# Patient Record
Sex: Male | Born: 1960 | Hispanic: No | Smoking: Never smoker
Health system: Southern US, Community
[De-identification: ages and names within clinical notes are randomized; demographics above are authoritative.]

## PROBLEM LIST (undated history)

## (undated) DIAGNOSIS — I2510 Atherosclerotic heart disease of native coronary artery without angina pectoris: Secondary | ICD-10-CM

## (undated) DIAGNOSIS — G459 Transient cerebral ischemic attack, unspecified: Secondary | ICD-10-CM

## (undated) DIAGNOSIS — H9192 Unspecified hearing loss, left ear: Secondary | ICD-10-CM

## (undated) DIAGNOSIS — G43109 Migraine with aura, not intractable, without status migrainosus: Secondary | ICD-10-CM

## (undated) DIAGNOSIS — R51 Headache: Secondary | ICD-10-CM

## (undated) DIAGNOSIS — K5790 Diverticulosis of intestine, part unspecified, without perforation or abscess without bleeding: Secondary | ICD-10-CM

## (undated) DIAGNOSIS — R002 Palpitations: Secondary | ICD-10-CM

## (undated) DIAGNOSIS — K644 Residual hemorrhoidal skin tags: Secondary | ICD-10-CM

## (undated) DIAGNOSIS — K648 Other hemorrhoids: Secondary | ICD-10-CM

## (undated) DIAGNOSIS — D126 Benign neoplasm of colon, unspecified: Secondary | ICD-10-CM

## (undated) DIAGNOSIS — R519 Headache, unspecified: Secondary | ICD-10-CM

## (undated) DIAGNOSIS — I639 Cerebral infarction, unspecified: Secondary | ICD-10-CM

## (undated) DIAGNOSIS — H539 Unspecified visual disturbance: Secondary | ICD-10-CM

## (undated) DIAGNOSIS — I10 Essential (primary) hypertension: Secondary | ICD-10-CM

## (undated) DIAGNOSIS — Z9289 Personal history of other medical treatment: Secondary | ICD-10-CM

## (undated) HISTORY — DX: Diverticulosis of intestine, part unspecified, without perforation or abscess without bleeding: K57.90

## (undated) HISTORY — DX: Palpitations: R00.2

## (undated) HISTORY — DX: Headache: R51

## (undated) HISTORY — DX: Headache, unspecified: R51.9

## (undated) HISTORY — DX: Residual hemorrhoidal skin tags: K64.4

## (undated) HISTORY — DX: Unspecified visual disturbance: H53.9

## (undated) HISTORY — DX: Benign neoplasm of colon, unspecified: D12.6

## (undated) HISTORY — DX: Personal history of other medical treatment: Z92.89

## (undated) HISTORY — DX: Essential (primary) hypertension: I10

## (undated) HISTORY — DX: Other hemorrhoids: K64.8

## (undated) HISTORY — DX: Cerebral infarction, unspecified: I63.9

---

## 1997-12-29 ENCOUNTER — Emergency Department (HOSPITAL_COMMUNITY): Admission: EM | Admit: 1997-12-29 | Discharge: 1997-12-29 | Payer: Self-pay | Admitting: Emergency Medicine

## 1998-07-17 ENCOUNTER — Encounter: Payer: Self-pay | Admitting: Emergency Medicine

## 1998-07-17 ENCOUNTER — Emergency Department (HOSPITAL_COMMUNITY): Admission: EM | Admit: 1998-07-17 | Discharge: 1998-07-17 | Payer: Self-pay | Admitting: Emergency Medicine

## 1998-07-18 ENCOUNTER — Ambulatory Visit (HOSPITAL_COMMUNITY): Admission: RE | Admit: 1998-07-18 | Discharge: 1998-07-18 | Payer: Self-pay | Admitting: *Deleted

## 1999-04-17 ENCOUNTER — Emergency Department (HOSPITAL_COMMUNITY): Admission: EM | Admit: 1999-04-17 | Discharge: 1999-04-17 | Payer: Self-pay | Admitting: Emergency Medicine

## 1999-04-17 ENCOUNTER — Encounter: Payer: Self-pay | Admitting: Emergency Medicine

## 1999-12-16 ENCOUNTER — Emergency Department (HOSPITAL_COMMUNITY): Admission: EM | Admit: 1999-12-16 | Discharge: 1999-12-16 | Payer: Self-pay | Admitting: Emergency Medicine

## 2001-05-28 ENCOUNTER — Emergency Department (HOSPITAL_COMMUNITY): Admission: EM | Admit: 2001-05-28 | Discharge: 2001-05-29 | Payer: Self-pay | Admitting: Emergency Medicine

## 2001-05-29 ENCOUNTER — Encounter: Payer: Self-pay | Admitting: Emergency Medicine

## 2004-02-21 ENCOUNTER — Emergency Department (HOSPITAL_COMMUNITY): Admission: EM | Admit: 2004-02-21 | Discharge: 2004-02-21 | Payer: Self-pay | Admitting: Family Medicine

## 2004-06-09 ENCOUNTER — Emergency Department (HOSPITAL_COMMUNITY): Admission: EM | Admit: 2004-06-09 | Discharge: 2004-06-09 | Payer: Self-pay | Admitting: Emergency Medicine

## 2005-01-19 ENCOUNTER — Emergency Department (HOSPITAL_COMMUNITY): Admission: EM | Admit: 2005-01-19 | Discharge: 2005-01-19 | Payer: Self-pay | Admitting: Emergency Medicine

## 2005-07-01 ENCOUNTER — Ambulatory Visit: Payer: Self-pay | Admitting: Family Medicine

## 2005-07-02 ENCOUNTER — Ambulatory Visit: Payer: Self-pay | Admitting: *Deleted

## 2005-08-19 ENCOUNTER — Ambulatory Visit (HOSPITAL_COMMUNITY): Admission: RE | Admit: 2005-08-19 | Discharge: 2005-08-19 | Payer: Self-pay | Admitting: Gastroenterology

## 2005-12-06 ENCOUNTER — Emergency Department (HOSPITAL_COMMUNITY): Admission: EM | Admit: 2005-12-06 | Discharge: 2005-12-06 | Payer: Self-pay | Admitting: Emergency Medicine

## 2005-12-21 ENCOUNTER — Emergency Department (HOSPITAL_COMMUNITY): Admission: EM | Admit: 2005-12-21 | Discharge: 2005-12-21 | Payer: Self-pay | Admitting: Emergency Medicine

## 2005-12-30 ENCOUNTER — Emergency Department (HOSPITAL_COMMUNITY): Admission: EM | Admit: 2005-12-30 | Discharge: 2005-12-30 | Payer: Self-pay | Admitting: Emergency Medicine

## 2006-01-05 ENCOUNTER — Ambulatory Visit: Payer: Self-pay | Admitting: Family Medicine

## 2006-11-10 ENCOUNTER — Emergency Department (HOSPITAL_COMMUNITY): Admission: EM | Admit: 2006-11-10 | Discharge: 2006-11-10 | Payer: Self-pay | Admitting: Emergency Medicine

## 2006-12-09 ENCOUNTER — Encounter (INDEPENDENT_AMBULATORY_CARE_PROVIDER_SITE_OTHER): Payer: Self-pay | Admitting: *Deleted

## 2007-02-08 ENCOUNTER — Emergency Department (HOSPITAL_COMMUNITY): Admission: EM | Admit: 2007-02-08 | Discharge: 2007-02-08 | Payer: Self-pay | Admitting: Emergency Medicine

## 2007-02-10 ENCOUNTER — Emergency Department (HOSPITAL_COMMUNITY): Admission: EM | Admit: 2007-02-10 | Discharge: 2007-02-10 | Payer: Self-pay | Admitting: Family Medicine

## 2007-02-14 ENCOUNTER — Emergency Department (HOSPITAL_COMMUNITY): Admission: EM | Admit: 2007-02-14 | Discharge: 2007-02-14 | Payer: Self-pay | Admitting: Emergency Medicine

## 2007-02-17 ENCOUNTER — Emergency Department (HOSPITAL_COMMUNITY): Admission: EM | Admit: 2007-02-17 | Discharge: 2007-02-17 | Payer: Self-pay | Admitting: Emergency Medicine

## 2008-02-23 ENCOUNTER — Emergency Department (HOSPITAL_COMMUNITY): Admission: EM | Admit: 2008-02-23 | Discharge: 2008-02-24 | Payer: Self-pay | Admitting: Emergency Medicine

## 2008-06-08 ENCOUNTER — Emergency Department (HOSPITAL_COMMUNITY): Admission: EM | Admit: 2008-06-08 | Discharge: 2008-06-08 | Payer: Self-pay | Admitting: Emergency Medicine

## 2009-05-08 ENCOUNTER — Emergency Department (HOSPITAL_COMMUNITY): Admission: EM | Admit: 2009-05-08 | Discharge: 2009-05-08 | Payer: Self-pay | Admitting: Emergency Medicine

## 2009-12-02 ENCOUNTER — Emergency Department (HOSPITAL_COMMUNITY): Admission: EM | Admit: 2009-12-02 | Discharge: 2009-12-02 | Payer: Self-pay | Admitting: Emergency Medicine

## 2010-06-14 LAB — URINALYSIS, ROUTINE W REFLEX MICROSCOPIC
Bilirubin Urine: NEGATIVE
Glucose, UA: NEGATIVE mg/dL
Hgb urine dipstick: NEGATIVE
Ketones, ur: NEGATIVE mg/dL
Nitrite: NEGATIVE
Protein, ur: NEGATIVE mg/dL
Specific Gravity, Urine: 1.03 (ref 1.005–1.030)
Urobilinogen, UA: 0.2 mg/dL (ref 0.0–1.0)
pH: 5.5 (ref 5.0–8.0)

## 2010-06-14 LAB — GC/CHLAMYDIA PROBE AMP, GENITAL
Chlamydia, DNA Probe: NEGATIVE
GC Probe Amp, Genital: NEGATIVE

## 2010-06-14 LAB — RPR: RPR Ser Ql: NONREACTIVE

## 2010-07-04 LAB — CBC
HCT: 44.2 % (ref 39.0–52.0)
Hemoglobin: 15.5 g/dL (ref 13.0–17.0)
MCHC: 35.1 g/dL (ref 30.0–36.0)
MCV: 88 fL (ref 78.0–100.0)
Platelets: 233 10*3/uL (ref 150–400)
RBC: 5.02 MIL/uL (ref 4.22–5.81)
RDW: 12.5 % (ref 11.5–15.5)
WBC: 9.2 10*3/uL (ref 4.0–10.5)

## 2010-07-04 LAB — POCT I-STAT, CHEM 8
BUN: 19 mg/dL (ref 6–23)
Calcium, Ion: 1.11 mmol/L — ABNORMAL LOW (ref 1.12–1.32)
Chloride: 106 mEq/L (ref 96–112)
Creatinine, Ser: 1.1 mg/dL (ref 0.4–1.5)
Glucose, Bld: 108 mg/dL — ABNORMAL HIGH (ref 70–99)
HCT: 47 % (ref 39.0–52.0)
Hemoglobin: 16 g/dL (ref 13.0–17.0)
Potassium: 3.8 mEq/L (ref 3.5–5.1)
Sodium: 141 mEq/L (ref 135–145)
TCO2: 26 mmol/L (ref 0–100)

## 2010-07-04 LAB — DIFFERENTIAL
Basophils Absolute: 0 10*3/uL (ref 0.0–0.1)
Basophils Relative: 0 % (ref 0–1)
Eosinophils Absolute: 0.1 10*3/uL (ref 0.0–0.7)
Eosinophils Relative: 2 % (ref 0–5)
Lymphocytes Relative: 27 % (ref 12–46)
Lymphs Abs: 2.4 10*3/uL (ref 0.7–4.0)
Monocytes Absolute: 0.7 10*3/uL (ref 0.1–1.0)
Monocytes Relative: 7 % (ref 3–12)
Neutro Abs: 5.9 10*3/uL (ref 1.7–7.7)
Neutrophils Relative %: 64 % (ref 43–77)

## 2010-07-04 LAB — POCT CARDIAC MARKERS
CKMB, poc: <1 ng/mL — ABNORMAL LOW (ref 1.0–8.0)
Myoglobin, poc: 76.9 ng/mL (ref 12–200)
Troponin i, poc: <0.05 ng/mL (ref 0.00–0.09)

## 2010-08-09 NOTE — Op Note (Signed)
NAME:  Todd [name], Todd [name]                 ACCOUNT NO.:  [id number]   MEDICAL RECORD NO.:  [id number]          PATIENT TYPE:  AMB   LOCATION:  ENDO                         FACILITY:  [facility]   PHYSICIAN:  Graylin Shiver, M.D.   DATE OF BIRTH:  June 05, 1960   DATE OF PROCEDURE:  08/19/2005  DATE OF DISCHARGE:                                 OPERATIVE REPORT   PROCEDURE:  Colonoscopy   INDICATIONS FOR PROCEDURE:  Rectal bleeding.   INFORMED CONSENT:  Obtained after explanation of the risks of bleeding,  infection, and perforation.   PREMEDICATION:  Fentanyl 80 mcg IV, Versed 9 mg IV.   DESCRIPTION OF PROCEDURE:  With the patient in the left lateral decubitus  position a rectal exam was performed.  No masses were felt in the rectal  vault.  There were some external hemorrhoids seen.  The Olympus colonoscope  was inserted into the rectum, and advanced around the colon to the cecum.  Cecal landmarks were identified.  The cecum and ascending colon were normal.  The transverse colon normal.  The descending colon, sigmoid, and rectum were  normal.  The scope was retroflexed in the rectum and no abnormalities were  seen.  The scope was straightened and brought out.  He tolerated the  procedure well without complications.   IMPRESSION:  Normal colonoscopy to the cecum with external hemorrhoids  present, which I believe are the source of the patient's rectal bleeding   Will recommend Metamucil and an Anusol, or some other over-the-counter cream  as needed for hemorrhoids.  Should the problem continue that the bleeding  occurs; and is to troublesome for the patient; he will most likely need to  see a Careers adviser.           ______________________________  Graylin Shiver, M.D.     SFG/MEDQ  D:  08/19/2005  T:  08/19/2005  Job:  [id number]   cc:   Maurice March, M.D.  Fax: [phone]

## 2011-01-03 LAB — POCT CARDIAC MARKERS
CKMB, poc: <1 — ABNORMAL LOW
Myoglobin, poc: 55.6
Operator id: 279831
Troponin i, poc: <0.05

## 2011-07-01 ENCOUNTER — Emergency Department (HOSPITAL_COMMUNITY)
Admission: EM | Admit: 2011-07-01 | Discharge: 2011-07-01 | Disposition: A | Discharge status: Home / Self Care | Payer: Self-pay | Attending: Emergency Medicine | Admitting: Emergency Medicine

## 2011-07-01 ENCOUNTER — Encounter (HOSPITAL_COMMUNITY): Payer: Self-pay | Admitting: Emergency Medicine

## 2011-07-01 DIAGNOSIS — J040 Acute laryngitis: Secondary | ICD-10-CM

## 2011-07-01 LAB — RAPID STREP SCREEN (MED CTR MEBANE ONLY): Streptococcus, Group A Screen (Direct): NEGATIVE

## 2011-07-01 MED ORDER — HYDROCODONE-ACETAMINOPHEN 7.5-325 MG/15ML PO SOLN: 15.0000 mL | Freq: Three times a day (TID) | ORAL | Status: AC | PRN

## 2011-07-01 MED ORDER — OXYMETAZOLINE HCL 0.05 % NA SOLN
1.0000 | Freq: Once | NASAL | Status: AC
  Administered 2011-07-01: 1 via NASAL
  Filled 2011-07-01: qty 15

## 2011-07-01 NOTE — ED Notes (Signed)
Sore throat X3 days.  Denies fever, N/V.  Pt states he had a nosebleed on rt side this morning but bleeding is now controlled.  Dried blood evident in rt nare.  Pt alert oriented X4.

## 2011-07-01 NOTE — ED Notes (Signed)
Pt presents w/ 1-2days sore throat, hoarseness and this morning began having a right nare epistaxis - bleeding controlled at present, pt denies any known fever. Pt in no acute distress, resting comfortably on bed.

## 2011-07-01 NOTE — ED Notes (Signed)
Pt resting, no needs at this time.

## 2011-07-01 NOTE — ED Provider Notes (Signed)
History     CSN: [id number]  Arrival date & time 07/01/11  0453   First MD Initiated Contact with Patient 07/01/11 [phone]      Chief Complaint  Patient presents with  . Sore Throat    (Consider location/radiation/quality/duration/timing/severity/associated sxs/prior treatment) HPI Comments: 51 year old male with no significant past medical history presents with sore throat and a hoarse voice with a runny nose and chills that started yesterday. Symptoms are persistent, moderate, gradually getting worse, not associated with fevers, cough, chest pain, abdominal pain, headache, ear pain.  Patient is a 51 y.o. male presenting with pharyngitis. The history is provided by the patient.  Sore Throat    History reviewed. No pertinent past medical history.  No past surgical history on file.  No family history on file.  History  Substance Use Topics  . Smoking status: Never Smoker   . Smokeless tobacco: Not on file  . Alcohol Use: No      Review of Systems  All other systems reviewed and are negative.    Allergies  Wellbutrin  Home Medications   Current Outpatient Rx  Name Route Sig Dispense Refill  . MENTHOL 3 MG MT LOZG Oral Take 1 lozenge by mouth every 2 (two) hours as needed. For sore throat    . HYDROCODONE-ACETAMINOPHEN 7.5-325 MG/15ML PO SOLN Oral Take 15 mLs by mouth every 8 (eight) hours as needed for pain. 120 mL 0    BP 111/62  Pulse 80  Temp(Src) 99.3 F (37.4 C) (Oral)  Resp 14  SpO2 96%  Physical Exam  Nursing note and vitals reviewed. Constitutional: He appears well-developed and well-nourished. No distress.  HENT:  Head: Normocephalic and atraumatic.  Mouth/Throat: Oropharynx is clear and moist. No oropharyngeal exudate.       Oropharynx is clear, mild erythema but no exudate hypertrophy or asymmetry. Tympanic membranes normal bilaterally, bilateral nares with no signs of bleeding  Eyes: Conjunctivae and EOM are normal. Pupils are equal, round, and  reactive to light. Right eye exhibits no discharge. Left eye exhibits no discharge. No scleral icterus.  Neck: Normal range of motion. Neck supple. No JVD present. No thyromegaly present.  Cardiovascular: Normal rate, regular rhythm, normal heart sounds and intact distal pulses.  Exam reveals no gallop and no friction rub.   No murmur heard. Pulmonary/Chest: Effort normal and breath sounds normal. No respiratory distress. He has no wheezes. He has no rales.  Abdominal: Soft. Bowel sounds are normal. He exhibits no distension and no mass. There is no tenderness.  Musculoskeletal: Normal range of motion. He exhibits no edema and no tenderness.  Lymphadenopathy:    He has no cervical adenopathy.  Neurological: He is alert. Coordination normal.  Skin: Skin is warm and dry. No rash noted. No erythema.  Psychiatric: He has a normal mood and affect. His behavior is normal.    ED Course  Procedures (including critical care time)   Labs Reviewed  RAPID STREP SCREEN   No results found.   1. Laryngitis       MDM  Patient is a hoarse voice but no other signs of respiratory distress, normal vital signs, strep test sent, anticipate this is likely uncomplicated laryngitis, patient well appearing and amenable to discharge.   Strep neg  Discharge Prescriptions include:  Hydrocodone suspension  Vida Roller, MD 07/01/11 [phone]

## 2011-07-01 NOTE — ED Notes (Signed)
PT. REPORTS SORE THROAT / HOARSE VOICE WITH RUNNY NOSE ( BLOODY MUCUS) AND CHILLS ONSET YESTERDAY.

## 2011-07-05 ENCOUNTER — Ambulatory Visit: Payer: Self-pay | Admitting: Physician Assistant

## 2011-07-05 VITALS — BP 105/72 | HR 88 | Temp 98.4°F | Resp 16 | Ht 65.0 in | Wt 157.0 lb

## 2011-07-05 DIAGNOSIS — R05 Cough: Secondary | ICD-10-CM

## 2011-07-05 DIAGNOSIS — R059 Cough, unspecified: Secondary | ICD-10-CM

## 2011-07-05 MED ORDER — AZITHROMYCIN 250 MG PO TABS: ORAL_TABLET | ORAL | Status: AC

## 2011-07-05 NOTE — Progress Notes (Signed)
  Subjective:    Patient ID: Todd [name], male    DOB: 1960-09-10, 51 y.o.   MRN: [id number]  HPI  51yo male c/o ST X1 week ago, went to ER, negative strep test.  He was given pain meds.  The throat has improved, but he has developed a cough over the last few days which seems to be worsening.  No OTCs. Coughed a;; night last night.  Phlegm is yellow.    Review of Systems  All other systems reviewed and are negative.       Objective:   Physical Exam  Nursing note and vitals reviewed. Constitutional: He is oriented to person, place, and time. He appears well-developed and well-nourished.       ER note reviewed.  HENT:  Head: Normocephalic and atraumatic.  Right Ear: External ear normal.  Left Ear: External ear normal.  Mouth/Throat: Oropharynx is clear and moist. No oropharyngeal exudate (throat w PND and minimal erythema).  Neck: Normal range of motion. Neck supple.  Cardiovascular: Normal rate, regular rhythm and normal heart sounds.  Exam reveals no gallop and no friction rub.   No murmur heard. Pulmonary/Chest: Effort normal and breath sounds normal.  Lymphadenopathy:    He has no cervical adenopathy.  Neurological: He is alert and oriented to person, place, and time. No cranial nerve deficit.  Skin: Skin is warm and dry.      Assessment & Plan:  URI-?worsening.  Advised mucinex DM. Sent Zpack, wait 48 hrs.

## 2011-07-05 NOTE — Patient Instructions (Signed)
Fluids, rest and mucinex DM.

## 2011-09-11 ENCOUNTER — Ambulatory Visit: Payer: Self-pay

## 2011-09-14 ENCOUNTER — Ambulatory Visit (INDEPENDENT_AMBULATORY_CARE_PROVIDER_SITE_OTHER): Payer: BC Managed Care – PPO | Admitting: Emergency Medicine

## 2011-09-14 VITALS — BP 114/79 | HR 79 | Temp 98.4°F | Resp 16 | Ht 65.0 in | Wt 163.0 lb

## 2011-09-14 DIAGNOSIS — R002 Palpitations: Secondary | ICD-10-CM

## 2011-09-14 NOTE — Progress Notes (Signed)
  Subjective:    Patient ID: Todd [name], male    DOB: 16-Feb-1961, 51 y.o.   MRN: [id number]  HPI patient was seen here 3 years ago at that time he was having trouble with heart palpitations. Because of this he had a cardiac evaluation. He ended up having a stress Cardiolite which was normal. Had an echocardiogram which was normal. His tests were all normal and patient was told at that time he did not have any signs of cardiac disease. Since then he has done fine. He occasionally has a skipped beat but otherwise has been doing well. He denies any chest pain or shortness of breath. He is trying to get life insurance but is unable to because of his history of cardiac problems. He does not take a stimulant meds. He does not smoke or drink and has a healthy lifestyle to    Review of Systems     Objective:   Physical Exam HEENT exam is within normal limits the his neck is supple. His chest is clear to auscultation and percussion abdomen is soft and nontender.   EKG  NSR     Assessment & Plan:  Patient seen today EKG done shows no change from previous. He was evaluated 3 years ago at that time had no signs of cardiac disease

## 2011-09-14 NOTE — Patient Instructions (Signed)
Patient advised that some time due to his age he should schedule a routine physical exam. On his cardiac evaluation 3 years ago he  showed no signs of any cardiac disease.

## 2011-09-21 ENCOUNTER — Telehealth: Payer: Self-pay

## 2011-09-21 NOTE — Telephone Encounter (Signed)
LMOM to CB. 

## 2011-09-21 NOTE — Telephone Encounter (Signed)
Spoke with patient and was notified that EKG from past was the same as recent EKG.  Patient understood.

## 2011-09-21 NOTE — Telephone Encounter (Signed)
PT WANTS A COMPARISON OF HIS RECENT TESTS PERFORMED AT THE HOSPITAL.  HE ALSO, HAD THIS DONE THREE YEARS AGO.  PLEASE CALL [phone]

## 2011-09-24 ENCOUNTER — Telehealth: Payer: Self-pay

## 2011-09-24 NOTE — Telephone Encounter (Signed)
Pt is calling and would like to speak with someone concerning some test that were done we referred him to a heart specialist [phone]

## 2011-09-24 NOTE — Telephone Encounter (Signed)
Patient called wanting a result of his tests that he had done at the heart doctor "echocardiogram" .  I do not see it in his chart.  Have you received these results yet and if so, can we send him a copy?

## 2011-09-25 NOTE — Telephone Encounter (Signed)
Spoke w/pt and advised him that the cardiologist office where the procedure was performed should be in touch w/him w/results and suggested that pt call them and ask them for the results and if any f/up is needed, and for a copy of the report. Pt agreed to talk w/them tomorrow when they are open.

## 2011-09-25 NOTE — Telephone Encounter (Signed)
He needs to call the cardiologist where he had his procedure performed. Ask him to send me a copy of the echocardiogram and also have the patient request a copy be sent to him.

## 2011-10-12 ENCOUNTER — Ambulatory Visit (INDEPENDENT_AMBULATORY_CARE_PROVIDER_SITE_OTHER): Payer: BC Managed Care – PPO | Admitting: Family Medicine

## 2011-10-12 VITALS — BP 112/70 | HR 87 | Temp 98.6°F | Resp 12 | Ht 64.0 in | Wt 162.0 lb

## 2011-10-12 DIAGNOSIS — N469 Male infertility, unspecified: Secondary | ICD-10-CM

## 2011-10-12 NOTE — Progress Notes (Signed)
  [facility]:  Office Visit  Chief Complaint:  Chief Complaint  Patient presents with  . needs referral    HPI: Todd [name] is a 51 y.o. male who complains of trying  to have children for last 2 years. He denies having had any STDs, family history of infertility ( brothers all have multiple children). He went to [facility] ? And was evaluated for this and was told his "----was 0" but patient does not know what lab was zero. His wife is currently out of the country for the next 2 months. She has never been tested or evaluated for infertility issues.   History reviewed. No pertinent past medical history. History reviewed. No pertinent past surgical history. History   Social History  . Marital Status: Legally Separated    Spouse Name: N/A    Number of Children: N/A  . Years of Education: N/A   Social History Main Topics  . Smoking status: Never Smoker   . Smokeless tobacco: None  . Alcohol Use: No  . Drug Use: No  . Sexually Active: None   Other Topics Concern  . None   Social History Narrative  . None   Family History  Problem Relation Age of Onset  . Hypertension Mother   . Hypertension Father    Allergies  Allergen Reactions  . Wellbutrin (Bupropion Hcl) Itching   Prior to Admission medications   Medication Sig Start Date End Date Taking? Authorizing Provider  menthol-cetylpyridinium (CEPACOL) 3 MG lozenge Take 1 lozenge by mouth every 2 (two) hours as needed. For sore throat    Historical Provider, MD     ROS: The patient denies fevers, chills, night sweats, unintentional weight loss, chest pain, palpitations, wheezing, dyspnea on exertion, nausea, vomiting, abdominal pain, dysuria, hematuria, melena, numbness, weakness, or tingling.  All other systems have been reviewed and were otherwise negative with the exception of those mentioned in the HPI and as above.    PHYSICAL EXAM: Filed Vitals:   10/12/11 1427  BP: 112/70  Pulse: 87    Temp: 98.6 F (37 C)  Resp: 12   Filed Vitals:   10/12/11 1427  Height: 5\' 4"  (1.626 m)  Weight: 162 lb (73.483 kg)   Body mass index is 27.81 kg/(m^2).  General: Alert, no acute distress HEENT:  Normocephalic, atraumatic, oropharynx patent.  Cardiovascular:  Regular rate and rhythm,  No pedal edema.  Respiratory: Clear to auscultation bilaterally.   No cyanosis, no use of accessory musculature GI: No organomegaly, abdomen is soft and non-tender, positive bowel sounds.  No masses. Skin: No rashes. Neurologic: Facial musculature symmetric. Psychiatric: Patient is appropriate throughout our interaction. Lymphatic: No cervical lymphadenopathy Musculoskeletal: Gait intact.   LABS: Results for orders placed during the hospital encounter of 07/01/11  RAPID STREP SCREEN      Component Value Range   Streptococcus, Group A Screen (Direct) NEGATIVE  NEGATIVE     EKG/XRAY:   Primary read interpreted by Dr. Conley Rolls at [facility].   ASSESSMENT/PLAN: Encounter Diagnosis  Name Primary?  . 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 Patient advise to bring all labs that he had gotten done at [facility] to his appt.     Jenayah Antu PHUONG, DO 10/12/2011 3:13 PM

## 2011-12-01 ENCOUNTER — Encounter (HOSPITAL_COMMUNITY): Payer: Self-pay

## 2011-12-01 ENCOUNTER — Observation Stay (HOSPITAL_COMMUNITY)
Admission: EM | Admit: 2011-12-01 | Discharge: 2011-12-03 | Disposition: A | Discharge status: Home / Self Care | Payer: BC Managed Care – PPO | Attending: General Surgery | Admitting: General Surgery

## 2011-12-01 DIAGNOSIS — K3580 Unspecified acute appendicitis: Secondary | ICD-10-CM

## 2011-12-01 DIAGNOSIS — R1031 Right lower quadrant pain: Principal | ICD-10-CM | POA: Insufficient documentation

## 2011-12-01 HISTORY — PX: APPENDECTOMY: SHX54

## 2011-12-01 LAB — URINALYSIS, ROUTINE W REFLEX MICROSCOPIC
Bilirubin Urine: NEGATIVE
Glucose, UA: NEGATIVE mg/dL
Hgb urine dipstick: NEGATIVE
Leukocytes, UA: NEGATIVE
Nitrite: NEGATIVE
Protein, ur: NEGATIVE mg/dL
Specific Gravity, Urine: 1.031 — ABNORMAL HIGH (ref 1.005–1.030)
Urobilinogen, UA: 0.2 mg/dL (ref 0.0–1.0)
pH: 5.5 (ref 5.0–8.0)

## 2011-12-01 LAB — CBC WITH DIFFERENTIAL/PLATELET
Basophils Absolute: 0 10*3/uL (ref 0.0–0.1)
Basophils Relative: 0 % (ref 0–1)
Eosinophils Absolute: 0.2 10*3/uL (ref 0.0–0.7)
Eosinophils Relative: 2 % (ref 0–5)
HCT: 42.5 % (ref 39.0–52.0)
Hemoglobin: 15.1 g/dL (ref 13.0–17.0)
Lymphocytes Relative: 20 % (ref 12–46)
Lymphs Abs: 2.3 10*3/uL (ref 0.7–4.0)
MCH: 30 pg (ref 26.0–34.0)
MCHC: 35.5 g/dL (ref 30.0–36.0)
MCV: 84.3 fL (ref 78.0–100.0)
Monocytes Absolute: 0.7 10*3/uL (ref 0.1–1.0)
Monocytes Relative: 6 % (ref 3–12)
Neutro Abs: 8.2 10*3/uL — ABNORMAL HIGH (ref 1.7–7.7)
Neutrophils Relative %: 72 % (ref 43–77)
Platelets: 212 10*3/uL (ref 150–400)
RBC: 5.04 MIL/uL (ref 4.22–5.81)
RDW: 12.7 % (ref 11.5–15.5)
WBC: 11.4 10*3/uL — ABNORMAL HIGH (ref 4.0–10.5)

## 2011-12-01 NOTE — ED Notes (Signed)
Pt states he ate a stuffed green pepper for dinner last night and states he went to bed and awoke at 0400 this AM with moderate progressive mid abdominal pain-states 3 loose stools today.  Pt is pale and states the pain is "making me weak"-pt states he ate 5 ounces of meat today and a piece of bread.  Pt states his wife gave him some oregano water for his stomach which did not help the pain.

## 2011-12-02 ENCOUNTER — Encounter (HOSPITAL_COMMUNITY): Admission: EM | Disposition: A | Discharge status: Home / Self Care | Payer: Self-pay | Source: Home / Self Care

## 2011-12-02 ENCOUNTER — Encounter (HOSPITAL_COMMUNITY): Payer: Self-pay | Admitting: Registered Nurse

## 2011-12-02 ENCOUNTER — Emergency Department (HOSPITAL_COMMUNITY): Payer: BC Managed Care – PPO

## 2011-12-02 ENCOUNTER — Emergency Department (HOSPITAL_COMMUNITY): Payer: BC Managed Care – PPO | Admitting: Registered Nurse

## 2011-12-02 ENCOUNTER — Encounter (HOSPITAL_COMMUNITY): Payer: Self-pay

## 2011-12-02 DIAGNOSIS — K3580 Unspecified acute appendicitis: Secondary | ICD-10-CM

## 2011-12-02 HISTORY — PX: LAPAROSCOPIC APPENDECTOMY: SHX408

## 2011-12-02 LAB — COMPREHENSIVE METABOLIC PANEL
ALT: 16 U/L (ref 0–53)
AST: 19 U/L (ref 0–37)
Albumin: 4.2 g/dL (ref 3.5–5.2)
Alkaline Phosphatase: 53 U/L (ref 39–117)
BUN: 12 mg/dL (ref 6–23)
CO2: 29 mEq/L (ref 19–32)
Calcium: 9.6 mg/dL (ref 8.4–10.5)
Chloride: 103 mEq/L (ref 96–112)
Creatinine, Ser: 0.98 mg/dL (ref 0.50–1.35)
GFR calc Af Amer: >90 mL/min (ref >90)
GFR calc non Af Amer: >90 mL/min (ref >90)
Glucose, Bld: 97 mg/dL (ref 70–99)
Potassium: 4.1 mEq/L (ref 3.5–5.1)
Sodium: 138 mEq/L (ref 135–145)
Total Bilirubin: 1.4 mg/dL — ABNORMAL HIGH (ref 0.3–1.2)
Total Protein: 7.2 g/dL (ref 6.0–8.3)

## 2011-12-02 LAB — LIPASE, BLOOD: Lipase: 26 U/L (ref 11–59)

## 2011-12-02 SURGERY — APPENDECTOMY, LAPAROSCOPIC
Anesthesia: General | Site: Abdomen | Wound class: Contaminated

## 2011-12-02 MED ORDER — BUPIVACAINE HCL 0.25 % IJ SOLN
INTRAMUSCULAR | Status: DC | PRN
  Administered 2011-12-02: 15 mL

## 2011-12-02 MED ORDER — LIDOCAINE HCL (CARDIAC) 20 MG/ML IV SOLN
INTRAVENOUS | Status: DC | PRN
  Administered 2011-12-02: 50 mg via INTRAVENOUS

## 2011-12-02 MED ORDER — BUPIVACAINE HCL (PF) 0.25 % IJ SOLN
INTRAMUSCULAR | Status: AC
  Filled 2011-12-02: qty 30

## 2011-12-02 MED ORDER — KETOROLAC TROMETHAMINE 30 MG/ML IJ SOLN: 15.0000 mg | Freq: Once | INTRAMUSCULAR | Status: DC | PRN

## 2011-12-02 MED ORDER — ONDANSETRON HCL 4 MG/2ML IJ SOLN
4.0000 mg | Freq: Once | INTRAMUSCULAR | Status: AC
  Administered 2011-12-02: 4 mg via INTRAVENOUS
  Filled 2011-12-02: qty 2

## 2011-12-02 MED ORDER — HYDROMORPHONE HCL PF 1 MG/ML IJ SOLN
0.2500 mg | INTRAMUSCULAR | Status: DC | PRN
  Administered 2011-12-02 (×2): 0.5 mg via INTRAVENOUS

## 2011-12-02 MED ORDER — MIDAZOLAM HCL 5 MG/5ML IJ SOLN
INTRAMUSCULAR | Status: DC | PRN
  Administered 2011-12-02: 2 mg via INTRAVENOUS

## 2011-12-02 MED ORDER — LACTATED RINGERS IR SOLN
Status: DC | PRN
  Administered 2011-12-02: 100 mL

## 2011-12-02 MED ORDER — NEOSTIGMINE METHYLSULFATE 1 MG/ML IJ SOLN
INTRAMUSCULAR | Status: DC | PRN
  Administered 2011-12-02: 3 mg via INTRAVENOUS

## 2011-12-02 MED ORDER — MORPHINE SULFATE 4 MG/ML IJ SOLN
4.0000 mg | Freq: Once | INTRAMUSCULAR | Status: DC
  Filled 2011-12-02: qty 1

## 2011-12-02 MED ORDER — SUFENTANIL CITRATE 50 MCG/ML IV SOLN
INTRAVENOUS | Status: DC | PRN
  Administered 2011-12-02: 10 ug via INTRAVENOUS

## 2011-12-02 MED ORDER — SODIUM CHLORIDE 0.9 % IV BOLUS (SEPSIS)
1000.0000 mL | Freq: Once | INTRAVENOUS | Status: AC
  Administered 2011-12-02: 1000 mL via INTRAVENOUS

## 2011-12-02 MED ORDER — MEPERIDINE HCL 25 MG/ML IJ SOLN
INTRAMUSCULAR | Status: DC | PRN
  Administered 2011-12-02: 25 mg via INTRAVENOUS

## 2011-12-02 MED ORDER — HYDROCODONE-ACETAMINOPHEN 5-325 MG PO TABS
1.0000 | ORAL_TABLET | ORAL | Status: DC | PRN
  Administered 2011-12-03: 2 via ORAL
  Filled 2011-12-02: qty 2

## 2011-12-02 MED ORDER — SUCCINYLCHOLINE CHLORIDE 20 MG/ML IJ SOLN
INTRAMUSCULAR | Status: DC | PRN
  Administered 2011-12-02: 100 mg via INTRAVENOUS

## 2011-12-02 MED ORDER — GLYCOPYRROLATE 0.2 MG/ML IJ SOLN
INTRAMUSCULAR | Status: DC | PRN
  Administered 2011-12-02: 0.4 mg via INTRAVENOUS

## 2011-12-02 MED ORDER — MORPHINE SULFATE 4 MG/ML IJ SOLN
4.0000 mg | Freq: Once | INTRAMUSCULAR | Status: AC
  Administered 2011-12-02: 4 mg via INTRAVENOUS
  Filled 2011-12-02: qty 1

## 2011-12-02 MED ORDER — LACTATED RINGERS IV SOLN
INTRAVENOUS | Status: DC | PRN
  Administered 2011-12-02 (×2): via INTRAVENOUS

## 2011-12-02 MED ORDER — HYDROMORPHONE HCL PF 1 MG/ML IJ SOLN
INTRAMUSCULAR | Status: AC
  Filled 2011-12-02: qty 1

## 2011-12-02 MED ORDER — IOHEXOL 300 MG/ML  SOLN
100.0000 mL | Freq: Once | INTRAMUSCULAR | Status: AC | PRN
  Administered 2011-12-02: 100 mL via INTRAVENOUS

## 2011-12-02 MED ORDER — ONDANSETRON HCL 4 MG/2ML IJ SOLN: 4.0000 mg | Freq: Once | INTRAMUSCULAR | Status: DC

## 2011-12-02 MED ORDER — SODIUM CHLORIDE 0.9 % IV SOLN
1.0000 g | Freq: Once | INTRAVENOUS | Status: AC
  Administered 2011-12-02: 1 g via INTRAVENOUS
  Filled 2011-12-02: qty 1

## 2011-12-02 MED ORDER — ENOXAPARIN SODIUM 40 MG/0.4ML ~~LOC~~ SOLN
40.0000 mg | SUBCUTANEOUS | Status: DC
  Administered 2011-12-02 – 2011-12-03 (×2): 40 mg via SUBCUTANEOUS
  Filled 2011-12-02 (×2): qty 0.4

## 2011-12-02 MED ORDER — ONDANSETRON HCL 4 MG PO TABS: 4.0000 mg | ORAL_TABLET | Freq: Four times a day (QID) | ORAL | Status: DC | PRN

## 2011-12-02 MED ORDER — DEXAMETHASONE SODIUM PHOSPHATE 10 MG/ML IJ SOLN
INTRAMUSCULAR | Status: DC | PRN
  Administered 2011-12-02: 10 mg via INTRAVENOUS

## 2011-12-02 MED ORDER — LIDOCAINE-EPINEPHRINE 1 %-1:100000 IJ SOLN
INTRAMUSCULAR | Status: DC | PRN
  Administered 2011-12-02: 15 mL

## 2011-12-02 MED ORDER — MEPERIDINE HCL 50 MG/ML IJ SOLN
INTRAMUSCULAR | Status: AC
  Filled 2011-12-02: qty 1

## 2011-12-02 MED ORDER — ONDANSETRON HCL 4 MG/2ML IJ SOLN: 4.0000 mg | Freq: Four times a day (QID) | INTRAMUSCULAR | Status: DC | PRN

## 2011-12-02 MED ORDER — ONDANSETRON HCL 4 MG/2ML IJ SOLN
INTRAMUSCULAR | Status: DC | PRN
  Administered 2011-12-02: 4 mg via INTRAVENOUS

## 2011-12-02 MED ORDER — LIDOCAINE-EPINEPHRINE 1 %-1:100000 IJ SOLN
INTRAMUSCULAR | Status: AC
Start: 2011-12-02 — End: 2011-12-02
  Filled 2011-12-02: qty 1

## 2011-12-02 MED ORDER — KCL IN DEXTROSE-NACL 20-5-0.45 MEQ/L-%-% IV SOLN
INTRAVENOUS | Status: DC
  Administered 2011-12-02 – 2011-12-03 (×3): via INTRAVENOUS
  Filled 2011-12-02 (×4): qty 1000

## 2011-12-02 MED ORDER — MENTHOL 3 MG MT LOZG
1.0000 | LOZENGE | OROMUCOSAL | Status: DC | PRN
  Administered 2011-12-03: 3 mg via ORAL

## 2011-12-02 MED ORDER — PROPOFOL 10 MG/ML IV BOLUS
INTRAVENOUS | Status: DC | PRN
  Administered 2011-12-02: 150 mg via INTRAVENOUS
  Administered 2011-12-02: 40 mg via INTRAVENOUS

## 2011-12-02 MED ORDER — PHENYLEPHRINE HCL 10 MG/ML IJ SOLN
INTRAMUSCULAR | Status: DC | PRN
  Administered 2011-12-02: 80 ug via INTRAVENOUS

## 2011-12-02 MED ORDER — MORPHINE SULFATE 2 MG/ML IJ SOLN
2.0000 mg | INTRAMUSCULAR | Status: DC | PRN
  Administered 2011-12-02 (×2): 2 mg via INTRAVENOUS
  Filled 2011-12-02 (×2): qty 1

## 2011-12-02 MED ORDER — PROMETHAZINE HCL 25 MG/ML IJ SOLN: 6.2500 mg | INTRAMUSCULAR | Status: DC | PRN

## 2011-12-02 SURGICAL SUPPLY — 41 items
CABLE HIGH FREQUENCY MONO STRZ (ELECTRODE) IMPLANT
CANISTER SUCTION 2500CC (MISCELLANEOUS) ×2 IMPLANT
CHLORAPREP W/TINT 26ML (MISCELLANEOUS) ×2 IMPLANT
CLOTH BEACON ORANGE TIMEOUT ST (SAFETY) ×2 IMPLANT
COVER SURGICAL LIGHT HANDLE (MISCELLANEOUS) ×2 IMPLANT
DECANTER SPIKE VIAL GLASS SM (MISCELLANEOUS) ×4 IMPLANT
DERMABOND ADVANCED (GAUZE/BANDAGES/DRESSINGS) ×1
DERMABOND ADVANCED .7 DNX12 (GAUZE/BANDAGES/DRESSINGS) ×1 IMPLANT
DRAPE LAPAROSCOPIC ABDOMINAL (DRAPES) ×2 IMPLANT
DRAPE UTILITY 15X26 (DRAPE) ×2 IMPLANT
ELECT CAUTERY BLADE 6.4 (BLADE) ×2 IMPLANT
ELECT REM PT RETURN 9FT ADLT (ELECTROSURGICAL) ×2
ELECTRODE REM PT RTRN 9FT ADLT (ELECTROSURGICAL) ×1 IMPLANT
ENDO GIA UNIVERSAL XLG (ENDOMECHANICALS) ×2 IMPLANT
FILTER SMOKE EVAC LAPAROSHD (FILTER) IMPLANT
GLOVE BIOGEL PI IND STRL 7.0 (GLOVE) ×1 IMPLANT
GLOVE BIOGEL PI INDICATOR 7.0 (GLOVE) ×1
GLOVE SURG SS PI 7.5 STRL IVOR (GLOVE) ×6 IMPLANT
GOWN PREVENTION PLUS LG XLONG (DISPOSABLE) ×2 IMPLANT
GOWN STRL NON-REIN LRG LVL3 (GOWN DISPOSABLE) ×2 IMPLANT
GOWN STRL REIN XL XLG (GOWN DISPOSABLE) ×4 IMPLANT
GRASPER LAPSCPC 5X35 EPIX (ENDOMECHANICALS) IMPLANT
KIT BASIN OR (CUSTOM PROCEDURE TRAY) ×2 IMPLANT
NS IRRIG 1000ML POUR BTL (IV SOLUTION) IMPLANT
PENCIL BUTTON HOLSTER BLD 10FT (ELECTRODE) ×2 IMPLANT
POUCH SPECIMEN RETRIEVAL 10MM (ENDOMECHANICALS) ×2 IMPLANT
RELOAD EGIA 45 MED/THCK PURPLE (STAPLE) ×2 IMPLANT
RELOAD EGIA 45 TAN VASC (STAPLE) ×2 IMPLANT
SCALPEL HARMONIC ACE (MISCELLANEOUS) ×2 IMPLANT
SCISSORS LAP 5X35 DISP (ENDOMECHANICALS) IMPLANT
SET IRRIG TUBING LAPAROSCOPIC (IRRIGATION / IRRIGATOR) IMPLANT
SLEEVE Z-THREAD 5X100MM (TROCAR) ×2 IMPLANT
SOLUTION ANTI FOG 6CC (MISCELLANEOUS) ×2 IMPLANT
SUT MNCRL AB 4-0 PS2 18 (SUTURE) ×2 IMPLANT
SUT VICRYL 0 UR6 27IN ABS (SUTURE) ×2 IMPLANT
TOWEL OR 17X26 10 PK STRL BLUE (TOWEL DISPOSABLE) ×2 IMPLANT
TRAY FOLEY CATH 14FRSI W/METER (CATHETERS) ×2 IMPLANT
TRAY LAP CHOLE (CUSTOM PROCEDURE TRAY) ×2 IMPLANT
TROCAR BALLN 12MMX100 BLUNT (TROCAR) ×2 IMPLANT
TROCAR Z-THREAD FIOS 5X100MM (TROCAR) ×2 IMPLANT
TUBING INSUFFLATION 10FT LAP (TUBING) ×2 IMPLANT

## 2011-12-02 NOTE — Transfer of Care (Signed)
Immediate Anesthesia Transfer of Care Note  Patient: Todd [name]  Procedure(s) Performed: Procedure(s) (LRB) with comments: APPENDECTOMY LAPAROSCOPIC (N/A)  Patient Location: PACU  Anesthesia Type: General  Level of Consciousness: awake, alert , oriented and patient cooperative  Airway & Oxygen Therapy: Patient Spontanous Breathing and Patient connected to face mask oxygen  Post-op Assessment: Report given to PACU RN, Post -op Vital signs reviewed and stable and Patient moving all extremities X 4  Post vital signs: stable  Complications: No apparent anesthesia complications

## 2011-12-02 NOTE — Anesthesia Procedure Notes (Addendum)
Performed by: Anastasio Champion E   Procedure Name: Intubation Date/Time: 12/02/2011 5:55 AM Performed by: Illene Silver Pre-anesthesia Checklist: Patient identified, Patient being monitored, Emergency Drugs available and Suction available Patient Re-evaluated:Patient Re-evaluated prior to inductionOxygen Delivery Method: Circle system utilized Preoxygenation: Pre-oxygenation with 100% oxygen Intubation Type: IV induction Ventilation: Mask ventilation without difficulty Grade View: Grade III Tube type: Glide rite Number of attempts: 1 Airway Equipment and Method: Stylet and Video-laryngoscopy Placement Confirmation: ETT inserted through vocal cords under direct vision,  positive ETCO2 and breath sounds checked- equal and bilateral Secured at: 21 cm Tube secured with: Tape Dental Injury: Teeth and Oropharynx as per pre-operative assessment  Difficulty Due To: Difficulty was anticipated Future Recommendations: Recommend- induction with short-acting agent, and alternative techniques readily available Comments: Elected glidescope, small mouth,anterior larynx. O2 sats 99% throughout induction and intubation

## 2011-12-02 NOTE — ED Provider Notes (Signed)
History     CSN: [id number]  Arrival date & time 12/01/11  2221   First MD Initiated Contact with Patient 12/01/11 2356      Chief Complaint  Patient presents with  . Abdominal Pain    (Consider location/radiation/quality/duration/timing/severity/associated sxs/prior treatment) HPI  Patient presents to the emergency department with complaints of abdominal pain. He states that he woke up around 4 AM on Monday morning with the pain pills. The local. The pain continued to get worse throughout the day and began to localize the right lower quadrant. He did have 3 loose stools and a couple of episodes of vomiting. He states that he feels weak and has not been able to keep down food today. Denies having any previous abdominal surgeries. Pt in pain but in NAD/VSS  History reviewed. No pertinent past medical history.  History reviewed. No pertinent past surgical history.  Family History  Problem Relation Age of Onset  . Hypertension Mother   . Hypertension Father     History  Substance Use Topics  . Smoking status: Never Smoker   . Smokeless tobacco: Not on file  . Alcohol Use: No      Review of Systems   Review of Systems  Gen: no weight loss, fevers, chills, night sweats  Eyes: no discharge or drainage, no occular pain or visual changes  Nose: no epistaxis or rhinorrhea  Mouth: no dental pain, no sore throat  Neck: no neck pain  Lungs:No wheezing, coughing or hemoptysis CV: no chest pain, palpitations, dependent edema or orthopnea  Abd: + abdominal pain, nausea, vomiting and diarrhea GU: no dysuria or gross hematuria  MSK:  No abnormalities  Neuro: no headache, no focal neurologic deficits  Skin: no abnormalities Psyche: negative.    Allergies  Wellbutrin  Home Medications  No current outpatient prescriptions on file.  BP 124/76  Pulse 75  Temp 98.4 F (36.9 C) (Oral)  Resp 20  Ht 5\' 6"  (1.676 m)  Wt 162 lb (73.483 kg)  BMI 26.15 kg/m2  SpO2  99%  Physical Exam  Nursing note and vitals reviewed. Constitutional: He appears well-developed and well-nourished. No distress.  HENT:  Head: Normocephalic and atraumatic.  Eyes: Pupils are equal, round, and reactive to light.  Neck: Normal range of motion. Neck supple.  Cardiovascular: Normal rate and regular rhythm.   Pulmonary/Chest: Effort normal.  Abdominal: Soft. He exhibits no distension and no mass. There is tenderness (periumbilical and RLQ). There is rebound (RLQ). There is no guarding.  Neurological: He is alert.  Skin: Skin is warm and dry.    ED Course  Procedures (including critical care time)  Labs Reviewed  CBC WITH DIFFERENTIAL - Abnormal; Notable for the following:    WBC 11.4 (*)     Neutro Abs 8.2 (*)     All other components within normal limits  COMPREHENSIVE METABOLIC PANEL - Abnormal; Notable for the following:    Total Bilirubin 1.4 (*)     All other components within normal limits  URINALYSIS, ROUTINE W REFLEX MICROSCOPIC - Abnormal; Notable for the following:    APPearance CLOUDY (*)     Specific Gravity, Urine 1.031 (*)     Ketones, ur TRACE (*)     All other components within normal limits  LIPASE, BLOOD   Ct Abdomen Pelvis W Contrast  12/02/2011  *RADIOLOGY REPORT*  Clinical Data: Mid abdominal pain.  Loose stools.  Weakness.  White cell count 11.4.  CT ABDOMEN AND PELVIS WITH CONTRAST  Technique:  Multidetector CT imaging of the abdomen and pelvis was performed following the standard protocol during bolus administration of intravenous contrast.  Contrast: OMNIPAQUE IOHEXOL 300 MG/ML  SOLN  Comparison: None.  Findings: Dependent atelectasis in the lung bases.  The liver, spleen, gallbladder, pancreas, adrenal glands, kidneys, abdominal aorta, and retroperitoneal lymph nodes are unremarkable. The stomach, small bowel, and colon are decompressed without obvious wall thickening.  Stool fills the colon.  No free air or free fluid in the abdomen.   Pelvis:  There is an appendicolith at the appendiceal base.  The appendix is distended, with transverse diameter of 12 mm, and fluid- filled proximally with mild periappendiceal stranding.  Changes are consistent with early appendicitis. No abscess.  Bladder wall is not thickened.  There is focal calcification in the anterior bladder wall.  No diverticulitis.  No free or loculated pelvic fluid collections.  Mildly enlarged prostate gland measuring 5 x 4.1 cm.  No significant lymphadenopathy in the pelvis.  Normal alignment of the lumbar spine with mild degenerative change.  IMPRESSION: Distended appendix with periappendiceal stranding and appendicolith consistent with early acute appendicitis.  No abscess.   Original Report Authenticated By: Marlon Pel, M.D.    Dg Abd Acute W/chest  12/02/2011  *RADIOLOGY REPORT*  Clinical Data: Abdominal pain and nausea.  ACUTE ABDOMEN SERIES (ABDOMEN 2 VIEW & CHEST 1 VIEW)  Comparison: 06/08/2008  Findings: Stable appearance of the chest since previous study. Normal heart size and pulmonary vascularity.  No focal consolidation in the lungs.  No blunting of costophrenic angles.  Scattered gas and stool in the colon.  No small or large bowel dilatation.  No free intra-abdominal air.  No abnormal air fluid levels.  No radiopaque stones.  Calcified phleboliths in the pelvis.  Visualized bones appear intact.  IMPRESSION: No evidence of active pulmonary disease.  Nonobstructive bowel gas pattern.   Original Report Authenticated By: Marlon Pel, M.D.      1. Acute appendicitis       MDM  pts pain controlled in the ED with medication. Suspect appendicitis. CT abd/pelv shows early appendicitis. Patient care handed over to dr. Radford Pax for consult to surgeon, treatment, and admission.         Dorthula Matas, PA 12/02/11 [phone]

## 2011-12-02 NOTE — H&P (Signed)
Reason for Consult:appendicitis Referring Physician: [name] is an 51 y.o. male.  HPI: this patient presents for 1-1/2 day history of increasing right lower quadrant pain. He said that his symptoms all began 9:30 at night on Sunday evening. He woke up that night with right lower quadrant pain and had a few episodes of diarrhea as well. He went to work yesterday and throughout the day had persistent right lower quadrant pain which was not improving. He has had no other associated symptoms such as fevers or chills or nausea or vomiting he has been anorexic and has had a poor appetite. Because of the continual pain, which she describes as "rocks in his abdomen", he came to the emergency room for evaluation. He had a CT scan and physical exam concerning for appendicitis  History reviewed. No pertinent past medical history.  History reviewed. No pertinent past surgical history.  Family History  Problem Relation Age of Onset  . Hypertension Mother   . Hypertension Father     Social History:  reports that he has never smoked. He does not have any smokeless tobacco history on file. He reports that he does not drink alcohol or use illicit drugs.  Allergies:  Allergies  Allergen Reactions  . Wellbutrin (Bupropion Hcl) Itching    Medications: I have reviewed the patient's current medications.  Results for orders placed during the hospital encounter of 12/01/11 (from the past 48 hour(s))  CBC WITH DIFFERENTIAL     Status: Abnormal   Collection Time   12/01/11 11:40 PM      Component Value Range Comment   WBC 11.4 (*) 4.0 - 10.5 K/uL    RBC 5.04  4.22 - 5.81 MIL/uL    Hemoglobin 15.1  13.0 - 17.0 g/dL    HCT 04.5  40.9 - 81.1 %    MCV 84.3  78.0 - 100.0 fL    MCH 30.0  26.0 - 34.0 pg    MCHC 35.5  30.0 - 36.0 g/dL    RDW 91.4  78.2 - 95.6 %    Platelets 212  150 - 400 K/uL    Neutrophils Relative 72  43 - 77 %    Neutro Abs 8.2 (*) 1.7 - 7.7 K/uL    Lymphocytes Relative 20   12 - 46 %    Lymphs Abs 2.3  0.7 - 4.0 K/uL    Monocytes Relative 6  3 - 12 %    Monocytes Absolute 0.7  0.1 - 1.0 K/uL    Eosinophils Relative 2  0 - 5 %    Eosinophils Absolute 0.2  0.0 - 0.7 K/uL    Basophils Relative 0  0 - 1 %    Basophils Absolute 0.0  0.0 - 0.1 K/uL   COMPREHENSIVE METABOLIC PANEL     Status: Abnormal   Collection Time   12/01/11 11:40 PM      Component Value Range Comment   Sodium 138  135 - 145 mEq/L    Potassium 4.1  3.5 - 5.1 mEq/L    Chloride 103  96 - 112 mEq/L    CO2 29  19 - 32 mEq/L    Glucose, Bld 97  70 - 99 mg/dL    BUN 12  6 - 23 mg/dL    Creatinine, Ser 2.13  0.50 - 1.35 mg/dL    Calcium 9.6  8.4 - 08.6 mg/dL    Total Protein 7.2  6.0 - 8.3 g/dL    Albumin  4.2  3.5 - 5.2 g/dL    AST 19  0 - 37 U/L    ALT 16  0 - 53 U/L    Alkaline Phosphatase 53  39 - 117 U/L    Total Bilirubin 1.4 (*) 0.3 - 1.2 mg/dL    GFR calc non Af Amer >90  >90 mL/min    GFR calc Af Amer >90  >90 mL/min   LIPASE, BLOOD     Status: Normal   Collection Time   12/01/11 11:40 PM      Component Value Range Comment   Lipase 26  11 - 59 U/L   URINALYSIS, ROUTINE W REFLEX MICROSCOPIC     Status: Abnormal   Collection Time   12/01/11 11:43 PM      Component Value Range Comment   Color, Urine YELLOW  YELLOW    APPearance CLOUDY (*) CLEAR    Specific Gravity, Urine 1.031 (*) 1.005 - 1.030    pH 5.5  5.0 - 8.0    Glucose, UA NEGATIVE  NEGATIVE mg/dL    Hgb urine dipstick NEGATIVE  NEGATIVE    Bilirubin Urine NEGATIVE  NEGATIVE    Ketones, ur TRACE (*) NEGATIVE mg/dL    Protein, ur NEGATIVE  NEGATIVE mg/dL    Urobilinogen, UA 0.2  0.0 - 1.0 mg/dL    Nitrite NEGATIVE  NEGATIVE    Leukocytes, UA NEGATIVE  NEGATIVE MICROSCOPIC NOT DONE ON URINES WITH NEGATIVE PROTEIN, BLOOD, LEUKOCYTES, NITRITE, OR GLUCOSE <1000 mg/dL.    Ct Abdomen Pelvis W Contrast  12/02/2011  *RADIOLOGY REPORT*  Clinical Data: Mid abdominal pain.  Loose stools.  Weakness.  White cell count 11.4.  CT  ABDOMEN AND PELVIS WITH CONTRAST  Technique:  Multidetector CT imaging of the abdomen and pelvis was performed following the standard protocol during bolus administration of intravenous contrast.  Contrast: OMNIPAQUE IOHEXOL 300 MG/ML  SOLN  Comparison: None.  Findings: Dependent atelectasis in the lung bases.  The liver, spleen, gallbladder, pancreas, adrenal glands, kidneys, abdominal aorta, and retroperitoneal lymph nodes are unremarkable. The stomach, small bowel, and colon are decompressed without obvious wall thickening.  Stool fills the colon.  No free air or free fluid in the abdomen.  Pelvis:  There is an appendicolith at the appendiceal base.  The appendix is distended, with transverse diameter of 12 mm, and fluid- filled proximally with mild periappendiceal stranding.  Changes are consistent with early appendicitis. No abscess.  Bladder wall is not thickened.  There is focal calcification in the anterior bladder wall.  No diverticulitis.  No free or loculated pelvic fluid collections.  Mildly enlarged prostate gland measuring 5 x 4.1 cm.  No significant lymphadenopathy in the pelvis.  Normal alignment of the lumbar spine with mild degenerative change.  IMPRESSION: Distended appendix with periappendiceal stranding and appendicolith consistent with early acute appendicitis.  No abscess.   Original Report Authenticated By: Marlon Pel, M.D.    Dg Abd Acute W/chest  12/02/2011  *RADIOLOGY REPORT*  Clinical Data: Abdominal pain and nausea.  ACUTE ABDOMEN SERIES (ABDOMEN 2 VIEW & CHEST 1 VIEW)  Comparison: 06/08/2008  Findings: Stable appearance of the chest since previous study. Normal heart size and pulmonary vascularity.  No focal consolidation in the lungs.  No blunting of costophrenic angles.  Scattered gas and stool in the colon.  No small or large bowel dilatation.  No free intra-abdominal air.  No abnormal air fluid levels.  No radiopaque stones.  Calcified phleboliths in the  pelvis.   Visualized bones appear intact.  IMPRESSION: No evidence of active pulmonary disease.  Nonobstructive bowel gas pattern.   Original Report Authenticated By: Marlon Pel, M.D.    All other review of systems negative or noncontributory except as stated in the HPI  Blood pressure 124/76, pulse 75, temperature 98.4 F (36.9 C), temperature source Oral, resp. rate 20, height 5\' 6"  (1.676 m), weight 162 lb (73.483 kg), SpO2 99.00%. General appearance: alert, cooperative and no distress Head: Normocephalic, without obvious abnormality, atraumatic Neck: no JVD and supple, symmetrical, trachea midline Resp: clear to auscultation bilaterally Cardio: normal rate, regular GI: soft, NT, bilat lower abdominal pain, greatest in RLQ, no peritoneal signs Extremities: extremities normal, atraumatic, no cyanosis or edema Pulses: 2+ and symmetric Neurologic: Grossly normal  Assessment/Plan: Abdominal pain most likely consistent with appendicitis. His history and physical exam and CT scan are most likely consistent with acute appendicitis. I discussed with him the options for surgery versus continued observation and have recommended surgery for treatment. We discussed the procedure including its risks and benefits. We discussed the risks of infection, bleeding, pain, scarring, negative laparoscopy, bowel injury, leaks an abscess, and persistent pain and the need for open surgery and he expressed understanding and would like to pursue a diagnostic laparoscopy and appendectomy with possible open procedure.  Lodema Pilot DAVID 12/02/2011, 4:46 AM

## 2011-12-02 NOTE — Anesthesia Preprocedure Evaluation (Addendum)
Anesthesia Evaluation  Patient identified by MRN, date of birth, ID band Patient awake    Reviewed: Allergy & Precautions, H&P , NPO status , Patient's Chart, lab work & pertinent test results  Airway Mallampati: IV TM Distance: <3 FB Neck ROM: Full    Dental No notable dental hx.    Pulmonary neg pulmonary ROS,  breath sounds clear to auscultation  Pulmonary exam normal       Cardiovascular negative cardio ROS  Rhythm:Regular Rate:Normal     Neuro/Psych negative neurological ROS  negative psych ROS   GI/Hepatic negative GI ROS, Neg liver ROS,   Endo/Other  negative endocrine ROS  Renal/GU negative Renal ROS  negative genitourinary   Musculoskeletal negative musculoskeletal ROS (+)   Abdominal   Peds negative pediatric ROS (+)  Hematology negative hematology ROS (+)   Anesthesia Other Findings   Reproductive/Obstetrics negative OB ROS                          Anesthesia Physical Anesthesia Plan  ASA: I and Emergent  Anesthesia Plan: General   Post-op Pain Management:    Induction: Intravenous and Rapid sequence  Airway Management Planned: Oral ETT and Video Laryngoscope Planned  Additional Equipment:   Intra-op Plan:   Post-operative Plan: Extubation in OR  Informed Consent: I have reviewed the patients History and Physical, chart, labs and discussed the procedure including the risks, benefits and alternatives for the proposed anesthesia with the patient or authorized representative who has indicated his/her understanding and acceptance.   Dental advisory given  Plan Discussed with: CRNA and Surgeon  Anesthesia Plan Comments:        Anesthesia Quick Evaluation

## 2011-12-02 NOTE — Anesthesia Postprocedure Evaluation (Signed)
  Anesthesia Post-op Note  Patient: Radio broadcast assistant  Procedure(s) Performed: Procedure(s) (LRB): APPENDECTOMY LAPAROSCOPIC (N/A)  Patient Location: PACU  Anesthesia Type: General  Level of Consciousness: awake and alert   Airway and Oxygen Therapy: Patient Spontanous Breathing  Post-op Pain: mild  Post-op Assessment: Post-op Vital signs reviewed, Patient's Cardiovascular Status Stable, Respiratory Function Stable, Patent Airway and No signs of Nausea or vomiting  Post-op Vital Signs: stable  Complications: No apparent anesthesia complications

## 2011-12-02 NOTE — ED Provider Notes (Signed)
Medical screening examination/treatment/procedure(s) were conducted as a shared visit with non-physician practitioner(s) and myself.  I personally evaluated the patient during the encounter    Nelia Shi, MD 12/02/11 [phone]

## 2011-12-02 NOTE — Brief Op Note (Signed)
12/01/2011 - 12/02/2011  6:50 AM  PATIENT:  Todd [name]  51 y.o. male  PRE-OPERATIVE DIAGNOSIS:  acute appendicitis  POST-OPERATIVE DIAGNOSIS:  appendicitis  PROCEDURE:  Procedure(s) (LRB) with comments: APPENDECTOMY LAPAROSCOPIC (N/A)  SURGEON:  Surgeon(s) and Role:    * Lodema Pilot, DO - Primary  PHYSICIAN ASSISTANT:   ASSISTANTS: none   ANESTHESIA:   general  EBL:  Total I/O In: 1000 [I.V.:1000] Out: 300 [Urine:250; Blood:50]  BLOOD ADMINISTERED:none  DRAINS: none   LOCAL MEDICATIONS USED:  MARCAINE    and LIDOCAINE   SPECIMEN:  Source of Specimen:  appendix  DISPOSITION OF SPECIMEN:  PATHOLOGY  COUNTS:  YES  TOURNIQUET:  * No tourniquets in log *  DICTATION: .Other Dictation: Dictation Number [id number]  PLAN OF CARE: Admit to inpatient   PATIENT DISPOSITION:  PACU - hemodynamically stable.   Delay start of Pharmacological VTE agent (>24hrs) due to surgical blood loss or risk of bleeding: no

## 2011-12-03 ENCOUNTER — Encounter (HOSPITAL_COMMUNITY): Payer: Self-pay | Admitting: General Surgery

## 2011-12-03 MED ORDER — HYDROCODONE-ACETAMINOPHEN 5-325 MG PO TABS: 1.0000 | ORAL_TABLET | ORAL | Status: AC | PRN

## 2011-12-03 NOTE — Care Management Note (Signed)
    Page 1 of 1   12/03/2011     10:59:01 AM   CARE MANAGEMENT NOTE 12/03/2011  Patient:  Todd [name], Todd [name]   Account Number:  [id number]  Date Initiated:  12/03/2011  Documentation initiated by:  Lorenda Ishihara  Subjective/Objective Assessment:   51 yo male admitted s/p lap appy. PTA lived at home with friend.     Action/Plan:   Anticipated DC Date:  12/03/2011   Anticipated DC Plan:  HOME/SELF CARE      DC Planning Services  CM consult      Choice offered to / List presented to:             Status of service:  Completed, signed off Medicare Important Message given?   (If response is "NO", the following Medicare IM given date fields will be blank) Date Medicare IM given:   Date Additional Medicare IM given:    Discharge Disposition:  HOME/SELF CARE  Per UR Regulation:  Reviewed for med. necessity/level of care/duration of stay  If discussed at Long Length of Stay Meetings, dates discussed:    Comments:

## 2011-12-03 NOTE — Discharge Summary (Signed)
Agree 

## 2011-12-03 NOTE — Discharge Summary (Signed)
  Patient ID: Ferd Horrigan MRN: [id number] DOB/AGE: Jan 23, 1961 51 y.o.  Admit date: 12/01/2011 Discharge date: 12/03/2011  Procedures: laparoscopic appendectomy  Consults: None  Reason for Admission: This is a 51 yo male who presented to [facility] with abdominal pain.  He was found to have acute appendicitis and was admitted.   Admission Diagnoses:  1. Acute appendicitis  Hospital Course: The patient was admitted and taken to the operating room where he underwent a lap appy.  He tolerated the procedure well.  On POD# 1, he was tolerating a regular diet and his pain was well controlled.  He was otherwise felt stable for discharge home.  PE: Abd: soft, appropriately tender, +BS, Nd, incisions c/d/i  Discharge Diagnoses:  1. Acute appendicitis 2. S/p lap appy  Discharge Medications:   Medication List     As of 12/03/2011  8:58 AM    TAKE these medications         HYDROcodone-acetaminophen 5-325 MG per tablet   Commonly known as: NORCO/VICODIN   Take 1-2 tablets by mouth every 4 (four) hours as needed.        Discharge Instructions:     Follow-up Information    Follow up with [facility] On 12/16/2011. (arrive at 2:00 for a 2:30pm appointment)    Contact information:   [address] [place] [address] [phone]         Signed: Letha Cape 12/03/2011, 8:58 AM

## 2011-12-03 NOTE — Op Note (Signed)
Todd [name], Todd [name]                 ACCOUNT NO.:  [id number]  MEDICAL RECORD NO.:  [id number]  LOCATION:  1535                         FACILITY:  [facility]  PHYSICIAN:  Lodema Pilot, MD       DATE OF BIRTH:  08-08-1960  DATE OF PROCEDURE:  12/02/2011 DATE OF DISCHARGE:                              OPERATIVE REPORT   PREOPERATIVE DIAGNOSIS:  Acute appendicitis.  POSTOPERATIVE DIAGNOSIS:  Acute appendicitis.  SURGEON:  Lodema Pilot, MD.  ASSISTANT:  None.  ANESTHESIA:  General endotracheal anesthesia with 30 mL of 1% lidocaine with epinephrine, 0.25% Marcaine in a 50:50 mixture.  FLUIDS:  1 L crystalloid.  ESTIMATED BLOOD LOSS:  Minimal.  DRAINS:  None.  SPECIMENS:  Appendix sent to Pathology for permanent sectioning.  COMPLICATIONS:  None apparent.  FINDINGS:  Acute appendicitis with appendicolith.  No other obvious intra-abdominal pathology identified.  INDICATION FOR PROCEDURE:  Todd [name] is a 51 year old male with 1-1/2- day history of right lower quadrant abdominal pain, anorexia, and physical exam and head CT scan concerning for acute appendicitis.  OPERATIVE DETAILS:  Todd [name] was seen and evaluated in the emergency room, risks and benefits of procedure were discussed in lay terms. Informed consent was obtained and the patient taken to the operating room, placed on table in supine position, and general endotracheal anesthesia obtained through pubic abscess antibiotics were given and. General endotracheal tube anesthesia was placed.  Foley catheter was placed.  His abdomen was prepped and draped in the standard surgical fashion and procedure time-out was performed with all operative team members to confirm proper patient, procedure, and supraumbilical midline incision was made in the skin and dissection carried down through subcutaneous tissue using blunt dissection.  The abdominal wall fascia was elevated, and sharply incised.  The peritoneum was entered  bluntly. 0 Vicryl sutures were placed through the fascia and a 12 mm balloon port was placed at the umbilicus and pneumoperitoneum was obtained. Laparoscope was introduced, and there was no evidence of bowel injury upon entry.  Then 5 mm left lower quadrant port and a 5 mm right upper quadrant port were placed under direct visualization.  The appendix was identified and was noted to be purple and thickened for the middle one- third of the appendix.  The base appeared healthy.  The appendix was elevated and a window was created through the mesoappendix, and Endo-GIA purple Tri-Staple load was used to transect the appendix at its base. Staples appeared well-formed without any evidence of bleeding and the Harmonic scalpel was used to divide the mesoappendix.  The appendix was placed in an EndoCatch bag and removed from the umbilical incision and sent to Pathology for permanent section.  He had some murky fluid in the pelvis.  This was suctioned.  The rest of the abdomen there was no other evidence of any pathology.  Gallbladder appeared normal.  He did have blood gas in his __________ otherwise, normal-appearing intestines appeared stable.  The staple line was again inspected and the staples were well-formed and hemostatic and mesoappendix was hemostatic.  The right upper quadrant trocar was removed under direct visualization, and the abdominal wall was noted  to be  hemostatic.  The abdominal wall fascia was completely closed with interrupted 0 Vicryl sutures in open fashion and sutures were secured and the abdomen was re-insufflated with carbon dioxide gas.  The laparoscope was placed through the left lower quadrant trocar site and the abdominal wall closure was noted be adequate without any evidence of bowel injury.  The gas was removed and the left lower quadrant trocar was removed.  Skin incisions were injected with 30 mL of 1% lidocaine with epinephrine and 0.25% Marcaine in a 50:50  mixture, and the skin edges were approximated with 4-0 Monocryl subcuticular suture.  Skin was washed and dried and Dermabond was applied.  All sponge, needle, and instrument counts correct at the end of the case.  The patient tolerated the procedure well without apparent complications.          ______________________________ Lodema Pilot, MD     BL/MEDQ  D:  12/02/2011  T:  12/03/2011  Job:  [id number]

## 2011-12-03 NOTE — Progress Notes (Signed)
Pt discharged to home with wife provided discharge instructions and prescriptions along with handouts. Pt verbalized understanding of discharge information. Pt stable. Pt transported by [facility] IV removed and documented. Annitta Needs, RN

## 2011-12-05 ENCOUNTER — Telehealth (INDEPENDENT_AMBULATORY_CARE_PROVIDER_SITE_OTHER): Payer: Self-pay | Admitting: General Surgery

## 2011-12-05 NOTE — Telephone Encounter (Signed)
Pt called to request refill of pain med following surgery.  Per standing orders, Hydrocodone 5/325 mg, # 30 , 1-2 po Q4-6H prn pain, no refill called to [facility]:  [phone]

## 2011-12-16 ENCOUNTER — Encounter (INDEPENDENT_AMBULATORY_CARE_PROVIDER_SITE_OTHER): Payer: Self-pay | Admitting: General Surgery

## 2011-12-16 ENCOUNTER — Ambulatory Visit (INDEPENDENT_AMBULATORY_CARE_PROVIDER_SITE_OTHER): Payer: BC Managed Care – PPO | Admitting: General Surgery

## 2011-12-16 VITALS — BP 116/76 | HR 72 | Temp 97.9°F | Ht 66.0 in | Wt 161.0 lb

## 2011-12-16 DIAGNOSIS — K3580 Unspecified acute appendicitis: Secondary | ICD-10-CM

## 2011-12-16 NOTE — Patient Instructions (Signed)
Increase po fluid intake, eat smaller meals.

## 2011-12-16 NOTE — Progress Notes (Signed)
Rayon Munns Aug 14, 1960 [id number] 12/16/2011   History of Present Illness: Cormac Wint is a  51 y.o. male who presents today status post lap appy by Dr. Biagio Quint.  Pathology reveals acute suppurative appendicitis.  The patient is tolerating a regular diet, having normal bowel movements, has good pain control.  He is still having occasional "gas" pains, and has been taking his hydrocodone for these pains, and not eating or drinking much fluids daily. States that he has had only 2 bowel movements since is surgery and feels constipated. He is back to most normal activities, but states that he still feels tired.   Physical Exam: Abd: soft, nontender, active bowel sounds, nondistended.  All incisions are well healed. There is no area of focal tenderness on exam and his BS are present but hypoactive. There is no evidence of bloating on exam.  Impression: 1.  Acute appendicitis, s/p lap appy  Plan: He is to increase his po intake of fluids, discontinue use of narcotics; as this is likely exacerbating his constipation issues, and to eat small frequent meals instead of occasional large meals as he has been dong. He can follow up on a prn basis. Patient verbalizes understanding of these instructions.

## 2012-06-06 ENCOUNTER — Ambulatory Visit (INDEPENDENT_AMBULATORY_CARE_PROVIDER_SITE_OTHER): Payer: Self-pay | Admitting: Emergency Medicine

## 2012-06-06 VITALS — BP 126/78 | HR 78 | Temp 98.2°F | Resp 17 | Ht 66.5 in | Wt 165.0 lb

## 2012-06-06 DIAGNOSIS — J209 Acute bronchitis, unspecified: Secondary | ICD-10-CM

## 2012-06-06 MED ORDER — PSEUDOEPHEDRINE-GUAIFENESIN ER 60-600 MG PO TB12: 1.0000 | ORAL_TABLET | Freq: Two times a day (BID) | ORAL | Status: DC

## 2012-06-06 MED ORDER — AMOXICILLIN 500 MG PO CAPS: 500.0000 mg | ORAL_CAPSULE | Freq: Three times a day (TID) | ORAL | Status: DC

## 2012-06-06 MED ORDER — HYDROCOD POLST-CHLORPHEN POLST 10-8 MG/5ML PO LQCR: 5.0000 mL | Freq: Two times a day (BID) | ORAL | Status: DC | PRN

## 2012-06-06 NOTE — Patient Instructions (Signed)

## 2012-06-06 NOTE — Progress Notes (Signed)
[facility] [address], [place] [address] [phone]  Date:  06/06/2012   Name:  Todd [name]   DOB:  August 30, 1960   MRN:  [id number]  PCP:  Provider Not In System    Chief Complaint: Cough and Laryngitis   History of Present Illness:  Todd [name] is a 52 y.o. very pleasant male patient who presents with the following:  Sore throat and cough for 10 days.  Started as a clear nasal drainage and congestion. Cough is non productive.  No wheezing or shortness of breath.  No fever or chills. No nausea or vomiting.  No stool change or rash.  No sick contacts. No improvement with over the counter medications or other home remedies.  Denies other complaint or health concern today.   There is no problem list on file for this patient.   History reviewed. No pertinent past medical history.  Past Surgical History  Procedure Laterality Date  . Laparoscopic appendectomy  12/02/2011    Procedure: APPENDECTOMY LAPAROSCOPIC;  Surgeon: Lodema Pilot, DO;  Location: [facility];  Service: General;  Laterality: N/A;  . Appendectomy  12/01/11    lap appy    History  Substance Use Topics  . Smoking status: Never Smoker   . Smokeless tobacco: Never Used  . Alcohol Use: No    Family History  Problem Relation Age of Onset  . Hypertension Mother   . Hypertension Father     Allergies  Allergen Reactions  . Wellbutrin (Bupropion Hcl) Itching    Medication list has been reviewed and updated.  No current outpatient prescriptions on file prior to visit.   No current facility-administered medications on file prior to visit.    Review of Systems:  As per HPI, otherwise negative.    Physical Examination: Filed Vitals:   06/06/12 1644  BP: 126/78  Pulse: 78  Temp: 98.2 F (36.8 C)  Resp: 17   Filed Vitals:   06/06/12 1644  Height: 5' 6.5" (1.689 m)  Weight: 165 lb (74.844 kg)   Body mass index is 26.24 kg/(m^2). Ideal Body Weight: Weight in (lb) to have BMI =  25: 156.9  GEN: WDWN, NAD, Non-toxic, A & O x 3 HEENT: Atraumatic, Normocephalic. Neck supple. No masses, No LAD.  Oropharynx erythematous Ears and Nose: No external deformity.  TM negative CV: RRR, No M/G/R. No JVD. No thrill. No extra heart sounds. PULM: CTA B, no wheezes, crackles, rhonchi. No retractions. No resp. distress. No accessory muscle use. ABD: S, NT, ND, +BS. No rebound. No HSM. EXTR: No c/c/e NEURO Normal gait.  PSYCH: Normally interactive. Conversant. Not depressed or anxious appearing.  Calm demeanor.    Assessment and Plan: Bronchitis Amoxicillin mucinex tussionex   Carmelina Dane, MD

## 2012-11-13 ENCOUNTER — Ambulatory Visit (INDEPENDENT_AMBULATORY_CARE_PROVIDER_SITE_OTHER): Payer: BC Managed Care – PPO | Admitting: Family Medicine

## 2012-11-13 VITALS — BP 114/72 | HR 62 | Temp 97.7°F | Resp 16 | Ht 66.5 in | Wt 164.0 lb

## 2012-11-13 DIAGNOSIS — T148 Other injury of unspecified body region: Secondary | ICD-10-CM

## 2012-11-13 DIAGNOSIS — W57XXXA Bitten or stung by nonvenomous insect and other nonvenomous arthropods, initial encounter: Secondary | ICD-10-CM

## 2012-11-13 MED ORDER — MUPIROCIN 2 % EX OINT: TOPICAL_OINTMENT | Freq: Three times a day (TID) | CUTANEOUS | Status: DC

## 2012-11-13 NOTE — Progress Notes (Signed)
  Subjective:    Patient ID: Todd [name], male    DOB: 1960-04-21, 52 y.o.   MRN: [id number] Chief Complaint  Patient presents with  . Insect Bite    abdomen- since Wednesday- itchy and painful    HPI   Wed evening felt a bite on his lower abdomen - so squeezed his shirt immed when he felt the bite and he did find that he had smashed a spider.  Lesion not changed since Wed.  No draining, but is sore and itchy.  Not putting anything on it.  History reviewed. No pertinent past medical history. No current outpatient prescriptions on file prior to visit.   No current facility-administered medications on file prior to visit.   Allergies  Allergen Reactions  . Wellbutrin [Bupropion Hcl] Itching    Review of Systems  Constitutional: Negative for fever, chills, activity change and appetite change.  Cardiovascular: Negative for leg swelling.  Gastrointestinal: Negative for nausea, vomiting, abdominal pain, diarrhea and constipation.  Musculoskeletal: Positive for myalgias. Negative for gait problem and joint swelling.  Skin: Positive for wound. Negative for color change, pallor and rash.  Neurological: Negative for weakness and numbness.  Hematological: Negative for adenopathy. Does not bruise/bleed easily.  Psychiatric/Behavioral: Negative for sleep disturbance.      BP 114/72  Pulse 62  Temp(Src) 97.7 F (36.5 C) (Oral)  Resp 16  Ht 5' 6.5" (1.689 m)  Wt 164 lb (74.39 kg)  BMI 26.08 kg/m2  SpO2 98% Objective:   Physical Exam  Constitutional: He is oriented to person, place, and time. He appears well-developed and well-nourished. No distress.  HENT:  Head: Normocephalic and atraumatic.  Eyes: No scleral icterus.  Pulmonary/Chest: Effort normal.  Neurological: He is alert and oriented to person, place, and time.  Skin: Skin is warm and dry. Rash noted. Rash is papular. He is not diaphoretic.  Small pinpoint papule w/ mild surrounding erythema - eraser head sized, no tenderness,  no induration or fluctuance, no warmth or drainage  Psychiatric: He has a normal mood and affect. His behavior is normal.      Assessment & Plan:   Insect bite Spider bite - benign local reaction. Warm compresses and top antibiotic. Prn benadryl for itching or could try topical benadryl. RTC for any signs of infection, call if itching worsens and needs med like hydroxyzine. Meds ordered this encounter  Medications  . mupirocin ointment (BACTROBAN) 2 %    Sig: Apply topically 3 (three) times daily.    Dispense:  30 g    Refill:  0    Norberto Sorenson, MD MPH

## 2012-11-13 NOTE — Patient Instructions (Signed)
RTC immed for any increased pain, redness, swelling, or purulent drainage or worsening in any way.  Call if the itching is getting to bad and you need any medicine for that.

## 2012-11-23 ENCOUNTER — Ambulatory Visit: Payer: BC Managed Care – PPO

## 2012-11-23 ENCOUNTER — Ambulatory Visit (INDEPENDENT_AMBULATORY_CARE_PROVIDER_SITE_OTHER): Payer: BC Managed Care – PPO | Admitting: Family Medicine

## 2012-11-23 VITALS — BP 108/83 | HR 77 | Temp 98.3°F | Resp 18 | Wt 164.0 lb

## 2012-11-23 DIAGNOSIS — M25569 Pain in unspecified knee: Secondary | ICD-10-CM

## 2012-11-23 DIAGNOSIS — M25561 Pain in right knee: Secondary | ICD-10-CM

## 2012-11-23 MED ORDER — NAPROXEN 500 MG PO TABS: 500.0000 mg | ORAL_TABLET | Freq: Two times a day (BID) | ORAL | Status: DC

## 2012-11-23 NOTE — Progress Notes (Signed)
  Subjective:    Patient ID: Todd [name], male    DOB: 10-17-1960, 52 y.o.   MRN: [id number]  HPI 52 yo male with complaint of right knee pain, onset 3 months ago. Pain on lateral aspect of knee.  Worse in am.  No injury that he recalls.  No radiation of pain.  Walking exacerbates it as does squatting.  No swelling.    History reviewed. No pertinent past medical history. Past Surgical History  Procedure Laterality Date  . Laparoscopic appendectomy  12/02/2011    Procedure: APPENDECTOMY LAPAROSCOPIC;  Surgeon: Lodema Pilot, DO;  Location: [facility];  Service: General;  Laterality: N/A;  . Appendectomy  12/01/11    lap appy   Allergies  Allergen Reactions  . Wellbutrin [Bupropion Hcl] Itching      Review of Systems  Constitutional: Negative for fever and chills.  Musculoskeletal: Positive for arthralgias. Negative for joint swelling and gait problem.       Objective:   Physical Exam Blood pressure 108/83, pulse 77, temperature 98.3 F (36.8 C), temperature source Oral, resp. rate 18, weight 164 lb (74.39 kg), SpO2 100.00%. Body mass index is 26.08 kg/(m^2). Well-developed, well nourished male who is awake, alert and oriented, in NAD. HEENT: [place]/AT, PERRL, EOMI.  Sclera and conjunctiva are clear.   Heart: RRR, no murmur Lungs: normal effort, CTA Abdomen: normo-active bowel sounds, supple, non-tender, no mass or organomegaly. Extremities: right knee with mild lateral joint line tenderness.  No ligamentous laxity.  Negative Lachman.  No patellar crepitus. Skin: warm and dry without rash.  Xray right knee: No effusion, no fracture, no evidence of DJD.   Assessment & Plan:  Knee pain  10 days of NSAIDs and knee exercises, follow up in 4 weeks for re-eval, if no improvement will consider furgher imaging or injection.

## 2012-11-23 NOTE — Patient Instructions (Signed)
Knee Exercises EXERCISES RANGE OF MOTION(ROM) AND STRETCHING EXERCISES These exercises may help you when beginning to rehabilitate your injury. Your symptoms may resolve with or without further involvement from your physician, physical therapist or athletic trainer. While completing these exercises, remember:   Restoring tissue flexibility helps normal motion to return to the joints. This allows healthier, less painful movement and activity.  An effective stretch should be held for at least 30 seconds.  A stretch should never be painful. You should only feel a gentle lengthening or release in the stretched tissue. STRETCH - Knee Extension, Prone  Lie on your stomach on a firm surface, such as a bed or countertop. Place your right / left knee and leg just beyond the edge of the surface. You may wish to place a towel under the far end of your right / left thigh for comfort.  Relax your leg muscles and allow gravity to straighten your knee. Your clinician may advise you to add an ankle weight if more resistance is helpful for you.  You should feel a stretch in the back of your right / left knee. Hold this position for __________ seconds. Repeat __________ times. Complete this stretch __________ times per day. * Your physician, physical therapist or athletic trainer may ask you to add ankle weight to enhance your stretch.  RANGE OF MOTION - Knee Flexion, Active  Lie on your back with both knees straight. (If this causes back discomfort, bend your opposite knee, placing your foot flat on the floor.)  Slowly slide your heel back toward your buttocks until you feel a gentle stretch in the front of your knee or thigh.  Hold for __________ seconds. Slowly slide your heel back to the starting position. Repeat __________ times. Complete this exercise __________ times per day.  STRETCH - Quadriceps, Prone   Lie on your stomach on a firm surface, such as a bed or padded floor.  Bend your right /  left knee and grasp your ankle. If you are unable to reach, your ankle or pant leg, use a belt around your foot to lengthen your reach.  Gently pull your heel toward your buttocks. Your knee should not slide out to the side. You should feel a stretch in the front of your thigh and/or knee.  Hold this position for __________ seconds. Repeat __________ times. Complete this stretch __________ times per day.  STRETCH  Hamstrings, Supine   Lie on your back. Loop a belt or towel over the ball of your right / left foot.  Straighten your right / left knee and slowly pull on the belt to raise your leg. Do not allow the right / left knee to bend. Keep your opposite leg flat on the floor.  Raise the leg until you feel a gentle stretch behind your right / left knee or thigh. Hold this position for __________ seconds. Repeat __________ times. Complete this stretch __________ times per day.  STRENGTHENING EXERCISES These exercises may help you when beginning to rehabilitate your injury. They may resolve your symptoms with or without further involvement from your physician, physical therapist or athletic trainer. While completing these exercises, remember:   Muscles can gain both the endurance and the strength needed for everyday activities through controlled exercises.  Complete these exercises as instructed by your physician, physical therapist or athletic trainer. Progress the resistance and repetitions only as guided.  You may experience muscle soreness or fatigue, but the pain or discomfort you are trying to eliminate should   never worsen during these exercises. If this pain does worsen, stop and make certain you are following the directions exactly. If the pain is still present after adjustments, discontinue the exercise until you can discuss the trouble with your clinician. STRENGTH - Quadriceps, Isometrics  Lie on your back with your right / left leg extended and your opposite knee bent.  Gradually  tense the muscles in the front of your right / left thigh. You should see either your knee cap slide up toward your hip or increased dimpling just above the knee. This motion will push the back of the knee down toward the floor/mat/bed on which you are lying.  Hold the muscle as tight as you can without increasing your pain for __________ seconds.  Relax the muscles slowly and completely in between each repetition. Repeat __________ times. Complete this exercise __________ times per day.  STRENGTH - Quadriceps, Short Arcs   Lie on your back. Place a __________ inch towel roll under your knee so that the knee slightly bends.  Raise only your lower leg by tightening the muscles in the front of your thigh. Do not allow your thigh to rise.  Hold this position for __________ seconds. Repeat __________ times. Complete this exercise __________ times per day.  OPTIONAL ANKLE WEIGHTS: Begin with ____________________, but DO NOT exceed ____________________. Increase in 1 pound/0.5 kilogram increments.  STRENGTH - Quadriceps, Straight Leg Raises  Quality counts! Watch for signs that the quadriceps muscle is working to insure you are strengthening the correct muscles and not "cheating" by substituting with healthier muscles.  Lay on your back with your right / left leg extended and your opposite knee bent.  Tense the muscles in the front of your right / left thigh. You should see either your knee cap slide up or increased dimpling just above the knee. Your thigh may even quiver.  Tighten these muscles even more and raise your leg 4 to 6 inches off the floor. Hold for __________ seconds.  Keeping these muscles tense, lower your leg.  Relax the muscles slowly and completely in between each repetition. Repeat __________ times. Complete this exercise __________ times per day.  STRENGTH - Hamstring, Curls  Lay on your stomach with your legs extended. (If you lay on a bed, your feet may hang over the  edge.)  Tighten the muscles in the back of your thigh to bend your right / left knee up to 90 degrees. Keep your hips flat on the bed/floor.  Hold this position for __________ seconds.  Slowly lower your leg back to the starting position. Repeat __________ times. Complete this exercise __________ times per day.  OPTIONAL ANKLE WEIGHTS: Begin with ____________________, but DO NOT exceed ____________________. Increase in 1 pound/0.5 kilogram increments.  STRENGTH  Quadriceps, Squats  Stand in a door frame so that your feet and knees are in line with the frame.  Use your hands for balance, not support, on the frame.  Slowly lower your weight, bending at the hips and knees. Keep your lower legs upright so that they are parallel with the door frame. Squat only within the range that does not increase your knee pain. Never let your hips drop below your knees.  Slowly return upright, pushing with your legs, not pulling with your hands. Repeat __________ times. Complete this exercise __________ times per day.  STRENGTH - Quadriceps, Wall Slides  Follow guidelines for form closely. Increased knee pain often results from poorly placed feet or knees.  Lean against   a smooth wall or door and walk your feet out 18-24 inches. Place your feet hip-width apart.  Slowly slide down the wall or door until your knees bend __________ degrees.* Keep your knees over your heels, not your toes, and in line with your hips, not falling to either side.  Hold for __________ seconds. Stand up to rest for __________ seconds in between each repetition. Repeat __________ times. Complete this exercise __________ times per day. * Your physician, physical therapist or athletic trainer will alter this angle based on your symptoms and progress. Document Released: 01/22/2005 Document Revised: 06/02/2011 Document Reviewed: 06/22/2008 [employer] Patient Information 2014 [employer].  

## 2012-11-24 NOTE — Progress Notes (Signed)
patient discussed with Dr. Lorri Frederick. Agree with assessment and plan of care per his note. XR report noted of R knee: Findings: No acute fracture or dislocation. Joint spaces  maintained. No definite suprapatellar joint effusion.  IMPRESSION:  No acute osseous abnormality.

## 2013-01-12 ENCOUNTER — Ambulatory Visit (INDEPENDENT_AMBULATORY_CARE_PROVIDER_SITE_OTHER): Payer: BC Managed Care – PPO | Admitting: Physician Assistant

## 2013-01-12 VITALS — BP 110/70 | HR 93 | Temp 98.2°F | Resp 16 | Ht 65.5 in | Wt 166.0 lb

## 2013-01-12 DIAGNOSIS — J029 Acute pharyngitis, unspecified: Secondary | ICD-10-CM

## 2013-01-12 DIAGNOSIS — J069 Acute upper respiratory infection, unspecified: Secondary | ICD-10-CM

## 2013-01-12 LAB — POCT RAPID STREP A (OFFICE): Rapid Strep A Screen: NEGATIVE

## 2013-01-12 MED ORDER — MAGIC MOUTHWASH W/LIDOCAINE: ORAL | Status: DC

## 2013-01-12 MED ORDER — IPRATROPIUM BROMIDE 0.06 % NA SOLN: 2.0000 | Freq: Three times a day (TID) | NASAL | Status: DC

## 2013-01-12 MED ORDER — GUAIFENESIN ER 1200 MG PO TB12: 1.0000 | ORAL_TABLET | Freq: Two times a day (BID) | ORAL | Status: AC

## 2013-01-12 NOTE — Progress Notes (Signed)
   [address] [place] [address]   Phone [phone]  Subjective:    Patient ID: Todd [name], male    DOB: 1960/12/14, 52 y.o.   MRN: [id number]  HPI Pt presents to clinic with 2 day h/o sore throat and congestion.  His wife has similar symptoms but she has been sick for about 4 days.  He feel fatigues and has no energy.  He has clear rhinorrhea with PND and no cough.  OTC meds - alkaseltzer with minimal relief of symptoms Wife with similar symptoms.  Review of Systems  Constitutional: Positive for fever, chills and fatigue.  HENT: Positive for congestion, postnasal drip, rhinorrhea (Clear) and sore throat.   Respiratory: Negative for cough.   Musculoskeletal: Negative for myalgias.  Neurological: Positive for headaches.       Objective:   Physical Exam  Vitals reviewed. Constitutional: He is oriented to person, place, and time. He appears well-developed and well-nourished.  HENT:  Head: Normocephalic and atraumatic.  Right Ear: Hearing, tympanic membrane, external ear and ear canal normal.  Left Ear: Hearing, tympanic membrane, external ear and ear canal normal.  Nose: Mucosal edema (red mucosa) present.  Mouth/Throat: Uvula is midline and mucous membranes are normal. Posterior oropharyngeal erythema present.  Eyes: Conjunctivae are normal.  Cardiovascular: Normal rate, regular rhythm and normal heart sounds.   No murmur heard. Pulmonary/Chest: Effort normal and breath sounds normal.  Neurological: He is alert and oriented to person, place, and time.  Skin: Skin is warm and dry.  Psychiatric: He has a normal mood and affect. His behavior is normal. Judgment and thought content normal.   Results for orders placed in visit on 01/12/13  POCT RAPID STREP A (OFFICE)      Result Value Range   Rapid Strep A Screen Negative  Negative       Assessment & Plan:  Acute pharyngitis - Plan: POCT rapid strep A, Alum & Mag Hydroxide-Simeth (MAGIC MOUTHWASH W/LIDOCAINE) SOLN  URI  (upper respiratory infection) - Plan: Guaifenesin (MUCINEX MAXIMUM STRENGTH) 1200 MG TB12, ipratropium (ATROVENT) 0.06 % nasal spray  Benny Lennert PA-C 01/12/2013 6:06 PM  Patient Instructions  Push fluids.  Tylenol and motrin for body aches and feeling poorly.  Call if you develop a cough for meds.  Expect this to last 7-10 days.

## 2013-01-12 NOTE — Patient Instructions (Signed)
Push fluids.  Tylenol and motrin for body aches and feeling poorly.  Call if you develop a cough for meds.  Expect this to last 7-10 days.

## 2013-04-28 ENCOUNTER — Encounter (HOSPITAL_COMMUNITY): Payer: Self-pay | Admitting: Emergency Medicine

## 2013-04-28 DIAGNOSIS — R52 Pain, unspecified: Secondary | ICD-10-CM | POA: Insufficient documentation

## 2013-04-28 DIAGNOSIS — IMO0001 Reserved for inherently not codable concepts without codable children: Secondary | ICD-10-CM | POA: Insufficient documentation

## 2013-04-28 DIAGNOSIS — R05 Cough: Secondary | ICD-10-CM | POA: Insufficient documentation

## 2013-04-28 DIAGNOSIS — R5383 Other fatigue: Secondary | ICD-10-CM

## 2013-04-28 DIAGNOSIS — R059 Cough, unspecified: Secondary | ICD-10-CM | POA: Insufficient documentation

## 2013-04-28 DIAGNOSIS — J111 Influenza due to unidentified influenza virus with other respiratory manifestations: Secondary | ICD-10-CM | POA: Insufficient documentation

## 2013-04-28 DIAGNOSIS — R51 Headache: Secondary | ICD-10-CM | POA: Insufficient documentation

## 2013-04-28 DIAGNOSIS — R0602 Shortness of breath: Secondary | ICD-10-CM | POA: Insufficient documentation

## 2013-04-28 DIAGNOSIS — R5381 Other malaise: Secondary | ICD-10-CM | POA: Insufficient documentation

## 2013-04-28 DIAGNOSIS — Z888 Allergy status to other drugs, medicaments and biological substances status: Secondary | ICD-10-CM | POA: Insufficient documentation

## 2013-04-28 NOTE — ED Notes (Signed)
Pt states he was seen by his PCP and started on tamaflu today.  Pt states fever of 102 at home.

## 2013-04-29 ENCOUNTER — Emergency Department (HOSPITAL_COMMUNITY): Payer: BC Managed Care – PPO

## 2013-04-29 ENCOUNTER — Emergency Department (HOSPITAL_COMMUNITY)
Admission: EM | Admit: 2013-04-29 | Discharge: 2013-04-29 | Disposition: A | Discharge status: Home / Self Care | Payer: BC Managed Care – PPO | Attending: Emergency Medicine | Admitting: Emergency Medicine

## 2013-04-29 DIAGNOSIS — J111 Influenza due to unidentified influenza virus with other respiratory manifestations: Secondary | ICD-10-CM

## 2013-04-29 LAB — URINALYSIS, ROUTINE W REFLEX MICROSCOPIC
Bilirubin Urine: NEGATIVE
Glucose, UA: NEGATIVE mg/dL
Hgb urine dipstick: NEGATIVE
Ketones, ur: NEGATIVE mg/dL
Leukocytes, UA: NEGATIVE
Nitrite: NEGATIVE
Protein, ur: NEGATIVE mg/dL
Specific Gravity, Urine: 1.013 (ref 1.005–1.030)
Urobilinogen, UA: 0.2 mg/dL (ref 0.0–1.0)
pH: 5.5 (ref 5.0–8.0)

## 2013-04-29 LAB — COMPREHENSIVE METABOLIC PANEL
ALT: 23 U/L (ref 0–53)
AST: 25 U/L (ref 0–37)
Albumin: 3.3 g/dL — ABNORMAL LOW (ref 3.5–5.2)
Alkaline Phosphatase: 58 U/L (ref 39–117)
BUN: 10 mg/dL (ref 6–23)
CO2: 21 mEq/L (ref 19–32)
Calcium: 8.4 mg/dL (ref 8.4–10.5)
Chloride: 104 mEq/L (ref 96–112)
Creatinine, Ser: 0.97 mg/dL (ref 0.50–1.35)
GFR calc Af Amer: >90 mL/min (ref >90)
GFR calc non Af Amer: >90 mL/min (ref >90)
Glucose, Bld: 114 mg/dL — ABNORMAL HIGH (ref 70–99)
Potassium: 4 mEq/L (ref 3.7–5.3)
Sodium: 139 mEq/L (ref 137–147)
Total Bilirubin: 0.6 mg/dL (ref 0.3–1.2)
Total Protein: 6.6 g/dL (ref 6.0–8.3)

## 2013-04-29 LAB — CG4 I-STAT (LACTIC ACID): Lactic Acid, Venous: 2.32 mmol/L — ABNORMAL HIGH (ref 0.5–2.2)

## 2013-04-29 LAB — CBC WITH DIFFERENTIAL/PLATELET
Basophils Absolute: 0.1 10*3/uL (ref 0.0–0.1)
Basophils Relative: 1 % (ref 0–1)
Eosinophils Absolute: 0.1 10*3/uL (ref 0.0–0.7)
Eosinophils Relative: 1 % (ref 0–5)
HCT: 39.4 % (ref 39.0–52.0)
Hemoglobin: 13.8 g/dL (ref 13.0–17.0)
Lymphocytes Relative: 13 % (ref 12–46)
Lymphs Abs: 0.8 10*3/uL (ref 0.7–4.0)
MCH: 30.1 pg (ref 26.0–34.0)
MCHC: 35 g/dL (ref 30.0–36.0)
MCV: 85.8 fL (ref 78.0–100.0)
Monocytes Absolute: 0.9 10*3/uL (ref 0.1–1.0)
Monocytes Relative: 15 % — ABNORMAL HIGH (ref 3–12)
Neutro Abs: 4.1 10*3/uL (ref 1.7–7.7)
Neutrophils Relative %: 69 % (ref 43–77)
Platelets: 164 10*3/uL (ref 150–400)
RBC: 4.59 MIL/uL (ref 4.22–5.81)
RDW: 12.7 % (ref 11.5–15.5)
WBC: 5.9 10*3/uL (ref 4.0–10.5)

## 2013-04-29 LAB — INFLUENZA PANEL BY PCR (TYPE A & B)
H1N1 flu by pcr: NOT DETECTED
Influenza A By PCR: POSITIVE — AB
Influenza B By PCR: NEGATIVE

## 2013-04-29 MED ORDER — SODIUM CHLORIDE 0.9 % IV BOLUS (SEPSIS)
1000.0000 mL | Freq: Once | INTRAVENOUS | Status: AC
  Administered 2013-04-29: 1000 mL via INTRAVENOUS

## 2013-04-29 MED ORDER — TRAMADOL HCL 50 MG PO TABS
50.0000 mg | ORAL_TABLET | Freq: Four times a day (QID) | ORAL | Status: DC | PRN
Start: 2013-04-29 — End: 2013-07-09

## 2013-04-29 MED ORDER — ACETAMINOPHEN 325 MG PO TABS
650.0000 mg | ORAL_TABLET | Freq: Once | ORAL | Status: AC
  Administered 2013-04-29: 650 mg via ORAL
  Filled 2013-04-29: qty 2

## 2013-04-29 MED ORDER — ONDANSETRON HCL 4 MG PO TABS: 4.0000 mg | ORAL_TABLET | Freq: Four times a day (QID) | ORAL | Status: DC

## 2013-04-29 NOTE — ED Notes (Signed)
Family at bedside. 

## 2013-04-29 NOTE — ED Notes (Signed)
Asked pt if they had to urinate due to need for specimen and pt stated they did not have to void at this time

## 2013-04-29 NOTE — ED Notes (Signed)
CG-4 results shown to RN-Michael Bolick.  He said he would show Dr. Cheri Guppy.

## 2013-04-29 NOTE — ED Notes (Signed)
Pt went to regular MD today ([facility]), pt was prescribed tamiflu and took one pill

## 2013-04-29 NOTE — ED Notes (Signed)
Pt c/o flu like symptoms since yesterday, headache, body aches, chills, fever, malaise and weakness

## 2013-04-29 NOTE — ED Provider Notes (Signed)
CSN: [id number]     Arrival date & time 04/28/13  2335 History   First MD Initiated Contact with Patient 04/29/13 0049     Chief Complaint  Patient presents with  . Fever   (Consider location/radiation/quality/duration/timing/severity/associated sxs/prior Treatment) HPI  Patient is a 53   Yo man with a history of HTN who presents with approx 30 hrs of fever with Tm of 102.3, myalgias, arthralgias, generalized weakness, headache. He has a mildly productive cough. He endorses "a little bit" of SOB. Patient says he has no energy. PO intake has been mildly diminished. No vomiting or diarrhea. NO chest or abdominal pain. No GU sx.   Patient says he was seen by his PCP approx 12 hrs ago, diagnosed with influenza, started on Tamiflu and has taken the first dose. But, he comes to the ED tonight because his fever has persisted throughout the afternoon and evening.   History reviewed. No pertinent past medical history. Past Surgical History  Procedure Laterality Date  . Laparoscopic appendectomy  12/02/2011    Procedure: APPENDECTOMY LAPAROSCOPIC;  Surgeon: Madilyn Hook, DO;  Location: [facility];  Service: General;  Laterality: N/A;  . Appendectomy  12/01/11    lap appy   Family History  Problem Relation Age of Onset  . Hypertension Mother   . Hypertension Father    History  Substance Use Topics  . Smoking status: Never Smoker   . Smokeless tobacco: Never Used  . Alcohol Use: No    Review of Systems Ten point review of symptoms performed and is negative with the exception of symptoms noted above.   Allergies  Wellbutrin  Home Medications   Current Outpatient Rx  Name  Route  Sig  Dispense  Refill  . acetaminophen (TYLENOL) 500 MG tablet   Oral   Take 1,000 mg by mouth every 6 (six) hours as needed for moderate pain, fever or headache.         . oseltamivir (TAMIFLU) 75 MG capsule   Oral   Take 75 mg by mouth 2 (two) times daily.           BP 116/73  Pulse 109  Temp(Src)  102.3 F (39.1 C) (Oral)  Resp 18  SpO2 99% Physical Exam Gen: well developed and well nourished appearing Head: NCAT Eyes: PERL, EOMI Nose: no epistaixis or rhinorrhea Mouth/throat: mucosa is moist and pink Neck: supple, no stridor Lungs: CTA B, no wheezing, rhonchi or rales CV: rapid and regular rate 104 bpm with normal rythm, good distal pulses.  Abd: soft, notender, nondistended Back: no ttp, no cva ttp Skin: warm and dry Ext: no edema, normal to inspection Neuro: CN ii-xii grossly intact, no focal deficits Psyche; normal affect,  calm and cooperative.  ED Course  Procedures (including critical care time) Labs Review  Results for orders placed during the hospital encounter of 04/29/13 (from the past 24 hour(s))  CBC WITH DIFFERENTIAL     Status: Abnormal   Collection Time    04/29/13  1:00 AM      Result Value Range   WBC 5.9  4.0 - 10.5 K/uL   RBC 4.59  4.22 - 5.81 MIL/uL   Hemoglobin 13.8  13.0 - 17.0 g/dL   HCT 39.4  39.0 - 52.0 %   MCV 85.8  78.0 - 100.0 fL   MCH 30.1  26.0 - 34.0 pg   MCHC 35.0  30.0 - 36.0 g/dL   RDW 12.7  11.5 - 15.5 %  Platelets 164  150 - 400 K/uL   Neutrophils Relative % 69  43 - 77 %   Neutro Abs 4.1  1.7 - 7.7 K/uL   Lymphocytes Relative 13  12 - 46 %   Lymphs Abs 0.8  0.7 - 4.0 K/uL   Monocytes Relative 15 (*) 3 - 12 %   Monocytes Absolute 0.9  0.1 - 1.0 K/uL   Eosinophils Relative 1  0 - 5 %   Eosinophils Absolute 0.1  0.0 - 0.7 K/uL   Basophils Relative 1  0 - 1 %   Basophils Absolute 0.1  0.0 - 0.1 K/uL  COMPREHENSIVE METABOLIC PANEL     Status: Abnormal   Collection Time    04/29/13  1:00 AM      Result Value Range   Sodium 139  137 - 147 mEq/L   Potassium 4.0  3.7 - 5.3 mEq/L   Chloride 104  96 - 112 mEq/L   CO2 21  19 - 32 mEq/L   Glucose, Bld 114 (*) 70 - 99 mg/dL   BUN 10  6 - 23 mg/dL   Creatinine, Ser 0.97  0.50 - 1.35 mg/dL   Calcium 8.4  8.4 - 10.5 mg/dL   Total Protein 6.6  6.0 - 8.3 g/dL   Albumin 3.3 (*)  3.5 - 5.2 g/dL   AST 25  0 - 37 U/L   ALT 23  0 - 53 U/L   Alkaline Phosphatase 58  39 - 117 U/L   Total Bilirubin 0.6  0.3 - 1.2 mg/dL   GFR calc non Af Amer >90  >90 mL/min   GFR calc Af Amer >90  >90 mL/min  CG4 I-STAT (LACTIC ACID)     Status: Abnormal   Collection Time    04/29/13  1:17 AM      Result Value Range   Lactic Acid, Venous 2.32 (*) 0.5 - 2.2 mmol/L   CXR: normal cardiac silloute, normal appearing mediastinum, no infiltrates, no acute process identified.  MDM  Patient with clinical diagnosis of influenza which is currently epidemic in this region. We are treating supportively with IVF. Patient has received first dose of Tamiflu and has script for same. CBC is wnl. CMP is wnl. POC lactate is slightly elevated.   Late entry - patient feeling better after tx with IVF. Labs are reassuring and I agree with clinical diagnosis of influenza. No signs of infiltrate or acute cardiopulmonary process on CXR. Patient stable for d/c.   Elyn Peers, MD 04/29/13 [phone]

## 2013-05-06 ENCOUNTER — Ambulatory Visit: Payer: BC Managed Care – PPO | Admitting: Internal Medicine

## 2013-07-09 ENCOUNTER — Ambulatory Visit: Payer: Self-pay

## 2013-07-09 ENCOUNTER — Ambulatory Visit (INDEPENDENT_AMBULATORY_CARE_PROVIDER_SITE_OTHER): Payer: BC Managed Care – PPO | Admitting: Emergency Medicine

## 2013-07-09 VITALS — BP 110/70 | HR 76 | Temp 98.1°F | Resp 18 | Ht 65.0 in | Wt 167.0 lb

## 2013-07-09 DIAGNOSIS — M545 Low back pain, unspecified: Secondary | ICD-10-CM

## 2013-07-09 MED ORDER — CYCLOBENZAPRINE HCL 10 MG PO TABS: ORAL_TABLET | ORAL | Status: DC

## 2013-07-09 MED ORDER — TRAMADOL HCL 50 MG PO TABS
50.0000 mg | ORAL_TABLET | Freq: Four times a day (QID) | ORAL | Status: DC | PRN
Start: 2013-07-09 — End: 2013-08-22

## 2013-07-09 MED ORDER — MELOXICAM 7.5 MG PO TABS: 7.5000 mg | ORAL_TABLET | Freq: Every day | ORAL | Status: DC

## 2013-07-09 NOTE — Patient Instructions (Signed)
Back Pain, Adult Low back pain is very common. About 1 in 5 people have back pain.The cause of low back pain is rarely dangerous. The pain often gets better over time.About half of people with a sudden onset of back pain feel better in just 2 weeks. About 8 in 10 people feel better by 6 weeks.  CAUSES Some common causes of back pain include:  Strain of the muscles or ligaments supporting the spine.  Wear and tear (degeneration) of the spinal discs.  Arthritis.  Direct injury to the back. DIAGNOSIS Most of the time, the direct cause of low back pain is not known.However, back pain can be treated effectively even when the exact cause of the pain is unknown.Answering your caregiver's questions about your overall health and symptoms is one of the most accurate ways to make sure the cause of your pain is not dangerous. If your caregiver needs more information, he or she may order lab work or imaging tests (X-rays or MRIs).However, even if imaging tests show changes in your back, this usually does not require surgery. HOME CARE INSTRUCTIONS For many people, back pain returns.Since low back pain is rarely dangerous, it is often a condition that people can learn to manageon their own.   Remain active. It is stressful on the back to sit or stand in one place. Do not sit, drive, or stand in one place for more than 30 minutes at a time. Take short walks on level surfaces as soon as pain allows.Try to increase the length of time you walk each day.  Do not stay in bed.Resting more than 1 or 2 days can delay your recovery.  Do not avoid exercise or work.Your body is made to move.It is not dangerous to be active, even though your back may hurt.Your back will likely heal faster if you return to being active before your pain is gone.  Pay attention to your body when you bend and lift. Many people have less discomfortwhen lifting if they bend their knees, keep the load close to their bodies,and  avoid twisting. Often, the most comfortable positions are those that put less stress on your recovering back.  Find a comfortable position to sleep. Use a firm mattress and lie on your side with your knees slightly bent. If you lie on your back, put a pillow under your knees.  Only take over-the-counter or prescription medicines as directed by your caregiver. Over-the-counter medicines to reduce pain and inflammation are often the most helpful.Your caregiver may prescribe muscle relaxant drugs.These medicines help dull your pain so you can more quickly return to your normal activities and healthy exercise.  Put ice on the injured area.  Put ice in a plastic bag.  Place a towel between your skin and the bag.  Leave the ice on for 15-20 minutes, 03-04 times a day for the first 2 to 3 days. After that, ice and heat may be alternated to reduce pain and spasms.  Ask your caregiver about trying back exercises and gentle massage. This may be of some benefit.  Avoid feeling anxious or stressed.Stress increases muscle tension and can worsen back pain.It is important to recognize when you are anxious or stressed and learn ways to manage it.Exercise is a great option. SEEK MEDICAL CARE IF:  You have pain that is not relieved with rest or medicine.  You have pain that does not improve in 1 week.  You have new symptoms.  You are generally not feeling well. SEEK   IMMEDIATE MEDICAL CARE IF:   You have pain that radiates from your back into your legs.  You develop new bowel or bladder control problems.  You have unusual weakness or numbness in your arms or legs.  You develop nausea or vomiting.  You develop abdominal pain.  You feel faint. Document Released: 03/10/2005 Document Revised: 09/09/2011 Document Reviewed: 07/29/2010 Exit[employer] Patient Information 2014 [employer].  

## 2013-07-09 NOTE — Progress Notes (Signed)
° °  Subjective:    Patient ID: Todd [name], male    DOB: Feb 17, 1961, 53 y.o.   MRN: [id number]  Back Pain Pertinent negatives include no dysuria.   Chief Complaint  Patient presents with   Back Pain    lower left side started today     This chart was scribed for Gershon Cull by Thea Alken, ED Scribe. This patient was seen in room 10 and the patient's care was started at 4:10 PM.  HPI Comments: Todd [name] is a 53 y.o. male who presents to the [facility] complaining of stabbing, non-radiating, left lower back pain onset this morning. Pt reports that when he woke up this morning he had back pain. Pt reports that when lifting object as heavy as 2lb, he experiences back pain. Pt denies h/o back pain. Pt works at [employer] but denies heavy lifting at work. Pt denies dysuria.   History reviewed. No pertinent past medical history. Allergies  Allergen Reactions   Wellbutrin [Bupropion Hcl] Itching   Prior to Admission medications   Not on File   Review of Systems  Genitourinary: Negative for dysuria.  Musculoskeletal: Positive for back pain.      Objective:   Physical Exam CONSTITUTIONAL: Well developed/well nourished HEAD: Normocephalic/atraumatic EYES: EOMI/PERRL ENMT: Mucous membranes moist NECK: supple no meningeal signs SPINE:entire spine nontender CV: S1/S2 noted, no murmurs/rubs/gallops noted LUNGS: Lungs are clear to auscultation bilaterally, no apparent distress ABDOMEN: soft, nontender, no rebound or guarding GU:no cva tenderness NEURO: Pt is awake/alert, moves all extremitiesx4 EXTREMITIES: pulses normal, full ROM, straight leg raises positive at 85 degree with left leg and 90 degrees with right leg. 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 I do not see any acute arthritic  changes or disc disease     Assessment & Plan:  Patient to take Mobic 7.5 mg one a day. I also placed him on Flexeril half tablet twice a day during the day and one tablet at night. He was given a few Ultram for pain.

## 2013-07-22 ENCOUNTER — Ambulatory Visit: Payer: BC Managed Care – PPO | Admitting: Internal Medicine

## 2013-08-22 ENCOUNTER — Observation Stay (HOSPITAL_COMMUNITY)
Admission: EM | Admit: 2013-08-22 | Discharge: 2013-08-24 | Disposition: A | Discharge status: Home / Self Care | Payer: BC Managed Care – PPO | Attending: Internal Medicine | Admitting: Internal Medicine

## 2013-08-22 ENCOUNTER — Emergency Department (HOSPITAL_COMMUNITY): Payer: BC Managed Care – PPO

## 2013-08-22 ENCOUNTER — Encounter (HOSPITAL_COMMUNITY): Payer: Self-pay | Admitting: Emergency Medicine

## 2013-08-22 DIAGNOSIS — H538 Other visual disturbances: Secondary | ICD-10-CM | POA: Diagnosis present

## 2013-08-22 DIAGNOSIS — G43109 Migraine with aura, not intractable, without status migrainosus: Secondary | ICD-10-CM | POA: Diagnosis present

## 2013-08-22 DIAGNOSIS — R488 Other symbolic dysfunctions: Secondary | ICD-10-CM | POA: Diagnosis present

## 2013-08-22 DIAGNOSIS — G459 Transient cerebral ischemic attack, unspecified: Secondary | ICD-10-CM

## 2013-08-22 DIAGNOSIS — G43901 Migraine, unspecified, not intractable, with status migrainosus: Principal | ICD-10-CM

## 2013-08-22 LAB — COMPREHENSIVE METABOLIC PANEL
ALT: 20 U/L (ref 0–53)
AST: 21 U/L (ref 0–37)
Albumin: 3.9 g/dL (ref 3.5–5.2)
Alkaline Phosphatase: 56 U/L (ref 39–117)
BUN: 17 mg/dL (ref 6–23)
CO2: 26 mEq/L (ref 19–32)
Calcium: 9 mg/dL (ref 8.4–10.5)
Chloride: 106 mEq/L (ref 96–112)
Creatinine, Ser: 0.98 mg/dL (ref 0.50–1.35)
GFR calc Af Amer: >90 mL/min (ref >90)
GFR calc non Af Amer: >90 mL/min (ref >90)
Glucose, Bld: 90 mg/dL (ref 70–99)
Potassium: 4.4 mEq/L (ref 3.7–5.3)
Sodium: 141 mEq/L (ref 137–147)
Total Bilirubin: 0.8 mg/dL (ref 0.3–1.2)
Total Protein: 7.2 g/dL (ref 6.0–8.3)

## 2013-08-22 LAB — DIFFERENTIAL
Basophils Absolute: 0 10*3/uL (ref 0.0–0.1)
Basophils Relative: 0 % (ref 0–1)
Eosinophils Absolute: 0.1 10*3/uL (ref 0.0–0.7)
Eosinophils Relative: 2 % (ref 0–5)
Lymphocytes Relative: 37 % (ref 12–46)
Lymphs Abs: 2.6 10*3/uL (ref 0.7–4.0)
Monocytes Absolute: 0.4 10*3/uL (ref 0.1–1.0)
Monocytes Relative: 5 % (ref 3–12)
Neutro Abs: 3.9 10*3/uL (ref 1.7–7.7)
Neutrophils Relative %: 56 % (ref 43–77)

## 2013-08-22 LAB — I-STAT CHEM 8, ED
BUN: 17 mg/dL (ref 6–23)
Calcium, Ion: 1.2 mmol/L (ref 1.12–1.23)
Chloride: 102 mEq/L (ref 96–112)
Creatinine, Ser: 1 mg/dL (ref 0.50–1.35)
Glucose, Bld: 96 mg/dL (ref 70–99)
HCT: 42 % (ref 39.0–52.0)
Hemoglobin: 14.3 g/dL (ref 13.0–17.0)
Potassium: 4.3 mEq/L (ref 3.7–5.3)
Sodium: 143 mEq/L (ref 137–147)
TCO2: 24 mmol/L (ref 0–100)

## 2013-08-22 LAB — CBC
HCT: 38.2 % — ABNORMAL LOW (ref 39.0–52.0)
Hemoglobin: 13.3 g/dL (ref 13.0–17.0)
MCH: 29.9 pg (ref 26.0–34.0)
MCHC: 34.8 g/dL (ref 30.0–36.0)
MCV: 85.8 fL (ref 78.0–100.0)
Platelets: 224 10*3/uL (ref 150–400)
RBC: 4.45 MIL/uL (ref 4.22–5.81)
RDW: 12.7 % (ref 11.5–15.5)
WBC: 7.1 10*3/uL (ref 4.0–10.5)

## 2013-08-22 LAB — APTT: aPTT: 32 seconds (ref 24–37)

## 2013-08-22 LAB — I-STAT TROPONIN, ED
Troponin i, poc: 0 ng/mL (ref 0.00–0.08)
Troponin i, poc: 0 ng/mL (ref 0.00–0.08)

## 2013-08-22 LAB — ETHANOL: Alcohol, Ethyl (B): <11 mg/dL (ref 0–11)

## 2013-08-22 LAB — PROTIME-INR
INR: 1.04 (ref 0.00–1.49)
Prothrombin Time: 13.4 seconds (ref 11.6–15.2)

## 2013-08-22 NOTE — ED Notes (Signed)
Pt reports 1 hour ago sudden loss of vision and memory loss, sts I couldn't remember names. Pt reprots his head feels numb and weakness all over. Pt reports sx started to resolve 10 mins ago. MD in to evaluate pt. Grips equal, pt currently a&ox4. Speech is clear.

## 2013-08-22 NOTE — ED Provider Notes (Signed)
CSN: [id number]     Arrival date & time 08/22/13  2210 History   First MD Initiated Contact with Patient 08/22/13 2305     Chief Complaint  Patient presents with  . Loss of Vision     (Consider location/radiation/quality/duration/timing/severity/associated sxs/prior Treatment) HPI Comments: 53 year old male no past medical history, on no medications, presents with a complaint of visual loss.  At 9:00 PM the patient was working on a computer at his work place when he felt as though his vision went white, these areas were initially small but rapidly became better to the point where he felt like he was losing his vision completely. This was bilateral, nothing seemed to make this better or worse and was associated eventually with some numbness to the top of the head as well as memory loss. He states that his family member who was driving the car from work to home was asking him questions about friends and close family members which the patient does intermittently, he was unable to recollect to these people were. The patient states that his symptoms have slowly resolved at this time he has no visual changes, no memory loss and no numbness to his head. He states that his bilateral arms felt weak he did not pay attention if one side was worse than the other. He recollects over the last several months he has had discreet episodes of visual loss or similar to this but did not last as long and resolved completely. He notes that each time this has happened he felt as though his blood pressure might be low and took some salt in the form of pickle juice, his symptoms would gradually improve over 30 or 40 minutes in the past.  The history is provided by the patient.    History reviewed. No pertinent past medical history. Past Surgical History  Procedure Laterality Date  . Laparoscopic appendectomy  12/02/2011    Procedure: APPENDECTOMY LAPAROSCOPIC;  Surgeon: Madilyn Hook, DO;  Location: [facility];  Service:  General;  Laterality: N/A;  . Appendectomy  12/01/11    lap appy   Family History  Problem Relation Age of Onset  . Hypertension Mother   . Hypertension Father    History  Substance Use Topics  . Smoking status: Never Smoker   . Smokeless tobacco: Never Used  . Alcohol Use: No    Review of Systems  All other systems reviewed and are negative.     Allergies  Wellbutrin  Home Medications   Prior to Admission medications   Not on File   BP 121/76  Pulse 65  Resp 16  Ht 5\' 6"  (1.676 m)  Wt 160 lb (72.576 kg)  BMI 25.84 kg/m2  SpO2 99% Physical Exam  Nursing note and vitals reviewed. Constitutional: He appears well-developed and well-nourished. No distress.  HENT:  Head: Normocephalic and atraumatic.  Mouth/Throat: Oropharynx is clear and moist. No oropharyngeal exudate.  Eyes: Conjunctivae and EOM are normal. Pupils are equal, round, and reactive to light. Right eye exhibits no discharge. Left eye exhibits no discharge. No scleral icterus.  Neck: Normal range of motion. Neck supple. No JVD present. No thyromegaly present.  Cardiovascular: Normal rate, regular rhythm, normal heart sounds and intact distal pulses.  Exam reveals no gallop and no friction rub.   No murmur heard. No carotid bruits  Pulmonary/Chest: Effort normal and breath sounds normal. No respiratory distress. He has no wheezes. He has no rales.  Abdominal: Soft. Bowel sounds are normal. He exhibits  no distension and no mass. There is no tenderness.  Musculoskeletal: Normal range of motion. He exhibits no edema and no tenderness.  Lymphadenopathy:    He has no cervical adenopathy.  Neurological: He is alert. Coordination normal.  Speech is clear, cranial nerves III through XII are intact, memory is intact, strength is normal in all 4 extremities including grips, sensation is intact to light touch and pinprick in all 4 extremities. Coordination as tested by finger-nose-finger is normal, no limb ataxia.  Normal gait, normal reflexes at the patellar tendons bilaterally  Skin: Skin is warm and dry. No rash noted. No erythema.  Psychiatric: He has a normal mood and affect. His behavior is normal.    ED Course  Procedures (including critical care time) Labs Review Labs Reviewed  CBC - Abnormal; Notable for the following:    HCT 38.2 (*)    All other components within normal limits  DIFFERENTIAL  COMPREHENSIVE METABOLIC PANEL  PROTIME-INR  APTT  URINALYSIS, ROUTINE W REFLEX MICROSCOPIC  ETHANOL  URINE RAPID DRUG SCREEN ([place])  I-STAT TROPOININ, ED  I-STAT CHEM 8, ED  I-STAT TROPOININ, ED  CBG MONITORING, ED    Imaging Review Ct Head (brain) Wo Contrast  08/22/2013   CLINICAL DATA:  Memory loss, vision loss.  EXAM: CT HEAD WITHOUT CONTRAST  TECHNIQUE: Contiguous axial images were obtained from the base of the skull through the vertex without intravenous contrast.  COMPARISON:  CT of the head report dated May 29, 2001 though images are not available for direct comparison.  FINDINGS: The ventricles and sulci are normal. No intraparenchymal hemorrhage, mass effect nor midline shift. No acute large vascular territory infarcts.  No abnormal extra-axial fluid collections. Basal cisterns are patent.  No skull fracture. The included ocular globes and orbital contents are non-suspicious. The mastoid aircells and included paranasal sinuses are well-aerated.  IMPRESSION: No acute intracranial process. Normal noncontrast CT of the head for age.   Electronically Signed   By: Elon Alas   On: 08/22/2013 23:13     EKG Interpretation   Date/Time:  Monday August 22 2013 22:18:21 EDT Ventricular Rate:  75 PR Interval:  148 QRS Duration: 86 QT Interval:  396 QTC Calculation: 442 R Axis:   73 Text Interpretation:  Normal sinus rhythm Normal ECG since last tracing no  significant change Confirmed by Akshay Spang  MD, [place] ([id number]) on 08/22/2013  11:19:13 PM      MDM   Final diagnoses:   TIA (transient ischemic attack)    The patient has a completely normal neurologic exam at this time, he has normal visual acuity and peripheral visual fields, strength sensation and coordination are all normal as is his memory and speech pattern. His EKG is normal, CT scan is normal, labs thus far are normal. Will discuss with neurology, patient likely is having escalation in his TIA pattern over the last several months.  The patient has had normal laboratory workup, normal CT scan of the head, discussed the patient's care with the neurologist Dr. Leonel Ramsay who agrees that the patient has likely had a transient ischemic attack and would benefit from admission to the hospital for further evaluation with ongoing testing.  Discussed the patient's care with Dr. Posey Pronto who will admit the patient to hospital and requests holding orders.  Johnna Acosta, MD 08/23/13 [phone]

## 2013-08-23 ENCOUNTER — Ambulatory Visit: Payer: BC Managed Care – PPO | Admitting: Internal Medicine

## 2013-08-23 ENCOUNTER — Observation Stay (HOSPITAL_COMMUNITY): Payer: BC Managed Care – PPO

## 2013-08-23 DIAGNOSIS — H538 Other visual disturbances: Secondary | ICD-10-CM

## 2013-08-23 DIAGNOSIS — I369 Nonrheumatic tricuspid valve disorder, unspecified: Secondary | ICD-10-CM

## 2013-08-23 DIAGNOSIS — G459 Transient cerebral ischemic attack, unspecified: Secondary | ICD-10-CM | POA: Insufficient documentation

## 2013-08-23 DIAGNOSIS — R488 Other symbolic dysfunctions: Secondary | ICD-10-CM

## 2013-08-23 LAB — LIPID PANEL
Cholesterol: 196 mg/dL (ref 0–200)
HDL: 42 mg/dL (ref >39)
LDL Cholesterol: 133 mg/dL — ABNORMAL HIGH (ref 0–99)
Total CHOL/HDL Ratio: 4.7 RATIO
Triglycerides: 105 mg/dL (ref <150)
VLDL: 21 mg/dL (ref 0–40)

## 2013-08-23 LAB — BASIC METABOLIC PANEL
BUN: 14 mg/dL (ref 6–23)
CO2: 23 mEq/L (ref 19–32)
Calcium: 8.7 mg/dL (ref 8.4–10.5)
Chloride: 105 mEq/L (ref 96–112)
Creatinine, Ser: 0.84 mg/dL (ref 0.50–1.35)
GFR calc Af Amer: >90 mL/min (ref >90)
GFR calc non Af Amer: >90 mL/min (ref >90)
Glucose, Bld: 106 mg/dL — ABNORMAL HIGH (ref 70–99)
Potassium: 3.9 mEq/L (ref 3.7–5.3)
Sodium: 139 mEq/L (ref 137–147)

## 2013-08-23 LAB — URINALYSIS, ROUTINE W REFLEX MICROSCOPIC
Bilirubin Urine: NEGATIVE
Glucose, UA: NEGATIVE mg/dL
Hgb urine dipstick: NEGATIVE
Ketones, ur: NEGATIVE mg/dL
Leukocytes, UA: NEGATIVE
Nitrite: NEGATIVE
Protein, ur: NEGATIVE mg/dL
Specific Gravity, Urine: 1.025 (ref 1.005–1.030)
Urobilinogen, UA: 1 mg/dL (ref 0.0–1.0)
pH: 6 (ref 5.0–8.0)

## 2013-08-23 LAB — RAPID URINE DRUG SCREEN, HOSP PERFORMED
Amphetamines: NOT DETECTED
Barbiturates: NOT DETECTED
Benzodiazepines: NOT DETECTED
Cocaine: NOT DETECTED
Opiates: NOT DETECTED
Tetrahydrocannabinol: NOT DETECTED

## 2013-08-23 LAB — CBC WITH DIFFERENTIAL/PLATELET
Basophils Absolute: 0 10*3/uL (ref 0.0–0.1)
Basophils Relative: 1 % (ref 0–1)
Eosinophils Absolute: 0.1 10*3/uL (ref 0.0–0.7)
Eosinophils Relative: 2 % (ref 0–5)
HCT: 36.5 % — ABNORMAL LOW (ref 39.0–52.0)
Hemoglobin: 12.5 g/dL — ABNORMAL LOW (ref 13.0–17.0)
Lymphocytes Relative: 37 % (ref 12–46)
Lymphs Abs: 2.4 10*3/uL (ref 0.7–4.0)
MCH: 29.3 pg (ref 26.0–34.0)
MCHC: 34.2 g/dL (ref 30.0–36.0)
MCV: 85.5 fL (ref 78.0–100.0)
Monocytes Absolute: 0.5 10*3/uL (ref 0.1–1.0)
Monocytes Relative: 8 % (ref 3–12)
Neutro Abs: 3.5 10*3/uL (ref 1.7–7.7)
Neutrophils Relative %: 52 % (ref 43–77)
Platelets: 209 10*3/uL (ref 150–400)
RBC: 4.27 MIL/uL (ref 4.22–5.81)
RDW: 12.7 % (ref 11.5–15.5)
WBC: 6.6 10*3/uL (ref 4.0–10.5)

## 2013-08-23 LAB — GLUCOSE, CAPILLARY
Glucose-Capillary: 99 mg/dL (ref 70–99)
Glucose-Capillary: 99 mg/dL (ref 70–99)

## 2013-08-23 LAB — HEMOGLOBIN A1C
Hgb A1c MFr Bld: 5.7 % — ABNORMAL HIGH (ref <5.7)
Mean Plasma Glucose: 117 mg/dL — ABNORMAL HIGH (ref <117)

## 2013-08-23 LAB — C-REACTIVE PROTEIN: CRP: <0.5 mg/dL — ABNORMAL LOW (ref <0.60)

## 2013-08-23 LAB — SEDIMENTATION RATE: Sed Rate: 9 mm/hr (ref 0–16)

## 2013-08-23 MED ORDER — STROKE: EARLY STAGES OF RECOVERY BOOK
Freq: Once | Status: AC
  Administered 2013-08-23: 07:00:00
  Filled 2013-08-23: qty 1

## 2013-08-23 MED ORDER — ENOXAPARIN SODIUM 40 MG/0.4ML ~~LOC~~ SOLN
40.0000 mg | SUBCUTANEOUS | Status: DC
  Administered 2013-08-23 – 2013-08-24 (×2): 40 mg via SUBCUTANEOUS
  Filled 2013-08-23 (×2): qty 0.4

## 2013-08-23 MED ORDER — ASPIRIN EC 81 MG PO TBEC
81.0000 mg | DELAYED_RELEASE_TABLET | Freq: Every day | ORAL | Status: DC
  Administered 2013-08-24: 81 mg via ORAL
  Filled 2013-08-23: qty 1

## 2013-08-23 MED ORDER — ONDANSETRON HCL 4 MG/2ML IJ SOLN: 4.0000 mg | Freq: Three times a day (TID) | INTRAMUSCULAR | Status: AC | PRN

## 2013-08-23 MED ORDER — ASPIRIN 325 MG PO TABS
325.0000 mg | ORAL_TABLET | Freq: Every day | ORAL | Status: DC
  Administered 2013-08-23: 325 mg via ORAL
  Filled 2013-08-23: qty 1

## 2013-08-23 NOTE — Progress Notes (Signed)
Stroke Team Progress Note  HISTORY Todd [name] is a 53 y.o. male with a history of palpitations in the past who underwent a cardiac workup 5 years ago with no signs of problems. He is otherwise fairly healthy. He was working on the computer earlier tonight 08/22/2013 at 9p when he had blurred vision in the form of spots in both eyes that lasted for approximately one hour. In addition to this, he noticed that he was unable to read and would have difficulty coming up with names of people including people that he knew quite well. He wondered if his blood pressure was low and therefore he ate some pickles thinking that this might help. When asked about symptoms of low blood pressure, he denies any symptoms of lightheadedness or feeling of being close to passing out.   In the ED, Symptoms have completely resolved. He denies any headache. He has had 2 episodes in the past of spots in his vision, but they were nothing like the one where he had today.   Patient was not administered TPA secondary to symptoms resolved. He was admitted for further evaluation and treatment.  SUBJECTIVE His wife Engineer, site) is at the bedside. He is a [place] arabic.  Overall he feels his condition is gradually improving.   OBJECTIVE Most recent Vital Signs: Filed Vitals:   08/23/13 0200 08/23/13 0255 08/23/13 0544 08/23/13 1105  BP: 119/75 110/75 104/67 120/76  Pulse: 62 65 73 58  Temp:  97.4 F (36.3 C) 98.5 F (36.9 C) 97.9 F (36.6 C)  TempSrc:  Oral Oral Oral  Resp: 18 18 18 18   Height:  5\' 6"  (1.676 m)    Weight:  77.202 kg (170 lb 3.2 oz)    SpO2: 100% 97% 97% 97%   CBG (last 3)   Recent Labs  08/23/13 0642  GLUCAP 99    IV Fluid Intake:     MEDICATIONS  . aspirin  325 mg Oral Daily  . enoxaparin (LOVENOX) injection  40 mg Subcutaneous Q24H   PRN:  ondansetron (ZOFRAN) IV  Diet:  Cardiac thin liquids Activity:   Bathroom privileges with assistance DVT Prophylaxis:  Lovenox 40 mg sq daily    CLINICALLY SIGNIFICANT STUDIES Basic Metabolic Panel:   Recent Labs Lab 08/22/13 2221 08/22/13 2331 08/23/13 0700  NA 141 143 139  K 4.4 4.3 3.9  CL 106 102 105  CO2 26  --  23  GLUCOSE 90 96 106*  BUN 17 17 14   CREATININE 0.98 1.00 0.84  CALCIUM 9.0  --  8.7   Liver Function Tests:   Recent Labs Lab 08/22/13 2221  AST 21  ALT 20  ALKPHOS 56  BILITOT 0.8  PROT 7.2  ALBUMIN 3.9   CBC:   Recent Labs Lab 08/22/13 2221 08/22/13 2331 08/23/13 0700  WBC 7.1  --  6.6  NEUTROABS 3.9  --  3.5  HGB 13.3 14.3 12.5*  HCT 38.2* 42.0 36.5*  MCV 85.8  --  85.5  PLT 224  --  209   Coagulation:   Recent Labs Lab 08/22/13 2326  LABPROT 13.4  INR 1.04   Cardiac Enzymes: No results found for this basename: CKTOTAL, CKMB, CKMBINDEX, TROPONINI,  in the last 168 hours Urinalysis:   Recent Labs Lab 08/23/13 0048  COLORURINE YELLOW  LABSPEC 1.025  PHURINE 6.0  GLUCOSEU NEGATIVE  HGBUR NEGATIVE  BILIRUBINUR NEGATIVE  KETONESUR NEGATIVE  PROTEINUR NEGATIVE  UROBILINOGEN 1.0  NITRITE NEGATIVE  LEUKOCYTESUR NEGATIVE  Lipid Panel    Component Value Date/Time   CHOL 196 08/23/2013 0700   TRIG 105 08/23/2013 0700   HDL 42 08/23/2013 0700   CHOLHDL 4.7 08/23/2013 0700   VLDL 21 08/23/2013 0700   LDLCALC 133* 08/23/2013 0700   HgbA1C  No results found for this basename: HGBA1C    Urine Drug Screen:     Component Value Date/Time   LABOPIA NONE DETECTED 08/23/2013 0048   COCAINSCRNUR NONE DETECTED 08/23/2013 0048   LABBENZ NONE DETECTED 08/23/2013 0048   AMPHETMU NONE DETECTED 08/23/2013 0048   THCU NONE DETECTED 08/23/2013 0048   LABBARB NONE DETECTED 08/23/2013 0048    Alcohol Level:   Recent Labs Lab 08/22/13 2326  ETH <11     CT of the brain  08/22/2013   No acute intracranial process. Normal noncontrast CT of the head for age.  MRI of the brain    MRA of the brain    2D Echocardiogram  EF 60-65% with no source of embolus.   Carotid Doppler  No evidence of  hemodynamically significant internal carotid artery stenosis. Vertebral artery flow is antegrade.   CXR    EKG  normal sinus rhythm. For complete results please see formal report.   Therapy Recommendations no therapy needs  Physical Exam   Middle aged [place] male not in distress.Awake alert. Afebrile. Head is nontraumatic. Neck is supple without bruit. Hearing is normal. Cardiac exam no murmur or gallop. Lungs are clear to auscultation. Distal pulses are well felt. Neurological Exam ;  Awake  Alert oriented x 3. Normal speech and language.eye movements full without nystagmus.fundi were not visualized. Vision acuity and fields appear normal. Hearing is normal. Palatal movements are normal. Face symmetric. Tongue midline. Normal strength, tone, reflexes and coordination. Normal sensation. Gait deferred. ASSESSMENT [name] is a 53 y.o. male presenting with transient visual loss with memory deficits. Imaging pending. Suspect:  TIA vs migraine.   On no antithrombotics prior to admission. Now on aspirin 325 mg orally every day for secondary stroke prevention. Patient with no resultant neuro deficits. Stroke work up underway.   Snores, ? Sleep apnea  LDL pending  Hospital day # 1  TREATMENT/PLAN  Decrease aspirin to aspirin 81 mg orally every day for secondary stroke prevention.  followup MRI, MRA, LDL, HgbA1c  OP sleep study to eval for obstructive sleep apnea   SIGNED Burnetta Sabin, MSN, RN, ANVP-BC, ANP-BC, GNP-BC [facility] Pager: [phone] 08/23/2013 11:45 AM   I have personally obtained a history, examined the patient, evaluated imaging results, and formulated the assessment and plan of care. I agree with the above.  Antony Contras, MD  To contact Stroke Continuity provider, please refer to [email or web address] After hours, contact General Neurology

## 2013-08-23 NOTE — Consult Note (Signed)
Neurology Consultation Reason for Consult: Transient visual changes Referring Physician: Sabra Heck, B.  CC: Transient visual changes  History is obtained from: Patient  HPI: Todd [name] is a 53 y.o. male with a history of palpitations in the past who underwent a cardiac workup 5 years ago with no signs of problems. He is otherwise fairly healthy. He was working on the computer earlier [employer] when he had blurred vision in the form of spots in both eyes that lasted for approximately one hour. In addition to this, he noticed that he was unable to read and would have difficulty coming up with names of people including people that he knew quite well. He wondered if his blood pressure was low and therefore he ate some pickles thinking that this might help. When asked about symptoms of low blood pressure, he denies any symptoms of lightheadedness or feeling of being close to passing out.  At the current time, Symptoms have completely resolved. He denies any headache. He has had 2 episodes in the past of spots in his vision, but they were nothing like the one where he had today.    LKW: 9 PM  tpa given?: no, symptoms resolved    ROS: A 14 point ROS was performed and is negative except as noted in the HPI.  Past medical history: Palpitations   Family History: No history of migraine   Social History: Tob: Denies   Exam: Current vital signs: BP 121/76  Pulse 65  Resp 16  Ht 5\' 6"  (1.676 m)  Wt 72.576 kg (160 lb)  BMI 25.84 kg/m2  SpO2 99% Vital signs in last 24 hours: Pulse Rate:  [65-70] 65 (06/01 2330) Resp:  [16-18] 16 (06/01 2330) BP: (121-125)/(76-84) 121/76 mmHg (06/01 2330) SpO2:  [99 %-100 %] 99 % (06/01 2330) Weight:  [72.576 kg (160 lb)] 72.576 kg (160 lb) (06/01 2218)  General: In bed, NAD  CV: Regular rate and rhythm Mental Status: Patient is awake, alert, oriented to person, place, month, year, and situation. Immediate and remote memory are intact. Patient is able to  give a clear and coherent history. No signs of aphasia or neglect Cranial Nerves: II: Visual Fields are full. Pupils are equal, round, and reactive to light.  Discs are difficult to visualize. III,IV, VI: EOMI without ptosis or diploplia.  V: Facial sensation is symmetric to temperature VII: Facial movement is symmetric.  VIII: hearing is intact to voice X: Uvula elevates symmetrically XI: Shoulder shrug is symmetric. XII: tongue is midline without atrophy or fasciculations.  Motor: Tone is normal. Bulk is normal. 5/5 strength was present in all four extremities.  Sensory: Sensation is symmetric to light touch and temperature in the arms and legs. Deep Tendon Reflexes: 2+ and symmetric in the biceps and patellae.  Plantars: Toes are downgoing bilaterally.  Cerebellar: FNF and HKS are intact bilaterally Gait: Not assessed due to multiple medical monitors in ED setting.    I have reviewed labs in epic and the results pertinent to this consultation are: CMP, CBC-unremarkable   I have reviewed the images obtained:CT head-unremarkable   Impression: 53 year old male presenting with transient visual obscuration, alexia, anomic aphasia. This constellation of symptoms is concerning for a TIA the dominant PCA territory. Also possible could be migraine aura without headache, but without any history of migraines, any headache associated with this event, or any family history of migraine, I favor treating this as a TIA would be the more prudent course of action.   Recommendations: 1.  HgbA1c, fasting lipid panel 2. MRI, MRA  of the brain without contrast 3. Frequent neuro checks 4. Echocardiogram 5. Carotid dopplers 6. Prophylactic therapy-Antiplatelet med: Aspirin - dose 325mg  PO or 300mg  PR 7. Risk factor modification 8. Telemetry monitoring   Roland Rack, MD [facility] [phone]  If 7pm- 7am, please page neurology on call as listed in [place].

## 2013-08-23 NOTE — Progress Notes (Signed)
*  PRELIMINARY RESULTS* Vascular Ultrasound Carotid Duplex (Doppler) has been completed.  Preliminary findings: Bilateral:  1-39% ICA stenosis.  Vertebral artery flow is antegrade.      Landry Mellow, RDMS, RVT  08/23/2013, 10:58 AM

## 2013-08-23 NOTE — Progress Notes (Signed)
UR completed 

## 2013-08-23 NOTE — Progress Notes (Signed)
  Echocardiogram 2D Echocardiogram has been performed.  Todd [name] 08/23/2013, 10:53 AM

## 2013-08-23 NOTE — H&P (Signed)
[facility] History and Physical  Patient: Todd [name]  ERX:[id number]  DOB: 1960/09/21  DOS: the patient was seen and examined on 08/23/2013 PCP: PROVIDER NOT IN SYSTEM  Chief Complaint: Blurred vision  HPI: Todd [name] is a 53 y.o. male with no significant Past medical history. The patient presented with complaints of visual changes. He mentions he was at his office and was sitting on a desk and all of a sudden started seeing white spots. The white spots were not moving and gradually getting bigger. He can found that they were present in both his eyes. Eventually the white spots improved and after a few minutes. And then he decided to drive back home. At which time been remembering names of his family members. By the time his arrival in the ED this was resolved. He did not have any difficulty with speaking no dizziness no lightheadedness no fever no trauma no injury, shortness of breath, no diarrhea no vomiting no burning urination. He does not take any medication. Denies any alcohol or smoking abuse.  The patient is coming from home. And at his baseline independent for most of his ADL.  Review of Systems: as mentioned in the history of present illness.  A Comprehensive review of the other systems is negative.  History reviewed. No pertinent past medical history. Past Surgical History  Procedure Laterality Date  . Laparoscopic appendectomy  12/02/2011    Procedure: APPENDECTOMY LAPAROSCOPIC;  Surgeon: Madilyn Hook, DO;  Location: [facility];  Service: General;  Laterality: N/A;  . Appendectomy  12/01/11    lap appy   Social History:  reports that he has never smoked. He has never used smokeless tobacco. He reports that he does not drink alcohol or use illicit drugs.  Allergies  Allergen Reactions  . Wellbutrin [Bupropion Hcl] Itching    Family History  Problem Relation Age of Onset  . Hypertension Mother   . Hypertension Father     Prior to Admission medications   Not on File     Physical Exam: Filed Vitals:   08/23/13 0100 08/23/13 0130 08/23/13 0200 08/23/13 0255  BP: 129/83 122/83 119/75 110/75  Pulse: 76 63 62 65  Temp:    97.4 F (36.3 C)  TempSrc:    Oral  Resp: _0 Height:    _1  (1.676 m)  Weight:    77.202 kg (170 lb 3.2 oz)  SpO2: 100% 99% 100% 97%    General: Alert, Awake and Oriented to Time, Place and Person. Appear in mild distress Eyes: PERRL ENT: Oral Mucosa clear moist. Neck: No JVD Cardiovascular: S1 and S2 Present, no Murmur, Peripheral Pulses Present Respiratory: Bilateral Air entry equal and Decreased, Clear to Auscultation,  No Crackles, no wheezes Abdomen: Bowel Sound Present, Soft and Non tender Skin: No Rash Extremities: No Pedal edema, no calf tenderness Neurologic: Grossly no focal neuro deficit. Mental status AAOx3, speech normal, attention normal, Cranial Nerves PERRL, EOM normal and present, facial sensation to light touch present: , Motor strength bilateral equal strength 5/5, Sensation present to light touch, reflexes present knee and biceps, babinski negative, Proprioception normal, Cerebellar test normal finger nose finger.  Labs on Admission:  CBC:  Recent Labs Lab 08/22/13 2221 08/22/13 2331  WBC 7.1  --   NEUTROABS 3.9  --   HGB 13.3 14.3  HCT 38.2* 42.0  MCV 85.8  --   PLT 224  --     CMP     Component Value Date/Time  NA 143 08/22/2013 2331   K 4.3 08/22/2013 2331   CL 102 08/22/2013 2331   CO2 26 08/22/2013 2221   GLUCOSE 96 08/22/2013 2331   BUN 17 08/22/2013 2331   CREATININE 1.00 08/22/2013 2331   CALCIUM 9.0 08/22/2013 2221   PROT 7.2 08/22/2013 2221   ALBUMIN 3.9 08/22/2013 2221   AST 21 08/22/2013 2221   ALT 20 08/22/2013 2221   ALKPHOS 56 08/22/2013 2221   BILITOT 0.8 08/22/2013 2221   GFRNONAA >90 08/22/2013 2221   GFRAA >90 08/22/2013 2221    No results found for this basename: LIPASE, AMYLASE,  in the last 168 hours No results found for this basename: AMMONIA,  in the last 168 hours  No  results found for this basename: CKTOTAL, CKMB, CKMBINDEX, TROPONINI,  in the last 168 hours BNP (last 3 results) No results found for this basename: PROBNP,  in the last 8760 hours  Radiological Exams on Admission: Ct Head (brain) Wo Contrast  08/22/2013   CLINICAL DATA:  Memory loss, vision loss.  EXAM: CT HEAD WITHOUT CONTRAST  TECHNIQUE: Contiguous axial images were obtained from the base of the skull through the vertex without intravenous contrast.  COMPARISON:  CT of the head report dated May 29, 2001 though images are not available for direct comparison.  FINDINGS: The ventricles and sulci are normal. No intraparenchymal hemorrhage, mass effect nor midline shift. No acute large vascular territory infarcts.  No abnormal extra-axial fluid collections. Basal cisterns are patent.  No skull fracture. The included ocular globes and orbital contents are non-suspicious. The mastoid aircells and included paranasal sinuses are well-aerated.  IMPRESSION: No acute intracranial process. Normal noncontrast CT of the head for age.   Electronically Signed   By: Elon Alas   On: 08/22/2013 23:13    EKG: Independently reviewed. normal EKG, normal sinus rhythm.  Assessment/Plan Principal Problem:   TIA (transient ischemic attack) Active Problems:   Blurred vision   Anomia   1. TIA (transient ischemic attack) The patient is presenting with complaints of blurred vision. He also had some heme no anemia. Neurologic has evaluated the patient and recommended further workup he developed an MRI of the brain echocardiogram and carotid Doppler. Start the patient on aspirin. I would also obtain ESR ANA and CRP for vasculitis workup. Monitor on telemetry. Serial neuro checks.  Consults: Neurology  DVT Prophylaxis: subcutaneous Heparin Nutrition: Cardiac  Code Status: Full  Family Communication: Significant other was present at bedside, opportunity was given to ask question and all questions were  answered satisfactorily at the time of interview. Disposition: Admitted to observation in telemetry unit.  Author: Berle Mull, MD [facility] Pager: [phone] 08/23/2013, 5:19 AM    If 7PM-7AM, please contact night-coverage [email or web address] Password [email or web address]

## 2013-08-23 NOTE — Progress Notes (Signed)
Nutrition Brief Note  Malnutrition Screening Tool result is inaccurate. Pt reports he has had a good appetite and been eating well. He denies weight loss stating his weight has been the same for 3 years. Please consult if nutrition needs are identified.  Pryor Ochoa RD, LDN Inpatient Clinical Dietitian Pager: [phone] After Hours Pager: [phone]

## 2013-08-23 NOTE — Progress Notes (Signed)
[facility] PROGRESS NOTE  Todd [name] GUR:[id number] DOB: April 13, 1960 DOA: 08/22/2013 PCP: PROVIDER NOT IN SYSTEM I have seen and examined pt who is a 53 year old admitted this am by Dr Posey Pronto with no significant past medical history presenting with blurry vision and admitted for CVA workup. CT of head shows no acute intracranial findings. Awaiting MRI and carotid Doppler 2-D echo with EF of 60-65% normal wall motion and no atrial septal defect or patent foramina ovale. Will continue aspirin and current management plan as per Dr. Posey Pronto and followup on above pending studies.    [facility] Pager [phone]. If 7PM-7AM, please contact night-coverage at [email or web address], password [facility] 08/23/2013, 3:55 PM  LOS: 1 day

## 2013-08-23 NOTE — ED Notes (Signed)
Report called to 4N

## 2013-08-24 ENCOUNTER — Other Ambulatory Visit: Payer: Self-pay | Admitting: Nurse Practitioner

## 2013-08-24 DIAGNOSIS — G459 Transient cerebral ischemic attack, unspecified: Secondary | ICD-10-CM

## 2013-08-24 DIAGNOSIS — I639 Cerebral infarction, unspecified: Secondary | ICD-10-CM

## 2013-08-24 DIAGNOSIS — G43901 Migraine, unspecified, not intractable, with status migrainosus: Principal | ICD-10-CM

## 2013-08-24 DIAGNOSIS — G43109 Migraine with aura, not intractable, without status migrainosus: Secondary | ICD-10-CM | POA: Diagnosis present

## 2013-08-24 LAB — ANA: Anti Nuclear Antibody(ANA): NEGATIVE

## 2013-08-24 MED ORDER — ATORVASTATIN CALCIUM 10 MG PO TABS
10.0000 mg | ORAL_TABLET | Freq: Every day | ORAL | Status: DC
  Filled 2013-08-24: qty 1

## 2013-08-24 MED ORDER — ATORVASTATIN CALCIUM 10 MG PO TABS: 10.0000 mg | ORAL_TABLET | Freq: Every day | ORAL | Status: DC

## 2013-08-24 MED ORDER — ASPIRIN 81 MG PO TBEC
81.0000 mg | DELAYED_RELEASE_TABLET | Freq: Every day | ORAL | Status: DC
Start: 2013-08-24 — End: 2013-10-20

## 2013-08-24 NOTE — Discharge Summary (Signed)
Physician Discharge Summary  Todd [name] AJO:[id number] DOB: 1960/10/11 DOA: 08/22/2013  PCP: PROVIDER NOT IN SYSTEM  Admit date: 08/22/2013 Discharge date: 08/24/2013  Time spent: 35 minutes  Recommendations for Outpatient Follow-up:  1. Symptoms likely related to Migraine Headaches. We discussed pharmacologic therapy for migraines however was not interested in taking more medications, and would discuss treatment options further with his PCP  Discharge Diagnoses:  Principal Problem:   Migraine with status migrainosus Active Problems:   Blurred vision   Anomia   Discharge Condition: Improved  Diet recommendation: Heart Healthy  [facility] Weights   08/22/13 2218 08/23/13 0255  Weight: 72.576 kg (160 lb) 77.202 kg (170 lb 3.2 oz)    History of present illness:  Todd [name] is a 53 y.o. male with no significant Past medical history.  The patient presented with complaints of visual changes. He mentions he was at his office and was sitting on a desk and all of a sudden started seeing white spots. The white spots were not moving and gradually getting bigger. He can found that they were present in both his eyes. Eventually the white spots improved and after a few minutes. And then he decided to drive back home. At which time been remembering names of his family members. By the time his arrival in the ED this was resolved. He did not have any difficulty with speaking no dizziness no lightheadedness no fever no trauma no injury, shortness of breath, no diarrhea no vomiting no burning urination.  He does not take any medication.  Denies any alcohol or smoking abuse.   Hospital Course:  Patient is a pleasant 53 year old with no significant past medical history who was admitted to the medicine service on 08/23/2013 presenting with complaints of visual changes characterized as white spots associated with blurry vision. He apparently had a transient episode of confusion where he had difficulty recalling  family members names. He grew concerned over this and presented to the emergency room where symptoms have resolved. He had a nonfocal neurologic examination on presentation. There was concern about the possibility of transient ischemic attack. CT scan of brain showed no acute intracranial process. He was further worked up with an MRI of brain which showed punctate focus of reduced diffusion within the left occipital lobe that could reflect acute ischemia. I discussed case with Dr. Leonie Man of the stroke team, who felt that symptoms likely represented complex migraine. He did recommend starting aspirin and statin therapy. Given clinical stability he was discharged in stable condition on 08/24/2013. Prior to discharge case manager was consulted to set patient up with a primary care provider for hospital followup. I discussed possible pharmacologic therapy for migraine headaches however he was interested in discussing migraine treatment further with his primary care provider in the outpatient setting.  Procedures:  Transthoracic echocardiogram performed on 08/23/2013 showing ejection fraction of 60-65% without wall motion abnormalities  Consultations:  Stroke team  Discharge Exam: Filed Vitals:   08/24/13 1022  BP: 115/61  Pulse: 63  Temp: 97.6 F (36.4 C)  Resp: 18    General: Patient appears well, in no acute distress, awake alert and oriented Cardiovascular: Regular rate and rhythm normal S1-S2 no murmurs rubs gallops Respiratory: Good auscultation bilaterally Abdomen: Soft nontender nondistended Neurological: Cranial nerves II through XII grossly intact, has 5 out of 5 muscle strength about alteration to sensation  Discharge Instructions You were cared for by a hospitalist during your hospital stay. If you have any questions about your discharge medications  or the care you received while you were in the hospital after you are discharged, you can call the unit and asked to speak with the  hospitalist on call if the hospitalist that took care of you is not available. Once you are discharged, your primary care physician will handle any further medical issues. Please note that NO REFILLS for any discharge medications will be authorized once you are discharged, as it is imperative that you return to your primary care physician (or establish a relationship with a primary care physician if you do not have one) for your aftercare needs so that they can reassess your need for medications and monitor your lab values.  Discharge Instructions   Call MD for:  difficulty breathing, headache or visual disturbances    Complete by:  As directed      Call MD for:  extreme fatigue    Complete by:  As directed      Call MD for:  persistant dizziness or light-headedness    Complete by:  As directed      Call MD for:  persistant nausea and vomiting    Complete by:  As directed      Call MD for:  severe uncontrolled pain    Complete by:  As directed      Call MD for:  temperature >100.4    Complete by:  As directed      Diet - low sodium heart healthy    Complete by:  As directed      Increase activity slowly    Complete by:  As directed             Medication List         aspirin 81 MG EC tablet  Take 1 tablet (81 mg total) by mouth daily.     atorvastatin 10 MG tablet  Commonly known as:  LIPITOR  Take 1 tablet (10 mg total) by mouth daily at 6 PM.       Allergies  Allergen Reactions  . Wellbutrin [Bupropion Hcl] Itching       Follow-up Information   Follow up with Forbes Cellar, MD. Schedule an appointment as soon as possible for a visit in 2 months. (Stroke Clinic)    Specialties:  Neurology, Radiology   Contact information:   [address] [place] [address] [phone]        The results of significant diagnostics from this hospitalization (including imaging, microbiology, ancillary and laboratory) are listed below for reference.     Significant Diagnostic Studies: Ct Head (brain) Wo Contrast  08/22/2013   CLINICAL DATA:  Memory loss, vision loss.  EXAM: CT HEAD WITHOUT CONTRAST  TECHNIQUE: Contiguous axial images were obtained from the base of the skull through the vertex without intravenous contrast.  COMPARISON:  CT of the head report dated May 29, 2001 though images are not available for direct comparison.  FINDINGS: The ventricles and sulci are normal. No intraparenchymal hemorrhage, mass effect nor midline shift. No acute large vascular territory infarcts.  No abnormal extra-axial fluid collections. Basal cisterns are patent.  No skull fracture. The included ocular globes and orbital contents are non-suspicious. The mastoid aircells and included paranasal sinuses are well-aerated.  IMPRESSION: No acute intracranial process. Normal noncontrast CT of the head for age.   Electronically Signed   By: Elon Alas   On: 08/22/2013 23:13   Mri Brain Without Contrast  08/24/2013   CLINICAL DATA:  Transient ischemic attack,  blurry vision.  EXAM: MRI HEAD WITHOUT CONTRAST  MRA HEAD WITHOUT CONTRAST  TECHNIQUE: Multiplanar, multiecho pulse sequences of the brain and surrounding structures were obtained without intravenous contrast. Angiographic images of the head were obtained using MRA technique without contrast.  COMPARISON:  CT of the head August 22, 2013  FINDINGS: MRI HEAD FINDINGS  Punctate focus of reduced diffusion in the left occipital lobe, axial 15/60 with equivocal low ADC values. . No susceptibility artifact to suggest hemorrhage.  Ventricles and sulci are normal for patient's age. A few scattered subcentimeter supratentorial white matter FLAIR T2 hyperintensities are within normal range for patient's age, 65 of which likely reflects a perivascular space. No intraparenchymal mass effect, midline shift nor mass lesions.  No abnormal extra-axial fluid collections. Prominent superior cerebellar cistern may reflect arachnoid cyst  without mass effect. Ocular globes and orbital contents are unremarkable. Trace ethmoid mucosal thickening without paranasal sinus air-fluid levels. Mastoid air cells are well aerated. All  MRA HEAD FINDINGS  Anterior circulation: Normal flow related enhancement of the included cervical, petrous, cavernous and supra clinoid internal carotid arteries. Patent anterior communicating artery. Normal flow related enhancement of the anterior and middle cerebral arteries, including more distal segments.  Posterior circulation: Left Vertebral artery is dominant. Basilar artery is patent, with normal flow related enhancement of the main branch vessels. Normal flow related enhancement of the posterior cerebral arteries, however there is at least 30-50% long segment stenosis of the left P3 branch.  No large vessel occlusion, aneurysm within the anterior nor posterior circulation.  IMPRESSION: MRI of brain: Punctate focus of reduced diffusion within left occipital lobe may reflect acute ischemia.  Minimal white matter changes are nonspecific, could reflect chronic small vessel ischemic disease, within normal range for patient's age.  MRA brain: 30 -50% long segment stenosis of left P3 branch likely reflects atherosclerosis. No large vessel occlusion.   Electronically Signed   By: Elon Alas   On: 08/24/2013 01:22   Mr Jodene Nam Head/brain Wo Cm  08/24/2013   CLINICAL DATA:  Transient ischemic attack, blurry vision.  EXAM: MRI HEAD WITHOUT CONTRAST  MRA HEAD WITHOUT CONTRAST  TECHNIQUE: Multiplanar, multiecho pulse sequences of the brain and surrounding structures were obtained without intravenous contrast. Angiographic images of the head were obtained using MRA technique without contrast.  COMPARISON:  CT of the head August 22, 2013  FINDINGS: MRI HEAD FINDINGS  Punctate focus of reduced diffusion in the left occipital lobe, axial 15/60 with equivocal low ADC values. . No susceptibility artifact to suggest hemorrhage.   Ventricles and sulci are normal for patient's age. A few scattered subcentimeter supratentorial white matter FLAIR T2 hyperintensities are within normal range for patient's age, 67 of which likely reflects a perivascular space. No intraparenchymal mass effect, midline shift nor mass lesions.  No abnormal extra-axial fluid collections. Prominent superior cerebellar cistern may reflect arachnoid cyst without mass effect. Ocular globes and orbital contents are unremarkable. Trace ethmoid mucosal thickening without paranasal sinus air-fluid levels. Mastoid air cells are well aerated. All  MRA HEAD FINDINGS  Anterior circulation: Normal flow related enhancement of the included cervical, petrous, cavernous and supra clinoid internal carotid arteries. Patent anterior communicating artery. Normal flow related enhancement of the anterior and middle cerebral arteries, including more distal segments.  Posterior circulation: Left Vertebral artery is dominant. Basilar artery is patent, with normal flow related enhancement of the main branch vessels. Normal flow related enhancement of the posterior cerebral arteries, however there is at least 30-50% long  segment stenosis of the left P3 branch.  No large vessel occlusion, aneurysm within the anterior nor posterior circulation.  IMPRESSION: MRI of brain: Punctate focus of reduced diffusion within left occipital lobe may reflect acute ischemia.  Minimal white matter changes are nonspecific, could reflect chronic small vessel ischemic disease, within normal range for patient's age.  MRA brain: 30 -50% long segment stenosis of left P3 branch likely reflects atherosclerosis. No large vessel occlusion.   Electronically Signed   By: Elon Alas   On: 08/24/2013 01:22    Microbiology: No results found for this or any previous visit (from the past 240 hour(s)).   Labs: Basic Metabolic Panel:  Recent Labs Lab 08/22/13 2221 08/22/13 2331 08/23/13 0700  NA 141 143 139  K  4.4 4.3 3.9  CL 106 102 105  CO2 26  --  23  GLUCOSE 90 96 106*  BUN 17 17 14   CREATININE 0.98 1.00 0.84  CALCIUM 9.0  --  8.7   Liver Function Tests:  Recent Labs Lab 08/22/13 2221  AST 21  ALT 20  ALKPHOS 56  BILITOT 0.8  PROT 7.2  ALBUMIN 3.9   No results found for this basename: LIPASE, AMYLASE,  in the last 168 hours No results found for this basename: AMMONIA,  in the last 168 hours CBC:  Recent Labs Lab 08/22/13 2221 08/22/13 2331 08/23/13 0700  WBC 7.1  --  6.6  NEUTROABS 3.9  --  3.5  HGB 13.3 14.3 12.5*  HCT 38.2* 42.0 36.5*  MCV 85.8  --  85.5  PLT 224  --  209   Cardiac Enzymes: No results found for this basename: CKTOTAL, CKMB, CKMBINDEX, TROPONINI,  in the last 168 hours BNP: BNP (last 3 results) No results found for this basename: PROBNP,  in the last 8760 hours CBG:  Recent Labs Lab 08/23/13 0642 08/23/13 1154  GLUCAP 99 99       Signed:  [facility] 08/24/2013, 10:57 AM

## 2013-08-24 NOTE — Care Management Note (Signed)
    Page 1 of 1   08/24/2013     2:19:20 PM CARE MANAGEMENT NOTE 08/24/2013  Patient:  SLATON, REASER   Account Number:  [id number]  Date Initiated:  08/24/2013  Documentation initiated by:  Lorne Skeens  Subjective/Objective Assessment:   Patient was admitted with vision loss, memory loss, heart fluttering     Action/Plan:   Will follow for discharge needs.   Anticipated DC Date:  08/24/2013   Anticipated DC Plan:  [place]  CM consult      Choice offered to / List presented to:             Status of service:  Completed, signed off Medicare Important Message given?   (If response is "NO", the following Medicare IM given date fields will be blank) Date Medicare IM given:   Date Additional Medicare IM given:    Discharge Disposition:  HOME/SELF CARE  Per UR Regulation:    If discussed at Long Length of Stay Meetings, dates discussed:    Comments:  08/24/13 Lorne Skeens RN, MSN, CM- CM consult recieved to offer PCP list to patient. Patient had already been discharged before consult was noted.  CM attempted to reach patient at the numbers listed, but voicemail box was full (unable to leave message).  Patient DOES have commercial insurance and is able to select a PCP of his choice within network.

## 2013-08-24 NOTE — Progress Notes (Signed)
Patient is discharged from room 4N16 at this time. Alert and in stable condition. IV site d/c'd as well as tele. Instructions read to patient and ambulate off unit with belongings.

## 2013-08-24 NOTE — Progress Notes (Signed)
Stroke Team Progress Note  HISTORY Todd [name] is a 53 y.o. male with a history of palpitations in the past who underwent a cardiac workup 5 years ago with no signs of problems. He is otherwise fairly healthy. He was working on the computer earlier tonight 08/22/2013 at 9p when he had blurred vision in the form of spots in both eyes that lasted for approximately one hour. In addition to this, he noticed that he was unable to read and would have difficulty coming up with names of people including people that he knew quite well. He wondered if his blood pressure was low and therefore he ate some pickles thinking that this might help. When asked about symptoms of low blood pressure, he denies any symptoms of lightheadedness or feeling of being close to passing out.   In the ED, Symptoms have completely resolved. He denies any headache. He has had 2 episodes in the past of spots in his vision, but they were nothing like the one where he had today.   Patient was not administered TPA secondary to symptoms resolved. He was admitted for further evaluation and treatment.  SUBJECTIVE His wife is at the bedside. He would like to go home soon as she has a MD appt across the street.   OBJECTIVE Most recent Vital Signs: Filed Vitals:   08/23/13 1404 08/23/13 1803 08/23/13 2103 08/24/13 0248  BP: 113/53 104/63 116/63 117/62  Pulse: 68 61 63 63  Temp: 97.8 F (36.6 C) 98.2 F (36.8 C) 97.9 F (36.6 C) 97.9 F (36.6 C)  TempSrc: Oral Oral Oral Oral  Resp: 18 18 18 20   Height:      Weight:      SpO2: 98% 98% 100% 97%   CBG (last 3)   Recent Labs  08/23/13 0642 08/23/13 1154  GLUCAP 99 99    IV Fluid Intake:     MEDICATIONS  . aspirin EC  81 mg Oral Daily  . enoxaparin (LOVENOX) injection  40 mg Subcutaneous Q24H   PRN:    Diet:  Cardiac thin liquids Activity:   Bathroom privileges with assistance DVT Prophylaxis:  Lovenox 40 mg sq daily   CLINICALLY SIGNIFICANT STUDIES Basic Metabolic  Panel:   Recent Labs Lab 08/22/13 2221 08/22/13 2331 08/23/13 0700  NA 141 143 139  K 4.4 4.3 3.9  CL 106 102 105  CO2 26  --  23  GLUCOSE 90 96 106*  BUN 17 17 14   CREATININE 0.98 1.00 0.84  CALCIUM 9.0  --  8.7   Liver Function Tests:   Recent Labs Lab 08/22/13 2221  AST 21  ALT 20  ALKPHOS 56  BILITOT 0.8  PROT 7.2  ALBUMIN 3.9   CBC:   Recent Labs Lab 08/22/13 2221 08/22/13 2331 08/23/13 0700  WBC 7.1  --  6.6  NEUTROABS 3.9  --  3.5  HGB 13.3 14.3 12.5*  HCT 38.2* 42.0 36.5*  MCV 85.8  --  85.5  PLT 224  --  209   Coagulation:   Recent Labs Lab 08/22/13 2326  LABPROT 13.4  INR 1.04   Cardiac Enzymes: No results found for this basename: CKTOTAL, CKMB, CKMBINDEX, TROPONINI,  in the last 168 hours Urinalysis:   Recent Labs Lab 08/23/13 0048  COLORURINE YELLOW  LABSPEC 1.025  PHURINE 6.0  GLUCOSEU NEGATIVE  HGBUR NEGATIVE  BILIRUBINUR NEGATIVE  KETONESUR NEGATIVE  PROTEINUR NEGATIVE  UROBILINOGEN 1.0  NITRITE NEGATIVE  LEUKOCYTESUR NEGATIVE   Lipid Panel  Component Value Date/Time   CHOL 196 08/23/2013 0700   TRIG 105 08/23/2013 0700   HDL 42 08/23/2013 0700   CHOLHDL 4.7 08/23/2013 0700   VLDL 21 08/23/2013 0700   LDLCALC 133* 08/23/2013 0700   HgbA1C  Lab Results  Component Value Date   HGBA1C 5.7* 08/23/2013    Urine Drug Screen:     Component Value Date/Time   LABOPIA NONE DETECTED 08/23/2013 0048   COCAINSCRNUR NONE DETECTED 08/23/2013 0048   LABBENZ NONE DETECTED 08/23/2013 0048   AMPHETMU NONE DETECTED 08/23/2013 0048   THCU NONE DETECTED 08/23/2013 0048   LABBARB NONE DETECTED 08/23/2013 0048    Alcohol Level:   Recent Labs Lab 08/22/13 2326  ETH <11     CT of the brain  08/22/2013   No acute intracranial process. Normal noncontrast CT of the head for age.  MRI of the brain  08/24/2013    Punctate focus of reduced diffusion within left occipital lobe may reflect acute ischemia.  Minimal white matter changes are nonspecific, could  reflect chronic small vessel ischemic disease, within normal range for patient's age.  MRA of the brain  08/24/2013    30 -50% long segment stenosis of left P3 branch likely reflects atherosclerosis. No large vessel occlusion.     2D Echocardiogram  EF 60-65% with no source of embolus.   Carotid Doppler  No evidence of hemodynamically significant internal carotid artery stenosis. Vertebral artery flow is antegrade.   EKG  normal sinus rhythm. For complete results please see formal report.   Therapy Recommendations no therapy needs  Physical Exam   Middle aged [place] male not in distress.Awake alert. Afebrile. Head is nontraumatic. Neck is supple without bruit. Hearing is normal. Cardiac exam no murmur or gallop. Lungs are clear to auscultation. Distal pulses are well felt. Neurological Exam ;  Awake  Alert oriented x 3. Normal speech and language.eye movements full without nystagmus.fundi were not visualized. Vision acuity and fields appear normal. Hearing is normal. Palatal movements are normal. Face symmetric. Tongue midline. Normal strength, tone, reflexes and coordination. Normal sensation. Gait deferred.  ASSESSMENT [name] is a 53 y.o. male presenting with transient visual loss with memory deficits. Imaging shows a diffusion abnormality that is not clearly a stroke - possible could be stroke vs DWI changes associated with complex migraine. On no antithrombotics prior to admission. Now on aspirin 81 mg orally every day for secondary stroke prevention. Patient with no resultant neuro deficits. Stroke work up completed.   Snores, ? Sleep apnea  Hyperlipidemia, LDL 133, on no statins PTA, now on no statin, goal LDL < 100   Hospital day # 2  TREATMENT/PLAN  Continue aspirin 81 mg orally every day for secondary stroke prevention.  Add statin (done)  OP sleep study to eval for obstructive sleep apnea ( placed an OP order)  No further stroke workup indicated. Patient has a  10-15% risk of having another stroke over the next year, the highest risk is within 2 weeks of the most recent stroke/TIA (risk of having a stroke following a stroke or TIA is the same). Ongoing risk factor control by Primary Care Physician Stroke Service will sign off. Please call should any needs arise. Follow up with Dr. Leonie Man, [facility], in 2 months.   SIGNED Burnetta Sabin, MSN, RN, ANVP-BC, ANP-BC, GNP-BC [facility] Pager: [phone] 08/24/2013 9:34 AM   I have personally obtained a history, examined the patient, evaluated imaging results, and formulated  the assessment and plan of care. I agree with the above. Antony Contras, MD  To contact Stroke Continuity provider, please refer to [email or web address] After hours, contact General Neurology

## 2013-08-30 ENCOUNTER — Encounter: Payer: Self-pay | Admitting: Family Medicine

## 2013-08-30 ENCOUNTER — Ambulatory Visit (INDEPENDENT_AMBULATORY_CARE_PROVIDER_SITE_OTHER): Payer: BC Managed Care – PPO | Admitting: Family Medicine

## 2013-08-30 VITALS — BP 118/67 | HR 77 | Temp 97.9°F | Resp 18 | Ht 66.0 in | Wt 168.0 lb

## 2013-08-30 DIAGNOSIS — F410 Panic disorder [episodic paroxysmal anxiety] without agoraphobia: Secondary | ICD-10-CM

## 2013-08-30 DIAGNOSIS — R002 Palpitations: Secondary | ICD-10-CM

## 2013-08-30 MED ORDER — ALPRAZOLAM 0.25 MG PO TABS: 0.2500 mg | ORAL_TABLET | Freq: Two times a day (BID) | ORAL | Status: DC | PRN

## 2013-08-30 NOTE — Progress Notes (Signed)
Subjective: Patient was in the hospital last week for an episode of visual blurring followed by some amnesia. They did not find a clear-cut cause of this on his stroke workup. He had a little arthrosclerosis of a branch artery. There was a small spot which apparently they felt like did not represent a stroke. It could have been a migraine. He was amnesic only to his wife's name and do a couple of other names.  He has been under some stress. Today he had 3 episodes that lasted about an hour to an hour and a half of feeling like his heart was racing and he did not feel right. He was scared he was having some kind of an event. He thinks that worrying about what took place last week has him worried about things more.  Objective: Alert and oriented. TMs normal. Eyes PERRLA. EOMs intact. Fundi benign. Throat clear. Neck supple without nodes thyromegaly. Chest clear. Heart regular without murmurs. Cranial nerves normal Finger to nose normal. Romberg negative. No palmar drift. Motor grossly normal.  Assessment: Possible panic attack Recent complex migraine versus TIA  Plan: EKG  EKG was normal  Will treat with some alprazolam and see how he does. Advise getting his cholesterol rechecked on May 12 percent late this summer.

## 2013-08-30 NOTE — Patient Instructions (Signed)
Take the alprazolam 0.25 mg only on an as-needed basis if you feel one of these episodes coming on.  If you have any episodes like he had last week colon to the emergency room directly.  If you keep having the palpitations sensations come back in here and we will get you set up to see a heart specialist  Return in about late August or early September to get her cholesterol checked. Try to come in one morning before he if had anything to eat to make for the most accurate test.

## 2013-09-01 ENCOUNTER — Telehealth: Payer: Self-pay | Admitting: Internal Medicine

## 2013-09-01 NOTE — Telephone Encounter (Signed)
Office notified via fax that this patient was a no show of appointment 05/08/13 and cancelled appointment 07/22/13

## 2013-10-20 ENCOUNTER — Encounter: Payer: Self-pay | Admitting: Internal Medicine

## 2013-10-20 ENCOUNTER — Ambulatory Visit (INDEPENDENT_AMBULATORY_CARE_PROVIDER_SITE_OTHER): Payer: BC Managed Care – PPO | Admitting: Internal Medicine

## 2013-10-20 VITALS — BP 116/74 | HR 75 | Ht 67.0 in | Wt 168.0 lb

## 2013-10-20 DIAGNOSIS — R002 Palpitations: Secondary | ICD-10-CM

## 2013-10-20 DIAGNOSIS — F411 Generalized anxiety disorder: Secondary | ICD-10-CM

## 2013-10-20 DIAGNOSIS — G459 Transient cerebral ischemic attack, unspecified: Secondary | ICD-10-CM

## 2013-10-20 DIAGNOSIS — F418 Other specified anxiety disorders: Secondary | ICD-10-CM | POA: Insufficient documentation

## 2013-10-20 NOTE — Progress Notes (Signed)
OFFICE NOTE  Chief Complaint:  Palpitations  Primary Care Physician: Leandrew Koyanagi, MD  HPI:  Todd [name] is a 53 year old male who was previously seen at [facility] by Dr. Elisabeth Cara. He underwent a stress test, echocardiogram and monitor which were unrevealing. He was told he was well and did not need to follow back up with [place]. He recently had an episode where he had some visual abnormalities including seeing white spots. He presented to the emergency room and there was concern for TIA or stroke. Workup was generally unremarkable except for some cerebral atherosclerosis. It was also considered that he might have had a migraine although he had no headache and has no history of headaches. He currently denies any chest pain although does get some mild shortness of breath with exertion. 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 He does have a family history of heart disease in his father who apparently had an enlarged heart and fluid around the heart. He also has a history of anxiety and was briefly a medication for anxiety and depression in the past. He takes Lipitor for elevated LDL cholesterol recently which was 130.  PMHx:  History reviewed. No pertinent past medical history.  Past Surgical History  Procedure Laterality Date  . Laparoscopic appendectomy  12/02/2011    Procedure: APPENDECTOMY LAPAROSCOPIC;  Surgeon: Madilyn Hook, DO;  Location: [facility];  Service: General;  Laterality: N/A;  . Appendectomy  12/01/11    lap appy    FAMHx:  Family History  Problem Relation Age of Onset  . Hypertension Mother   . Hypertension Father     SOCHx:   reports that he has never smoked. He has never used smokeless tobacco. He reports that he does not drink alcohol or use illicit drugs.  ALLERGIES:  Allergies  Allergen Reactions  . Wellbutrin [Bupropion Hcl] Itching    ROS: A comprehensive review of  systems was negative except for: Cardiovascular: positive for dyspnea and palpitations  HOME MEDS: Current Outpatient Prescriptions  Medication Sig Dispense Refill  . ALPRAZolam (XANAX) 0.25 MG tablet Take 1 tablet (0.25 mg total) by mouth 2 (two) times daily as needed for anxiety.  25 tablet  0  . aspirin 81 MG EC tablet Take 81 mg by mouth. Takes on occasion      . atorvastatin (LIPITOR) 10 MG tablet Take 10 mg by mouth. Takes on occasion       No current facility-administered medications for this visit.    LABS/IMAGING: No results found for this or any previous visit (from the past 48 hour(s)). No results found.  VITALS: BP 116/74  Pulse 75  Ht 5\' 7"  (1.702 m)  Wt 168 lb (76.204 kg)  BMI 26.31 kg/m2  EXAM: General appearance: alert and no distress Neck: no carotid bruit and no JVD Lungs: clear to auscultation bilaterally Heart: regular rate and rhythm, S1, S2 normal, no murmur, click, rub or gallop Abdomen: soft, non-tender; bowel sounds normal; no masses,  no organomegaly Extremities: extremities normal, atraumatic, no cyanosis or edema Pulses: 2+ and symmetric Skin: Skin color, texture, turgor normal. No rashes or lesions Neurologic: Grossly normal Psych: Mildly anxious  EKG: Normal sinus rhythm with sinus arrhythmia at 75  ASSESSMENT: 1. Palpitations 2. Mild dyspnea with exertion 3. Possible recent TIA 4. Atherosclerosis/dyslipidemia 5. Anxiety  PLAN: 1.   Todd [name] underwent a thorough workup of cardiovascular function with a  stress test, echocardiogram and monitor 3 years ago. All of which were negative. He recently had an episode that appears to be a TIA. He's also been describing symptoms that are concerning for palpitations. Carotid Dopplers were essentially negative with very mild bilateral atherosclerotic disease. His echocardiogram shows normal systolic function without any wall motion abnormalities. I would recommend a monitor to try to pick up on his  palpitations. I originally offered 2 days based on the frequency of his symptoms which are daily. He then suggested one week and I am agreeable to that.  Plan to see him back to discuss results of his monitor her and we may or may not consider empiric treatment with medication if he is interested in taking it.  Pixie Casino, MD, [facility] Attending Cardiologist [facility]  HILTY,Kenneth C 10/20/2013, 4:56 PM

## 2013-10-20 NOTE — Patient Instructions (Signed)
Your physician has recommended that you wear an event monitor. Event monitors are medical devices that record the heart's electrical activity. Doctors most often [place] these monitors to diagnose arrhythmias. Arrhythmias are problems with the speed or rhythm of the heartbeat. The monitor is a small, portable device. You can wear one while you do your normal daily activities. This is usually used to diagnose what is causing palpitations/syncope (passing out). ** you will wear this for 7 days.   Your physician recommends that you schedule a follow-up appointment after your monitor.     Cardiac Event Monitoring A cardiac event monitor is a small recording device used to help detect abnormal heart rhythms (arrhythmias). The monitor is used to record heart rhythm when noticeable symptoms such as the following occur:  Fast heartbeats (palpitations), such as heart racing or fluttering.  Dizziness.  Fainting or light-headedness.  Unexplained weakness. The monitor is wired to two electrodes placed on your chest. Electrodes are flat, sticky disks that attach to your skin. The monitor can be worn for up to 30 days. You will wear the monitor at all times, except when bathing.  HOW TO USE YOUR CARDIAC EVENT MONITOR A technician will prepare your chest for the electrode placement. The technician will show you how to place the electrodes, how to work the monitor, and how to replace the batteries. Take time to practice using the monitor before you leave the office. Make sure you understand how to send the information from the monitor to your health care provider. This requires a telephone with a landline, not a cell phone. You need to:  Wear your monitor at all times, except when you are in water:  Do not get the monitor wet.  Take the monitor off when bathing. Do not swim or use a hot tub with it on.  Keep your skin clean. Do not put body lotion or moisturizer on your chest.  Change the electrodes daily or  any time they stop sticking to your skin. You might need to use tape to keep them on.  It is possible that your skin under the electrodes could become irritated. To keep this from happening, try to put the electrodes in slightly different places on your chest. However, they must remain in the area under your left breast and in the upper right section of your chest.  Make sure the monitor is safely clipped to your clothing or in a location close to your body that your health care provider recommends.  Press the button to record when you feel symptoms of heart trouble, such as dizziness, weakness, light-headedness, palpitations, thumping, shortness of breath, unexplained weakness, or a fluttering or racing heart. The monitor is always on and records what happened slightly before you pressed the button, so do not worry about being too late to get good information.  Keep a diary of your activities, such as walking, doing chores, and taking medicine. It is especially important to note what you were doing when you pushed the button to record your symptoms. This will help your health care provider determine what might be contributing to your symptoms. The information stored in your monitor will be reviewed by your health care provider alongside your diary entries.  Send the recorded information as recommended by your health care provider. It is important to understand that it will take some time for your health care provider to process the results.  Change the batteries as recommended by your health care provider. [place]  CARE IF:   You have chest pain.  You have extreme difficulty breathing or shortness of breath.  You develop a very fast heartbeat that persists.  You develop dizziness that does not go away.  You faint or constantly feel you are about to faint. Document Released: 12/18/2007 Document Revised: 07/25/2013 Document Reviewed: 09/06/2012 [facility] Patient Information  2015 [place], [place]. This information is not intended to replace advice given to you by your health care provider. Make sure you discuss any questions you have with your health care provider.

## 2013-11-09 ENCOUNTER — Ambulatory Visit: Payer: BC Managed Care – PPO | Admitting: Internal Medicine

## 2013-11-17 ENCOUNTER — Encounter (HOSPITAL_COMMUNITY): Payer: Self-pay | Admitting: Emergency Medicine

## 2013-11-17 ENCOUNTER — Emergency Department (HOSPITAL_COMMUNITY)
Admission: EM | Admit: 2013-11-17 | Discharge: 2013-11-17 | Disposition: A | Discharge status: Home / Self Care | Payer: BC Managed Care – PPO | Attending: Emergency Medicine | Admitting: Emergency Medicine

## 2013-11-17 ENCOUNTER — Emergency Department (HOSPITAL_COMMUNITY): Payer: BC Managed Care – PPO

## 2013-11-17 DIAGNOSIS — Z7982 Long term (current) use of aspirin: Secondary | ICD-10-CM | POA: Diagnosis not present

## 2013-11-17 DIAGNOSIS — Z8673 Personal history of transient ischemic attack (TIA), and cerebral infarction without residual deficits: Secondary | ICD-10-CM | POA: Diagnosis not present

## 2013-11-17 DIAGNOSIS — G43109 Migraine with aura, not intractable, without status migrainosus: Secondary | ICD-10-CM | POA: Diagnosis not present

## 2013-11-17 DIAGNOSIS — Z79899 Other long term (current) drug therapy: Secondary | ICD-10-CM | POA: Diagnosis not present

## 2013-11-17 DIAGNOSIS — R209 Unspecified disturbances of skin sensation: Secondary | ICD-10-CM | POA: Diagnosis present

## 2013-11-17 HISTORY — DX: Transient cerebral ischemic attack, unspecified: G45.9

## 2013-11-17 LAB — I-STAT CHEM 8, ED
BUN: 19 mg/dL (ref 6–23)
Calcium, Ion: 1.17 mmol/L (ref 1.12–1.23)
Chloride: 107 mEq/L (ref 96–112)
Creatinine, Ser: 0.9 mg/dL (ref 0.50–1.35)
Glucose, Bld: 111 mg/dL — ABNORMAL HIGH (ref 70–99)
HCT: 41 % (ref 39.0–52.0)
Hemoglobin: 13.9 g/dL (ref 13.0–17.0)
Potassium: 3.9 mEq/L (ref 3.7–5.3)
Sodium: 138 mEq/L (ref 137–147)
TCO2: 22 mmol/L (ref 0–100)

## 2013-11-17 LAB — CBG MONITORING, ED: Glucose-Capillary: 110 mg/dL — ABNORMAL HIGH (ref 70–99)

## 2013-11-17 MED ORDER — IBUPROFEN 800 MG PO TABS
800.0000 mg | ORAL_TABLET | Freq: Once | ORAL | Status: AC
  Administered 2013-11-17: 800 mg via ORAL
  Filled 2013-11-17: qty 1

## 2013-11-17 MED ORDER — NAPROXEN 500 MG PO TABS: 500.0000 mg | ORAL_TABLET | Freq: Two times a day (BID) | ORAL | Status: DC

## 2013-11-17 NOTE — ED Notes (Signed)
Dr Miller at bedside. 

## 2013-11-17 NOTE — ED Notes (Addendum)
Pt c/o numbness in L fingertips and headache in L side of head x 4 hours ago.  Pt reports laying down to take a nap and woke up feeling numbness to L side of head. Reports sensation to L side of head feels less than right.  Hx of TIA 3 weeks ago. Reports taking aspirin prior to arrival.  At time of assessment, reports numbness in L fingertips is gone.  Denies blurred or double vision.

## 2013-11-17 NOTE — Discharge Instructions (Signed)
Please call your doctor for a followup appointment within 24-48 hours. When you talk to your doctor please let them know that you were seen in the emergency department and have them acquire all of your records so that they can discuss the findings with you and formulate a treatment plan to fully care for your new and ongoing problems. ° °

## 2013-11-17 NOTE — ED Provider Notes (Signed)
CSN: [id number]     Arrival date & time 11/17/13  0146 History   First MD Initiated Contact with Patient 11/17/13 [phone]     Chief Complaint  Patient presents with  . Numbness     (Consider location/radiation/quality/duration/timing/severity/associated sxs/prior Treatment) HPI Comments: The patient is a 53 year old male who presents with a complaint of a left-sided headache, left scalp numbness and left finger numbness. Onset of the symptoms was 4 hours ago, this was acute in onset, persistent, gradually improved in the numbness has now resolved. The patient states that he still has a very small amount of left scalp numbness and headache but his fingertips are no longer numb. He denies any difficulty speaking, no visual changes, no problems with gait or balance or coordination. He has taken no medications prior to arrival other than an aspirin which he states he does not take regularly despite a recent admission for what was likely a transient ischemic attack. He also does not take his Lipitor stating that he was not worried about his cholesterol being slightly elevated. The patient denies fevers chills nausea vomiting chest pain shortness of breath abdominal pain back pain or neck pain.  The history is provided by the patient and medical records.    Past Medical History  Diagnosis Date  . TIA (transient ischemic attack)    Past Surgical History  Procedure Laterality Date  . Laparoscopic appendectomy  12/02/2011    Procedure: APPENDECTOMY LAPAROSCOPIC;  Surgeon: Madilyn Hook, DO;  Location: [facility];  Service: General;  Laterality: N/A;  . Appendectomy  12/01/11    lap appy   Family History  Problem Relation Age of Onset  . Hypertension Mother   . Hypertension Father    History  Substance Use Topics  . Smoking status: Never Smoker   . Smokeless tobacco: Never Used  . Alcohol Use: No    Review of Systems  All other systems reviewed and are negative.     Allergies   Wellbutrin  Home Medications   Prior to Admission medications   Medication Sig Start Date End Date Taking? Authorizing Provider  aspirin 81 MG EC tablet Take 81 mg by mouth daily.  08/24/13  Yes Kelvin Cellar, MD  atorvastatin (LIPITOR) 10 MG tablet Take 10 mg by mouth daily.  08/24/13  Yes Kelvin Cellar, MD  naproxen (NAPROSYN) 500 MG tablet Take 1 tablet (500 mg total) by mouth 2 (two) times daily with a meal. 11/17/13   Johnna Acosta, MD   BP 117/80  Pulse 58  Temp(Src) 97.7 F (36.5 C) (Oral)  Resp 14  Ht 5\' 6"  (1.676 m)  Wt 165 lb (74.844 kg)  BMI 26.64 kg/m2  SpO2 98% Physical Exam  Nursing note and vitals reviewed. Constitutional: He appears well-developed and well-nourished. No distress.  HENT:  Head: Normocephalic and atraumatic.  Mouth/Throat: Oropharynx is clear and moist. No oropharyngeal exudate.  Eyes: Conjunctivae and EOM are normal. Pupils are equal, round, and reactive to light. Right eye exhibits no discharge. Left eye exhibits no discharge. No scleral icterus.  Neck: Normal range of motion. Neck supple. No JVD present. No thyromegaly present.  Cardiovascular: Normal rate, regular rhythm, normal heart sounds and intact distal pulses.  Exam reveals no gallop and no friction rub.   No murmur heard. Pulmonary/Chest: Effort normal and breath sounds normal. No respiratory distress. He has no wheezes. He has no rales.  Abdominal: Soft. Bowel sounds are normal. He exhibits no distension and no mass. There is no  tenderness.  Musculoskeletal: Normal range of motion. He exhibits no edema and no tenderness.  Lymphadenopathy:    He has no cervical adenopathy.  Neurological: He is alert. Coordination normal.  Speech is clear, cranial nerves III through XII are intact, memory is intact, strength is normal in all 4 extremities including grips, sensation is intact to light touch and pinprick in all 4 extremities. Coordination as tested by finger-nose-finger is normal, no limb  ataxia. Normal gait, normal reflexes at the patellar tendons bilaterally  The only abnormality on neurologic exam is a slight sensory deficit to the scalp at the crown of the head  Skin: Skin is warm and dry. No rash noted. No erythema.  Psychiatric: He has a normal mood and affect. His behavior is normal.    ED Course  Procedures (including critical care time) Labs Review Labs Reviewed  CBG MONITORING, ED - Abnormal; Notable for the following:    Glucose-Capillary 110 (*)    All other components within normal limits  I-STAT CHEM 8, ED - Abnormal; Notable for the following:    Glucose, Bld 111 (*)    All other components within normal limits    Imaging Review No results found.    MDM   Final diagnoses:  Complicated migraine    Discussed care with Dr. Leonel Ramsay of the neurology service who agrees that the patient is likely having a complicated migraine, no indication for further imaging given that the patient had a recent echocardiogram, carotid Doppler ultrasound and MRI which were questionable that there was any ischemic findings at all. He does recommend ongoing aspirin use.   The patient has had improved symptoms, ibuprofen given, the patient instructed on indications for return, stable for discharge   Meds given in ED:  Medications  ibuprofen (ADVIL,MOTRIN) tablet 800 mg (800 mg Oral Given 11/17/13 0255)    New Prescriptions   NAPROXEN (NAPROSYN) 500 MG TABLET    Take 1 tablet (500 mg total) by mouth 2 (two) times daily with a meal.       Johnna Acosta, MD 11/17/13 [id number]

## 2013-11-17 NOTE — ED Notes (Signed)
Pt reports numbness to L side of head and L fingers x 3-4 hours. Hx TIA 3 months ago. Denies weakness, blurred vision, problems with speech.

## 2013-12-19 ENCOUNTER — Ambulatory Visit: Payer: BC Managed Care – PPO | Admitting: Internal Medicine

## 2013-12-21 ENCOUNTER — Encounter: Payer: Self-pay | Admitting: Internal Medicine

## 2014-03-26 ENCOUNTER — Ambulatory Visit (INDEPENDENT_AMBULATORY_CARE_PROVIDER_SITE_OTHER): Payer: Self-pay | Admitting: Physician Assistant

## 2014-03-26 VITALS — BP 108/82 | HR 81 | Temp 98.4°F | Resp 16 | Ht 67.0 in | Wt 161.0 lb

## 2014-03-26 DIAGNOSIS — J029 Acute pharyngitis, unspecified: Secondary | ICD-10-CM

## 2014-03-26 DIAGNOSIS — J069 Acute upper respiratory infection, unspecified: Secondary | ICD-10-CM

## 2014-03-26 LAB — POCT RAPID STREP A (OFFICE): Rapid Strep A Screen: NEGATIVE

## 2014-03-26 MED ORDER — GUAIFENESIN ER 1200 MG PO TB12: 1.0000 | ORAL_TABLET | Freq: Two times a day (BID) | ORAL | Status: AC

## 2014-03-26 MED ORDER — FIRST-MOUTHWASH BLM MT SUSP: OROMUCOSAL | Status: DC

## 2014-03-26 NOTE — Progress Notes (Signed)
Subjective:    Patient ID: Todd [name], male    DOB: 07/11/1960, 54 y.o.   MRN: [id number]  HPI Pt presents to clinic with sore throat that started yesterday and seems to have gotten worse - he is also having some congestion and PND but no cough.  He is worried that if he does not take care of this soon he will get worse.  He has been out of work for the last week due to the holidays and has not been around other ill people.   OTC meds - none Wife sick with similar symptoms  Review of Systems  Constitutional: Negative for fever and chills.  HENT: Positive for congestion, postnasal drip, rhinorrhea (yellow) and sore throat.   Respiratory: Negative for cough.   Musculoskeletal: Negative for myalgias.  Neurological: Negative for headaches.   Patient Active Problem List   Diagnosis Date Noted  . Palpitations 10/20/2013  . Situational anxiety 10/20/2013  . Migraine with status migrainosus 08/24/2013  . TIA (transient ischemic attack) 08/23/2013  . Blurred vision 08/23/2013  . Anomia 08/23/2013   Prior to Admission medications   Not on File   Allergies  Allergen Reactions  . Wellbutrin [Bupropion Hcl] Itching    Medications, allergies, past medical history, surgical history, family history, social history and problem list reviewed and updated.      Objective:   Physical Exam  Constitutional: He is oriented to person, place, and time. He appears well-developed and well-nourished.  BP 108/82 mmHg  Pulse 81  Temp(Src) 98.4 F (36.9 C)  Resp 16  Ht 5\' 7"  (1.702 m)  Wt 161 lb (73.029 kg)  BMI 25.21 kg/m2  SpO2 97%   HENT:  Head: Normocephalic and atraumatic.  Right Ear: Hearing, tympanic membrane, external ear and ear canal normal.  Left Ear: Hearing, tympanic membrane, external ear and ear canal normal.  Nose: Mucosal edema (red and swollen) present.  Mouth/Throat: Uvula is midline and oropharynx is clear and moist.  Winces when he swallows.  Eyes: Conjunctivae are  normal.  Neck: Normal range of motion.  Cardiovascular: Normal rate, regular rhythm and normal heart sounds.   No murmur heard. Pulmonary/Chest: Effort normal and breath sounds normal. He has no wheezes.  Lymphadenopathy:       Head (right side): No tonsillar and no occipital adenopathy present.       Head (left side): No tonsillar and no occipital adenopathy present.    He has cervical adenopathy.       Right cervical: Superficial cervical adenopathy present.       Left cervical: No superficial cervical adenopathy present.       Right: No supraclavicular adenopathy present.       Left: No supraclavicular adenopathy present.  Neurological: He is alert and oriented to person, place, and time.  Skin: Skin is warm and dry.  Psychiatric: He has a normal mood and affect. His behavior is normal. Judgment and thought content normal.    Results for orders placed or performed in visit on 03/26/14  POCT rapid strep A  Result Value Ref Range   Rapid Strep A Screen Negative Negative       Assessment & Plan:  Sore throat - Plan: POCT rapid strep A, DPH-Lido-AlHydr-MgHydr-Simeth (FIRST-MOUTHWASH BLM) SUSP  URI (upper respiratory infection) - Plan: Guaifenesin (MUCINEX MAXIMUM STRENGTH) 1200 MG TB12   Symptomatic treatment discussed with patient.  Windell Hummingbird PA-C  [facility] 03/26/2014 9:30 AM

## 2014-03-26 NOTE — Patient Instructions (Signed)
Ibuprofen (motrin or advil are the same) - please take 2 pills every 4 hours as you need to feel better  Drink a lot of fluids and rest

## 2014-06-17 ENCOUNTER — Ambulatory Visit (INDEPENDENT_AMBULATORY_CARE_PROVIDER_SITE_OTHER): Payer: BLUE CROSS/BLUE SHIELD | Admitting: Internal Medicine

## 2014-06-17 VITALS — BP 120/80 | HR 72 | Temp 97.6°F | Resp 16 | Ht 66.0 in | Wt 164.0 lb

## 2014-06-17 DIAGNOSIS — Z1211 Encounter for screening for malignant neoplasm of colon: Secondary | ICD-10-CM | POA: Diagnosis not present

## 2014-06-17 DIAGNOSIS — M25561 Pain in right knee: Secondary | ICD-10-CM | POA: Diagnosis not present

## 2014-06-17 DIAGNOSIS — Z125 Encounter for screening for malignant neoplasm of prostate: Secondary | ICD-10-CM

## 2014-06-17 DIAGNOSIS — M25562 Pain in left knee: Secondary | ICD-10-CM

## 2014-06-17 DIAGNOSIS — K921 Melena: Secondary | ICD-10-CM

## 2014-06-17 DIAGNOSIS — R152 Fecal urgency: Secondary | ICD-10-CM

## 2014-06-17 DIAGNOSIS — K602 Anal fissure, unspecified: Secondary | ICD-10-CM

## 2014-06-17 LAB — POCT CBC
Granulocyte percent: 60.1 %G (ref 37–80)
HCT, POC: 41.4 % — AB (ref 43.5–53.7)
Hemoglobin: 13.7 g/dL — AB (ref 14.1–18.1)
Lymph, poc: 2.3 (ref 0.6–3.4)
MCH, POC: 28.6 pg (ref 27–31.2)
MCHC: 33.1 g/dL (ref 31.8–35.4)
MCV: 86.4 fL (ref 80–97)
MID (cbc): 0.4 (ref 0–0.9)
MPV: 7.2 fL (ref 0–99.8)
POC Granulocyte: 4 (ref 2–6.9)
POC LYMPH PERCENT: 34.3 %L (ref 10–50)
POC MID %: 5.6 %M (ref 0–12)
Platelet Count, POC: 285 10*3/uL (ref 142–424)
RBC: 4.79 M/uL (ref 4.69–6.13)
RDW, POC: 13.6 %
WBC: 6.7 10*3/uL (ref 4.6–10.2)

## 2014-06-17 LAB — POCT SEDIMENTATION RATE: POCT SED RATE: 26 mm/hr — AB (ref 0–22)

## 2014-06-17 LAB — IFOBT (OCCULT BLOOD): IFOBT: NEGATIVE

## 2014-06-17 MED ORDER — MELOXICAM 15 MG PO TABS: 15.0000 mg | ORAL_TABLET | Freq: Every day | ORAL | Status: DC

## 2014-06-17 NOTE — Progress Notes (Signed)
Subjective:   This chart was scribed for Todd Lin, MD by Erling Conte, Medical Scribe. This patient was seen in Room 11 and the patient's care was started at 9:06 AM.   Patient ID: Todd [name], male    DOB: 1960-09-30, 54 y.o.   MRN: [id number]  Chief Complaint  Patient presents with  . Rectal Bleeding    ongoing issue   . Knee Pain    bilateral x 7 months    HPI HPI Comments: Todd [name] is a 54 y.o. male who presents to the [facility] complaining of intermittent rectal bleeding for 10-15 years. Pt states it has been gradually getting worse and yesterday it was bleeding and he lost about 10-20 oz of blood. He admits that he has h/o of hemorrhoids in the past. He has a colonoscopy about 10 years ago and told everything was okay. He states he hemorrhoids are not internal. He is having associated intermittent diarrhea and urgency of stool. He notes he has occasional heart burn and indigestion. He denies any weight loss or abdominal pain.   He has a second complaint of intermittent, moderate, bilateral knee pain for 7 months. Pt notes that the pain is worse upon waking up in the morning. He reports he has to limp for about 15-20 minutes and then the pain subsides. He does not bend a lot at work but states sometimes he has to climb up ladders at work. He does not that he is always on his feet at work. He denies any swelling or injury to the area.   Patient Active Problem List   Diagnosis Date Noted  . Palpitations 10/20/2013  . Situational anxiety 10/20/2013  . Migraine with status migrainosus 08/24/2013  . TIA (transient ischemic attack) 08/23/2013  . Blurred vision 08/23/2013  . Anomia 08/23/2013   Past Medical History  Diagnosis Date  . TIA (transient ischemic attack)    Past Surgical History  Procedure Laterality Date  . Laparoscopic appendectomy  12/02/2011    Procedure: APPENDECTOMY LAPAROSCOPIC;  Surgeon: Madilyn Hook, DO;  Location: [facility];   Service: General;  Laterality: N/A;  . Appendectomy  12/01/11    lap appy   Allergies  Allergen Reactions  . Wellbutrin [Bupropion Hcl] Itching   Prior to Admission medications   Not on File   History   Social History  . Marital Status: Married    Spouse Name: N/A  . Number of Children: N/A  . Years of Education: N/A   Occupational History  . Not on file.   Social History Main Topics  . Smoking status: Never Smoker   . Smokeless tobacco: Never Used  . Alcohol Use: No  . Drug Use: No  . Sexual Activity: No   Other Topics Concern  . Not on file   Social History Narrative   Review of Systems  Constitutional: Negative for diaphoresis, activity change, appetite change, fatigue and unexpected weight change.  Eyes: Negative for visual disturbance.  Respiratory: Negative for cough and shortness of breath.   Cardiovascular: Negative for chest pain, palpitations and leg swelling.  Gastrointestinal: Negative for abdominal pain.  Genitourinary: Negative for difficulty urinating.  Neurological: Negative for headaches.  Psychiatric/Behavioral: Negative for sleep disturbance.       Objective:   Physical Exam  Constitutional: He is oriented to person, place, and time. He appears well-developed and well-nourished. No distress.  HENT:  Head: Normocephalic and atraumatic.  Eyes: Conjunctivae and EOM are normal.  Neck:  Neck supple.  Cardiovascular: Normal rate, regular rhythm and normal heart sounds.   Pulmonary/Chest: Effort normal and breath sounds normal. No respiratory distress.  Abdominal: Soft. There is no tenderness.  Genitourinary: Prostate normal.  Healed hemorrhoidal tags externally with an anal fissure at 5:00. Digital exam denotes no masses Prostate soft and symmetrical without nodules  Musculoskeletal: Normal range of motion.       Right knee: Tenderness found. Medial joint line tenderness noted.       Left knee: Tenderness found. Lateral joint line tenderness  noted.  No popliteal cyst on either knee  Neurological: He is alert and oriented to person, place, and time.  Skin: Skin is warm and dry.  Psychiatric: He has a normal mood and affect. His behavior is normal.  Nursing note and vitals reviewed.   Filed Vitals:   06/17/14 0824  BP: 120/80  Pulse: 72  Temp: 97.6 F (36.4 C)  TempSrc: Oral  Resp: 16  Height: 5\' 6"  (1.676 m)  Weight: 164 lb (74.39 kg)  SpO2: 98%   Results for orders placed or performed in visit on 06/17/14  POCT CBC  Result Value Ref Range   WBC 6.7 4.6 - 10.2 K/uL   Lymph, poc 2.3 0.6 - 3.4   POC LYMPH PERCENT 34.3 10 - 50 %L   MID (cbc) 0.4 0 - 0.9   POC MID % 5.6 0 - 12 %M   POC Granulocyte 4.0 2 - 6.9   Granulocyte percent 60.1 37 - 80 %G   RBC 4.79 4.69 - 6.13 M/uL   Hemoglobin 13.7 (A) 14.1 - 18.1 g/dL   HCT, POC 41.4 (A) 43.5 - 53.7 %   MCV 86.4 80 - 97 fL   MCH, POC 28.6 27 - 31.2 pg   MCHC 33.1 31.8 - 35.4 g/dL   RDW, POC 13.6 %   Platelet Count, POC 285 142 - 424 K/uL   MPV 7.2 0 - 99.8 fL  POCT SEDIMENTATION RATE  Result Value Ref Range   POCT SED RATE 26 (A) 0 - 22 mm/hr  IFOBT POC (occult bld, rslt in office)  Result Value Ref Range   IFOBT Negative        Assessment & Plan:  Hematochezia - Plan: SEDIMENTATION RATE, Ambulatory referral to Gastroenterology  Anal fissure - Plan: , Ambulatory referral to Gastroenterology  Defecation urgency - Plan: POCT SEDIMENTATION RATE,Ambulatory referral to Gastroenterology  Knee pain, bilateral--- tendinitis likely//meloxicam plus exercises for one month and follow-up if not well  Special screening for malignant neoplasms, colon  This would be a follow-up colonoscopy  Screening for prostate cancer - Plan: PSA #1  Meds ordered this encounter  Medications  . meloxicam (MOBIC) 15 MG tablet    Sig: Take 1 tablet (15 mg total) by mouth daily. For knee pain    Dispense:  30 tablet    Refill:  0   I have completed the patient encounter in its  entirety as documented by the scribe, with editing by me where necessary. Cameran Ahmed P. Laney Pastor, M.D.     I

## 2014-06-19 LAB — PSA: PSA: 1 ng/mL (ref <=4.00)

## 2014-06-20 ENCOUNTER — Encounter: Payer: Self-pay | Admitting: Internal Medicine

## 2014-07-12 ENCOUNTER — Telehealth: Payer: Self-pay | Admitting: Cardiology

## 2014-07-12 ENCOUNTER — Telehealth: Payer: Self-pay | Admitting: Internal Medicine

## 2014-07-12 ENCOUNTER — Ambulatory Visit (INDEPENDENT_AMBULATORY_CARE_PROVIDER_SITE_OTHER): Payer: BLUE CROSS/BLUE SHIELD | Admitting: Physician Assistant

## 2014-07-12 ENCOUNTER — Encounter: Payer: Self-pay | Admitting: Physician Assistant

## 2014-07-12 VITALS — BP 110/78 | HR 77 | Ht 66.0 in | Wt 165.0 lb

## 2014-07-12 DIAGNOSIS — R002 Palpitations: Secondary | ICD-10-CM | POA: Diagnosis not present

## 2014-07-12 DIAGNOSIS — R0683 Snoring: Secondary | ICD-10-CM | POA: Diagnosis not present

## 2014-07-12 DIAGNOSIS — R079 Chest pain, unspecified: Secondary | ICD-10-CM | POA: Diagnosis not present

## 2014-07-12 LAB — TROPONIN I: Troponin I: <0.01 ng/mL (ref <0.06)

## 2014-07-12 NOTE — Telephone Encounter (Signed)
Leann called in statin that the pt is currently in the office with "needle-like chest pain" and would like to be seen asap  Thanks

## 2014-07-12 NOTE — Telephone Encounter (Signed)
Received stat lab call regarding troponin which was normal. I called patient to let him know. He verbalized understanding and gratitude. Plan is to proceed with ETT-nuc per Richardson Dopp PA-C's note. Will forward to him for his information. Dayna Dunn PA-C

## 2014-07-12 NOTE — Telephone Encounter (Signed)
Spoke with Marylin Crosby, PA at [facility] - PCP. Patient c/o chest pain and has a longstanding h/o palpitations. And EKG was performed and read as unremarkable but Marylin Crosby, PA would prefer patient be evaluated by cardiology, preferably today. Patient will be seen today by Kathleen Argue, PA at 3pm at [facility]. Marylin Crosby, PA will inform patient and send EKG. PCP office is on [facility] and records are accessible in care everywhere

## 2014-07-12 NOTE — Progress Notes (Signed)
Cardiology Office Note   Date:  07/12/2014   ID:  Todd [name], DOB [birth year], MRN [id number]  PCP:  Leandrew Koyanagi, MD  Cardiologist:  Dr. K. Mali Hilty     Chief Complaint  Patient presents with  . Chest Pain     History of Present Illness: Todd [name] is a 54 y.o. male who was evaluated by Dr. Raliegh Ip. Mali Hilty last year after an admission to the hospital with symptoms that represented a TIA vs complex migraine.  Carotid US demonstrated no significant ICA stenosis and Echo demonstrated normal LVF.  He complained of palpitations when he saw Dr. Debara Pickett.  Event monitor was arranged.  There is one strip scanned in to his chart that demonstrates sinus tachy.    He was seen at his PCP's office today with chest pain.  He is added on for urgent evaluation.  He has had palpitations for over 6 years.  He owns a [employer].  He is quite busy and under a lot of stress.  He has had some hematochezia recently.  This was evaluated by his PCP.  He has an appointment with GI pending.  Hgb was ok.  Today he started to have sharp L lower chest pain.  This seemed to be assoc with his palpitations.  It went on for about 4 hours.  It is now resolved.  No pleuritic chest pain.  No recent trips.  No recent hospital stays.  He also notes chest pressure if leans forward.  He denies dysphagia or odynophagia.  He denies belching or dyspepsia.  He denies exertional chest pain.  He has dyspnea with more extreme activities.  He is NYHA 2.  He denies orthopnea, PND, edema.  No syncope. No near syncope.  He continues to have palpitations that are frequent.  They may occur 1 x a week or 3-4 times a day.  He denies heavy caffeine or stimulant use.  No cigs, ETOH, drugs.     Studies/Reports Reviewed Today:  Echo 08/23/13 - EF 60% to 65%. Wall motion was normal  - Atrial septum: No defect or patent foramen ovale was identified.  Carotid US 08/23/13 Bilateral ICA 1-39%  Myoview 10/2008 No ischemia, EF 62%; Low  Risk   Past Medical History  Diagnosis Date  . TIA (transient ischemic attack)   . Palpitations   . Hx of echocardiogram     echo 6/15:  EF 60-65%, no RWMA  . Hx of Doppler ultrasound     Carotid US 6/15:  bilat ICA 1-39%  . Hx of cardiovascular stress test     Myoview 8/10:  no ischemia, EF 62%; low risk    Past Surgical History  Procedure Laterality Date  . Laparoscopic appendectomy  12/02/2011    Procedure: APPENDECTOMY LAPAROSCOPIC;  Surgeon: Madilyn Hook, DO;  Location: [facility];  Service: General;  Laterality: N/A;  . Appendectomy  12/01/11    lap appy     Current Outpatient Prescriptions  Medication Sig Dispense Refill  . benzonatate (TESSALON) 100 MG capsule Take 100 mg by mouth 2 (two) times daily as needed.     . meloxicam (MOBIC) 15 MG tablet Take 1 tablet (15 mg total) by mouth daily. For knee pain 30 tablet 0   No current facility-administered medications for this visit.    Allergies:   Wellbutrin    Social History:  The patient  reports that he has never smoked. He has never used smokeless tobacco. He reports that he  does not drink alcohol or use illicit drugs.   Family History:  The patient's family history includes Hypertension in his father and mother. There is no history of Anemia, Arrhythmia, Asthma, Clotting disorder, Fainting, Heart attack, Heart disease, Heart failure, or Hyperlipidemia.    ROS:   Please see the history of present illness.   Review of Systems  HENT: Positive for headaches, hearing loss and sore throat.   Cardiovascular: Positive for chest pain, irregular heartbeat and orthopnea.  Respiratory: Positive for cough and snoring.   Musculoskeletal: Positive for back pain and joint pain.  Gastrointestinal: Positive for constipation and hematochezia.  Psychiatric/Behavioral: Positive for depression.  All other systems reviewed and are negative.    PHYSICAL EXAM: VS:  BP 110/78 mmHg  Pulse 77  Ht 5\' 6"  (1.676 m)  Wt 165 lb (74.844 kg)   BMI 26.64 kg/m2  SpO2 98%    Wt Readings from Last 3 Encounters:  07/12/14 165 lb (74.844 kg)  06/17/14 164 lb (74.39 kg)  03/26/14 161 lb (73.029 kg)     GEN: Well nourished, well developed, in no acute distress HEENT: normal Neck: no JVD, no carotid bruits, no masses Cardiac:  Normal S1/S2, RRR; no murmur ,  no rubs or gallops, no edema  Respiratory:  clear to auscultation bilaterally, no wheezing, rhonchi or rales. GI: soft, nontender, nondistended, + BS MS: no deformity or atrophy Skin: warm and dry  Neuro:  CNs II-XII intact, Strength and sensation are intact Psych: Normal affect   EKG:  EKG is ordered today.  It demonstrates:   NSR, HR 77, normal axis, no acute changes   Recent Labs: 08/22/2013: ALT 20 08/23/2013: Platelets 209 11/17/2013: BUN 19; Creatinine 0.90; Potassium 3.9; Sodium 138 06/17/2014: Hemoglobin 13.7*    Lipid Panel    Component Value Date/Time   CHOL 196 08/23/2013 0700   TRIG 105 08/23/2013 0700   HDL 42 08/23/2013 0700   CHOLHDL 4.7 08/23/2013 0700   VLDL 21 08/23/2013 0700   LDLCALC 133* 08/23/2013 0700      ASSESSMENT AND PLAN:  Chest pain, unspecified chest pain type Symptoms are somewhat atypical.  Wells Criteria are neg.  O2 is normal.  ECG is normal.  He is under a lot of stress.  Question if this is contributing to his symptoms. He denies symptoms that sound c/w GERD.  He is seeing GI soon.  He had prolonged symptoms today that were concerning for him.  -  Labs today:  Stat Troponin, BMET, TSH, Mg2+  -  Refer to the hospital for admission if Troponin abnormal.  -  If Troponin normal, proceed with ETT-Myoview.  -  Obtain CXR.   Palpitations  I suspect PVC/PACs.  He just wore a monitor 09/2013.  Will proceed with testing as noted above. Consider repeat Event Monitor in FU.  Consider evaluation with PCP for treatment of anxiety to see if this helps symptoms.  Snoring He has a hx of snoring. He does not have the typical body habitus of  OSA.  STOP-BANG=3 (male, age > 60, + snoring) >> intermediate risk.  I have asked him to have his wife note if his snoring is loud and if there is witnessed apnea.  We can consider sleep testing at FU.     Current medicines are reviewed at length with the patient today.  Concerns regarding medicines are as outlined above.  The following changes have been made:    None    Labs/ tests ordered today  include:  Orders Placed This Encounter  Procedures  . DG Chest 2 View  . Troponin I  . Basic Metabolic Panel (BMET)  . Magnesium  . TSH  . Myocardial Perfusion Imaging  . EKG 12-Lead    Disposition:   FU with Dr. Raliegh Ip. Mali Hilty 2-3 weeks.    Signed, Versie Starks, MHS 07/12/2014 4:14 PM    [facility] [address], [place], [place]  [address] Phone: [phone]; Fax: [phone]

## 2014-07-12 NOTE — Patient Instructions (Signed)
Medication Instructions:  Your physician recommends that you continue on your current medications as directed. Please refer to the Current Medication list given to you today.  Labwork: TODAY STAT TROPONIN, BMET, MAG LEVEL, TSH  Testing/Procedures: 1. Your physician has requested that you have en exercise stress myoview. For further information please visit [email or web address]. Please follow instruction sheet, as given.  2. A chest x-ray @ [facility] ([facility] )takes a picture of the organs and structures inside the chest, including the heart, lungs, and blood vessels. This test can show several things, including, whether the heart is enlarges; whether fluid is building up in the lungs; and whether pacemaker / defibrillator leads are still in place.  Follow-Up: FOLLOW UP WITH DR. HILTY IN 2-3 WEEKS AT THE [facility]  Any Other Special Instructions Will Be Listed Below (If Applicable).

## 2014-07-13 LAB — TSH: TSH: 1.58 u[IU]/mL (ref 0.35–4.50)

## 2014-07-13 LAB — BASIC METABOLIC PANEL
BUN: 15 mg/dL (ref 6–23)
CO2: 26 mEq/L (ref 19–32)
Calcium: 9 mg/dL (ref 8.4–10.5)
Chloride: 106 mEq/L (ref 96–112)
Creatinine, Ser: 1.01 mg/dL (ref 0.40–1.50)
GFR: 82.38 mL/min (ref >60.00)
Glucose, Bld: 114 mg/dL — ABNORMAL HIGH (ref 70–99)
Potassium: 4 mEq/L (ref 3.5–5.1)
Sodium: 139 mEq/L (ref 135–145)

## 2014-07-13 LAB — MAGNESIUM: Magnesium: 2.1 mg/dL (ref 1.5–2.5)

## 2014-07-13 NOTE — Telephone Encounter (Signed)
Thank you! Richardson Dopp, PA-C   07/13/2014 7:59 AM

## 2014-07-19 ENCOUNTER — Telehealth: Payer: Self-pay | Admitting: *Deleted

## 2014-07-19 NOTE — Telephone Encounter (Signed)
pt notified about lab results. Pt was advised per Brynda Rim. PA to f/u PCP in regards to glucose slightly elevated . Pt agreeable to plan of care and recommendations by phone.

## 2014-07-24 ENCOUNTER — Telehealth (HOSPITAL_COMMUNITY): Payer: Self-pay | Admitting: *Deleted

## 2014-07-24 NOTE — Telephone Encounter (Signed)
Patient given detailed instructions per Myocardial Perfusion Study Information Sheet for test on 07/24/14 at 1510. Patient verbalized understanding. Todd [name], Ranae Palms

## 2014-07-25 ENCOUNTER — Ambulatory Visit (HOSPITAL_COMMUNITY): Payer: BLUE CROSS/BLUE SHIELD | Attending: Physician Assistant

## 2014-07-25 ENCOUNTER — Ambulatory Visit (AMBULATORY_SURGERY_CENTER): Payer: Self-pay | Admitting: *Deleted

## 2014-07-25 VITALS — Ht 66.0 in | Wt 167.0 lb

## 2014-07-25 DIAGNOSIS — R002 Palpitations: Secondary | ICD-10-CM | POA: Insufficient documentation

## 2014-07-25 DIAGNOSIS — R079 Chest pain, unspecified: Secondary | ICD-10-CM | POA: Diagnosis not present

## 2014-07-25 DIAGNOSIS — Z1211 Encounter for screening for malignant neoplasm of colon: Secondary | ICD-10-CM

## 2014-07-25 MED ORDER — TECHNETIUM TC 99M SESTAMIBI GENERIC - CARDIOLITE
33.0000 | Freq: Once | INTRAVENOUS | Status: AC | PRN
  Administered 2014-07-25: 33 via INTRAVENOUS

## 2014-07-25 MED ORDER — TECHNETIUM TC 99M SESTAMIBI GENERIC - CARDIOLITE
11.0000 | Freq: Once | INTRAVENOUS | Status: AC | PRN
  Administered 2014-07-25: 11 via INTRAVENOUS

## 2014-07-25 NOTE — Progress Notes (Signed)
Denies allergies to eggs or soy products. Denies complications with sedation or anesthesia. Denies O2 use. Denies use of diet or weight loss medications.  Emmi instructions given for colonoscopy.  

## 2014-07-26 ENCOUNTER — Other Ambulatory Visit: Payer: Self-pay | Admitting: Nurse Practitioner

## 2014-07-26 ENCOUNTER — Telehealth: Payer: Self-pay

## 2014-07-26 NOTE — Telephone Encounter (Signed)
LM PTCB ask for Arbie Cookey to review results. [place] number given

## 2014-07-27 ENCOUNTER — Encounter: Payer: Self-pay | Admitting: Internal Medicine

## 2014-07-27 LAB — MYOCARDIAL PERFUSION IMAGING
Estimated workload: 12.5 METS
Exercise duration (min): 10 min
Exercise duration (sec): 30 s
LV dias vol: 73 mL
LV sys vol: 25 mL
MPHR: 168 {beats}/min
Nuc Stress EF: 66 %
Peak HR: 162 {beats}/min
Percent HR: 97 %
Percent of predicted max HR: 96 %
RATE: 0.35
Rest HR: 59 {beats}/min
SDS: 2
SRS: 0
SSS: 2
Stage 1 DBP: 73 mmHg
Stage 1 Grade: 0 %
Stage 1 HR: 67 {beats}/min
Stage 1 SBP: 111 mmHg
Stage 1 Speed: 0 mph
Stage 2 DBP: 75 mmHg
Stage 2 Grade: 0 %
Stage 2 HR: 60 {beats}/min
Stage 2 SBP: 120 mmHg
Stage 2 Speed: 0 mph
Stage 3 Grade: 0 %
Stage 3 HR: 60 {beats}/min
Stage 3 Speed: 0 mph
Stage 4 DBP: 73 mmHg
Stage 4 Grade: 10 %
Stage 4 HR: 97 {beats}/min
Stage 4 SBP: 140 mmHg
Stage 4 Speed: 1.7 mph
Stage 5 DBP: 81 mmHg
Stage 5 Grade: 12 %
Stage 5 HR: 111 {beats}/min
Stage 5 SBP: 136 mmHg
Stage 5 Speed: 2.5 mph
Stage 6 DBP: 78 mmHg
Stage 6 Grade: 14 %
Stage 6 HR: 133 {beats}/min
Stage 6 SBP: 155 mmHg
Stage 6 Speed: 3.4 mph
Stage 7 Grade: 16 %
Stage 7 HR: 162 {beats}/min
Stage 7 Speed: 4.2 mph
Stage 8 DBP: 101 mmHg
Stage 8 Grade: 0 %
Stage 8 HR: 134 {beats}/min
Stage 8 SBP: 162 mmHg
Stage 8 Speed: 0 mph
Stage 9 DBP: 80 mmHg
Stage 9 Grade: 0 %
Stage 9 HR: 83 {beats}/min
Stage 9 SBP: 115 mmHg
Stage 9 Speed: 0 mph
TID: 0.87

## 2014-07-28 NOTE — Telephone Encounter (Signed)
Pt notified of normal myoview with verbal understanding to results given today by phone. Will fax results to PCP as well. Pt said thank you.

## 2014-08-02 ENCOUNTER — Encounter: Payer: Self-pay | Admitting: Internal Medicine

## 2014-08-02 ENCOUNTER — Ambulatory Visit (INDEPENDENT_AMBULATORY_CARE_PROVIDER_SITE_OTHER): Payer: BLUE CROSS/BLUE SHIELD | Admitting: Internal Medicine

## 2014-08-02 ENCOUNTER — Encounter (HOSPITAL_COMMUNITY): Payer: Self-pay | Admitting: Emergency Medicine

## 2014-08-02 ENCOUNTER — Emergency Department (HOSPITAL_COMMUNITY)
Admission: EM | Admit: 2014-08-02 | Discharge: 2014-08-02 | Discharge status: Against Medical Advice | Payer: BLUE CROSS/BLUE SHIELD | Attending: Emergency Medicine | Admitting: Emergency Medicine

## 2014-08-02 VITALS — BP 110/72 | HR 76 | Ht 67.0 in | Wt 166.7 lb

## 2014-08-02 DIAGNOSIS — H539 Unspecified visual disturbance: Secondary | ICD-10-CM

## 2014-08-02 DIAGNOSIS — F418 Other specified anxiety disorders: Secondary | ICD-10-CM

## 2014-08-02 DIAGNOSIS — R208 Other disturbances of skin sensation: Secondary | ICD-10-CM

## 2014-08-02 DIAGNOSIS — R002 Palpitations: Secondary | ICD-10-CM

## 2014-08-02 DIAGNOSIS — R51 Headache: Secondary | ICD-10-CM | POA: Diagnosis present

## 2014-08-02 DIAGNOSIS — R200 Anesthesia of skin: Secondary | ICD-10-CM

## 2014-08-02 DIAGNOSIS — H538 Other visual disturbances: Secondary | ICD-10-CM

## 2014-08-02 DIAGNOSIS — G458 Other transient cerebral ischemic attacks and related syndromes: Secondary | ICD-10-CM

## 2014-08-02 LAB — COMPREHENSIVE METABOLIC PANEL
ALT: 25 U/L (ref 17–63)
AST: 27 U/L (ref 15–41)
Albumin: 3.8 g/dL (ref 3.5–5.0)
Alkaline Phosphatase: 56 U/L (ref 38–126)
Anion gap: 10 (ref 5–15)
BUN: 17 mg/dL (ref 6–20)
CO2: 25 mmol/L (ref 22–32)
Calcium: 9.2 mg/dL (ref 8.9–10.3)
Chloride: 106 mmol/L (ref 101–111)
Creatinine, Ser: 1.31 mg/dL — ABNORMAL HIGH (ref 0.61–1.24)
GFR calc Af Amer: >60 mL/min (ref >60)
GFR calc non Af Amer: >60 mL/min (ref >60)
Glucose, Bld: 118 mg/dL — ABNORMAL HIGH (ref 70–99)
Potassium: 4.1 mmol/L (ref 3.5–5.1)
Sodium: 141 mmol/L (ref 135–145)
Total Bilirubin: 0.9 mg/dL (ref 0.3–1.2)
Total Protein: 6.8 g/dL (ref 6.5–8.1)

## 2014-08-02 LAB — CBC WITH DIFFERENTIAL/PLATELET
Basophils Absolute: 0 10*3/uL (ref 0.0–0.1)
Basophils Relative: 0 % (ref 0–1)
Eosinophils Absolute: 0.2 10*3/uL (ref 0.0–0.7)
Eosinophils Relative: 2 % (ref 0–5)
HCT: 39.5 % (ref 39.0–52.0)
Hemoglobin: 13 g/dL (ref 13.0–17.0)
Lymphocytes Relative: 27 % (ref 12–46)
Lymphs Abs: 2.4 10*3/uL (ref 0.7–4.0)
MCH: 28 pg (ref 26.0–34.0)
MCHC: 32.9 g/dL (ref 30.0–36.0)
MCV: 85.1 fL (ref 78.0–100.0)
Monocytes Absolute: 0.7 10*3/uL (ref 0.1–1.0)
Monocytes Relative: 8 % (ref 3–12)
Neutro Abs: 5.7 10*3/uL (ref 1.7–7.7)
Neutrophils Relative %: 63 % (ref 43–77)
Platelets: 225 10*3/uL (ref 150–400)
RBC: 4.64 MIL/uL (ref 4.22–5.81)
RDW: 12.4 % (ref 11.5–15.5)
WBC: 9 10*3/uL (ref 4.0–10.5)

## 2014-08-02 NOTE — Patient Instructions (Signed)
Dr. Debara Pickett has referred you to Dr. Carles Collet with [facility]   Your physician recommends that you schedule a follow-up appointment as needed.

## 2014-08-02 NOTE — ED Notes (Addendum)
Pt. presents with multiple complaints : headache , scalp numbness , left arm numbness and seeing spots this evening  , smptoms resolved at arrival . Alert and oriented , speech clear/ equal grips with no arm drift ,respirations unlabored.

## 2014-08-02 NOTE — ED Notes (Signed)
Patient stated that he was leaving. That he felt better.

## 2014-08-03 NOTE — Progress Notes (Signed)
OFFICE NOTE  Chief Complaint:  Visual changes  Primary Care Physician: Todd Koyanagi, MD  HPI:  Todd [name] is a 54 year old male who was previously seen at [facility] heart and vascular by Dr. Elisabeth Cara. He underwent a stress test, echocardiogram and monitor which were unrevealing. He was told he was well and did not need to follow back up with Todd [name]. He recently had an episode where he had some visual abnormalities including seeing white spots. He presented to the emergency room and there was concern for TIA or stroke. Workup was generally unremarkable except for some cerebral atherosclerosis. It was also considered that he might have had a migraine although he had no headache and has no history of headaches. He currently denies any chest pain although does get some mild shortness of breath with exertion. 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 He does have a family history of heart disease in his father who apparently had an enlarged heart and fluid around the heart. He also has a history of anxiety and was briefly a medication for anxiety and depression in the past. He takes Lipitor for elevated LDL cholesterol recently which was 130.  I saw Todd [name] back in the office today. He denies any worsening palpitations. He was in the emergency room apparently this morning with blurred vision and seeing spots. He also had a headache on the right side of his head. He left apparently before being fully evaluated. He's had episodes like this before. There is question is whether this could be complicated migraine or TIA. He denies any chest pain or worsening shortness of breath.  PMHx:  Past Medical History  Diagnosis Date  . TIA (transient ischemic attack)   . Palpitations   . Hx of echocardiogram     echo 6/15:  EF 60-65%, no RWMA  . Hx of Doppler ultrasound     Carotid Todd [name] 6/15:  bilat ICA 1-39%  . Hx of cardiovascular stress  test     Myoview 8/10:  no ischemia, EF 62%; low risk    Past Surgical History  Procedure Laterality Date  . Laparoscopic appendectomy  12/02/2011    Procedure: APPENDECTOMY LAPAROSCOPIC;  Surgeon: Madilyn Hook, DO;  Location: [facility];  Service: General;  Laterality: N/A;  . Appendectomy  12/01/11    lap appy    FAMHx:  Family History  Problem Relation Age of Onset  . Hypertension Mother   . Hypertension Father   . Anemia Neg Hx   . Arrhythmia Neg Hx   . Asthma Neg Hx   . Clotting disorder Neg Hx   . Fainting Neg Hx   . Heart attack Neg Hx   . Heart disease Neg Hx   . Heart failure Neg Hx   . Hyperlipidemia Neg Hx   . Colon cancer Neg Hx     SOCHx:   reports that he has never smoked. He has never used smokeless tobacco. He reports that he does not drink alcohol or use illicit drugs.  ALLERGIES:  Allergies  Allergen Reactions  . Wellbutrin [Bupropion Hcl] Itching    ROS: A comprehensive review of systems was negative except for: Neurological: positive for Seeing spots, blurry vision, headache  HOME MEDS: Current Outpatient Prescriptions  Medication Sig Dispense Refill  . benzonatate (TESSALON) 100 MG capsule Take 100 mg by mouth 2 (two) times daily as needed.     . bisacodyl (DULCOLAX)  5 MG EC tablet Take 5 mg by mouth once. One time use for colonoscopy    . meloxicam (MOBIC) 15 MG tablet Take 1 tablet (15 mg total) by mouth daily. For knee pain 30 tablet 0  . polyethylene glycol powder (GLYCOLAX/MIRALAX) powder Take 1 Container by mouth once. One time use for colonscopy     No current facility-administered medications for this visit.    LABS/IMAGING: Results for orders placed or performed during the hospital encounter of 08/02/14 (from the past 48 hour(s))  CBC with Differential     Status: None   Collection Time: 08/02/14  1:31 AM  Result Value Ref Range   WBC 9.0 4.0 - 10.5 K/uL   RBC 4.64 4.22 - 5.81 MIL/uL   Hemoglobin 13.0 13.0 - 17.0 g/dL   HCT 39.5 39.0  - 52.0 %   MCV 85.1 78.0 - 100.0 fL   MCH 28.0 26.0 - 34.0 pg   MCHC 32.9 30.0 - 36.0 g/dL   RDW 12.4 11.5 - 15.5 %   Platelets 225 150 - 400 K/uL   Neutrophils Relative % 63 43 - 77 %   Neutro Abs 5.7 1.7 - 7.7 K/uL   Lymphocytes Relative 27 12 - 46 %   Lymphs Abs 2.4 0.7 - 4.0 K/uL   Monocytes Relative 8 3 - 12 %   Monocytes Absolute 0.7 0.1 - 1.0 K/uL   Eosinophils Relative 2 0 - 5 %   Eosinophils Absolute 0.2 0.0 - 0.7 K/uL   Basophils Relative 0 0 - 1 %   Basophils Absolute 0.0 0.0 - 0.1 K/uL  Comprehensive metabolic panel     Status: Abnormal   Collection Time: 08/02/14  1:31 AM  Result Value Ref Range   Sodium 141 135 - 145 mmol/L   Potassium 4.1 3.5 - 5.1 mmol/L   Chloride 106 101 - 111 mmol/L   CO2 25 22 - 32 mmol/L   Glucose, Bld 118 (H) 70 - 99 mg/dL   BUN 17 6 - 20 mg/dL   Creatinine, Ser 1.31 (H) 0.61 - 1.24 mg/dL   Calcium 9.2 8.9 - 10.3 mg/dL   Total Protein 6.8 6.5 - 8.1 g/dL   Albumin 3.8 3.5 - 5.0 g/dL   AST 27 15 - 41 U/L   ALT 25 17 - 63 U/L   Alkaline Phosphatase 56 38 - 126 U/L   Total Bilirubin 0.9 0.3 - 1.2 mg/dL   GFR calc non Af Amer >60 >60 mL/min   GFR calc Af Amer >60 >60 mL/min    Comment: (NOTE) The eGFR has been calculated using the CKD EPI equation. This calculation has not been validated in all clinical situations. eGFR's persistently <60 mL/min signify possible Chronic Kidney Disease.    Anion gap 10 5 - 15   No results found.  VITALS: BP 110/72 mmHg  Pulse 76  Ht '5\' 7"'  (1.702 m)  Wt 166 lb 11.2 oz (75.615 kg)  BMI 26.10 kg/m2  EXAM: General appearance: alert and no distress Neck: no carotid bruit and no JVD Lungs: clear to auscultation bilaterally Heart: regular rate and rhythm, S1, S2 normal, no murmur, click, rub or gallop Abdomen: soft, non-tender; bowel sounds normal; no masses,  no organomegaly Extremities: extremities normal, atraumatic, no cyanosis or edema Pulses: 2+ and symmetric Skin: Skin color, texture, turgor  normal. No rashes or lesions Neurologic: Grossly normal Psych: Mildly anxious  EKG: Deferred  ASSESSMENT: 1. Visual changes with headache 2. Mild dyspnea with exertion  3. Possible recent TIA 4. Atherosclerosis/dyslipidemia 5. Anxiety  PLAN: 1.   Mr. Jump is describing vision changes with headache and neurologic symptoms that need further evaluation. He was seen in the emergency department today however apparently left before being fully treated. Apparently has had an MRI before which is unremarkable. I like to refer her back to neurology for full evaluation. I don't feel that any of his symptoms are cardiac in nature.  Pixie Casino, MD, [facility] Attending Cardiologist [place] 08/03/2014, 4:52 PM

## 2014-08-07 ENCOUNTER — Telehealth: Payer: Self-pay | Admitting: Internal Medicine

## 2014-08-07 NOTE — Telephone Encounter (Signed)
If symptoms completely resolved, then okay with proceeding with procedure Will cc: anesthesia

## 2014-08-07 NOTE — Telephone Encounter (Signed)
Returned call to patient and he was concerned about him having to go to the emergency room for trouble with his vision on May 11th.  He states that he waited for 2 hours at the E.D. And left because he was feeling better.  I asked him how he was feeling now and he states that he feels fine.  I advised patient that we would proceed with the procedure as scheduled unless otherwise directed by Dr. Hilarie Fredrickson.  I would send this note to Dr. Hilarie Fredrickson so he can review E.D. Notes.  If Dr. Hilarie Fredrickson want to do anything different, we would contact patient before time to drink his first prep.  He agreed and states he will continue with prep as instructed if he doesn't hear anything from [place].  All questions were answered and note routed to Dr. Hilarie Fredrickson.

## 2014-08-08 ENCOUNTER — Encounter: Payer: Self-pay | Admitting: Internal Medicine

## 2014-08-08 ENCOUNTER — Ambulatory Visit (AMBULATORY_SURGERY_CENTER): Payer: BLUE CROSS/BLUE SHIELD | Admitting: Internal Medicine

## 2014-08-08 ENCOUNTER — Other Ambulatory Visit (INDEPENDENT_AMBULATORY_CARE_PROVIDER_SITE_OTHER): Payer: BLUE CROSS/BLUE SHIELD

## 2014-08-08 ENCOUNTER — Other Ambulatory Visit: Payer: Self-pay | Admitting: Internal Medicine

## 2014-08-08 ENCOUNTER — Other Ambulatory Visit: Payer: Self-pay

## 2014-08-08 VITALS — BP 94/66 | HR 63 | Temp 97.4°F | Resp 18 | Ht 66.0 in | Wt 167.0 lb

## 2014-08-08 DIAGNOSIS — Z1211 Encounter for screening for malignant neoplasm of colon: Secondary | ICD-10-CM | POA: Diagnosis present

## 2014-08-08 DIAGNOSIS — K625 Hemorrhage of anus and rectum: Secondary | ICD-10-CM | POA: Diagnosis not present

## 2014-08-08 DIAGNOSIS — D123 Benign neoplasm of transverse colon: Secondary | ICD-10-CM

## 2014-08-08 LAB — APTT: aPTT: 33.6 s — ABNORMAL HIGH (ref 23.4–32.7)

## 2014-08-08 LAB — PROTIME-INR
INR: 1.1 ratio — ABNORMAL HIGH (ref 0.8–1.0)
Prothrombin Time: 12.2 s (ref 9.6–13.1)

## 2014-08-08 MED ORDER — AMBULATORY NON FORMULARY MEDICATION: Status: DC

## 2014-08-08 MED ORDER — SODIUM CHLORIDE 0.9 % IV SOLN: 500.0000 mL | INTRAVENOUS | Status: DC

## 2014-08-08 NOTE — Op Note (Signed)
[place]  [address] [place], [id number]   COLONOSCOPY PROCEDURE REPORT  PATIENT: Todd [name], Todd [name]  MR#: [id number] BIRTHDATE: September 28, 1960 , 54  yrs. old GENDER: male ENDOSCOPIST: Jerene Bears, MD REFERRED KQ:ASUORV Laney Pastor, M.D. PROCEDURE DATE:  08/08/2014 PROCEDURE:   Colonoscopy, screening First Screening Colonoscopy - Avg.  risk and is 50 yrs.  old or older Yes.  Prior Negative Screening - Now for repeat screening. N/A  History of Adenoma - Now for follow-up colonoscopy & has been > or = to 3 yrs.  N/A  Polyps removed today? Yes ASA CLASS:   Class II INDICATIONS:Screening for colonic neoplasia and Colorectal Neoplasm Risk Assessment for this procedure is average risk. MEDICATIONS: Monitored anesthesia care and Propofol 230 mg IV  DESCRIPTION OF PROCEDURE:   After the risks benefits and alternatives of the procedure were thoroughly explained, informed consent was obtained.  The digital rectal exam revealed an anal fissure and revealed external hemorrhoids.   The LB [id number]  endoscope was introduced through the anus and advanced to the cecum, which was identified by both the appendix and ileocecal valve. No adverse events experienced.   The quality of the prep was (Suprep was used) good.  The instrument was then slowly withdrawn as the colon was fully examined. Estimated blood loss is zero unless otherwise noted in this procedure report.  COLON FINDINGS: A sessile polyp measuring 2 mm in size was found in the transverse colon.  A polypectomy was performed with cold forceps.  The resection was complete, the polyp tissue was completely retrieved and sent to histology. There was an unusual amount of bleeding from a single cold forcep polypectomy which did not see spontaneously with observation. Bleeding at the site was controlled using 1 hemoclip with success.  There was mild diverticulosis noted in the ascending colon and at the hepatic flexure.    The examination was otherwise normal.  Retroflexed views revealed internal and external hemorrhoids.  Anal fissure was seen with scope withdrawal. The time to cecum = 3.9 Withdrawal time = 11.9   The scope was withdrawn and the procedure completed. COMPLICATIONS: There were no immediate complications.  ENDOSCOPIC IMPRESSION: 1.   Sessile polyp was found in the transverse colon; polypectomy was performed with cold forceps; bleeding at the site was controlled using hemoclips 2.   Mild diverticulosis was noted in the ascending colon and at the hepatic flexure 3.   The examination was otherwise normal 4.   Mixed internal and external hemorrhoids and anal fissure (posterior)  RECOMMENDATIONS: 1.  Await pathology results 2.  Nitroglycerin ointment 0.125% applied 3 times daily to anal can now for anal fissure - 4-6 weeks 3.  Schedule hemorrhoidal banding for internal hemorrhoidal treatment 4.  Check PT and PTT  eSigned:  Jerene Bears, MD 08/08/2014 2:09 PM     cc: Tami Lin, MD and The Patient

## 2014-08-08 NOTE — Progress Notes (Signed)
Patient stating his wife will drive him home.

## 2014-08-08 NOTE — Progress Notes (Signed)
Called to room to assist during endoscopic procedure.  Patient ID and intended procedure confirmed with present staff. Received instructions for my participation in the procedure from the performing physician.  

## 2014-08-08 NOTE — Progress Notes (Signed)
To recovery, report to McCoy, RN, VSS 

## 2014-08-08 NOTE — Patient Instructions (Signed)
Discharge instructions given. Handouts on polyps,diverticulosis and hemorrhoids. Prescription sent in to pharmacy. Resume previous medications. YOU HAD AN ENDOSCOPIC PROCEDURE TODAY AT [facility]:   Refer to the procedure report that was given to you for any specific questions about what was found during the examination.  If the procedure report does not answer your questions, please call your gastroenterologist to clarify.  If you requested that your care partner not be given the details of your procedure findings, then the procedure report has been included in a sealed envelope for you to review at your convenience later.  YOU SHOULD EXPECT: Some feelings of bloating in the abdomen. Passage of more gas than usual.  Walking can help get rid of the air that was put into your GI tract during the procedure and reduce the bloating. If you had a lower endoscopy (such as a colonoscopy or flexible sigmoidoscopy) you may notice spotting of blood in your stool or on the toilet paper. If you underwent a bowel prep for your procedure, you may not have a normal bowel movement for a few days.  Please Note:  You might notice some irritation and congestion in your nose or some drainage.  This is from the oxygen used during your procedure.  There is no need for concern and it should clear up in a day or so.  SYMPTOMS TO REPORT IMMEDIATELY:   Following lower endoscopy (colonoscopy or flexible sigmoidoscopy):  Excessive amounts of blood in the stool  Significant tenderness or worsening of abdominal pains  Swelling of the abdomen that is new, acute  Fever of 100F or higher   For urgent or emergent issues, a gastroenterologist can be reached at any hour by calling [phone].   DIET: Your first meal following the procedure should be a small meal and then it is ok to progress to your normal diet. Heavy or fried foods are harder to digest and may make you feel nauseous or bloated.  Likewise,  meals heavy in dairy and vegetables can increase bloating.  Drink plenty of fluids but you should avoid alcoholic beverages for 24 hours.  ACTIVITY:  You should plan to take it easy for the rest of today and you should NOT DRIVE or use heavy machinery until tomorrow (because of the sedation medicines used during the test).    FOLLOW UP: Our staff will call the number listed on your records the next business day following your procedure to check on you and address any questions or concerns that you may have regarding the information given to you following your procedure. If we do not reach you, we will leave a message.  However, if you are feeling well and you are not experiencing any problems, there is no need to return our call.  We will assume that you have returned to your regular daily activities without incident.  If any biopsies were taken you will be contacted by phone or by letter within the next 1-3 weeks.  Please call us at [phone] if you have not heard about the biopsies in 3 weeks.    SIGNATURES/CONFIDENTIALITY: You and/or your care partner have signed paperwork which will be entered into your electronic medical record.  These signatures attest to the fact that that the information above on your After Visit Summary has been reviewed and is understood.  Full responsibility of the confidentiality of this discharge information lies with you and/or your care-partner.

## 2014-08-09 ENCOUNTER — Telehealth: Payer: Self-pay | Admitting: *Deleted

## 2014-08-09 ENCOUNTER — Telehealth: Payer: Self-pay | Admitting: Internal Medicine

## 2014-08-09 NOTE — Telephone Encounter (Signed)
Pt states he has been having rectal bleeding. Pt states it is not any worse  since the colon yesterday. Discussed with pt that he needs to pick up the medication that was sent to the pharmacy yesterday to help the fissure heal. Pt verbalized understanding and will call back if no improvement.

## 2014-08-09 NOTE — Telephone Encounter (Signed)
  Follow up Call-  Unable to reach pt. At numbers provided

## 2014-08-17 ENCOUNTER — Encounter: Payer: Self-pay | Admitting: *Deleted

## 2014-08-20 ENCOUNTER — Encounter: Payer: Self-pay | Admitting: Internal Medicine

## 2014-09-11 ENCOUNTER — Ambulatory Visit: Payer: BLUE CROSS/BLUE SHIELD | Admitting: Neurology

## 2014-09-19 ENCOUNTER — Ambulatory Visit: Payer: BLUE CROSS/BLUE SHIELD | Admitting: Neurology

## 2014-09-20 ENCOUNTER — Encounter: Payer: BLUE CROSS/BLUE SHIELD | Admitting: Internal Medicine

## 2014-10-20 ENCOUNTER — Ambulatory Visit: Payer: BLUE CROSS/BLUE SHIELD | Admitting: Neurology

## 2015-01-02 ENCOUNTER — Emergency Department (HOSPITAL_COMMUNITY)
Admission: EM | Admit: 2015-01-02 | Discharge: 2015-01-02 | Disposition: A | Discharge status: Home / Self Care | Payer: BLUE CROSS/BLUE SHIELD | Attending: Emergency Medicine | Admitting: Emergency Medicine

## 2015-01-02 DIAGNOSIS — Z8719 Personal history of other diseases of the digestive system: Secondary | ICD-10-CM | POA: Insufficient documentation

## 2015-01-02 DIAGNOSIS — Z8673 Personal history of transient ischemic attack (TIA), and cerebral infarction without residual deficits: Secondary | ICD-10-CM | POA: Insufficient documentation

## 2015-01-02 DIAGNOSIS — M79602 Pain in left arm: Secondary | ICD-10-CM | POA: Insufficient documentation

## 2015-01-02 DIAGNOSIS — Z86018 Personal history of other benign neoplasm: Secondary | ICD-10-CM | POA: Insufficient documentation

## 2015-01-02 DIAGNOSIS — G43109 Migraine with aura, not intractable, without status migrainosus: Secondary | ICD-10-CM | POA: Insufficient documentation

## 2015-01-02 MED ORDER — PROCHLORPERAZINE MALEATE 10 MG PO TABS: 10.0000 mg | ORAL_TABLET | Freq: Three times a day (TID) | ORAL | Status: DC | PRN

## 2015-01-02 MED ORDER — NAPROXEN 500 MG PO TABS: 500.0000 mg | ORAL_TABLET | Freq: Two times a day (BID) | ORAL | Status: DC | PRN

## 2015-01-02 NOTE — ED Notes (Signed)
MD at bedside. 

## 2015-01-02 NOTE — ED Notes (Addendum)
Pt reports he was driving to work at 0923 this am when be began to see white spots. Once he got to work he began to have L arm pain and numbness. Pt reports spots have decreased prior to arrival. Also has a HA. Hx of TIA in 2015. Pt alert and oriented. No extremity drift. Full ROM in L arm. No facial droop.

## 2015-01-02 NOTE — Discharge Instructions (Signed)

## 2015-01-02 NOTE — ED Provider Notes (Signed)
CSN: [id number]     Arrival date & time 01/02/15  1027 History   First MD Initiated Contact with Patient 01/02/15 1052     Chief Complaint  Patient presents with  . Arm Pain  . Spots and/or Floaters     (Consider location/radiation/quality/duration/timing/severity/associated sxs/prior Treatment) Patient is a 54 y.o. male presenting with arm pain. The history is provided by the patient.  Arm Pain Pertinent negatives include no chest pain, no abdominal pain and no shortness of breath.   patient presents with white spots in his vision and left arm pain. States it began while he is driving to work. He states it resolved when he came back. No headache. States he has had episodes like this before. States he was admitted to the hospital and told it was a TIA versus complicated migraine. States the symptoms were similar this time except last time he had difficulty recognizing his wife. No difficulty with that this time. States he now is back to normal. He has seen cardiology and been told he does not have heart problem.  Past Medical History  Diagnosis Date  . TIA (transient ischemic attack)   . Palpitations   . Hx of echocardiogram     echo 6/15:  EF 60-65%, no RWMA  . Hx of Doppler ultrasound     Carotid US 6/15:  bilat ICA 1-39%  . Hx of cardiovascular stress test     Myoview 8/10:  no ischemia, EF 62%; low risk  . Diverticulosis   . Internal hemorrhoids   . External hemorrhoids   . Tubular adenoma of colon    Past Surgical History  Procedure Laterality Date  . Laparoscopic appendectomy  12/02/2011    Procedure: APPENDECTOMY LAPAROSCOPIC;  Surgeon: Madilyn Hook, DO;  Location: [facility];  Service: General;  Laterality: N/A;  . Appendectomy  12/01/11    lap appy   Family History  Problem Relation Age of Onset  . Hypertension Mother   . Hypertension Father   . Anemia Neg Hx   . Arrhythmia Neg Hx   . Asthma Neg Hx   . Clotting disorder Neg Hx   . Fainting Neg Hx   . Heart attack Neg  Hx   . Heart disease Neg Hx   . Heart failure Neg Hx   . Hyperlipidemia Neg Hx   . Colon cancer Neg Hx    Social History  Substance Use Topics  . Smoking status: Never Smoker   . Smokeless tobacco: Never Used  . Alcohol Use: No    Review of Systems  Constitutional: Negative for activity change.  Eyes: Positive for visual disturbance.  Respiratory: Negative for shortness of breath.   Cardiovascular: Negative for chest pain.  Gastrointestinal: Negative for abdominal pain.  Genitourinary: Negative for flank pain.  Musculoskeletal: Negative for back pain.  Skin: Negative for wound.  Neurological: Negative for speech difficulty, weakness and numbness.  Hematological: Negative for adenopathy.  Psychiatric/Behavioral: Negative for confusion.      Allergies  Wellbutrin  Home Medications   Prior to Admission medications   Medication Sig Start Date End Date Taking? Authorizing Provider  ibuprofen (ADVIL,MOTRIN) 200 MG tablet Take 200-400 mg by mouth every 6 (six) hours as needed for headache, mild pain or moderate pain.   Yes Historical Provider, MD  AMBULATORY NON FORMULARY MEDICATION Medication Name: Nitroglycerin 0.125% ointment Apply to rectal area three times daily for anal fissure Patient not taking: Reported on 01/02/2015 08/08/14   Jerene Bears, MD  meloxicam (MOBIC) 15 MG tablet Take 1 tablet (15 mg total) by mouth daily. For knee pain Patient not taking: Reported on 08/08/2014 06/17/14   Leandrew Koyanagi, MD  naproxen (NAPROSYN) 500 MG tablet Take 1 tablet (500 mg total) by mouth 2 (two) times daily as needed for headache. 01/02/15   Davonna Belling, MD  prochlorperazine (COMPAZINE) 10 MG tablet Take 1 tablet (10 mg total) by mouth every 8 (eight) hours as needed (headache). 01/02/15   Davonna Belling, MD   BP 100/67 mmHg  Pulse 56  Temp(Src) 97.4 F (36.3 C) (Oral)  Resp 16  SpO2 98% Physical Exam  Constitutional: He appears well-developed.  Cardiovascular:  Normal rate.   Pulmonary/Chest: Effort normal.    ED Course  Procedures (including critical care time) Labs Review Labs Reviewed - No data to display  Imaging Review No results found. I have personally reviewed and evaluated these images and lab results as part of my medical decision-making.   EKG Interpretation   Date/Time:  Tuesday January 02 2015 10:50:48 EDT Ventricular Rate:  61 PR Interval:  149 QRS Duration: 95 QT Interval:  432 QTC Calculation: 435 R Axis:   70 Text Interpretation:  Sinus rhythm Low voltage, precordial leads No  significant change since last tracing Confirmed by Neko Mcgeehan  MD, Makana Rostad  [phone]) on 01/02/2015 10:59:12 AM      MDM   Final diagnoses:  Complicated migraine    Patient with visual changes and questionable neurologic deficits. Resolved upon my examination. Has had similar episodes in the past. Thought to be TIA versus discuss with neurology, Dr. Leonel Ramsay, at this point likely K migraine. Does not appear to need further evaluation discharge home.    Davonna Belling, MD 01/02/15 [phone]

## 2015-02-19 ENCOUNTER — Encounter: Payer: Self-pay | Admitting: Internal Medicine

## 2015-04-22 ENCOUNTER — Encounter (HOSPITAL_COMMUNITY): Payer: Self-pay | Admitting: Emergency Medicine

## 2015-04-22 ENCOUNTER — Emergency Department (HOSPITAL_COMMUNITY)
Admission: EM | Admit: 2015-04-22 | Discharge: 2015-04-22 | Disposition: A | Discharge status: Home / Self Care | Payer: BLUE CROSS/BLUE SHIELD | Attending: Emergency Medicine | Admitting: Emergency Medicine

## 2015-04-22 ENCOUNTER — Emergency Department (HOSPITAL_COMMUNITY): Payer: BLUE CROSS/BLUE SHIELD

## 2015-04-22 DIAGNOSIS — Z8673 Personal history of transient ischemic attack (TIA), and cerebral infarction without residual deficits: Secondary | ICD-10-CM | POA: Insufficient documentation

## 2015-04-22 DIAGNOSIS — Z8719 Personal history of other diseases of the digestive system: Secondary | ICD-10-CM | POA: Insufficient documentation

## 2015-04-22 DIAGNOSIS — R112 Nausea with vomiting, unspecified: Secondary | ICD-10-CM | POA: Diagnosis not present

## 2015-04-22 DIAGNOSIS — R6883 Chills (without fever): Secondary | ICD-10-CM | POA: Diagnosis not present

## 2015-04-22 DIAGNOSIS — Z7982 Long term (current) use of aspirin: Secondary | ICD-10-CM | POA: Diagnosis not present

## 2015-04-22 DIAGNOSIS — R0602 Shortness of breath: Secondary | ICD-10-CM | POA: Diagnosis not present

## 2015-04-22 DIAGNOSIS — R002 Palpitations: Secondary | ICD-10-CM | POA: Diagnosis not present

## 2015-04-22 DIAGNOSIS — Z86018 Personal history of other benign neoplasm: Secondary | ICD-10-CM | POA: Diagnosis not present

## 2015-04-22 DIAGNOSIS — R531 Weakness: Secondary | ICD-10-CM | POA: Diagnosis present

## 2015-04-22 LAB — CBC
HCT: 40.7 % (ref 39.0–52.0)
Hemoglobin: 13.6 g/dL (ref 13.0–17.0)
MCH: 29.4 pg (ref 26.0–34.0)
MCHC: 33.4 g/dL (ref 30.0–36.0)
MCV: 87.9 fL (ref 78.0–100.0)
Platelets: 207 10*3/uL (ref 150–400)
RBC: 4.63 MIL/uL (ref 4.22–5.81)
RDW: 12.5 % (ref 11.5–15.5)
WBC: 7.3 10*3/uL (ref 4.0–10.5)

## 2015-04-22 LAB — COMPREHENSIVE METABOLIC PANEL
ALT: 23 U/L (ref 17–63)
AST: 31 U/L (ref 15–41)
Albumin: 4.3 g/dL (ref 3.5–5.0)
Alkaline Phosphatase: 49 U/L (ref 38–126)
Anion gap: 8 (ref 5–15)
BUN: 20 mg/dL (ref 6–20)
CO2: 25 mmol/L (ref 22–32)
Calcium: 9 mg/dL (ref 8.9–10.3)
Chloride: 108 mmol/L (ref 101–111)
Creatinine, Ser: 0.9 mg/dL (ref 0.61–1.24)
GFR calc Af Amer: >60 mL/min (ref >60)
GFR calc non Af Amer: >60 mL/min (ref >60)
Glucose, Bld: 112 mg/dL — ABNORMAL HIGH (ref 65–99)
Potassium: 4.2 mmol/L (ref 3.5–5.1)
Sodium: 141 mmol/L (ref 135–145)
Total Bilirubin: 1.5 mg/dL — ABNORMAL HIGH (ref 0.3–1.2)
Total Protein: 7.3 g/dL (ref 6.5–8.1)

## 2015-04-22 LAB — I-STAT TROPONIN, ED: Troponin i, poc: 0.01 ng/mL (ref 0.00–0.08)

## 2015-04-22 NOTE — ED Provider Notes (Signed)
CSN: [id number]     Arrival date & time 04/22/15  [id number] History  By signing my name below, I, Todd [name], attest that this documentation has been prepared under the direction and in the presence of Dorie Rank, MD . Electronically Signed: Dora [name], Scribe. 04/22/2015. 3:39 AM.    Chief Complaint  Patient presents with  . Shortness of Breath  . Weakness  . Chills      Patient is a 55 y.o. male presenting with shortness of breath and weakness. The history is provided by the patient. No language interpreter was used.  Shortness of Breath Severity:  Moderate Onset quality:  Sudden Duration:  3 hours Timing:  Intermittent Progression:  Improving Chronicity:  Recurrent Relieved by:  None tried Worsened by:  Nothing tried Associated symptoms: vomiting (dry)   Associated symptoms: no fever   Weakness Associated symptoms include shortness of breath.     HPI Comments: Todd [name] is a 55 y.o. male with h/o palpitations and TIA who presents to the Emergency Department complaining of sudden onset, worsening SOB beginning earlier tonight. Pt reports that he was watching TV and began having difficulty taking a deep breath. He smoked hookah tonight with his friend and states that he typically does not smoke hookah regularly. He reports associated nausea w/ dry heaving, chills, and shaking. Pt states that he feels better currently. He states that he experienced increased heart palpitations on the way to the hospital tonight. He reports that over the past two months he has had difficulty breathing occuring every 2-3 hours. He denies chest pain, leg swelling, fever, chills, or any other associated symptoms at this time. Pt's prior cardiac history includes an echocardiogram, carotid doppler ultrasound, and a cardiovascular stress test.    Past Medical History  Diagnosis Date  . TIA (transient ischemic attack)   . Palpitations   . Hx of echocardiogram     echo 6/15:  EF 60-65%, no RWMA  . Hx  of Doppler ultrasound     Carotid US 6/15:  bilat ICA 1-39%  . Hx of cardiovascular stress test     Myoview 8/10:  no ischemia, EF 62%; low risk  . Diverticulosis   . Internal hemorrhoids   . External hemorrhoids   . Tubular adenoma of colon    Past Surgical History  Procedure Laterality Date  . Laparoscopic appendectomy  12/02/2011    Procedure: APPENDECTOMY LAPAROSCOPIC;  Surgeon: Madilyn Hook, DO;  Location: [facility];  Service: General;  Laterality: N/A;  . Appendectomy  12/01/11    lap appy   Family History  Problem Relation Age of Onset  . Hypertension Mother   . Hypertension Father   . Anemia Neg Hx   . Arrhythmia Neg Hx   . Asthma Neg Hx   . Clotting disorder Neg Hx   . Fainting Neg Hx   . Heart attack Neg Hx   . Heart disease Neg Hx   . Heart failure Neg Hx   . Hyperlipidemia Neg Hx   . Colon cancer Neg Hx    Social History  Substance Use Topics  . Smoking status: Never Smoker   . Smokeless tobacco: Never Used  . Alcohol Use: No    Review of Systems  Constitutional: Negative for fever and chills.  Respiratory: Positive for shortness of breath.   Cardiovascular: Negative for leg swelling.  Gastrointestinal: Positive for nausea and vomiting (dry).  Neurological: Positive for weakness.  All other systems reviewed and are negative.  Allergies  Wellbutrin  Home Medications   Prior to Admission medications   Medication Sig Start Date End Date Taking? Authorizing Provider  aspirin EC 81 MG tablet Take 81 mg by mouth daily.   Yes Historical Provider, MD  AMBULATORY NON FORMULARY MEDICATION Medication Name: Nitroglycerin 0.125% ointment Apply to rectal area three times daily for anal fissure Patient not taking: Reported on 01/02/2015 08/08/14   Jerene Bears, MD  meloxicam (MOBIC) 15 MG tablet Take 1 tablet (15 mg total) by mouth daily. For knee pain Patient not taking: Reported on 08/08/2014 06/17/14   Leandrew Koyanagi, MD  naproxen (NAPROSYN) 500 MG tablet  Take 1 tablet (500 mg total) by mouth 2 (two) times daily as needed for headache. Patient not taking: Reported on 04/22/2015 01/02/15   Davonna Belling, MD  prochlorperazine (COMPAZINE) 10 MG tablet Take 1 tablet (10 mg total) by mouth every 8 (eight) hours as needed (headache). Patient not taking: Reported on 04/22/2015 01/02/15   Davonna Belling, MD   BP 118/73 mmHg  Pulse 77  Temp(Src) 98.3 F (36.8 C) (Oral)  Resp 19  SpO2 99% Physical Exam  Constitutional: He appears well-developed and well-nourished. No distress.  HENT:  Head: Normocephalic and atraumatic.  Right Ear: External ear normal.  Left Ear: External ear normal.  Eyes: Conjunctivae are normal. Right eye exhibits no discharge. Left eye exhibits no discharge. No scleral icterus.  Neck: Neck supple. No tracheal deviation present.  Cardiovascular: Normal rate, regular rhythm and intact distal pulses.   Pulmonary/Chest: Effort normal and breath sounds normal. No stridor. No respiratory distress. He has no wheezes. He has no rales.  Abdominal: Soft. Bowel sounds are normal. He exhibits no distension. There is no tenderness. There is no rebound and no guarding.  Musculoskeletal: He exhibits no edema or tenderness.  Neurological: He is alert. He has normal strength. No cranial nerve deficit (no facial droop, extraocular movements intact, no slurred speech) or sensory deficit. He exhibits normal muscle tone. He displays no seizure activity. Coordination normal.  Skin: Skin is warm and dry. No rash noted.  Psychiatric: He has a normal mood and affect.  Nursing note and vitals reviewed.   ED Course  Procedures (including critical care time)  DIAGNOSTIC STUDIES: Oxygen Saturation is 100% on RA, normal by my interpretation.    COORDINATION OF CARE:  3:39 AM Discussed treatment plan with pt at bedside and pt agreed to plan.   Labs Review Labs Reviewed  COMPREHENSIVE METABOLIC PANEL - Abnormal; Notable for the following:     Glucose, Bld 112 (*)    Total Bilirubin 1.5 (*)    All other components within normal limits  CBC  I-STAT TROPOININ, ED    Imaging Review Dg Chest 2 View  04/22/2015  CLINICAL DATA:  Initial evaluation for acute shortness of breath, chills. EXAM: CHEST  2 VIEW COMPARISON:  The prior study from 04/29/2013. FINDINGS: The cardiac and mediastinal silhouettes are stable in size and contour, and remain within normal limits. The lungs are normally inflated. No airspace consolidation, pleural effusion, or pulmonary edema is identified. There is no pneumothorax. No acute osseous abnormality identified. IMPRESSION: No active cardiopulmonary disease. Electronically Signed   By: Jeannine Boga M.D.   On: 04/22/2015 04:50   I have personally reviewed and evaluated these images and lab results as part of my medical decision-making.   EKG Interpretation   Date/Time:  Sunday April 22 2015 04:30:51 EST Ventricular Rate:  69 PR Interval:  151  QRS Duration: 112 QT Interval:  419 QTC Calculation: 449 R Axis:   77 Text Interpretation:  Sinus rhythm Borderline intraventricular conduction  delay Abnormal R-wave progression, early transition Baseline wander in  lead(s) V1 No significant change since last tracing Confirmed by Rhythm Wigfall   MD-J, Gaven Eugene [id number]) on 04/22/2015 4:35:06 AM      MDM   Final diagnoses:  Palpitations   ED evaluation is reassuring. Normal Cardiac enzymes, CBC, BMET and CXR.  Doubt ACS, PE, PNA, PTX.  Sx may be related to anxiety.  Pt also requested a referral to neurology  At this time there does not appear to be any evidence of an acute emergency medical condition and the patient appears stable for discharge with appropriate outpatient follow up.   I personally performed the services described in this documentation, which was scribed in my presence.  The recorded information has been reviewed and is accurate.      Dorie Rank, MD 04/22/15 [id number]

## 2015-04-22 NOTE — Discharge Instructions (Signed)

## 2015-04-22 NOTE — ED Notes (Signed)
Per EMS, pt. drove to [facility]. With wife and complaint of SOB, weakness ,nausea and vomiting. Pt. Stated that he was doing well until after taking a shower at 1am this morning, had SOB , N/V. Pt. Stated that he smoked "hooka" for the first  time last night at 10pm with friends. Pt. Stated that having SOB with N/V is not new to him if he get sick. Claimed feeling better upon arrival to ED, 02 sat of 100% on room air , denies chest pain. No fever.

## 2015-04-22 NOTE — ED Notes (Signed)
Bed: WA03 Expected date:  Expected time:  Means of arrival:  Comments: 55 yo M abd pain,N/V/D

## 2015-05-13 ENCOUNTER — Emergency Department (HOSPITAL_COMMUNITY)
Admission: EM | Admit: 2015-05-13 | Discharge: 2015-05-13 | Disposition: A | Discharge status: Nursing Home (Includes ICF) | Payer: BLUE CROSS/BLUE SHIELD | Source: Home / Self Care | Attending: Family Medicine | Admitting: Family Medicine

## 2015-05-13 ENCOUNTER — Encounter (HOSPITAL_COMMUNITY): Payer: Self-pay | Admitting: *Deleted

## 2015-05-13 ENCOUNTER — Encounter (HOSPITAL_COMMUNITY): Payer: Self-pay | Admitting: Emergency Medicine

## 2015-05-13 ENCOUNTER — Emergency Department (HOSPITAL_COMMUNITY)
Admission: EM | Admit: 2015-05-13 | Discharge: 2015-05-14 | Disposition: A | Discharge status: Home / Self Care | Payer: BLUE CROSS/BLUE SHIELD | Attending: Emergency Medicine | Admitting: Emergency Medicine

## 2015-05-13 DIAGNOSIS — K649 Unspecified hemorrhoids: Secondary | ICD-10-CM | POA: Insufficient documentation

## 2015-05-13 DIAGNOSIS — K648 Other hemorrhoids: Secondary | ICD-10-CM | POA: Diagnosis not present

## 2015-05-13 MED ORDER — OXYCODONE-ACETAMINOPHEN 5-325 MG PO TABS
ORAL_TABLET | ORAL | Status: AC
  Filled 2015-05-13: qty 1

## 2015-05-13 MED ORDER — OXYCODONE-ACETAMINOPHEN 5-325 MG PO TABS
1.0000 | ORAL_TABLET | Freq: Once | ORAL | Status: AC
  Administered 2015-05-13: 1 via ORAL

## 2015-05-13 NOTE — ED Notes (Addendum)
C/O external hemorrhoid flare-up since last night with slight bleeding (bleeding has resolved).  Had hemorrhoid surgery 10 yrs ago "but it didn't work".  Has been using OTC hemorrhoid products.  Per record: pt has hx anal fissure

## 2015-05-13 NOTE — ED Notes (Signed)
Pt. reports hemorrhoid pain with slight bleeding onset yesterday unrelieved by OTC rectal cream , no bleeding at arrival .

## 2015-05-13 NOTE — ED Provider Notes (Signed)
CSN: [id number]     Arrival date & time 05/13/15  1747 History   First MD Initiated Contact with Patient 05/13/15 1845     Chief Complaint  Patient presents with  . Hemorrhoids   (Consider location/radiation/quality/duration/timing/severity/associated sxs/prior Treatment) Patient is a 55 y.o. male presenting with hematochezia. The history is provided by the patient.  Rectal Bleeding Quality:  Bright red Timing:  Constant Progression:  Worsening Chronicity:  Recurrent Context: anal fissures and hemorrhoids   Context: not constipation   Context comment:  H/o hemorrhoid surg, colonoscopy, worsening pain and swelling last eve, can't stand  pain anymore. Similar prior episodes: yes   Ineffective treatments:  Hemorrhoid cream Associated symptoms: no abdominal pain     Past Medical History  Diagnosis Date  . TIA (transient ischemic attack)   . Palpitations   . Hx of echocardiogram     echo 6/15:  EF 60-65%, no RWMA  . Hx of Doppler ultrasound     Carotid US 6/15:  bilat ICA 1-39%  . Hx of cardiovascular stress test     Myoview 8/10:  no ischemia, EF 62%; low risk  . Diverticulosis   . Internal hemorrhoids   . External hemorrhoids   . Tubular adenoma of colon    Past Surgical History  Procedure Laterality Date  . Laparoscopic appendectomy  12/02/2011    Procedure: APPENDECTOMY LAPAROSCOPIC;  Surgeon: Madilyn Hook, DO;  Location: [facility];  Service: General;  Laterality: N/A;  . Appendectomy  12/01/11    lap appy   Family History  Problem Relation Age of Onset  . Hypertension Mother   . Hypertension Father   . Anemia Neg Hx   . Arrhythmia Neg Hx   . Asthma Neg Hx   . Clotting disorder Neg Hx   . Fainting Neg Hx   . Heart attack Neg Hx   . Heart disease Neg Hx   . Heart failure Neg Hx   . Hyperlipidemia Neg Hx   . Colon cancer Neg Hx    Social History  Substance Use Topics  . Smoking status: Never Smoker   . Smokeless tobacco: Never Used  . Alcohol Use: No    Review  of Systems  Constitutional: Negative.   Gastrointestinal: Positive for blood in stool, hematochezia and rectal pain. Negative for abdominal pain.    Allergies  Wellbutrin  Home Medications   Prior to Admission medications   Medication Sig Start Date End Date Taking? Authorizing Provider  AMBULATORY NON FORMULARY MEDICATION Medication Name: Nitroglycerin 0.125% ointment Apply to rectal area three times daily for anal fissure Patient not taking: Reported on 01/02/2015 08/08/14   Jerene Bears, MD  aspirin EC 81 MG tablet Take 81 mg by mouth daily.    Historical Provider, MD  meloxicam (MOBIC) 15 MG tablet Take 1 tablet (15 mg total) by mouth daily. For knee pain Patient not taking: Reported on 08/08/2014 06/17/14   Leandrew Koyanagi, MD  naproxen (NAPROSYN) 500 MG tablet Take 1 tablet (500 mg total) by mouth 2 (two) times daily as needed for headache. Patient not taking: Reported on 04/22/2015 01/02/15   Davonna Belling, MD  prochlorperazine (COMPAZINE) 10 MG tablet Take 1 tablet (10 mg total) by mouth every 8 (eight) hours as needed (headache). Patient not taking: Reported on 04/22/2015 01/02/15   Davonna Belling, MD   Meds Ordered and Administered this Visit  Medications - No data to display  BP 117/80 mmHg  Pulse 70  Temp(Src) 97.4 F (  36.3 C) (Oral)  Resp 17  SpO2 100% No data found.   Physical Exam  Constitutional: He is oriented to person, place, and time. He appears well-developed and well-nourished. He appears distressed.  Abdominal: Soft. Bowel sounds are normal. He exhibits no mass. There is no tenderness. There is no rebound and no guarding.  Genitourinary: Rectal exam shows external hemorrhoid, internal hemorrhoid and tenderness.     Neurological: He is alert and oriented to person, place, and time.  Skin: Skin is warm and dry.  Nursing note and vitals reviewed.   ED Course  Procedures (including critical care time)  Labs Review Labs Reviewed - No data to  display  Imaging Review No results found.   Visual Acuity Review  Right Eye Distance:   Left Eye Distance:   Bilateral Distance:    Right Eye Near:   Left Eye Near:    Bilateral Near:         MDM   1. Internal and external prolapsed hemorrhoids   sent for surgical eval.     Billy Fischer, MD 05/13/15 [phone]

## 2015-05-13 NOTE — ED Notes (Signed)
Left post triage

## 2015-05-13 NOTE — ED Notes (Signed)
Report called to Doneen Poisson, ED RN.

## 2015-05-16 ENCOUNTER — Emergency Department (HOSPITAL_COMMUNITY)
Admission: EM | Admit: 2015-05-16 | Discharge: 2015-05-16 | Disposition: A | Discharge status: Home / Self Care | Payer: BLUE CROSS/BLUE SHIELD | Attending: Emergency Medicine | Admitting: Emergency Medicine

## 2015-05-16 ENCOUNTER — Encounter (HOSPITAL_COMMUNITY): Payer: Self-pay | Admitting: Emergency Medicine

## 2015-05-16 DIAGNOSIS — K644 Residual hemorrhoidal skin tags: Secondary | ICD-10-CM | POA: Diagnosis not present

## 2015-05-16 DIAGNOSIS — K649 Unspecified hemorrhoids: Secondary | ICD-10-CM | POA: Diagnosis present

## 2015-05-16 DIAGNOSIS — Z86018 Personal history of other benign neoplasm: Secondary | ICD-10-CM | POA: Insufficient documentation

## 2015-05-16 DIAGNOSIS — Z7982 Long term (current) use of aspirin: Secondary | ICD-10-CM | POA: Diagnosis not present

## 2015-05-16 DIAGNOSIS — Z8673 Personal history of transient ischemic attack (TIA), and cerebral infarction without residual deficits: Secondary | ICD-10-CM | POA: Insufficient documentation

## 2015-05-16 MED ORDER — HYDROCODONE-ACETAMINOPHEN 5-325 MG PO TABS: 2.0000 | ORAL_TABLET | ORAL | Status: DC | PRN

## 2015-05-16 MED ORDER — DOCUSATE SODIUM 100 MG PO CAPS: 100.0000 mg | ORAL_CAPSULE | Freq: Two times a day (BID) | ORAL | Status: DC

## 2015-05-16 MED ORDER — HYDROCODONE-ACETAMINOPHEN 5-325 MG PO TABS: 1.0000 | ORAL_TABLET | ORAL | Status: DC | PRN

## 2015-05-16 MED ORDER — HYDROCORTISONE 2.5 % RE CREA: TOPICAL_CREAM | RECTAL | Status: DC

## 2015-05-16 NOTE — ED Notes (Signed)
Pt from [facility] for eval of recurrent hemorrhoids, states had surgery years ago but now having pain with bowel movements and slight bleeding with increase in constipation and having to strain. Pt denies any abd pain reports came to ED 2 nights ago but wait was too long.

## 2015-05-16 NOTE — Discharge Instructions (Signed)
Hemorrhoids Hemorrhoids are puffy (swollen) veins around the rectum or anus. Hemorrhoids can cause pain, itching, bleeding, or irritation. HOME CARE  Eat foods with fiber, such as whole grains, beans, nuts, fruits, and vegetables. Ask your doctor about taking products with added fiber in them (fibersupplements).  Drink enough fluid to keep your pee (urine) clear or pale yellow.  Exercise often.  Go to the bathroom when you have the urge to poop. Do not wait.  Avoid straining to poop (bowel movement).  Keep the butt area dry and clean. Use wet toilet paper or moist paper towels.  Medicated creams and medicine inserted into the anus (anal suppository) may be used or applied as told.  Only take medicine as told by your doctor.  Take a warm water bath (sitz bath) for 15-20 minutes to ease pain. Do this 3-4 times a day.  Place ice packs on the area if it is tender or puffy. Use the ice packs between the warm water baths.  Put ice in a plastic bag.  Place a towel between your skin and the bag.  Leave the ice on for 15-20 minutes, 03-04 times a day.  Do not use a donut-shaped pillow or sit on the toilet for a long time. GET HELP RIGHT AWAY IF:   You have more pain that is not controlled by treatment or medicine.  You have bleeding that will not stop.  You have trouble or are unable to poop (bowel movement).  You have pain or puffiness outside the area of the hemorrhoids. MAKE SURE YOU:   Understand these instructions.  Will watch your condition.  Will get help right away if you are not doing well or get worse.   This information is not intended to replace advice given to you by your health care provider. Make sure you discuss any questions you have with your health care provider.   Document Released: 12/18/2007 Document Revised: 02/25/2012 Document Reviewed: 01/20/2012 Elsevier Interactive Patient Education 2016 [employer].  Hemorrhoids Hemorrhoids are swollen  veins around the rectum or anus. There are two types of hemorrhoids:   Internal hemorrhoids. These occur in the veins just inside the rectum. They may poke through to the outside and become irritated and painful.  External hemorrhoids. These occur in the veins outside the anus and can be felt as a painful swelling or hard lump near the anus. CAUSES  Pregnancy.   Obesity.   Constipation or diarrhea.   Straining to have a bowel movement.   Sitting for long periods on the toilet.  Heavy lifting or other activity that caused you to strain.  Anal intercourse. SYMPTOMS   Pain.   Anal itching or irritation.   Rectal bleeding.   Fecal leakage.   Anal swelling.   One or more lumps around the anus.  DIAGNOSIS  Your caregiver may be able to diagnose hemorrhoids by visual examination. Other examinations or tests that may be performed include:   Examination of the rectal area with a gloved hand (digital rectal exam).   Examination of anal canal using a small tube (scope).   A blood test if you have lost a significant amount of blood.  A test to look inside the colon (sigmoidoscopy or colonoscopy). TREATMENT Most hemorrhoids can be treated at home. However, if symptoms do not seem to be getting better or if you have a lot of rectal bleeding, your caregiver may perform a procedure to help make the hemorrhoids get smaller or remove them completely.  Possible treatments include:   Placing a rubber band at the base of the hemorrhoid to cut off the circulation (rubber band ligation).   Injecting a chemical to shrink the hemorrhoid (sclerotherapy).   Using a tool to burn the hemorrhoid (infrared light therapy).   Surgically removing the hemorrhoid (hemorrhoidectomy).   Stapling the hemorrhoid to block blood flow to the tissue (hemorrhoid stapling).  HOME CARE INSTRUCTIONS   Eat foods with fiber, such as whole grains, beans, nuts, fruits, and vegetables. Ask your  doctor about taking products with added fiber in them (fibersupplements).  Increase fluid intake. Drink enough water and fluids to keep your urine clear or pale yellow.   Exercise regularly.   Go to the bathroom when you have the urge to have a bowel movement. Do not wait.   Avoid straining to have bowel movements.   Keep the anal area dry and clean. Use wet toilet paper or moist towelettes after a bowel movement.   Medicated creams and suppositories may be used or applied as directed.   Only take over-the-counter or prescription medicines as directed by your caregiver.   Take warm sitz baths for 15-20 minutes, 3-4 times a day to ease pain and discomfort.   Place ice packs on the hemorrhoids if they are tender and swollen. Using ice packs between sitz baths may be helpful.   Put ice in a plastic bag.   Place a towel between your skin and the bag.   Leave the ice on for 15-20 minutes, 3-4 times a day.   Do not use a donut-shaped pillow or sit on the toilet for long periods. This increases blood pooling and pain.  SEEK MEDICAL CARE IF:  You have increasing pain and swelling that is not controlled by treatment or medicine.  You have uncontrolled bleeding.  You have difficulty or you are unable to have a bowel movement.  You have pain or inflammation outside the area of the hemorrhoids. MAKE SURE YOU:  Understand these instructions.  Will watch your condition.  Will get help right away if you are not doing well or get worse.   This information is not intended to replace advice given to you by your health care provider. Make sure you discuss any questions you have with your health care provider.   Document Released: 03/07/2000 Document Revised: 02/25/2012 Document Reviewed: 01/13/2012 Elsevier Interactive Patient Education 2016 [employer]  Nonsurgical Procedures for Hemorrhoids Nonsurgical procedures can be used to treat hemorrhoids. Hemorrhoids are  swollen veins that are inside the rectum (internal hemorrhoids) or around the anus (external hemorrhoids). They are caused by increased pressure in the anal area. This pressure may result from straining to have a bowel movement (constipation), diarrhea, pregnancy, obesity, anal sex, or sitting for long periods of time. Hemorrhoids can cause symptoms such as pain and bleeding. Various procedures may be performed if diet changes, lifestyle changes, and other treatments do not help your symptoms. Some of these procedures do not involve surgery. Three common nonsurgical procedures are:  Rubber band ligation. Rubber bands are used to cut off the blood supply to the hemorrhoids.  Sclerotherapy. Medicine is injected into the hemorrhoids to shrink them.  Infrared coagulation. A type of light energy is used to get rid of the hemorrhoids. LET [facility] CARE PROVIDER KNOW ABOUT:  Any allergies you have.  All medicines you are taking, including vitamins, herbs, eye drops, creams, and over-the-counter medicines.  Previous problems you or members of your family have had  with the use of anesthetics.  Any blood disorders you have.  Previous surgeries you have had.  Any medical conditions you have.  Whether you are pregnant or may be pregnant. RISKS AND COMPLICATIONS Generally, this is a safe procedure. However, problems may occur, including:  Infection.  Bleeding.  Pain. BEFORE THE PROCEDURE  Ask your health care provider about:  Changing or stopping your regular medicines. This is especially important if you are taking diabetes medicines or blood thinners.  Taking medicines such as aspirin and ibuprofen. These medicines can thin your blood. Do not take these medicines before your procedure if your health care provider instructs you not to.  You may need to have a procedure to examine the inside of your colon with a scope (colonoscopy). Your health care provider may do this to make sure that  there are no other causes for your bleeding or pain. PROCEDURE  Your health care provider will clean your rectal area with a rinsing solution.  A lubricating jelly may be placed into your rectum. The jelly may contain a medicine to numb the area (local anesthetic).  Your health care provider will insert a short scope (anoscope) into your rectum to examine the hemorrhoids.  One of the following techniques will be used. Rubber Band Ligation Your health care provider will place medical instruments through the scope to put rubber bands around the base of your hemorrhoids. The bands will cut off the blood supply to the hemorrhoids. The hemorrhoids will fall off after several days. Sclerotherapy Your health care provider will inject medicine through the scope into your hemorrhoids. This will cause them to shrink and dry up. Infrared Coagulation Your health care provider will shine a type of light through the scope onto your hemorrhoids. This light will generate energy (infrared radiation). It will cause the hemorrhoids to scar and then fall off. Each of these procedures may vary among health care providers and hospitals. AFTER THE PROCEDURE  You will be monitored to make sure that you have no bleeding.  Return to your normal activities as told by your health care provider.   This information is not intended to replace advice given to you by your health care provider. Make sure you discuss any questions you have with your health care provider.   Document Released: 01/05/2009 Document Revised: 11/29/2014 Document Reviewed: 06/05/2014 Elsevier Interactive Patient Education 2016 [employer].  Surgical Procedures for Hemorrhoids Surgical procedures can be used to treat hemorrhoids. Hemorrhoids are swollen veins that are inside the rectum (internal hemorrhoids) or around the anus (external hemorrhoids). They are caused by increased pressure in the anal area. This pressure may result from straining  to have a bowel movement (constipation), diarrhea, pregnancy, obesity, anal sex, or sitting for long periods of time. Hemorrhoids can cause symptoms such as pain and bleeding. Surgery may be needed if diet changes, lifestyle changes, and other treatments do not help your symptoms. Various surgical methods may be used. Three common methods are:  Closed hemorrhoidectomy. The hemorrhoids are surgically removed, and the surgical cuts (incisions) are closed with stitches (sutures).  Open hemorrhoidectomy. The hemorrhoids are surgically removed, but the incisions are allowed to heal without sutures.  Stapled hemorrhoidopexy. The hemorrhoids are removed using a device that takes out a ring of excess tissue. LET [facility] CARE PROVIDER KNOW ABOUT:  Any allergies you have.  All medicines you are taking, including vitamins, herbs, eye drops, creams, and over-the-counter medicines.  Previous problems you or members of your family  have had with the use of anesthetics.  Any blood disorders you have.  Previous surgeries you have had.  Any medical conditions you have.  Whether you are pregnant or may be pregnant. RISKS AND COMPLICATIONS Generally, this is a safe procedure. However, problems may occur, including:  Infection.  Bleeding.  Allergic reactions to medicines.  Damage to other structures or organs.  Pain.  Constipation.  Difficulty passing urine.  Narrowing of the anal canal (stenosis).  Difficulty controlling bowel movements (incontinence). BEFORE THE PROCEDURE  Ask your health care provider about:  Changing or stopping your regular medicines. This is especially important if you are taking diabetes medicines or blood thinners.  Taking medicines such as aspirin and ibuprofen. These medicines can thin your blood. Do not take these medicines before your procedure if your health care provider instructs you not to.  You may need to have a procedure to examine the inside of  your colon with a scope (colonoscopy). Your health care provider may do this to make sure that there are no other causes for your bleeding or pain.  Follow instructions from your health care provider about eating or drinking restrictions.  You may be instructed to take a laxative and an enema to clean out your colon before surgery (bowel prep). Carefully follow instructions from your health care provider about bowel prep.  Ask your health care provider how your surgical site will be marked or identified.  You may be given antibiotic medicine to help prevent infection.  Plan to have someone take you home after the procedure. PROCEDURE  To reduce your risk of infection:  Your health care team will wash or sanitize their hands.  Your skin will be washed with soap.  An IV tube will be inserted into one of your veins.  You will be given one or more of the following:  A medicine to help you relax (sedative).  A medicine to numb the area (local anesthetic).  A medicine to make you fall asleep (general anesthetic).  A medicine that is injected into an area of your body to numb everything below the injection site (regional anesthetic).  A lubricating jelly may be placed into your rectum.  Your surgeon will insert a short scope (anoscope) into your rectum to examine the hemorrhoids.  One of the following hemorrhoid procedures will be performed. Closed Hemorrhoidectomy  Your surgeon will use surgical instruments to open the tissue around the hemorrhoids.  The veins that supply the hemorrhoids will be tied off with a suture.  The hemorrhoids will be removed.  The tissue that surrounds the hemorrhoids will be closed with sutures that your body can absorb (absorbable sutures). Open Hemorrhoidectomy  The hemorrhoids will be removed with surgical instruments.  The incisions will be left open to heal without sutures. Stapled Hemorrhoidopexy  Your surgeon will use a circular  stapling device to remove the hemorrhoids.  The device will be inserted into your anus. It will remove a circular ring of tissue that includes hemorrhoid tissue and some tissue above the hemorrhoids.  The staples in the device will close the edges of removed tissue. This will cut off the blood supply to the hemorrhoids and will pull any remaining hemorrhoids back into place. Each of these procedures may vary among health care providers and hospitals. AFTER THE PROCEDURE  Your blood pressure, heart rate, breathing rate, and blood oxygen level will be monitored often until the medicines you were given have worn off.  You will be given  pain medicine as needed.   This information is not intended to replace advice given to you by your health care provider. Make sure you discuss any questions you have with your health care provider.   Document Released: 01/05/2009 Document Revised: 11/29/2014 Document Reviewed: 06/05/2014 Elsevier Interactive Patient Education [employer].

## 2015-05-16 NOTE — ED Provider Notes (Signed)
CSN: [id number]     Arrival date & time 05/16/15  1220 History   First MD Initiated Contact with Patient 05/16/15 1658     Chief Complaint  Patient presents with  . Hemorrhoids      HPI  Patient presents evaluation of hemorrhoid. History of hemorrhoidal banding several years ago. Recurrent symptoms for the last several weeks. Hasn't met now with worsening pain over the last 1 week. Seen in urgent care a few days ago. Referred to the ER, due to long waits he did not stay. Symptoms have gotten better but he has continued pain. He is requesting symptom relief, and referral for "a surgeon".  Past Medical History  Diagnosis Date  . TIA (transient ischemic attack)   . Palpitations   . Hx of echocardiogram     echo 6/15:  EF 60-65%, no RWMA  . Hx of Doppler ultrasound     Carotid US 6/15:  bilat ICA 1-39%  . Hx of cardiovascular stress test     Myoview 8/10:  no ischemia, EF 62%; low risk  . Diverticulosis   . Internal hemorrhoids   . External hemorrhoids   . Tubular adenoma of colon    Past Surgical History  Procedure Laterality Date  . Laparoscopic appendectomy  12/02/2011    Procedure: APPENDECTOMY LAPAROSCOPIC;  Surgeon: Madilyn Hook, DO;  Location: [facility];  Service: General;  Laterality: N/A;  . Appendectomy  12/01/11    lap appy   Family History  Problem Relation Age of Onset  . Hypertension Mother   . Hypertension Father   . Anemia Neg Hx   . Arrhythmia Neg Hx   . Asthma Neg Hx   . Clotting disorder Neg Hx   . Fainting Neg Hx   . Heart attack Neg Hx   . Heart disease Neg Hx   . Heart failure Neg Hx   . Hyperlipidemia Neg Hx   . Colon cancer Neg Hx    Social History  Substance Use Topics  . Smoking status: Never Smoker   . Smokeless tobacco: Never Used  . Alcohol Use: No    Review of Systems  Constitutional: Negative for fever, chills, diaphoresis, appetite change and fatigue.  HENT: Negative for mouth sores, sore throat and trouble swallowing.   Eyes: Negative  for visual disturbance.  Respiratory: Negative for cough, chest tightness, shortness of breath and wheezing.   Cardiovascular: Negative for chest pain.  Gastrointestinal: Negative for nausea, vomiting, abdominal pain, diarrhea and abdominal distention.       Rectal pain. Intermittent bleeding.  Endocrine: Negative for polydipsia, polyphagia and polyuria.  Genitourinary: Negative for dysuria, frequency and hematuria.  Musculoskeletal: Negative for gait problem.  Skin: Negative for color change, pallor and rash.  Neurological: Negative for dizziness, syncope, light-headedness and headaches.  Hematological: Does not bruise/bleed easily.  Psychiatric/Behavioral: Negative for behavioral problems and confusion.      Allergies  Wellbutrin  Home Medications   Prior to Admission medications   Medication Sig Start Date End Date Taking? Authorizing Provider  AMBULATORY NON FORMULARY MEDICATION Medication Name: Nitroglycerin 0.125% ointment Apply to rectal area three times daily for anal fissure Patient not taking: Reported on 01/02/2015 08/08/14   Jerene Bears, MD  aspirin EC 81 MG tablet Take 81 mg by mouth daily.    Historical Provider, MD  docusate sodium (COLACE) 100 MG capsule Take 1 capsule (100 mg total) by mouth every 12 (twelve) hours. 05/16/15   Tanna Furry, MD  HYDROcodone-acetaminophen (NORCO/VICODIN)  5-325 MG tablet Take 2 tablets by mouth every 4 (four) hours as needed. 05/16/15   Tanna Furry, MD  HYDROcodone-acetaminophen (NORCO/VICODIN) 5-325 MG tablet Take 2 tablets by mouth every 4 (four) hours as needed. 05/16/15   Tanna Furry, MD  hydrocortisone (ANUSOL-HC) 2.5 % rectal cream Apply rectally 2 times daily 05/16/15   Tanna Furry, MD  meloxicam (MOBIC) 15 MG tablet Take 1 tablet (15 mg total) by mouth daily. For knee pain Patient not taking: Reported on 08/08/2014 06/17/14   Leandrew Koyanagi, MD  naproxen (NAPROSYN) 500 MG tablet Take 1 tablet (500 mg total) by mouth 2 (two) times daily  as needed for headache. Patient not taking: Reported on 04/22/2015 01/02/15   Davonna Belling, MD  prochlorperazine (COMPAZINE) 10 MG tablet Take 1 tablet (10 mg total) by mouth every 8 (eight) hours as needed (headache). Patient not taking: Reported on 04/22/2015 01/02/15   Davonna Belling, MD   BP 132/71 mmHg  Pulse 68  Temp(Src) 98.2 F (36.8 C) (Oral)  Resp 20  SpO2 100% Physical Exam  Constitutional: He is oriented to person, place, and time. He appears well-developed and well-nourished. No distress.  HENT:  Head: Normocephalic.  Eyes: Conjunctivae are normal. Pupils are equal, round, and reactive to light. No scleral icterus.  Neck: Normal range of motion. Neck supple. No thyromegaly present.  Cardiovascular: Normal rate and regular rhythm.  Exam reveals no gallop and no friction rub.   No murmur heard. Pulmonary/Chest: Effort normal and breath sounds normal. No respiratory distress. He has no wheezes. He has no rales.  Abdominal: Soft. Bowel sounds are normal. He exhibits no distension. There is no tenderness. There is no rebound.  Genitourinary:     Musculoskeletal: Normal range of motion.  Neurological: He is alert and oriented to person, place, and time.  Skin: Skin is warm and dry. No rash noted.  Psychiatric: He has a normal mood and affect. His behavior is normal.    ED Course  Procedures (including critical care time) Labs Review Labs Reviewed - No data to display  Imaging Review No results found. I have personally reviewed and evaluated these images and lab results as part of my medical decision-making.   EKG Interpretation None      MDM   Final diagnoses:  Hemorrhoids, unspecified hemorrhoid type    Vicodin, Colace, Anusol cream, surgical referral.    Tanna Furry, MD 05/16/15 1744

## 2015-05-16 NOTE — ED Notes (Signed)
Pt was discharge by another RN. Pt is gone at this time. Pt was alert and speaking to wife when this RN set eyes on patient. See MD assessment.

## 2015-06-15 ENCOUNTER — Encounter (HOSPITAL_COMMUNITY): Payer: Self-pay | Admitting: Emergency Medicine

## 2015-06-15 ENCOUNTER — Emergency Department (HOSPITAL_COMMUNITY)
Admission: EM | Admit: 2015-06-15 | Discharge: 2015-06-16 | Disposition: A | Discharge status: Home / Self Care | Payer: BLUE CROSS/BLUE SHIELD | Attending: Emergency Medicine | Admitting: Emergency Medicine

## 2015-06-15 DIAGNOSIS — Z86018 Personal history of other benign neoplasm: Secondary | ICD-10-CM | POA: Insufficient documentation

## 2015-06-15 DIAGNOSIS — H5340 Unspecified visual field defects: Secondary | ICD-10-CM | POA: Insufficient documentation

## 2015-06-15 DIAGNOSIS — Z8719 Personal history of other diseases of the digestive system: Secondary | ICD-10-CM | POA: Diagnosis not present

## 2015-06-15 DIAGNOSIS — Z8673 Personal history of transient ischemic attack (TIA), and cerebral infarction without residual deficits: Secondary | ICD-10-CM | POA: Insufficient documentation

## 2015-06-15 DIAGNOSIS — H539 Unspecified visual disturbance: Secondary | ICD-10-CM | POA: Diagnosis present

## 2015-06-15 LAB — CBC WITH DIFFERENTIAL/PLATELET
Basophils Absolute: 0 10*3/uL (ref 0.0–0.1)
Basophils Relative: 0 %
Eosinophils Absolute: 0.1 10*3/uL (ref 0.0–0.7)
Eosinophils Relative: 2 %
HCT: 39.9 % (ref 39.0–52.0)
Hemoglobin: 13.5 g/dL (ref 13.0–17.0)
Lymphocytes Relative: 33 %
Lymphs Abs: 1.9 10*3/uL (ref 0.7–4.0)
MCH: 29.2 pg (ref 26.0–34.0)
MCHC: 33.8 g/dL (ref 30.0–36.0)
MCV: 86.2 fL (ref 78.0–100.0)
Monocytes Absolute: 0.4 10*3/uL (ref 0.1–1.0)
Monocytes Relative: 6 %
Neutro Abs: 3.4 10*3/uL (ref 1.7–7.7)
Neutrophils Relative %: 59 %
Platelets: 203 10*3/uL (ref 150–400)
RBC: 4.63 MIL/uL (ref 4.22–5.81)
RDW: 12.3 % (ref 11.5–15.5)
WBC: 5.9 10*3/uL (ref 4.0–10.5)

## 2015-06-15 NOTE — ED Notes (Signed)
Patient presents for "seeing white spots", states episodes lasted approximately 40 minutes, vision currently has resolved. Denies all c/c currently. Denies weakness, decreased sensation, abnormal gait, no drift, no double/blurred vision, no lightheadedness or dizziness.

## 2015-06-16 ENCOUNTER — Emergency Department (HOSPITAL_COMMUNITY): Payer: BLUE CROSS/BLUE SHIELD

## 2015-06-16 LAB — COMPREHENSIVE METABOLIC PANEL
ALT: 22 U/L (ref 17–63)
AST: 24 U/L (ref 15–41)
Albumin: 4.4 g/dL (ref 3.5–5.0)
Alkaline Phosphatase: 50 U/L (ref 38–126)
Anion gap: 7 (ref 5–15)
BUN: 20 mg/dL (ref 6–20)
CO2: 27 mmol/L (ref 22–32)
Calcium: 9.1 mg/dL (ref 8.9–10.3)
Chloride: 109 mmol/L (ref 101–111)
Creatinine, Ser: 0.87 mg/dL (ref 0.61–1.24)
GFR calc Af Amer: >60 mL/min (ref >60)
GFR calc non Af Amer: >60 mL/min (ref >60)
Glucose, Bld: 100 mg/dL — ABNORMAL HIGH (ref 65–99)
Potassium: 4.5 mmol/L (ref 3.5–5.1)
Sodium: 143 mmol/L (ref 135–145)
Total Bilirubin: 0.7 mg/dL (ref 0.3–1.2)
Total Protein: 7.4 g/dL (ref 6.5–8.1)

## 2015-06-16 LAB — I-STAT TROPONIN, ED: Troponin i, poc: 0 ng/mL (ref 0.00–0.08)

## 2015-06-16 LAB — I-STAT CHEM 8, ED
BUN: 22 mg/dL — ABNORMAL HIGH (ref 6–20)
Calcium, Ion: 1.14 mmol/L (ref 1.12–1.23)
Chloride: 105 mmol/L (ref 101–111)
Creatinine, Ser: 0.9 mg/dL (ref 0.61–1.24)
Glucose, Bld: 96 mg/dL (ref 65–99)
HCT: 44 % (ref 39.0–52.0)
Hemoglobin: 15 g/dL (ref 13.0–17.0)
Potassium: 4.5 mmol/L (ref 3.5–5.1)
Sodium: 141 mmol/L (ref 135–145)
TCO2: 26 mmol/L (ref 0–100)

## 2015-06-16 LAB — URINALYSIS, ROUTINE W REFLEX MICROSCOPIC
Bilirubin Urine: NEGATIVE
Glucose, UA: NEGATIVE mg/dL
Hgb urine dipstick: NEGATIVE
Ketones, ur: NEGATIVE mg/dL
Leukocytes, UA: NEGATIVE
Nitrite: NEGATIVE
Protein, ur: NEGATIVE mg/dL
Specific Gravity, Urine: 1.027 (ref 1.005–1.030)
pH: 6 (ref 5.0–8.0)

## 2015-06-16 LAB — RAPID URINE DRUG SCREEN, HOSP PERFORMED
Amphetamines: NOT DETECTED
Barbiturates: NOT DETECTED
Benzodiazepines: NOT DETECTED
Cocaine: NOT DETECTED
Opiates: NOT DETECTED
Tetrahydrocannabinol: NOT DETECTED

## 2015-06-16 LAB — ETHANOL: Alcohol, Ethyl (B): <5 mg/dL (ref <5)

## 2015-06-16 LAB — PROTIME-INR
INR: 1.09 (ref 0.00–1.49)
Prothrombin Time: 13.9 seconds (ref 11.6–15.2)

## 2015-06-16 LAB — APTT: aPTT: 32 seconds (ref 24–37)

## 2015-06-16 MED ORDER — ASPIRIN 81 MG PO CHEW
324.0000 mg | CHEWABLE_TABLET | Freq: Once | ORAL | Status: AC
  Administered 2015-06-16: 324 mg via ORAL
  Filled 2015-06-16: qty 4

## 2015-06-16 NOTE — ED Provider Notes (Signed)
CSN: [id number]     Arrival date & time 06/15/15  2214 History   First MD Initiated Contact with Patient 06/16/15 0202     Chief Complaint  Patient presents with  . Visual Field Change     (Consider location/radiation/quality/duration/timing/severity/associated sxs/prior Treatment) HPI Comments: Patient states that while he was preparing dinner.  He had peripheral visual field loss and he was seen white spots.  This lasted approximately 45 minutes, he also had photophobia at the time not associated with dizziness, weakness of extremities, difficulty swallowing or speaking.  He's had multiple episodes of this in the past.  Initially he was told he had TIAs was hospitalized for 3 days.  Was told to take an aspirin never followed up with outpatient neurology.  Patient is a 55 y.o. male presenting with eye problem. The history is provided by the patient.  Eye Problem Location:  Both Quality: Seeing white spots, peripheral visual field loss. Severity:  Moderate Onset quality:  Sudden Duration:  45 minutes Timing:  Intermittent Chronicity:  Recurrent Relieved by:  Nothing Worsened by:  Nothing tried Ineffective treatments:  None tried Associated symptoms: photophobia   Associated symptoms: no headaches and no nausea     Past Medical History  Diagnosis Date  . TIA (transient ischemic attack)   . Palpitations   . Hx of echocardiogram     echo 6/15:  EF 60-65%, no RWMA  . Hx of Doppler ultrasound     Carotid US 6/15:  bilat ICA 1-39%  . Hx of cardiovascular stress test     Myoview 8/10:  no ischemia, EF 62%; low risk  . Diverticulosis   . Internal hemorrhoids   . External hemorrhoids   . Tubular adenoma of colon    Past Surgical History  Procedure Laterality Date  . Laparoscopic appendectomy  12/02/2011    Procedure: APPENDECTOMY LAPAROSCOPIC;  Surgeon: Madilyn Hook, DO;  Location: [facility];  Service: General;  Laterality: N/A;  . Appendectomy  12/01/11    lap appy   Family  History  Problem Relation Age of Onset  . Hypertension Mother   . Hypertension Father   . Anemia Neg Hx   . Arrhythmia Neg Hx   . Asthma Neg Hx   . Clotting disorder Neg Hx   . Fainting Neg Hx   . Heart attack Neg Hx   . Heart disease Neg Hx   . Heart failure Neg Hx   . Hyperlipidemia Neg Hx   . Colon cancer Neg Hx    Social History  Substance Use Topics  . Smoking status: Never Smoker   . Smokeless tobacco: Never Used  . Alcohol Use: No    Review of Systems  Constitutional: Negative for activity change and appetite change.  Eyes: Positive for photophobia and visual disturbance. Negative for pain.  Gastrointestinal: Negative for nausea.  Neurological: Negative for dizziness and headaches.  All other systems reviewed and are negative.     Allergies  Wellbutrin  Home Medications   Prior to Admission medications   Medication Sig Start Date End Date Taking? Authorizing Provider  AMBULATORY NON FORMULARY MEDICATION Medication Name: Nitroglycerin 0.125% ointment Apply to rectal area three times daily for anal fissure Patient not taking: Reported on 01/02/2015 08/08/14   Jerene Bears, MD  docusate sodium (COLACE) 100 MG capsule Take 1 capsule (100 mg total) by mouth every 12 (twelve) hours. Patient not taking: Reported on 06/15/2015 05/16/15   Tanna Furry, MD  HYDROcodone-acetaminophen (NORCO/VICODIN) 5-325 MG  tablet Take 2 tablets by mouth every 4 (four) hours as needed. Patient not taking: Reported on 06/15/2015 05/16/15   Tanna Furry, MD  hydrocortisone (ANUSOL-HC) 2.5 % rectal cream Apply rectally 2 times daily Patient not taking: Reported on 06/15/2015 05/16/15   Tanna Furry, MD  naproxen (NAPROSYN) 500 MG tablet Take 1 tablet (500 mg total) by mouth 2 (two) times daily as needed for headache. Patient not taking: Reported on 04/22/2015 01/02/15   Davonna Belling, MD  prochlorperazine (COMPAZINE) 10 MG tablet Take 1 tablet (10 mg total) by mouth every 8 (eight) hours as needed  (headache). Patient not taking: Reported on 04/22/2015 01/02/15   Davonna Belling, MD   BP 116/66 mmHg  Pulse 63  Temp(Src) 97.7 F (36.5 C) (Oral)  Resp 20  SpO2 99% Physical Exam  Constitutional: He is oriented to person, place, and time. He appears well-developed and well-nourished.  HENT:  Head: Normocephalic.  Right Ear: External ear normal.  Left Ear: External ear normal.  Mouth/Throat: Oropharynx is clear and moist.  Eyes: Pupils are equal, round, and reactive to light.  Neck: Normal range of motion.  Cardiovascular: Normal rate and regular rhythm.   Pulmonary/Chest: Effort normal.  Abdominal: Soft.  Musculoskeletal: Normal range of motion.  Neurological: He is alert and oriented to person, place, and time.  Skin: Skin is warm and dry.  Nursing note and vitals reviewed.   ED Course  Procedures (including critical care time) Labs Review Labs Reviewed  COMPREHENSIVE METABOLIC PANEL - Abnormal; Notable for the following:    Glucose, Bld 100 (*)    All other components within normal limits  I-STAT CHEM 8, ED - Abnormal; Notable for the following:    BUN 22 (*)    All other components within normal limits  CBC WITH DIFFERENTIAL/PLATELET  ETHANOL  PROTIME-INR  APTT  URINE RAPID DRUG SCREEN, HOSP PERFORMED  URINALYSIS, ROUTINE W REFLEX MICROSCOPIC (NOT AT [facility])  I-STAT TROPOININ, ED    Imaging Review No results found. I have personally reviewed and evaluated these images and lab results as part of my medical decision-making.   EKG Interpretation None     We'll repeat CT scan.  This could be TIA. I spoke with Dr. Forde Radon, neurology recommends MRI.  If there is new small lesion he can be discharged home, told to take aspirin and follow-up outpatient with neurology if there is no lesion.  Patient can be discharged MDM   Final diagnoses:  None         Junius Creamer, NP 06/16/15 [id number]  Virgel Manifold, MD 06/19/15 1050

## 2015-10-19 ENCOUNTER — Emergency Department (HOSPITAL_COMMUNITY)
Admission: EM | Admit: 2015-10-19 | Discharge: 2015-10-19 | Disposition: A | Discharge status: Home / Self Care | Payer: BLUE CROSS/BLUE SHIELD | Attending: Emergency Medicine | Admitting: Emergency Medicine

## 2015-10-19 ENCOUNTER — Encounter (HOSPITAL_COMMUNITY): Payer: Self-pay | Admitting: Emergency Medicine

## 2015-10-19 DIAGNOSIS — H9201 Otalgia, right ear: Secondary | ICD-10-CM | POA: Diagnosis present

## 2015-10-19 DIAGNOSIS — Z8673 Personal history of transient ischemic attack (TIA), and cerebral infarction without residual deficits: Secondary | ICD-10-CM | POA: Insufficient documentation

## 2015-10-19 DIAGNOSIS — H8101 Meniere's disease, right ear: Secondary | ICD-10-CM | POA: Diagnosis not present

## 2015-10-19 MED ORDER — AMOXICILLIN 500 MG PO CAPS: 500.0000 mg | ORAL_CAPSULE | Freq: Three times a day (TID) | ORAL | 0 refills | Status: DC

## 2015-10-19 MED ORDER — PREDNISONE 20 MG PO TABS: 40.0000 mg | ORAL_TABLET | Freq: Every day | ORAL | 0 refills | Status: DC

## 2015-10-19 NOTE — ED Notes (Signed)
Pt ambulatory to room from waiting room; steady gait noted

## 2015-10-19 NOTE — ED Provider Notes (Signed)
[facility]-EMERGENCY DEPT Provider Note   CSN: [id number] Arrival date & time: 10/19/15  0511  First Provider Contact:  None       History   Chief Complaint Chief Complaint  Patient presents with  . Otalgia    HPI Todd [name] is a 55 y.o. male.  Patient presents to the emergency department for evaluation of bilateral ear pain. Patient reports that several hours ago he had sudden onset of pain in both of those years with a buzzing sound. He feels off balance when he walks. He is hearing an echo when he talks and feels like his hearing is slightly diminished. He reports that he normally cannot hear out of his left ear, but the right ear is now decreased as well. He has not had any sore throat, nasal congestion or drainage. There is no drainage from the ears. He denies trauma to the ears.    Otalgia  Associated symptoms include hearing loss.    Past Medical History:  Diagnosis Date  . Diverticulosis   . External hemorrhoids   . Hx of cardiovascular stress test    Myoview 8/10:  no ischemia, EF 62%; low risk  . Hx of Doppler ultrasound    Carotid US 6/15:  bilat ICA 1-39%  . Hx of echocardiogram    echo 6/15:  EF 60-65%, no RWMA  . Internal hemorrhoids   . Palpitations   . TIA (transient ischemic attack)   . Tubular adenoma of colon     Patient Active Problem List   Diagnosis Date Noted  . Palpitations 10/20/2013  . Situational anxiety 10/20/2013  . Migraine with status migrainosus 08/24/2013  . TIA (transient ischemic attack) 08/23/2013  . Blurred vision 08/23/2013  . Anomia 08/23/2013    Past Surgical History:  Procedure Laterality Date  . APPENDECTOMY  12/01/11   lap appy  . LAPAROSCOPIC APPENDECTOMY  12/02/2011   Procedure: APPENDECTOMY LAPAROSCOPIC;  Surgeon: Lodema Pilot, DO;  Location: [facility];  Service: General;  Laterality: N/A;       Home Medications    Prior to Admission medications   Medication Sig Start Date End Date Taking? Authorizing Provider    AMBULATORY NON FORMULARY MEDICATION Medication Name: Nitroglycerin 0.125% ointment Apply to rectal area three times daily for anal fissure Patient not taking: Reported on 01/02/2015 08/08/14   Beverley Fiedler, MD  docusate sodium (COLACE) 100 MG capsule Take 1 capsule (100 mg total) by mouth every 12 (twelve) hours. Patient not taking: Reported on 06/15/2015 05/16/15   Rolland Porter, MD  HYDROcodone-acetaminophen (NORCO/VICODIN) 5-325 MG tablet Take 2 tablets by mouth every 4 (four) hours as needed. Patient not taking: Reported on 06/15/2015 05/16/15   Rolland Porter, MD  hydrocortisone (ANUSOL-HC) 2.5 % rectal cream Apply rectally 2 times daily Patient not taking: Reported on 06/15/2015 05/16/15   Rolland Porter, MD  naproxen (NAPROSYN) 500 MG tablet Take 1 tablet (500 mg total) by mouth 2 (two) times daily as needed for headache. Patient not taking: Reported on 04/22/2015 01/02/15   Benjiman Core, MD  prochlorperazine (COMPAZINE) 10 MG tablet Take 1 tablet (10 mg total) by mouth every 8 (eight) hours as needed (headache). Patient not taking: Reported on 04/22/2015 01/02/15   Benjiman Core, MD    Family History Family History  Problem Relation Age of Onset  . Hypertension Mother   . Hypertension Father   . Anemia Neg Hx   . Arrhythmia Neg Hx   . Asthma Neg Hx   . Clotting  disorder Neg Hx   . Fainting Neg Hx   . Heart attack Neg Hx   . Heart disease Neg Hx   . Heart failure Neg Hx   . Hyperlipidemia Neg Hx   . Colon cancer Neg Hx     Social History Social History  Substance Use Topics  . Smoking status: Never Smoker  . Smokeless tobacco: Never Used  . Alcohol use No     Allergies   Wellbutrin [bupropion hcl]   Review of Systems Review of Systems  HENT: Positive for ear pain, hearing loss and tinnitus.   All other systems reviewed and are negative.    Physical Exam Updated Vital Signs BP 142/90   Pulse 89   Temp 97.8 F (36.6 C) (Oral)   SpO2 100%   Physical Exam   Constitutional: He is oriented to person, place, and time. He appears well-developed and well-nourished. No distress.  HENT:  Head: Normocephalic and atraumatic.  Right Ear: Hearing and ear canal normal. A middle ear effusion is present.  Left Ear: Hearing, tympanic membrane and ear canal normal.  Nose: Nose normal.  Mouth/Throat: Oropharynx is clear and moist and mucous membranes are normal.  Eyes: Conjunctivae and EOM are normal. Pupils are equal, round, and reactive to light.  Neck: Normal range of motion. Neck supple.  Cardiovascular: Regular rhythm, S1 normal and S2 normal.  Exam reveals no gallop and no friction rub.   No murmur heard. Pulmonary/Chest: Effort normal and breath sounds normal. No respiratory distress. He exhibits no tenderness.  Abdominal: Soft. Normal appearance and bowel sounds are normal. There is no hepatosplenomegaly. There is no tenderness. There is no rebound, no guarding, no tenderness at McBurney's point and negative Murphy's sign. No hernia.  Musculoskeletal: Normal range of motion.  Neurological: He is alert and oriented to person, place, and time. He has normal strength. No cranial nerve deficit or sensory deficit. Coordination normal. GCS eye subscore is 4. GCS verbal subscore is 5. GCS motor subscore is 6.  Skin: Skin is warm, dry and intact. No rash noted. No cyanosis.  Psychiatric: He has a normal mood and affect. His speech is normal and behavior is normal. Thought content normal.  Nursing note and vitals reviewed.    ED Treatments / Results  Labs (all labs ordered are listed, but only abnormal results are displayed) Labs Reviewed - No data to display  EKG  EKG Interpretation None       Radiology No results found.  Procedures Procedures (including critical care time)  Medications Ordered in ED Medications - No data to display   Initial Impression / Assessment and Plan / ED Course  I have reviewed the triage vital signs and the  nursing notes.  Pertinent labs & imaging results that were available during my care of the patient were reviewed by me and considered in my medical decision making (see chart for details).  Clinical Course    Patient presents to the emergency department for evaluation of bilateral ear pain, decreased hearing in the right ear with tinnitus. This is concerning for possible Mnire's disease. Patient will be referred to ENT. Additionally, however, he does have a small effusion behind the right tympanic membrane. Will treat for infection and short course of prednisone.  Final Clinical Impressions(s) / ED Diagnoses   Final diagnoses:  None    New Prescriptions New Prescriptions   No medications on file     Orpah Greek, MD 10/19/15 [phone]

## 2015-10-19 NOTE — ED Triage Notes (Addendum)
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

## 2015-10-27 ENCOUNTER — Emergency Department (HOSPITAL_COMMUNITY)
Admission: EM | Admit: 2015-10-27 | Discharge: 2015-10-28 | Disposition: A | Discharge status: Home / Self Care | Payer: BLUE CROSS/BLUE SHIELD | Attending: Emergency Medicine | Admitting: Emergency Medicine

## 2015-10-27 ENCOUNTER — Emergency Department (HOSPITAL_COMMUNITY): Payer: BLUE CROSS/BLUE SHIELD

## 2015-10-27 DIAGNOSIS — G43809 Other migraine, not intractable, without status migrainosus: Secondary | ICD-10-CM | POA: Diagnosis not present

## 2015-10-27 DIAGNOSIS — G43909 Migraine, unspecified, not intractable, without status migrainosus: Secondary | ICD-10-CM | POA: Diagnosis present

## 2015-10-27 DIAGNOSIS — Z79899 Other long term (current) drug therapy: Secondary | ICD-10-CM | POA: Diagnosis not present

## 2015-10-27 DIAGNOSIS — Z8673 Personal history of transient ischemic attack (TIA), and cerebral infarction without residual deficits: Secondary | ICD-10-CM | POA: Diagnosis not present

## 2015-10-27 DIAGNOSIS — H539 Unspecified visual disturbance: Secondary | ICD-10-CM

## 2015-10-27 DIAGNOSIS — G43109 Migraine with aura, not intractable, without status migrainosus: Secondary | ICD-10-CM | POA: Diagnosis not present

## 2015-10-27 DIAGNOSIS — I639 Cerebral infarction, unspecified: Secondary | ICD-10-CM | POA: Diagnosis not present

## 2015-10-27 LAB — COMPREHENSIVE METABOLIC PANEL
ALT: 26 U/L (ref 17–63)
AST: 23 U/L (ref 15–41)
Albumin: 3.9 g/dL (ref 3.5–5.0)
Alkaline Phosphatase: 44 U/L (ref 38–126)
Anion gap: 6 (ref 5–15)
BUN: 15 mg/dL (ref 6–20)
CO2: 25 mmol/L (ref 22–32)
Calcium: 8.9 mg/dL (ref 8.9–10.3)
Chloride: 107 mmol/L (ref 101–111)
Creatinine, Ser: 1 mg/dL (ref 0.61–1.24)
GFR calc Af Amer: >60 mL/min (ref >60)
GFR calc non Af Amer: >60 mL/min (ref >60)
Glucose, Bld: 94 mg/dL (ref 65–99)
Potassium: 4.1 mmol/L (ref 3.5–5.1)
Sodium: 138 mmol/L (ref 135–145)
Total Bilirubin: 1.2 mg/dL (ref 0.3–1.2)
Total Protein: 6.5 g/dL (ref 6.5–8.1)

## 2015-10-27 LAB — CBC
HCT: 42.4 % (ref 39.0–52.0)
Hemoglobin: 14.2 g/dL (ref 13.0–17.0)
MCH: 29.8 pg (ref 26.0–34.0)
MCHC: 33.5 g/dL (ref 30.0–36.0)
MCV: 89.1 fL (ref 78.0–100.0)
Platelets: 204 10*3/uL (ref 150–400)
RBC: 4.76 MIL/uL (ref 4.22–5.81)
RDW: 12.6 % (ref 11.5–15.5)
WBC: 8.2 10*3/uL (ref 4.0–10.5)

## 2015-10-27 LAB — RAPID URINE DRUG SCREEN, HOSP PERFORMED
Amphetamines: NOT DETECTED
Barbiturates: NOT DETECTED
Benzodiazepines: NOT DETECTED
Cocaine: NOT DETECTED
Opiates: NOT DETECTED
Tetrahydrocannabinol: NOT DETECTED

## 2015-10-27 LAB — URINALYSIS, ROUTINE W REFLEX MICROSCOPIC
Bilirubin Urine: NEGATIVE
Glucose, UA: NEGATIVE mg/dL
Hgb urine dipstick: NEGATIVE
Ketones, ur: NEGATIVE mg/dL
Leukocytes, UA: NEGATIVE
Nitrite: NEGATIVE
Protein, ur: NEGATIVE mg/dL
Specific Gravity, Urine: 1.034 — ABNORMAL HIGH (ref 1.005–1.030)
pH: 5.5 (ref 5.0–8.0)

## 2015-10-27 LAB — I-STAT CHEM 8, ED
BUN: 18 mg/dL (ref 6–20)
Calcium, Ion: 1.07 mmol/L — ABNORMAL LOW (ref 1.13–1.30)
Chloride: 104 mmol/L (ref 101–111)
Creatinine, Ser: 1 mg/dL (ref 0.61–1.24)
Glucose, Bld: 90 mg/dL (ref 65–99)
HCT: 41 % (ref 39.0–52.0)
Hemoglobin: 13.9 g/dL (ref 13.0–17.0)
Potassium: 4.1 mmol/L (ref 3.5–5.1)
Sodium: 140 mmol/L (ref 135–145)
TCO2: 25 mmol/L (ref 0–100)

## 2015-10-27 LAB — DIFFERENTIAL
Basophils Absolute: 0 10*3/uL (ref 0.0–0.1)
Basophils Relative: 1 %
Eosinophils Absolute: 0.2 10*3/uL (ref 0.0–0.7)
Eosinophils Relative: 2 %
Lymphocytes Relative: 34 %
Lymphs Abs: 2.7 10*3/uL (ref 0.7–4.0)
Monocytes Absolute: 0.5 10*3/uL (ref 0.1–1.0)
Monocytes Relative: 6 %
Neutro Abs: 4.7 10*3/uL (ref 1.7–7.7)
Neutrophils Relative %: 57 %

## 2015-10-27 LAB — CBG MONITORING, ED: Glucose-Capillary: 96 mg/dL (ref 65–99)

## 2015-10-27 LAB — PROTIME-INR
INR: 1.06
Prothrombin Time: 13.8 seconds (ref 11.4–15.2)

## 2015-10-27 LAB — ETHANOL: Alcohol, Ethyl (B): <5 mg/dL (ref <5)

## 2015-10-27 LAB — APTT: aPTT: 29 seconds (ref 24–36)

## 2015-10-27 LAB — I-STAT TROPONIN, ED: Troponin i, poc: 0 ng/mL (ref 0.00–0.08)

## 2015-10-27 MED ORDER — PROCHLORPERAZINE EDISYLATE 5 MG/ML IJ SOLN
10.0000 mg | Freq: Once | INTRAMUSCULAR | Status: AC
Start: 2015-10-27 — End: 2015-10-27
  Administered 2015-10-27: 10 mg via INTRAVENOUS
  Filled 2015-10-27: qty 2

## 2015-10-27 MED ORDER — KETOROLAC TROMETHAMINE 30 MG/ML IJ SOLN
30.0000 mg | Freq: Once | INTRAMUSCULAR | Status: AC
  Administered 2015-10-27: 30 mg via INTRAVENOUS
  Filled 2015-10-27: qty 1

## 2015-10-27 MED ORDER — SODIUM CHLORIDE 0.9 % IV BOLUS (SEPSIS)
1000.0000 mL | Freq: Once | INTRAVENOUS | Status: AC
  Administered 2015-10-27: 1000 mL via INTRAVENOUS

## 2015-10-27 MED ORDER — DIPHENHYDRAMINE HCL 50 MG/ML IJ SOLN
25.0000 mg | Freq: Once | INTRAMUSCULAR | Status: AC
  Administered 2015-10-27: 25 mg via INTRAVENOUS
  Filled 2015-10-27: qty 1

## 2015-10-27 NOTE — Progress Notes (Signed)
Patient is a 55 year old male, come to ED with seeing white spots and left side blurred vision, left side headache and left side numbness. Pt states his symptoms subside as quickly as they come on other than headache which is constant. Pt verbalizes a hx of migraines and TIA 2 years ago. Pt to CT at 2050. NIHSS 0. CBG 94. Kirkpatrick at bedside. Code cancelled at 2107. Pt to be worked up for migraines and discharged home.

## 2015-10-27 NOTE — Consult Note (Signed)
Neurology Consultation Reason for Consult: Vision changes Referring Physician: Stark Jock, D  CC: Vision changes  History is obtained from: Patient  HPI: Todd [name] is a 55 y.o. male who presents with vision changes and started 8:20 p.m. He states that he has had this multiple times before always associated with a photophobic headache that follows the initial symptom. He did not use to get migraines when he was younger. He had a workup with the suspicion that it was a TIA several years ago, but that since then he states that every few months it occurs, the most recent being in March of this year. He also states that it was associated with numbness, but not tingling, and he has noticed this with all of his previous episodes as well. He sometimes has confusion with these events also.  There was a question of occipital infarct one of his previous MRIs associated with one of these events, and he also had an event of right ear decreased hearing and unsteadiness which, though most likely unrelated to anything intracerebral, could be an AICA distribution problem.  LKW: 20:20 tpa given?: no, mild symptoms    ROS: A 14 point ROS was performed and is negative except as noted in the HPI.   Past Medical History:  Diagnosis Date  . Diverticulosis   . External hemorrhoids   . Hx of cardiovascular stress test    Myoview 8/10:  no ischemia, EF 62%; low risk  . Hx of Doppler ultrasound    Carotid US 6/15:  bilat ICA 1-39%  . Hx of echocardiogram    echo 6/15:  EF 60-65%, no RWMA  . Internal hemorrhoids   . Palpitations   . TIA (transient ischemic attack)   . Tubular adenoma of colon      Family History  Problem Relation Age of Onset  . Hypertension Mother   . Hypertension Father   . Anemia Neg Hx   . Arrhythmia Neg Hx   . Asthma Neg Hx   . Clotting disorder Neg Hx   . Fainting Neg Hx   . Heart attack Neg Hx   . Heart disease Neg Hx   . Heart failure Neg Hx   . Hyperlipidemia Neg Hx   .  Colon cancer Neg Hx      Social History:  reports that he has never smoked. He has never used smokeless tobacco. He reports that he does not drink alcohol or use drugs.   Exam: Current vital signs: BP 138/86 (BP Location: Left Arm)   Pulse 95   Temp 99.2 F (37.3 C)   Resp 16   Ht 5\' 7"  (1.702 m)   Wt 77.7 kg (171 lb 6 oz)   SpO2 100%   BMI 26.84 kg/m  Vital signs in last 24 hours: Temp:  [99.2 F (37.3 C)] 99.2 F (37.3 C) (08/05 2124) Pulse Rate:  [92-95] 95 (08/05 2034) Resp:  [16] 16 (08/05 2034) BP: (134-138)/(86-89) 138/86 (08/05 2034) SpO2:  [100 %] 100 % (08/05 2034) Weight:  [77.7 kg (171 lb 6 oz)] 77.7 kg (171 lb 6 oz) (08/05 2032)   Physical Exam  Constitutional: Appears well-developed and well-nourished.  Psych: Affect appropriate to situation Eyes: No scleral injection HENT: No OP obstrucion Head: Normocephalic.  Cardiovascular: Normal rate and regular rhythm.  Respiratory: Effort normal and breath sounds normal to anterior ascultation GI: Soft.  No distension. There is no tenderness.  Skin: WDI  Neuro: Mental Status: Patient is awake, alert, oriented to  person, place, month, year, and situation. Patient is able to give a clear and coherent history. No signs of aphasia or neglect Cranial Nerves: II: Visual Fields are full. Pupils are equal, round, and reactive to light.   III,IV, VI: EOMI without ptosis or diploplia.  V: Facial sensation is symmetric to temperature VII: Facial movement is symmetric.  VIII: hearing is intact to voice X: Uvula elevates symmetrically XI: Shoulder shrug is symmetric. XII: tongue is midline without atrophy or fasciculations.  Motor: Tone is normal. Bulk is normal. 5/5 strength was present in all four extremities.  Sensory: Sensation is symmetric to light touch and temperature in the arms and legs. Cerebellar: FNF and HKS are intact bilaterally      I have reviewed labs in epic and the results pertinent to this  consultation are: Chem 8-unremarkable  I have reviewed the images obtained: CT head- no acute findings  Impression: 55 year old male with what is very much most likely to be a complicated migraine. He does have enough and his history, however, I think to warrant further investigation with an MRI. If this is negative, then I would not pursue any further workup but would treat as migraine.  Recommendations: 1) MRI brain 2) Compazine, Benadryl, Toradol for migraine 3) further workup only if MRI is positive.   Roland Rack, MD [facility] [phone]  If 7pm- 7am, please page neurology on call as listed in [place].

## 2015-10-27 NOTE — ED Triage Notes (Signed)
Pt st's approx 20 prior to arrival to ED he started having white spots in his vision and also blurry vision in left eye.  Pt st's he was dx with a TIA approx 1 1/2 yrs ago.  Pt alert and oriented x's 3.  No weakness, numbness or tingling.  Dr. Stark Jock made aware and in triage to assess pt.  Pt told Dr. Stark Jock that symptoms have resolved at this time.  However pt does c/o slight pain in back of head.   Code Stroke called per Dr. Stark Jock.  Enroute to CT pt started to c/o numbness in left side of body.  Once in CT pt st's symptoms have resolved.

## 2015-10-27 NOTE — ED Provider Notes (Signed)
[facility] DEPT Provider Note   CSN: [id number] Arrival date & time: 10/27/15  2027  First Provider Contact:  None       History   Chief Complaint Chief Complaint  Patient presents with  . Code Stroke    HPI Todd [name] is a 55 y.o. male.  Patient is a 55 year old male with past medical history of migraine headaches. He presents today with complaints of headache and blurry vision that started 20 minutes prior to arrival. He reports seeing white spots in the visual fields of both eyes. This was followed by nausea and headache. This is happened in the past, and felt to be related to migraines. He denies any injury from a. He denies any fevers or chills.   The history is provided by the patient.    Past Medical History:  Diagnosis Date  . Diverticulosis   . External hemorrhoids   . Hx of cardiovascular stress test    Myoview 8/10:  no ischemia, EF 62%; low risk  . Hx of Doppler ultrasound    Carotid US 6/15:  bilat ICA 1-39%  . Hx of echocardiogram    echo 6/15:  EF 60-65%, no RWMA  . Internal hemorrhoids   . Palpitations   . TIA (transient ischemic attack)   . Tubular adenoma of colon     Patient Active Problem List   Diagnosis Date Noted  . Palpitations 10/20/2013  . Situational anxiety 10/20/2013  . Migraine with status migrainosus 08/24/2013  . TIA (transient ischemic attack) 08/23/2013  . Blurred vision 08/23/2013  . Anomia 08/23/2013    Past Surgical History:  Procedure Laterality Date  . APPENDECTOMY  12/01/11   lap appy  . LAPAROSCOPIC APPENDECTOMY  12/02/2011   Procedure: APPENDECTOMY LAPAROSCOPIC;  Surgeon: Madilyn Hook, DO;  Location: [facility];  Service: General;  Laterality: N/A;       Home Medications    Prior to Admission medications   Medication Sig Start Date End Date Taking? Authorizing Provider  AMBULATORY NON FORMULARY MEDICATION Medication Name: Nitroglycerin 0.125% ointment Apply to rectal area three times daily for anal  fissure Patient not taking: Reported on 01/02/2015 08/08/14   Jerene Bears, MD  amoxicillin (AMOXIL) 500 MG capsule Take 1 capsule (500 mg total) by mouth 3 (three) times daily. 10/19/15   Orpah Greek, MD  docusate sodium (COLACE) 100 MG capsule Take 1 capsule (100 mg total) by mouth every 12 (twelve) hours. Patient not taking: Reported on 06/15/2015 05/16/15   Tanna Furry, MD  HYDROcodone-acetaminophen (NORCO/VICODIN) 5-325 MG tablet Take 2 tablets by mouth every 4 (four) hours as needed. Patient not taking: Reported on 06/15/2015 05/16/15   Tanna Furry, MD  hydrocortisone (ANUSOL-HC) 2.5 % rectal cream Apply rectally 2 times daily Patient not taking: Reported on 06/15/2015 05/16/15   Tanna Furry, MD  naproxen (NAPROSYN) 500 MG tablet Take 1 tablet (500 mg total) by mouth 2 (two) times daily as needed for headache. Patient not taking: Reported on 04/22/2015 01/02/15   Davonna Belling, MD  predniSONE (DELTASONE) 20 MG tablet Take 2 tablets (40 mg total) by mouth daily with breakfast. 10/19/15   Orpah Greek, MD  prochlorperazine (COMPAZINE) 10 MG tablet Take 1 tablet (10 mg total) by mouth every 8 (eight) hours as needed (headache). Patient not taking: Reported on 04/22/2015 01/02/15   Davonna Belling, MD    Family History Family History  Problem Relation Age of Onset  . Hypertension Mother   . Hypertension Father   .  Anemia Neg Hx   . Arrhythmia Neg Hx   . Asthma Neg Hx   . Clotting disorder Neg Hx   . Fainting Neg Hx   . Heart attack Neg Hx   . Heart disease Neg Hx   . Heart failure Neg Hx   . Hyperlipidemia Neg Hx   . Colon cancer Neg Hx     Social History Social History  Substance Use Topics  . Smoking status: Never Smoker  . Smokeless tobacco: Never Used  . Alcohol use No     Allergies   Wellbutrin [bupropion hcl]   Review of Systems Review of Systems  All other systems reviewed and are negative.    Physical Exam Updated Vital Signs BP 138/86 (BP  Location: Left Arm)   Pulse 95   Temp 99.2 F (37.3 C) (Oral)   Resp 16   Ht 5\' 7"  (1.702 m)   Wt 171 lb 6 oz (77.7 kg)   SpO2 100%   BMI 26.84 kg/m   Physical Exam  Constitutional: He is oriented to person, place, and time. He appears well-developed and well-nourished. No distress.  HENT:  Head: Normocephalic and atraumatic.  Mouth/Throat: Oropharynx is clear and moist.  Eyes: EOM are normal. Pupils are equal, round, and reactive to light.  Neck: Normal range of motion. Neck supple.  Cardiovascular: Normal rate and regular rhythm.  Exam reveals no friction rub.   No murmur heard. Pulmonary/Chest: Effort normal and breath sounds normal. No respiratory distress. He has no wheezes. He has no rales.  Abdominal: Soft. Bowel sounds are normal. He exhibits no distension. There is no tenderness.  Musculoskeletal: Normal range of motion. He exhibits no edema.  Neurological: He is alert and oriented to person, place, and time. No cranial nerve deficit. He exhibits normal muscle tone. Coordination normal.  Skin: Skin is warm and dry. He is not diaphoretic.  Nursing note and vitals reviewed.    ED Treatments / Results  Labs (all labs ordered are listed, but only abnormal results are displayed) Labs Reviewed  I-STAT CHEM 8, ED - Abnormal; Notable for the following:       Result Value   Calcium, Ion 1.07 (*)    All other components within normal limits  CBC  DIFFERENTIAL  ETHANOL  PROTIME-INR  APTT  COMPREHENSIVE METABOLIC PANEL  URINE RAPID DRUG SCREEN, HOSP PERFORMED  URINALYSIS, ROUTINE W REFLEX MICROSCOPIC (NOT AT [facility])  I-STAT TROPOININ, ED  CBG MONITORING, ED    EKG  EKG Interpretation None       Radiology Ct Head Code Stroke Wo Contrast  Result Date: 10/27/2015 CLINICAL DATA:  Code stroke. Blurred vision. Seen spots. Code stroke. EXAM: CT HEAD WITHOUT CONTRAST TECHNIQUE: Contiguous axial images were obtained from the base of the skull through the vertex without  intravenous contrast. COMPARISON:  CT head without contrast 06/16/2015. MRI brain 08/23/2013. FINDINGS: Slight asymmetric density is present in the right middle cerebral artery. Acute cortical infarct, hemorrhage, or mass lesion is present. The basal ganglia are intact. The insular ribbon is normal bilaterally. The brainstem and cerebellum are normal. The ventricles are of normal size. No significant extra-axial fluid collection is present. The paranasal sinuses and mastoid air cells are clear. ASPECTS [facility] Stroke Program Early CT Score, http://www.aspectsinstroke.com) - Ganglionic level infarction (caudate, lentiform nuclei, internal capsule, insula, M1-M3 cortex): Intact - Supraganglionic infarction (M4-M6 cortex): Intact Total score (0-10 with 10 being normal): 10/10 IMPRESSION: 1. Slight hyperdensity within the right middle cerebral artery. If  the patient has significant right cerebral hemispheric symptoms, this could represent thrombus. This is not the history provided. 2. Otherwise no acute intracranial abnormality. 3. Minimal white matter changes are somewhat advanced for age. 4. ASPECTS score 10/10 These results were called by telephone at the time of interpretation on 10/27/2015 at 9:05 pm to Dr. Leonel Ramsay, who verbally acknowledged these results. Electronically Signed   By: San Morelle M.D.   On: 10/27/2015 21:06    Procedures Procedures (including critical care time)  Medications Ordered in ED Medications  sodium chloride 0.9 % bolus 1,000 mL (not administered)  prochlorperazine (COMPAZINE) injection 10 mg (not administered)  ketorolac (TORADOL) 30 MG/ML injection 30 mg (not administered)  diphenhydrAMINE (BENADRYL) injection 25 mg (not administered)     Initial Impression / Assessment and Plan / ED Course  I have reviewed the triage vital signs and the nursing notes.  Pertinent labs & imaging results that were available during my care of the patient were reviewed by me and  considered in my medical decision making (see chart for details).  Clinical Course      Final Clinical Impressions(s) / ED Diagnoses   Final diagnoses:  Stroke (cerebrum) [facility])   Patient arrives with visual disturbances that are most likely related to aura preceding a migraine headache. His CT scan and MRI are unremarkable. He was evaluated by Dr. Leonel Ramsay from neurology who concurs with my impression. He will be discharged with instructions to follow-up with his neurologist and return if these symptoms worse.  New Prescriptions New Prescriptions   No medications on file     Veryl Speak, MD 10/27/15 2358

## 2015-10-28 NOTE — ED Notes (Signed)
Pt transported to MRI 

## 2015-10-28 NOTE — ED Notes (Signed)
Pt departed in NAD, refused use of wheelchair.  

## 2015-11-05 ENCOUNTER — Ambulatory Visit (INDEPENDENT_AMBULATORY_CARE_PROVIDER_SITE_OTHER): Payer: BLUE CROSS/BLUE SHIELD | Admitting: Neurology

## 2015-11-05 ENCOUNTER — Encounter: Payer: Self-pay | Admitting: Neurology

## 2015-11-05 VITALS — BP 113/77 | HR 65 | Ht 67.0 in | Wt 170.0 lb

## 2015-11-05 DIAGNOSIS — G43809 Other migraine, not intractable, without status migrainosus: Secondary | ICD-10-CM

## 2015-11-05 MED ORDER — DIVALPROEX SODIUM ER 500 MG PO TB24: 500.0000 mg | ORAL_TABLET | Freq: Every day | ORAL | 3 refills | Status: DC

## 2015-11-05 NOTE — Progress Notes (Signed)
[facility] [address] [place]. [place] [address] [phone]       OFFICE CONSULT NOTE  [name] Date of Birth:  [birth year] Medical Record Number:  [id number]   Referring MD:  self Reason for Referral:  Vision disturbances HPI: 50 year [place] male whose had 5 or 6 episodes of recurrent stereotypical visual dysfunction. These are transient and lasts from 45 minutes to an hour and a half maximum. The first occurred in June 2015 when he was working on a computer and noticed that he was seeing spots in front of both his eyes. He had some trouble speaking and finding words and that time. He was seen actually by me and [facility] where he was admitted. MRI scan of the brain that time had shown a very tiny weak diffusion positive lesion in the left parieto-occipital white matter.: It was uncertain whether that represented a infarct or migraine effect. Patient was advised to follow-up with me but was lost to follow-up. He states he has had several such episodes for the last 2 years occurring and a variable frequency but roughly once every 4-5 months. He sees white spots in front of his eyes and at times she has headache which he describes as in the left occipital region that is not severe and he rarely takes medications for it. He does not describe any nausea, vomiting, vertigo, focal extremity weakness numbness gait or balance problems. At times when he coughs he has a stabbing like headache in the back of his head. He has no prior history of migraine headaches but states she gets occasional headaches which are not bothersome. 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 There is no family history of migraines. 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 No other past medical problems. He states he does not take any regular medications. He was recently seen in the emergency room at [facility] with his last episode  on 10/27/15 with transient vision dysfunction for an hour and a half followed by mild headache. MRI scan of the brain was obtained which I which have personally reviewed was normal.  ROS:   14 system review of systems is positive for  headache, blurred vision, loss of vision, visual disturbance, not enough sleep and all systems negative PMH:  Past Medical History:  Diagnosis Date  . Diverticulosis   . External hemorrhoids   . Headache   . Hx of cardiovascular stress test    Myoview 8/10:  no ischemia, EF 62%; low risk  . Hx of Doppler ultrasound    Carotid US 6/15:  bilat ICA 1-39%  . Hx of echocardiogram    echo 6/15:  EF 60-65%, no RWMA  . Internal hemorrhoids   . Palpitations   . Stroke ([place])   . TIA (transient ischemic attack)   . Tubular adenoma of colon   . Vision abnormalities     Social History:  Social History   Social History  . Marital status: Married    Spouse name: N/A  . Number of children: N/A  . Years of education: N/A   Occupational History  . Not on file.   Social History Main Topics  . Smoking status: Never Smoker  . Smokeless tobacco: Never Used  . Alcohol use No  . Drug use: No  . Sexual activity: No   Other Topics Concern  . Not on file   Social History Narrative  .  No narrative on file    Medications:   No current outpatient prescriptions on file prior to visit.   No current facility-administered medications on file prior to visit.     Allergies:   Allergies  Allergen Reactions  . Wellbutrin [Bupropion Hcl] Itching    Physical Exam General: well developed, well nourished Arabic male, seated, in no evident distress Head: head normocephalic and atraumatic.   Neck: supple with no carotid or supraclavicular bruits Cardiovascular: regular rate and rhythm, no murmurs Musculoskeletal: no deformity Skin:  no rash/petichiae Vascular:  Normal pulses all extremities  Neurologic Exam Mental Status: Awake and fully alert. Oriented to  place and time. Recent and remote memory intact. Attention span, concentration and fund of knowledge appropriate. Mood and affect appropriate.  Cranial Nerves: Fundoscopic exam reveals sharp disc margins. Pupils equal, briskly reactive to light. Extraocular movements full without nystagmus. Visual fields full to confrontation. Hearing intact. Facial sensation intact. Face, tongue, palate moves normally and symmetrically.  Motor: Normal bulk and tone. Normal strength in all tested extremity muscles. Sensory.: intact to touch , pinprick , position and vibratory sensation.  Coordination: Rapid alternating movements normal in all extremities. Finger-to-nose and heel-to-shin performed accurately bilaterally. Gait and Station: Arises from chair without difficulty. Stance is normal. Gait demonstrates normal stride length and balance . Able to heel, toe and tandem walk without difficulty.  Reflexes: 1+ and symmetric. Toes downgoing.       ASSESSMENT:  64 year [place] male with recurrent stereotypical episodes of visual disturbance followed by headache likely computed migraine episodes. MRI scan of the brain in June 2015 had shown a small weak diffusion-positive left parieto-occipital lesion which was probably migraine related and not in acute infarct. Repeat MRI scan during the latest episode was normal.   PLAN: I had a long discussion with the patient and reviewed MRI films and results with him. I think his recurrent stereotypical episodes of visual dysfunction followed by headache are likely complicated. migraine episodes. I recommend he try Depakote ER 500 mg daily for migraine prophylaxis. Check EEG for possible seizure activity. I have discussed possible side effects with them and answered questions. Greater than 50% time during this 45 minute visit was spent on counseling and coordination of care about complicated migraine, prevention and treatment He will return for follow-up in 6 months or call  earlier if necessary. Antony Contras, MD  [facility] [address] [place],  [address]  Phone [phone] Fax [phone] Note: This document was prepared with digital dictation and possible smart phrase technology. Any transcriptional errors that result from this process are unintentional.

## 2015-11-05 NOTE — Patient Instructions (Addendum)
I had a long discussion with the patient and reviewed MRI films and results with him. I think his recurrent stereotypical episodes of visual dysfunction followed by headache are likely complicated. migraine episodes. I recommend he try Depakote ER 500 mg daily for migraine prophylaxis. Check EEG for possible seizure activity. I have discussed possible side effects with them and answered questions. He will return for follow-up in 6 months or call earlier if necessary.

## 2015-12-11 ENCOUNTER — Other Ambulatory Visit: Payer: BLUE CROSS/BLUE SHIELD

## 2015-12-12 ENCOUNTER — Encounter: Payer: Self-pay | Admitting: Neurology

## 2016-04-30 ENCOUNTER — Ambulatory Visit: Payer: BLUE CROSS/BLUE SHIELD

## 2016-05-07 ENCOUNTER — Telehealth: Payer: Self-pay

## 2016-05-07 ENCOUNTER — Ambulatory Visit: Payer: BLUE CROSS/BLUE SHIELD | Admitting: Neurology

## 2016-05-07 NOTE — Telephone Encounter (Signed)
PT WAS A NO SHOW FOR APPOINTMENT TODAY.

## 2016-05-09 ENCOUNTER — Encounter: Payer: Self-pay | Admitting: Neurology

## 2016-05-09 ENCOUNTER — Emergency Department (HOSPITAL_COMMUNITY): Payer: BLUE CROSS/BLUE SHIELD

## 2016-05-09 ENCOUNTER — Encounter (HOSPITAL_COMMUNITY): Payer: Self-pay | Admitting: Emergency Medicine

## 2016-05-09 DIAGNOSIS — Z8673 Personal history of transient ischemic attack (TIA), and cerebral infarction without residual deficits: Secondary | ICD-10-CM | POA: Diagnosis not present

## 2016-05-09 DIAGNOSIS — Z79899 Other long term (current) drug therapy: Secondary | ICD-10-CM | POA: Insufficient documentation

## 2016-05-09 DIAGNOSIS — R0789 Other chest pain: Secondary | ICD-10-CM | POA: Diagnosis present

## 2016-05-09 LAB — CBC
HCT: 40.4 % (ref 39.0–52.0)
Hemoglobin: 14.1 g/dL (ref 13.0–17.0)
MCH: 30 pg (ref 26.0–34.0)
MCHC: 34.9 g/dL (ref 30.0–36.0)
MCV: 86 fL (ref 78.0–100.0)
Platelets: 239 10*3/uL (ref 150–400)
RBC: 4.7 MIL/uL (ref 4.22–5.81)
RDW: 12.4 % (ref 11.5–15.5)
WBC: 8.7 10*3/uL (ref 4.0–10.5)

## 2016-05-09 LAB — BASIC METABOLIC PANEL
Anion gap: 8 (ref 5–15)
BUN: 17 mg/dL (ref 6–20)
CO2: 23 mmol/L (ref 22–32)
Calcium: 9.1 mg/dL (ref 8.9–10.3)
Chloride: 108 mmol/L (ref 101–111)
Creatinine, Ser: 1.08 mg/dL (ref 0.61–1.24)
GFR calc Af Amer: >60 mL/min (ref >60)
GFR calc non Af Amer: >60 mL/min (ref >60)
Glucose, Bld: 100 mg/dL — ABNORMAL HIGH (ref 65–99)
Potassium: 4.3 mmol/L (ref 3.5–5.1)
Sodium: 139 mmol/L (ref 135–145)

## 2016-05-09 LAB — I-STAT TROPONIN, ED: Troponin i, poc: 0 ng/mL (ref 0.00–0.08)

## 2016-05-09 NOTE — ED Triage Notes (Signed)
Pt reports having left sided chest pain that started at 1200 today that feels of pressure. Pt denies any radiation at this time.

## 2016-05-10 ENCOUNTER — Emergency Department (HOSPITAL_COMMUNITY)
Admission: EM | Admit: 2016-05-10 | Discharge: 2016-05-10 | Disposition: A | Discharge status: Home / Self Care | Payer: BLUE CROSS/BLUE SHIELD | Attending: Emergency Medicine | Admitting: Emergency Medicine

## 2016-05-10 DIAGNOSIS — R079 Chest pain, unspecified: Secondary | ICD-10-CM

## 2016-05-10 LAB — I-STAT TROPONIN, ED: Troponin i, poc: 0 ng/mL (ref 0.00–0.08)

## 2016-05-10 MED ORDER — ASPIRIN 81 MG PO CHEW
324.0000 mg | CHEWABLE_TABLET | Freq: Once | ORAL | Status: AC
  Administered 2016-05-10: 324 mg via ORAL
  Filled 2016-05-10: qty 4

## 2016-05-10 MED ORDER — NITROGLYCERIN 0.4 MG SL SUBL: 0.4000 mg | SUBLINGUAL_TABLET | SUBLINGUAL | 0 refills | Status: DC | PRN

## 2016-05-10 MED ORDER — NITROGLYCERIN 0.4 MG SL SUBL
0.4000 mg | SUBLINGUAL_TABLET | SUBLINGUAL | Status: DC | PRN
  Administered 2016-05-10 (×2): 0.4 mg via SUBLINGUAL
  Filled 2016-05-10: qty 1

## 2016-05-10 NOTE — ED Provider Notes (Signed)
[facility] DEPT Provider Note   CSN: [id number] Arrival date & time: 05/09/16  2101  By signing my name below, I, [name], attest that this documentation has been prepared under the direction and in the presence of physician practitioner, Delora Fuel, MD. Electronically Signed: [name], Scribe. 05/10/2016. 12:16 AM.  History   Chief Complaint Chief Complaint  Patient presents with  . Chest Pain    The history is provided by the patient. No language interpreter was used.     HPI Comments: Todd [name] is a 56 y.o. male who presents to the Emergency Department complaining of sudden onset, constant, gradually improving, 4/10, left-sided chest pain beginning yesterday afternoon at work. He describes the pain as a pressure-like sensation. Pt reports the pain dissipated briefly while he was sitting in the waiting room but notes it has been constant otherwise. He notes his chest pain is worse with deep inhalation but reports no other worsening factors. No alleviating factors noted. No medications or treatments tried PTA. No h/o the same. No h/o DM, HTN, or HLD. Pt had a stress test ordered by his PCP ~ 3 years ago and the results were normal. He has a FMHx of HTN on both sides of his family. He is a non-smoker. Pt denies nausea, vomiting, diaphoresis, SOB, or any other associated symptoms.  Past Medical History:  Diagnosis Date  . Diverticulosis   . External hemorrhoids   . Headache   . Hx of cardiovascular stress test    Myoview 8/10:  no ischemia, EF 62%; low risk  . Hx of Doppler ultrasound    Carotid US 6/15:  bilat ICA 1-39%  . Hx of echocardiogram    echo 6/15:  EF 60-65%, no RWMA  . Internal hemorrhoids   . Palpitations   . Stroke ([place])   . TIA (transient ischemic attack)   . Tubular adenoma of colon   . Vision abnormalities     Patient Active Problem List   Diagnosis Date Noted  . Palpitations 10/20/2013  . Situational anxiety 10/20/2013  . Migraine with  status migrainosus 08/24/2013  . TIA (transient ischemic attack) 08/23/2013  . Blurred vision 08/23/2013  . Anomia 08/23/2013    Past Surgical History:  Procedure Laterality Date  . APPENDECTOMY  12/01/11   lap appy  . LAPAROSCOPIC APPENDECTOMY  12/02/2011   Procedure: APPENDECTOMY LAPAROSCOPIC;  Surgeon: Madilyn Hook, DO;  Location: [facility];  Service: General;  Laterality: N/A;       Home Medications    Prior to Admission medications   Medication Sig Start Date End Date Taking? Authorizing Provider  divalproex (DEPAKOTE ER) 500 MG 24 hr tablet Take 1 tablet (500 mg total) by mouth daily. Patient not taking: Reported on 05/10/2016 11/05/15   Garvin Fila, MD    Family History Family History  Problem Relation Age of Onset  . Hypertension Mother   . Hypertension Father   . Anemia Neg Hx   . Arrhythmia Neg Hx   . Asthma Neg Hx   . Clotting disorder Neg Hx   . Fainting Neg Hx   . Heart attack Neg Hx   . Heart disease Neg Hx   . Heart failure Neg Hx   . Hyperlipidemia Neg Hx   . Colon cancer Neg Hx     Social History Social History  Substance Use Topics  . Smoking status: Never Smoker  . Smokeless tobacco: Never Used  . Alcohol use No     Allergies  Wellbutrin [bupropion hcl]   Review of Systems Review of Systems  Constitutional: Negative for diaphoresis.  Respiratory: Negative for shortness of breath.   Cardiovascular: Positive for chest pain.  Gastrointestinal: Negative for nausea and vomiting.  All other systems reviewed and are negative.    Physical Exam Updated Vital Signs BP 124/89 (BP Location: Left Arm)   Pulse 78   Temp 98.4 F (36.9 C) (Oral)   Resp 19   Ht 5\' 7"  (1.702 m)   Wt 165 lb (74.8 kg)   SpO2 100%   BMI 25.84 kg/m   Physical Exam  Constitutional: He is oriented to person, place, and time. He appears well-developed and well-nourished.  HENT:  Head: Normocephalic and atraumatic.  Eyes: EOM are normal. Pupils are equal, round,  and reactive to light.  Neck: Normal range of motion. Neck supple. No JVD present.  Cardiovascular: Normal rate, regular rhythm and normal heart sounds.   No murmur heard. Pulmonary/Chest: Effort normal and breath sounds normal. He has no wheezes. He has no rales. He exhibits no tenderness.  Abdominal: Soft. Bowel sounds are normal. He exhibits no distension and no mass. There is no tenderness.  Musculoskeletal: Normal range of motion. He exhibits no edema.  Lymphadenopathy:    He has no cervical adenopathy.  Neurological: He is alert and oriented to person, place, and time. No cranial nerve deficit. He exhibits normal muscle tone. Coordination normal.  Skin: Skin is warm and dry. No rash noted.  Psychiatric: He has a normal mood and affect. His behavior is normal. Judgment and thought content normal.  Nursing note and vitals reviewed.    ED Treatments / Results  Labs (all labs ordered are listed, but only abnormal results are displayed) Labs Reviewed  BASIC METABOLIC PANEL - Abnormal; Notable for the following:       Result Value   Glucose, Bld 100 (*)    All other components within normal limits  CBC  I-STAT TROPOININ, ED  I-STAT TROPOININ, ED    EKG  EKG Interpretation  Date/Time:  Friday May 09 2016 21:07:38 EST Ventricular Rate:  76 PR Interval:    QRS Duration: 92 QT Interval:  378 QTC Calculation: 425 R Axis:   82 Text Interpretation:  Sinus rhythm Baseline wander in lead(s) V6 Otherwise within normal limits When compared with ECG of 10/27/2015, No significant change was found Confirmed by [facility]  MD, Araina Butrick ([id number]) on 05/10/2016 12:12:40 AM       Radiology Dg Chest 2 View  Result Date: 05/09/2016 CLINICAL DATA:  Left-sided chest pain. EXAM: CHEST  2 VIEW COMPARISON:  04/22/2015 FINDINGS: The lungs are clear wiithout focal pneumonia, edema, pneumothorax or pleural effusion. The cardiopericardial silhouette is within normal limits for size. The visualized bony  structures of the thorax are intact. Telemetry leads overlie the chest. IMPRESSION: No active cardiopulmonary disease. Electronically Signed   By: Misty Stanley M.D.   On: 05/09/2016 21:25    Procedures Procedures (including critical care time)  DIAGNOSTIC STUDIES: Oxygen Saturation is 100% on RA, normal by my interpretation.    COORDINATION OF CARE: 12:22 AM Discussed treatment plan with pt at bedside and pt agreed to plan.  Medications Ordered in ED Medications  aspirin chewable tablet 324 mg (not administered)  nitroGLYCERIN (NITROSTAT) SL tablet 0.4 mg (not administered)     Initial Impression / Assessment and Plan / ED Course  I have reviewed the triage vital signs and the nursing notes.  Pertinent labs & imaging results  that were available during my care of the patient were reviewed by me and considered in my medical decision making (see chart for details).  Chest pain in patient with no cardiac risk factors. ECG is normal and troponin is normal 2. Pain did get better with nitroglycerin, but this is a nonspecific response. Heart score equals 1. I feel that he is safe for discharge at this point. He is referred to cardiology for consideration for outpatient stress testing. Old records were reviewed, and he has no relevant past visits.  Final Clinical Impressions(s) / ED Diagnoses   Final diagnoses:  Chest pain, unspecified type    New Prescriptions New Prescriptions   NITROGLYCERIN (NITROSTAT) 0.4 MG SL TABLET    Place 1 tablet (0.4 mg total) under the tongue every 5 (five) minutes as needed for chest pain.   I personally performed the services described in this documentation, which was scribed in my presence. The recorded information has been reviewed and is accurate.     Delora Fuel, MD [id number]

## 2016-09-29 ENCOUNTER — Telehealth: Payer: Self-pay

## 2016-09-29 NOTE — Telephone Encounter (Signed)
SENT NOTES TO SCHEDULING 

## 2016-10-01 ENCOUNTER — Telehealth: Payer: Self-pay | Admitting: Internal Medicine

## 2016-10-01 NOTE — Telephone Encounter (Signed)
Received records from [facility] [place] for appointment on 11/12/16 with Dr Debara Pickett.  Records put with Dr Lysbeth Penner schedule for 11/12/16. lp

## 2016-11-12 ENCOUNTER — Encounter: Payer: Self-pay | Admitting: Internal Medicine

## 2016-11-12 ENCOUNTER — Ambulatory Visit (INDEPENDENT_AMBULATORY_CARE_PROVIDER_SITE_OTHER): Payer: BLUE CROSS/BLUE SHIELD | Admitting: Internal Medicine

## 2016-11-12 VITALS — BP 110/80 | HR 64 | Ht 67.0 in | Wt 164.0 lb

## 2016-11-12 DIAGNOSIS — R0602 Shortness of breath: Secondary | ICD-10-CM

## 2016-11-12 DIAGNOSIS — R002 Palpitations: Secondary | ICD-10-CM

## 2016-11-12 DIAGNOSIS — G43109 Migraine with aura, not intractable, without status migrainosus: Secondary | ICD-10-CM

## 2016-11-12 DIAGNOSIS — R079 Chest pain, unspecified: Secondary | ICD-10-CM | POA: Diagnosis not present

## 2016-11-12 NOTE — Progress Notes (Signed)
OFFICE NOTE  Chief Complaint:  Visual changes, headache, palpitations, chest pain  Primary Care Physician: Wardell Honour, MD  HPI:  Todd [name] is a 56 year old male who was previously seen at [facility] heart and vascular by Dr. Elisabeth Cara. He underwent a stress test, echocardiogram and monitor which were unrevealing. He was told he was well and did not need to follow back up with [place]. He recently had an episode where he had some visual abnormalities including seeing white spots. He presented to the emergency room and there was concern for TIA or stroke. Workup was generally unremarkable except for some cerebral atherosclerosis. It was also considered that he might have had a migraine although he had no headache and has no history of headaches. He currently denies any chest pain although does get some mild shortness of breath with exertion. 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 He does have a family history of heart disease in his father who apparently had an enlarged heart and fluid around the heart. He also has a history of anxiety and was briefly a medication for anxiety and depression in the past. He takes Lipitor for elevated LDL cholesterol recently which was 130.  I saw Todd [name] back in the office today. He denies any worsening palpitations. He was in the emergency room apparently this morning with blurred vision and seeing spots. He also had a headache on the right side of his head. He left apparently before being fully evaluated. He's had episodes like this before. There is question is whether this could be complicated migraine or TIA. He denies any chest pain or worsening shortness of breath.  11/12/2016  Todd [name] was seen today in follow-up. He was recently seen in the ER for chest discomfort. He says he occasionally gets some palpitations which are short-lived and chest discomfort. He's also been having visual  changes. That is typically followed by headache. He underwent neurology evaluation in the past for this as previously described in my notes. MRI showed some cerebral atherosclerosis but was felt to have no evidence of stroke. It was thought that his symptoms are related to migraine. He reports chronic stress and running his own business. She's not currently on medications. He was prescribed some medications for migraines ever since his symptoms improved he stopped taking them and did not follow-up with the neurologist. With regard to the chest pain it is mostly sharp, not necessarily associated with exertion or relieved by rest. Sometimes associated with palpitations.   PMHx:  Past Medical History:  Diagnosis Date  . Diverticulosis   . External hemorrhoids   . Headache   . Hx of cardiovascular stress test    Myoview 8/10:  no ischemia, EF 62%; low risk  . Hx of Doppler ultrasound    Carotid US 6/15:  bilat ICA 1-39%  . Hx of echocardiogram    echo 6/15:  EF 60-65%, no RWMA  . Internal hemorrhoids   . Palpitations   . Stroke ([place])   . TIA (transient ischemic attack)   . Tubular adenoma of colon   . Vision abnormalities     Past Surgical History:  Procedure Laterality Date  . APPENDECTOMY  12/01/11   lap appy  . LAPAROSCOPIC APPENDECTOMY  12/02/2011   Procedure: APPENDECTOMY LAPAROSCOPIC;  Surgeon: Madilyn Hook, DO;  Location: [facility];  Service: General;  Laterality: N/A;    FAMHx:  Family History  Problem  Relation Age of Onset  . Hypertension Mother   . Hypertension Father   . Anemia Neg Hx   . Arrhythmia Neg Hx   . Asthma Neg Hx   . Clotting disorder Neg Hx   . Fainting Neg Hx   . Heart attack Neg Hx   . Heart disease Neg Hx   . Heart failure Neg Hx   . Hyperlipidemia Neg Hx   . Colon cancer Neg Hx     SOCHx:   reports that he has never smoked. He has never used smokeless tobacco. He reports that he does not drink alcohol or use drugs.  ALLERGIES:  Allergies  Allergen  Reactions  . Wellbutrin [Bupropion Hcl] Itching    ROS: Pertinent items noted in HPI and remainder of comprehensive ROS otherwise negative.  HOME MEDS: No current outpatient prescriptions on file.   No current facility-administered medications for this visit.     LABS/IMAGING: No results found for this or any previous visit (from the past 48 hour(s)). No results found.  VITALS: BP 110/80   Pulse 64   Ht 5\' 7"  (1.702 m)   Wt 164 lb (74.4 kg)   BMI 25.69 kg/m   EXAM: General appearance: alert and no distress Neck: no carotid bruit and no JVD Lungs: clear to auscultation bilaterally Heart: regular rate and rhythm, S1, S2 normal, no murmur, click, rub or gallop Abdomen: soft, non-tender; bowel sounds normal; no masses,  no organomegaly Extremities: extremities normal, atraumatic, no cyanosis or edema Pulses: 2+ and symmetric Skin: Skin color, texture, turgor normal. No rashes or lesions Neurologic: Grossly normal Psych: Mildly anxious  EKG: Normal sinus rhythm at 64   ASSESSMENT: 1. Atypical chest pain 2. Palpitations 3. Visual changes with headache - suspect migraine 4. Mild dyspnea with exertion 5. Atherosclerosis/dyslipidemia 6. Anxiety  PLAN: 1.   Todd [name] is describing chest pain which is sharp, short-lived than intermittent and unlikely to be cardiac, but we'll check an exercise treadmill stress test to rule out any ischemia. He had a normal nuclear stress test 2 years ago. I'll also place a two-week monitor to see if we can determine the etiology of his palpitations. Specifically I like to rule out intermittent atrial fibrillation. Overall though I think his symptoms are related to migraine. He might benefit from a low-dose nonspecific beta blocker to treat both palpitations and migraine.  Follow-up with me afterwards.  Pixie Casino, MD, [facility] Attending Cardiologist [place] 11/12/2016, 3:31 PM

## 2016-11-12 NOTE — Patient Instructions (Signed)
Your physician has recommended that you wear an event monitor @ [address]. Event monitors are medical devices that record the heart's electrical activity. Doctors most often [place] these monitors to diagnose arrhythmias. Arrhythmias are problems with the speed or rhythm of the heartbeat. The monitor is a small, portable device. You can wear one while you do your normal daily activities. This is usually used to diagnose what is causing palpitations/syncope (passing out).  Your physician has requested that you have an exercise tolerance test. For further information please visit [email or web address]. Please also follow instruction sheet, as given.  Your physician recommends that you schedule a follow-up appointment with Dr. Debara Pickett after your testing

## 2016-11-18 ENCOUNTER — Telehealth (HOSPITAL_COMMUNITY): Payer: Self-pay

## 2016-11-18 NOTE — Telephone Encounter (Signed)
Encounter complete. 

## 2016-11-20 ENCOUNTER — Ambulatory Visit (HOSPITAL_COMMUNITY)
Admission: RE | Admit: 2016-11-20 | Payer: BLUE CROSS/BLUE SHIELD | Source: Ambulatory Visit | Attending: Internal Medicine | Admitting: Internal Medicine

## 2016-11-21 ENCOUNTER — Telehealth (HOSPITAL_COMMUNITY): Payer: Self-pay

## 2016-11-21 NOTE — Telephone Encounter (Signed)
Encounter complete. 

## 2016-11-25 ENCOUNTER — Ambulatory Visit (INDEPENDENT_AMBULATORY_CARE_PROVIDER_SITE_OTHER): Payer: BLUE CROSS/BLUE SHIELD

## 2016-11-25 DIAGNOSIS — R002 Palpitations: Secondary | ICD-10-CM | POA: Diagnosis not present

## 2016-11-26 ENCOUNTER — Ambulatory Visit (HOSPITAL_COMMUNITY)
Admission: RE | Admit: 2016-11-26 | Discharge: 2016-11-26 | Disposition: A | Discharge status: Home / Self Care | Payer: BLUE CROSS/BLUE SHIELD | Source: Ambulatory Visit | Attending: Cardiology | Admitting: Cardiology

## 2016-11-26 DIAGNOSIS — R079 Chest pain, unspecified: Secondary | ICD-10-CM | POA: Insufficient documentation

## 2016-11-26 DIAGNOSIS — R0602 Shortness of breath: Secondary | ICD-10-CM

## 2016-11-26 DIAGNOSIS — R002 Palpitations: Secondary | ICD-10-CM | POA: Insufficient documentation

## 2016-11-26 LAB — EXERCISE TOLERANCE TEST
Estimated workload: 12.8 METS
Exercise duration (min): 10 min
Exercise duration (sec): 31 s
MPHR: 166 {beats}/min
Peak HR: 162 {beats}/min
Percent HR: 97 %
RPE: 17
Rest HR: 68 {beats}/min

## 2016-11-28 ENCOUNTER — Telehealth: Payer: Self-pay | Admitting: *Deleted

## 2016-11-28 NOTE — Telephone Encounter (Signed)
Left a message for the patient to call back, per dpr. He will also need a follow up appointment.  Notes recorded by Pixie Casino, MD on 11/27/2016 at 8:19 AM EDT Low risk, adequate exercise stress test. No ischemia.  DR. Lemmie Evens

## 2017-04-04 ENCOUNTER — Encounter: Payer: Self-pay | Admitting: Physician Assistant

## 2017-04-04 ENCOUNTER — Ambulatory Visit: Payer: BLUE CROSS/BLUE SHIELD | Admitting: Physician Assistant

## 2017-04-04 VITALS — BP 120/75 | HR 62 | Temp 98.0°F | Resp 16 | Ht 67.0 in | Wt 170.0 lb

## 2017-04-04 DIAGNOSIS — R220 Localized swelling, mass and lump, head: Secondary | ICD-10-CM | POA: Diagnosis not present

## 2017-04-04 DIAGNOSIS — R234 Changes in skin texture: Secondary | ICD-10-CM

## 2017-04-04 MED ORDER — DOXYCYCLINE HYCLATE 100 MG PO CAPS: 100.0000 mg | ORAL_CAPSULE | Freq: Two times a day (BID) | ORAL | 0 refills | Status: AC

## 2017-04-04 NOTE — Patient Instructions (Addendum)
  Come back on Wednesday if you are not better.  Come back on Monday if you are worse.  If you develop fever, chills, nausea, or dizziness with worsening facial pain this go to the ED.   Use a warm compress often on the area of concern.     IF you received an x-ray today, you will receive an invoice from [facility]. Please contact [facility] at [phone] with questions or concerns regarding your invoice.   IF you received labwork today, you will receive an invoice from [place]. Please contact [facility] at [phone] with questions or concerns regarding your invoice.   Our billing staff will not be able to assist you with questions regarding bills from these companies.  You will be contacted with the lab results as soon as they are available. The fastest way to get your results is to activate your My Chart account. Instructions are located on the last page of this paperwork. If you have not heard from [place] regarding the results in 2 weeks, please contact this office.

## 2017-04-04 NOTE — Progress Notes (Signed)
04/04/2017 3:01 PM   DOB: 1960-06-28 / MRN: [id number]  SUBJECTIVE:  Todd [name] is a 57 y.o. male presenting for upper lip swelling that started after eating a new type of tuna.  He denies itching.  He associates pain.   He is allergic to wellbutrin [bupropion hcl].   He  has a past medical history of Diverticulosis, External hemorrhoids, Headache, cardiovascular stress test, Doppler ultrasound, echocardiogram, Internal hemorrhoids, Palpitations, Stroke ([place]), TIA (transient ischemic attack), Tubular adenoma of colon, and Vision abnormalities.    He  reports that  has never smoked. he has never used smokeless tobacco. He reports that he does not drink alcohol or use drugs. He  reports that he does not engage in sexual activity. The patient  has a past surgical history that includes laparoscopic appendectomy (12/02/2011) and Appendectomy (12/01/11).  His family history includes Hypertension in his father and mother.  Review of Systems  Constitutional: Negative for chills, diaphoresis and fever.  Eyes: Negative.   Respiratory: Negative for cough, hemoptysis, sputum production, shortness of breath and wheezing.   Cardiovascular: Negative for chest pain, orthopnea and leg swelling.  Gastrointestinal: Negative for abdominal pain, blood in stool, constipation, diarrhea, heartburn, melena, nausea and vomiting.  Genitourinary: Negative for flank pain.  Skin: Negative for rash.  Neurological: Negative for dizziness, sensory change, speech change, focal weakness and headaches.    The problem list and medications were reviewed and updated by myself where necessary and exist elsewhere in the encounter.   OBJECTIVE:  BP 120/75   Pulse 62   Temp 98 F (36.7 C) (Oral)   Resp 16   Ht 5\' 7"  (1.702 m)   Wt 170 lb (77.1 kg)   SpO2 100%   BMI 26.63 kg/m   Wt Readings from Last 3 Encounters:  04/04/17 170 lb (77.1 kg)  11/12/16 164 lb (74.4 kg)  05/09/16 165 lb (74.8 kg)   Temp Readings from  Last 3 Encounters:  04/04/17 98 F (36.7 C) (Oral)  05/09/16 98.4 F (36.9 C) (Oral)  10/27/15 99.2 F (37.3 C)   BP Readings from Last 3 Encounters:  04/04/17 120/75  11/12/16 110/80  05/10/16 105/72   Pulse Readings from Last 3 Encounters:  04/04/17 62  11/12/16 64  05/10/16 (!) 55     Physical Exam  Constitutional: He appears well-developed. He is active and cooperative.  Non-toxic appearance.  HENT:  Mouth/Throat:    Cardiovascular: Normal rate, regular rhythm, S1 normal, S2 normal, normal heart sounds, intact distal pulses and normal pulses. Exam reveals no gallop and no friction rub.  No murmur heard. Pulmonary/Chest: Effort normal. No stridor. No tachypnea. No respiratory distress. He has no wheezes. He has no rales.  Abdominal: He exhibits no distension.  Musculoskeletal: He exhibits no edema.  Neurological: He is alert.  Skin: Skin is warm and dry. He is not diaphoretic. No pallor.  Vitals reviewed.   No results found for this or any previous visit (from the past 72 hour(s)).  No results found.  ASSESSMENT AND PLAN:  Todd [name] was seen today for oral swelling.  Diagnoses and all orders for this visit:  Skin induration -     doxycycline (VIBRAMYCIN) 100 MG capsule; Take 1 capsule (100 mg total) by mouth 2 (two) times daily for 10 days.  Facial swelling -     doxycycline (VIBRAMYCIN) 100 MG capsule; Take 1 capsule (100 mg total) by mouth 2 (two) times daily for 10 days.    The patient  is advised to call or return to clinic if he does not see an improvement in symptoms, or to seek the care of the closest emergency department if he worsens with the above plan.   Philis Fendt, MHS, PA-C [facility] 04/04/2017 3:01 PM

## 2017-04-08 ENCOUNTER — Ambulatory Visit: Payer: BLUE CROSS/BLUE SHIELD | Admitting: Physician Assistant

## 2017-04-16 ENCOUNTER — Ambulatory Visit: Payer: BLUE CROSS/BLUE SHIELD | Admitting: Family Medicine

## 2017-04-16 ENCOUNTER — Encounter: Payer: Self-pay | Admitting: Family Medicine

## 2017-04-16 VITALS — BP 123/78 | HR 84 | Temp 99.3°F | Resp 16 | Ht 66.0 in | Wt 171.0 lb

## 2017-04-16 DIAGNOSIS — J029 Acute pharyngitis, unspecified: Secondary | ICD-10-CM | POA: Diagnosis not present

## 2017-04-16 LAB — POCT RAPID STREP A (OFFICE): Rapid Strep A Screen: NEGATIVE

## 2017-04-16 MED ORDER — MAGIC MOUTHWASH W/LIDOCAINE: 5.0000 mL | Freq: Three times a day (TID) | ORAL | 0 refills | Status: DC | PRN

## 2017-04-16 NOTE — Patient Instructions (Addendum)
Use magic mouthwash     IF you received an x-ray today, you will receive an invoice from [facility]. Please contact [facility] at [phone] with questions or concerns regarding your invoice.   IF you received labwork today, you will receive an invoice from [place]. Please contact [facility] at [phone] with questions or concerns regarding your invoice.   Our billing staff will not be able to assist you with questions regarding bills from these companies.  You will be contacted with the lab results as soon as they are available. The fastest way to get your results is to activate your My Chart account. Instructions are located on the last page of this paperwork. If you have not heard from [place] regarding the results in 2 weeks, please contact this office.      Sore Throat A sore throat is pain, burning, irritation, or scratchiness in the throat. When you have a sore throat, you may feel pain or tenderness in your throat when you swallow or talk. Many things can cause a sore throat, including:  An infection.  Seasonal allergies.  Dryness in the air.  Irritants, such as smoke or pollution.  Gastroesophageal reflux disease (GERD).  A tumor.  A sore throat is often the first sign of another sickness. It may happen with other symptoms, such as coughing, sneezing, fever, and swollen neck glands. Most sore throats go away without medical treatment. Follow these instructions at home:  Take over-the-counter medicines only as told by your health care provider.  Drink enough fluids to keep your urine clear or pale yellow.  Rest as needed.  To help with pain, try: ? Sipping warm liquids, such as broth, herbal tea, or warm water. ? Eating or drinking cold or frozen liquids, such as frozen ice pops. ? Gargling with a salt-water mixture 3-4 times a day or as needed. To make a salt-water mixture, completely dissolve -1 tsp of salt in 1 cup of warm water. ? Sucking on  hard candy or throat lozenges. ? Putting a cool-mist humidifier in your bedroom at night to moisten the air. ? Sitting in the bathroom with the door closed for 5-10 minutes while you run hot water in the shower.  Do not use any tobacco products, such as cigarettes, chewing tobacco, and e-cigarettes. If you need help quitting, ask your health care provider. Contact a health care provider if:  You have a fever for more than 2-3 days.  You have symptoms that last (are persistent) for more than 2-3 days.  Your throat does not get better within 7 days.  You have a fever and your symptoms suddenly get worse. Get help right away if:  You have difficulty breathing.  You cannot swallow fluids, soft foods, or your saliva.  You have increased swelling in your throat or neck.  You have persistent nausea and vomiting. This information is not intended to replace advice given to you by your health care provider. Make sure you discuss any questions you have with your health care provider. Document Released: 04/17/2004 Document Revised: 11/04/2015 Document Reviewed: 12/29/2014 Elsevier Interactive Patient Education  [employer].

## 2017-04-16 NOTE — Progress Notes (Signed)
Chief Complaint  Patient presents with  . Sore Throat    right sided neck pain worse with swallowing x1 day - denies cough, fevers, chills    HPI  Patient woke up this morning with right side neck pain that radiates into his ears No fevers, chills or cough He is concerned about the pain that is radiating to his right ear and neck He has pain with swallowing  Past Medical History:  Diagnosis Date  . Diverticulosis   . External hemorrhoids   . Headache   . Hx of cardiovascular stress test    Myoview 8/10:  no ischemia, EF 62%; low risk  . Hx of Doppler ultrasound    Carotid US 6/15:  bilat ICA 1-39%  . Hx of echocardiogram    echo 6/15:  EF 60-65%, no RWMA  . Internal hemorrhoids   . Palpitations   . Stroke ([place])   . TIA (transient ischemic attack)   . Tubular adenoma of colon   . Vision abnormalities     Current Outpatient Medications  Medication Sig Dispense Refill  . magic mouthwash w/lidocaine SOLN Take 5 mLs by mouth 3 (three) times daily as needed for mouth pain. 150 mL 0   No current facility-administered medications for this visit.     Allergies:  Allergies  Allergen Reactions  . Wellbutrin [Bupropion Hcl] Itching    Past Surgical History:  Procedure Laterality Date  . APPENDECTOMY  12/01/11   lap appy  . LAPAROSCOPIC APPENDECTOMY  12/02/2011   Procedure: APPENDECTOMY LAPAROSCOPIC;  Surgeon: Madilyn Hook, DO;  Location: [facility];  Service: General;  Laterality: N/A;    Social History   Socioeconomic History  . Marital status: Married    Spouse name: None  . Number of children: None  . Years of education: None  . Highest education level: None  Social Needs  . Financial resource strain: None  . Food insecurity - worry: None  . Food insecurity - inability: None  . Transportation needs - medical: None  . Transportation needs - non-medical: None  Occupational History  . None  Tobacco Use  . Smoking status: Never Smoker  . Smokeless tobacco: Never  Used  Substance and Sexual Activity  . Alcohol use: No  . Drug use: No  . Sexual activity: No  Other Topics Concern  . None  Social History Narrative  . None    Family History  Problem Relation Age of Onset  . Hypertension Mother   . Hypertension Father   . Anemia Neg Hx   . Arrhythmia Neg Hx   . Asthma Neg Hx   . Clotting disorder Neg Hx   . Fainting Neg Hx   . Heart attack Neg Hx   . Heart disease Neg Hx   . Heart failure Neg Hx   . Hyperlipidemia Neg Hx   . Colon cancer Neg Hx      ROS Review of Systems See HPI Constitution: No fevers or chills No malaise No diaphoresis Skin: No rash or itching Eyes: no blurry vision, no double vision GU: no dysuria or hematuria Neuro: no dizziness or headaches all others reviewed and negative   Objective: Vitals:   04/16/17 1222  BP: 123/78  Pulse: 84  Resp: 16  Temp: 99.3 F (37.4 C)  TempSrc: Oral  SpO2: 98%  Weight: 171 lb (77.6 kg)  Height: 5\' 6"  (1.676 m)    Physical Exam General: alert, oriented, in NAD Head: normocephalic, atraumatic, no sinus tenderness Eyes:  EOM intact, no scleral icterus or conjunctival injection Ears: TM clear bilaterally Nose: mucosa nonerythematous, nonedematous Throat: right side edema, +pharyngeal exudate and erythema Lymph: no posterior auricular, submental or cervical lymph adenopathy Heart: normal rate, normal sinus rhythm, no murmurs Lungs: clear to auscultation bilaterally, no wheezing   Assessment and Plan Todd [name] was seen today for sore throat.  Diagnoses and all orders for this visit:  Sore throat- negative for strep Gave magic mouthwash to gargle -     POCT rapid strep A -     magic mouthwash w/lidocaine SOLN; Take 5 mLs by mouth 3 (three) times daily as needed for mouth pain.  Recipe: equal parts diphenhydramine 12.5mg /36ml, prednisone 5mg /ml, nystatin suspension, viscous lidocaine 2%   Melody Savidge A Malakye Nolden

## 2017-08-18 ENCOUNTER — Emergency Department (HOSPITAL_BASED_OUTPATIENT_CLINIC_OR_DEPARTMENT_OTHER)
Admission: EM | Admit: 2017-08-18 | Discharge: 2017-08-18 | Discharge status: Against Medical Advice | Payer: BLUE CROSS/BLUE SHIELD

## 2018-02-16 ENCOUNTER — Other Ambulatory Visit: Payer: Self-pay

## 2018-02-16 ENCOUNTER — Emergency Department (HOSPITAL_BASED_OUTPATIENT_CLINIC_OR_DEPARTMENT_OTHER)
Admission: EM | Admit: 2018-02-16 | Discharge: 2018-02-16 | Disposition: A | Discharge status: Home / Self Care | Payer: BLUE CROSS/BLUE SHIELD | Attending: Emergency Medicine | Admitting: Emergency Medicine

## 2018-02-16 ENCOUNTER — Emergency Department (HOSPITAL_BASED_OUTPATIENT_CLINIC_OR_DEPARTMENT_OTHER): Payer: BLUE CROSS/BLUE SHIELD

## 2018-02-16 ENCOUNTER — Encounter (HOSPITAL_BASED_OUTPATIENT_CLINIC_OR_DEPARTMENT_OTHER): Payer: Self-pay

## 2018-02-16 DIAGNOSIS — Z8673 Personal history of transient ischemic attack (TIA), and cerebral infarction without residual deficits: Secondary | ICD-10-CM | POA: Insufficient documentation

## 2018-02-16 DIAGNOSIS — R002 Palpitations: Secondary | ICD-10-CM | POA: Insufficient documentation

## 2018-02-16 HISTORY — DX: Migraine with aura, not intractable, without status migrainosus: G43.109

## 2018-02-16 LAB — BASIC METABOLIC PANEL
Anion gap: 7 (ref 5–15)
BUN: 18 mg/dL (ref 6–20)
CO2: 25 mmol/L (ref 22–32)
Calcium: 8.8 mg/dL — ABNORMAL LOW (ref 8.9–10.3)
Chloride: 107 mmol/L (ref 98–111)
Creatinine, Ser: 1.05 mg/dL (ref 0.61–1.24)
GFR calc Af Amer: >60 mL/min (ref >60)
GFR calc non Af Amer: >60 mL/min (ref >60)
Glucose, Bld: 119 mg/dL — ABNORMAL HIGH (ref 70–99)
Potassium: 3.6 mmol/L (ref 3.5–5.1)
Sodium: 139 mmol/L (ref 135–145)

## 2018-02-16 LAB — CBC
HCT: 41 % (ref 39.0–52.0)
Hemoglobin: 13.4 g/dL (ref 13.0–17.0)
MCH: 29.1 pg (ref 26.0–34.0)
MCHC: 32.7 g/dL (ref 30.0–36.0)
MCV: 89.1 fL (ref 80.0–100.0)
Platelets: 240 10*3/uL (ref 150–400)
RBC: 4.6 MIL/uL (ref 4.22–5.81)
RDW: 12 % (ref 11.5–15.5)
WBC: 7.6 10*3/uL (ref 4.0–10.5)
nRBC: 0 % (ref 0.0–0.2)

## 2018-02-16 LAB — MAGNESIUM: Magnesium: 2.1 mg/dL (ref 1.7–2.4)

## 2018-02-16 LAB — TROPONIN I: Troponin I: <0.03 ng/mL (ref <0.03)

## 2018-02-16 NOTE — ED Provider Notes (Signed)
Emergency Department Provider Note   I have reviewed the triage vital signs and the nursing notes.   HISTORY  Chief Complaint Palpitations   HPI Braddock Servellon is a 57 y.o. male with PMH of palpitations presents to the emergency department for evaluation of acute onset heart palpitations which lasted longer than normal.  She states that this has been an ongoing problem for him but today the symptoms lasted longer than normal so he called EMS.  He states once the heart palpitations did not go away he "freaked out" and starting having some associated SOB. No CP.  No active symptoms on arrival.  He states that his palpitations lasted for approximately 15 min. No fever or chills.  Does not drink alcohol or caffeine.  No drug use.  He has been to the cardiologist multiple times and has had outpatient monitoring, stressed, and echo without acute findings.  Last work-up was 2 years ago.   Past Medical History:  Diagnosis Date  . Complicated migraine   . Diverticulosis   . External hemorrhoids   . Headache   . Hx of cardiovascular stress test    Myoview 8/10:  no ischemia, EF 62%; low risk  . Hx of Doppler ultrasound    Carotid US 6/15:  bilat ICA 1-39%  . Hx of echocardiogram    echo 6/15:  EF 60-65%, no RWMA  . Internal hemorrhoids   . Palpitations   . Stroke ([place])   . TIA (transient ischemic attack)   . Tubular adenoma of colon   . Vision abnormalities     Patient Active Problem List   Diagnosis Date Noted  . Shortness of breath 11/12/2016  . Chest pain 11/12/2016  . Heart palpitations 10/20/2013  . Situational anxiety 10/20/2013  . Migraine with visual aura 08/24/2013  . TIA (transient ischemic attack) 08/23/2013  . Blurred vision 08/23/2013  . Anomia 08/23/2013    Past Surgical History:  Procedure Laterality Date  . APPENDECTOMY  12/01/11   lap appy  . LAPAROSCOPIC APPENDECTOMY  12/02/2011   Procedure: APPENDECTOMY LAPAROSCOPIC;  Surgeon: Madilyn Hook, DO;  Location: [facility];  Service: General;  Laterality: N/A;    Current Outpatient Rx  . Order #: [id number] Class: Print    Allergies Wellbutrin [bupropion hcl]  Family History  Problem Relation Age of Onset  . Hypertension Mother   . Hypertension Father   . Anemia Neg Hx   . Arrhythmia Neg Hx   . Asthma Neg Hx   . Clotting disorder Neg Hx   . Fainting Neg Hx   . Heart attack Neg Hx   . Heart disease Neg Hx   . Heart failure Neg Hx   . Hyperlipidemia Neg Hx   . Colon cancer Neg Hx     Social History Social History   Tobacco Use  . Smoking status: Never Smoker  . Smokeless tobacco: Never Used  Substance Use Topics  . Alcohol use: No  . Drug use: No    Review of Systems  Constitutional: No fever/chills Eyes: No visual changes. ENT: No sore throat. Cardiovascular: Denies chest pain. Positive heart palpitations.  Respiratory: Positive shortness of breath. Gastrointestinal: No abdominal pain.  No nausea, no vomiting.  No diarrhea.  No constipation. Genitourinary: Negative for dysuria. Musculoskeletal: Negative for back pain. Skin: Negative for rash. Neurological: Negative for headaches, focal weakness or numbness.  10-point ROS otherwise negative.  ____________________________________________   PHYSICAL EXAM:  VITAL SIGNS: ED Triage Vitals [02/16/18 1835]  Enc  Vitals Group     BP (!) 144/91     Pulse Rate 80     Resp 17     Temp 98.2 F (36.8 C)     Temp Source Oral     SpO2 99 %     Weight 170 lb (77.1 kg)     Height 5\' 7"  (1.702 m)     Pain Score 0   Constitutional: Alert and oriented. Well appearing and in no acute distress. Eyes: Conjunctivae are normal.  Head: Atraumatic. Nose: No congestion/rhinnorhea. Mouth/Throat: Mucous membranes are moist.  Neck: No stridor. No thyromegaly.  Cardiovascular: Normal rate, regular rhythm. Good peripheral circulation. Grossly normal heart sounds.   Respiratory: Normal respiratory effort.  No retractions. Lungs  CTAB. Gastrointestinal: Soft and nontender. No distention.  Musculoskeletal: No lower extremity tenderness nor edema. No gross deformities of extremities. Neurologic:  Normal speech and language. No gross focal neurologic deficits are appreciated.  Skin:  Skin is warm, dry and intact. No rash noted. ____________________________________________   LABS (all labs ordered are listed, but only abnormal results are displayed)  Labs Reviewed  BASIC METABOLIC PANEL - Abnormal; Notable for the following components:      Result Value   Glucose, Bld 119 (*)    Calcium 8.8 (*)    All other components within normal limits  CBC  TROPONIN I  MAGNESIUM   ____________________________________________  EKG  HR: 83 PR: 152 QTc: 447.   Sinus rhythm. Narrow QRS. No ST elevation or depression. No STEMI. Normal T waves.  ____________________________________________  RADIOLOGY  Dg Chest 2 View  Result Date: 02/16/2018 CLINICAL DATA:  57 year old male with history of chest palpitations and dizziness. EXAM: CHEST - 2 VIEW COMPARISON:  Chest x-ray 05/09/2016. FINDINGS: Lung volumes are normal. No consolidative airspace disease. No pleural effusions. No pneumothorax. No pulmonary nodule or mass noted. Pulmonary vasculature and the cardiomediastinal silhouette are within normal limits. IMPRESSION: No radiographic evidence of acute cardiopulmonary disease. Electronically Signed   By: Vinnie Langton M.D.   On: 02/16/2018 19:38    ____________________________________________   PROCEDURES  Procedure(s) performed:   Procedures  None  ____________________________________________   INITIAL IMPRESSION / ASSESSMENT AND PLAN / ED COURSE  Pertinent labs & imaging results that were available during my care of the patient were reviewed by me and considered in my medical decision making (see chart for details).  Patient presents to the emergency department for evaluation of heart palpitations.  This  is been an ongoing issue for the patient and has had multiple outpatient work-ups for palpitations.  No symptoms on arrival and he is in sinus rhythm with no acute changes on EKG.  No thyromegaly.  No clear cause of symptoms.  Given his shortness of breath I do plan on chest x-ray along with baseline labs including magnesium.  Do not feel he would benefit from thyroid studies in the emergency department but have advised he follow-up with his primary care physician regarding this possibility.   7:53 PM Lab work and chest x-ray reviewed with no acute findings.  EKG reviewed as above.  I discussed the patient's symptoms with him in detail.  Advise close PCP and cardiology follow-up. Discussed ED return precautions in detail.  ____________________________________________  FINAL CLINICAL IMPRESSION(S) / ED DIAGNOSES  Final diagnoses:  Palpitations    Note:  This document was prepared using Dragon voice recognition software and may include unintentional dictation errors.  Nanda Quinton, MD Emergency Medicine    Hayven Fatima, [facility],  MD 02/16/18 1954

## 2018-02-16 NOTE — ED Triage Notes (Signed)
Pt presents via [facility] for palpitations and dizziness. Hx of same. VSS. EKG normal sinus. Onset approx 1hr ago- episode lasted about 36min. Pt has seen cardiologist for same in past.

## 2018-02-16 NOTE — ED Notes (Signed)
ED Provider at bedside. 

## 2018-02-16 NOTE — Discharge Instructions (Signed)
You were seen in the ED today with heart palpitations. Drink plenty of fluids and call your PCP for a follow up appointment. Call the Cardiologist listed, or your personal Cardiologist, to discuss your continued symptoms and make a plan for following up. Return to the ED with any chest pain, trouble breathing, lightheadedness, or palpitations that do not resolve.

## 2018-07-16 ENCOUNTER — Other Ambulatory Visit: Payer: Self-pay

## 2018-07-16 ENCOUNTER — Emergency Department (HOSPITAL_COMMUNITY): Payer: BLUE CROSS/BLUE SHIELD

## 2018-07-16 ENCOUNTER — Emergency Department (HOSPITAL_COMMUNITY)
Admission: EM | Admit: 2018-07-16 | Discharge: 2018-07-16 | Disposition: A | Discharge status: Home / Self Care | Payer: BLUE CROSS/BLUE SHIELD | Attending: Emergency Medicine | Admitting: Emergency Medicine

## 2018-07-16 ENCOUNTER — Encounter (HOSPITAL_COMMUNITY): Payer: Self-pay | Admitting: Emergency Medicine

## 2018-07-16 DIAGNOSIS — R0602 Shortness of breath: Secondary | ICD-10-CM | POA: Diagnosis present

## 2018-07-16 DIAGNOSIS — Z8673 Personal history of transient ischemic attack (TIA), and cerebral infarction without residual deficits: Secondary | ICD-10-CM | POA: Insufficient documentation

## 2018-07-16 DIAGNOSIS — R002 Palpitations: Secondary | ICD-10-CM | POA: Diagnosis not present

## 2018-07-16 LAB — BASIC METABOLIC PANEL
Anion gap: 8 (ref 5–15)
BUN: 13 mg/dL (ref 6–20)
CO2: 26 mmol/L (ref 22–32)
Calcium: 9.3 mg/dL (ref 8.9–10.3)
Chloride: 110 mmol/L (ref 98–111)
Creatinine, Ser: 0.91 mg/dL (ref 0.61–1.24)
GFR calc Af Amer: >60 mL/min (ref >60)
GFR calc non Af Amer: >60 mL/min (ref >60)
Glucose, Bld: 90 mg/dL (ref 70–99)
Potassium: 4.2 mmol/L (ref 3.5–5.1)
Sodium: 144 mmol/L (ref 135–145)

## 2018-07-16 LAB — CBC
HCT: 45.9 % (ref 39.0–52.0)
Hemoglobin: 15 g/dL (ref 13.0–17.0)
MCH: 29.4 pg (ref 26.0–34.0)
MCHC: 32.7 g/dL (ref 30.0–36.0)
MCV: 89.8 fL (ref 80.0–100.0)
Platelets: 263 10*3/uL (ref 150–400)
RBC: 5.11 MIL/uL (ref 4.22–5.81)
RDW: 12.2 % (ref 11.5–15.5)
WBC: 9.5 10*3/uL (ref 4.0–10.5)
nRBC: 0 % (ref 0.0–0.2)

## 2018-07-16 LAB — TROPONIN I: Troponin I: <0.03 ng/mL (ref <0.03)

## 2018-07-16 NOTE — ED Triage Notes (Signed)
Pt c/o SHOB within the past hour and feeling like his heart is beating differently. Pt also reports abdominal pain earlier today, but not now. Pt reports headache and buzzing in his ear. Pt denies CP, LOC, N/V/D

## 2018-07-16 NOTE — ED Provider Notes (Signed)
[facility] EMERGENCY DEPARTMENT Provider Note   CSN: [id number] Arrival date & time: 07/16/18  0004    History   Chief Complaint Chief Complaint  Patient presents with  . Shortness of Breath    HPI Todd [name] is a 58 y.o. male.     Patient presents to the emergency department with a chief complaint of shortness of breath and palpitations.  He states that he has had these symptoms intermittently for the past several years.  He states that he saw cardiologist and wore a heart monitor, but nothing abnormal was found.  He does not take any medications for the symptoms.  He states that he is under a lot of stress, but does not have a diagnosis of anxiety.  He states that his symptoms have improved, and he now feels normal, but states that it is concerning how frequent these episodes palpitations occur.  He also reports having had headache and a "buzzing sound in ear."  The history is provided by the patient.    Past Medical History:  Diagnosis Date  . Complicated migraine   . Diverticulosis   . External hemorrhoids   . Headache   . Hx of cardiovascular stress test    Myoview 8/10:  no ischemia, EF 62%; low risk  . Hx of Doppler ultrasound    Carotid US 6/15:  bilat ICA 1-39%  . Hx of echocardiogram    echo 6/15:  EF 60-65%, no RWMA  . Internal hemorrhoids   . Palpitations   . Stroke ([place])   . TIA (transient ischemic attack)   . Tubular adenoma of colon   . Vision abnormalities     Patient Active Problem List   Diagnosis Date Noted  . Shortness of breath 11/12/2016  . Chest pain 11/12/2016  . Heart palpitations 10/20/2013  . Situational anxiety 10/20/2013  . Migraine with visual aura 08/24/2013  . TIA (transient ischemic attack) 08/23/2013  . Blurred vision 08/23/2013  . Anomia 08/23/2013    Past Surgical History:  Procedure Laterality Date  . APPENDECTOMY  12/01/11   lap appy  . LAPAROSCOPIC APPENDECTOMY  12/02/2011   Procedure: APPENDECTOMY  LAPAROSCOPIC;  Surgeon: Madilyn Hook, DO;  Location: [facility];  Service: General;  Laterality: N/A;        Home Medications    Prior to Admission medications   Medication Sig Start Date End Date Taking? Authorizing Provider  magic mouthwash w/lidocaine SOLN Take 5 mLs by mouth 3 (three) times daily as needed for mouth pain. 04/16/17   Forrest Moron, MD    Family History Family History  Problem Relation Age of Onset  . Hypertension Mother   . Hypertension Father   . Anemia Neg Hx   . Arrhythmia Neg Hx   . Asthma Neg Hx   . Clotting disorder Neg Hx   . Fainting Neg Hx   . Heart attack Neg Hx   . Heart disease Neg Hx   . Heart failure Neg Hx   . Hyperlipidemia Neg Hx   . Colon cancer Neg Hx     Social History Social History   Tobacco Use  . Smoking status: Never Smoker  . Smokeless tobacco: Never Used  Substance Use Topics  . Alcohol use: No  . Drug use: No     Allergies   Wellbutrin [bupropion hcl]   Review of Systems Review of Systems  All other systems reviewed and are negative.    Physical Exam Updated Vital Signs  BP 129/83 (BP Location: Left Arm) Comment: Simultaneous filing. User may not have seen previous data.  Pulse 64 Comment: Simultaneous filing. User may not have seen previous data.  Temp 97.9 F (36.6 C) (Oral)   Resp 12   Ht 5\' 7"  (1.702 m)   Wt 69.9 kg   SpO2 100% Comment: Simultaneous filing. User may not have seen previous data.  BMI 24.12 kg/m   Physical Exam Vitals signs and nursing note reviewed.  Constitutional:      Appearance: He is well-developed.  HENT:     Head: Normocephalic and atraumatic.  Eyes:     Conjunctiva/sclera: Conjunctivae normal.  Neck:     Musculoskeletal: Neck supple.  Cardiovascular:     Rate and Rhythm: Normal rate and regular rhythm.     Pulses: Normal pulses.     Heart sounds: No murmur.     Comments: Intact distal pulses Pulmonary:     Effort: Pulmonary effort is normal. No respiratory  distress.     Breath sounds: Normal breath sounds.  Abdominal:     Palpations: Abdomen is soft.     Tenderness: There is no abdominal tenderness.  Skin:    General: Skin is warm and dry.  Neurological:     Mental Status: He is alert.      ED Treatments / Results  Labs (all labs ordered are listed, but only abnormal results are displayed) Labs Reviewed - No data to display  EKG None  Radiology No results found.  Procedures Procedures (including critical care time)  Medications Ordered in ED Medications - No data to display   Initial Impression / Assessment and Plan / ED Course  I have reviewed the triage vital signs and the nursing notes.  Pertinent labs & imaging results that were available during my care of the patient were reviewed by me and considered in my medical decision making (see chart for details).        Patient complaining of palpitations and intermittent shortness of breath times several years.  States that his symptoms worsened today.  Patient is well-appearing.  Physical exam is unremarkable.  He states that he is under a lot of stress, this could be anxiety.  He has seen a cardiologist in the past and has worn a heart monitor, but no abnormality was recorded at that time.  I discussed that if our work-up tonight is reassuring, he may want to follow back up with a cardiologist.  consider also PCP follow-up for evaluation of anxiety.  Labs and EKG and imaging are reassuring.  DC to home with return precautions and close outpatient follow-up.  Final Clinical Impressions(s) / ED Diagnoses   Final diagnoses:  Palpitations    ED Discharge Orders    None       Montine Circle, PA-C 07/16/18 9166    Orpah Greek, MD 07/16/18 [phone]

## 2018-07-16 NOTE — ED Notes (Signed)
All appropriate discharge materials reviewed with patient at length. Time for questions provided. Pt denies any further questions at this time. Verbalizes understanding of all provided materials.  

## 2018-10-14 DIAGNOSIS — H6501 Acute serous otitis media, right ear: Secondary | ICD-10-CM | POA: Insufficient documentation

## 2019-02-26 ENCOUNTER — Other Ambulatory Visit: Payer: Self-pay

## 2019-02-26 ENCOUNTER — Emergency Department (HOSPITAL_BASED_OUTPATIENT_CLINIC_OR_DEPARTMENT_OTHER)
Admission: EM | Admit: 2019-02-26 | Discharge: 2019-02-26 | Disposition: A | Discharge status: Home / Self Care | Payer: BLUE CROSS/BLUE SHIELD | Attending: Emergency Medicine | Admitting: Emergency Medicine

## 2019-02-26 ENCOUNTER — Encounter (HOSPITAL_BASED_OUTPATIENT_CLINIC_OR_DEPARTMENT_OTHER): Payer: Self-pay | Admitting: Emergency Medicine

## 2019-02-26 ENCOUNTER — Emergency Department (HOSPITAL_BASED_OUTPATIENT_CLINIC_OR_DEPARTMENT_OTHER): Payer: BLUE CROSS/BLUE SHIELD

## 2019-02-26 DIAGNOSIS — R0789 Other chest pain: Secondary | ICD-10-CM | POA: Diagnosis present

## 2019-02-26 DIAGNOSIS — Z8673 Personal history of transient ischemic attack (TIA), and cerebral infarction without residual deficits: Secondary | ICD-10-CM | POA: Diagnosis not present

## 2019-02-26 LAB — TROPONIN I (HIGH SENSITIVITY): Troponin I (High Sensitivity): <2 ng/L (ref <18)

## 2019-02-26 LAB — BASIC METABOLIC PANEL
Anion gap: 7 (ref 5–15)
BUN: 17 mg/dL (ref 6–20)
CO2: 23 mmol/L (ref 22–32)
Calcium: 8.8 mg/dL — ABNORMAL LOW (ref 8.9–10.3)
Chloride: 108 mmol/L (ref 98–111)
Creatinine, Ser: 0.88 mg/dL (ref 0.61–1.24)
GFR calc Af Amer: >60 mL/min (ref >60)
GFR calc non Af Amer: >60 mL/min (ref >60)
Glucose, Bld: 144 mg/dL — ABNORMAL HIGH (ref 70–99)
Potassium: 3.7 mmol/L (ref 3.5–5.1)
Sodium: 138 mmol/L (ref 135–145)

## 2019-02-26 LAB — CBC
HCT: 43.6 % (ref 39.0–52.0)
Hemoglobin: 14.6 g/dL (ref 13.0–17.0)
MCH: 29.3 pg (ref 26.0–34.0)
MCHC: 33.5 g/dL (ref 30.0–36.0)
MCV: 87.4 fL (ref 80.0–100.0)
Platelets: 223 10*3/uL (ref 150–400)
RBC: 4.99 MIL/uL (ref 4.22–5.81)
RDW: 12 % (ref 11.5–15.5)
WBC: 7.9 10*3/uL (ref 4.0–10.5)
nRBC: 0 % (ref 0.0–0.2)

## 2019-02-26 MED ORDER — SODIUM CHLORIDE 0.9% FLUSH
3.0000 mL | Freq: Once | INTRAVENOUS | Status: DC
  Filled 2019-02-26: qty 3

## 2019-02-26 NOTE — ED Provider Notes (Signed)
[facility] EMERGENCY DEPARTMENT Provider Note   CSN: [id number] Arrival date & time: 02/26/19  1919     History   Chief Complaint Chief Complaint  Patient presents with  . Chest Pain    HPI Todd [name] is a 58 y.o. male.     The history is provided by the patient and medical records. No language interpreter was used.  Chest Pain  Todd [name] is a 58 y.o. male who presents to the Emergency Department complaining of chest pain. He presents the emergency department complaining of left sided chest pain that began about four days ago. Symptoms are waxing and waning with no clear alleviating or worsening factors. He does have worsening symptoms on palpation. Pain is described as if sensation you feel after your hit with a stick. He denies any fevers, cough, shortness of breath, nausea, vomiting, diarrhea, leg swelling or pain. He has known medical problems and takes no prescription medications. No history of blood clots. He does not smoke, drink alcohol, or use illicit drugs. Symptoms are moderate in nature. Past Medical History:  Diagnosis Date  . Complicated migraine   . Diverticulosis   . External hemorrhoids   . Headache   . Hx of cardiovascular stress test    Myoview 8/10:  no ischemia, EF 62%; low risk  . Hx of Doppler ultrasound    Carotid US 6/15:  bilat ICA 1-39%  . Hx of echocardiogram    echo 6/15:  EF 60-65%, no RWMA  . Internal hemorrhoids   . Palpitations   . Stroke ([place])   . TIA (transient ischemic attack)   . Tubular adenoma of colon   . Vision abnormalities     Patient Active Problem List   Diagnosis Date Noted  . Shortness of breath 11/12/2016  . Chest pain 11/12/2016  . Heart palpitations 10/20/2013  . Situational anxiety 10/20/2013  . Migraine with visual aura 08/24/2013  . TIA (transient ischemic attack) 08/23/2013  . Blurred vision 08/23/2013  . Anomia 08/23/2013    Past Surgical History:  Procedure Laterality Date  . APPENDECTOMY   12/01/11   lap appy  . LAPAROSCOPIC APPENDECTOMY  12/02/2011   Procedure: APPENDECTOMY LAPAROSCOPIC;  Surgeon: Madilyn Hook, DO;  Location: [facility];  Service: General;  Laterality: N/A;        Home Medications    Prior to Admission medications   Medication Sig Start Date End Date Taking? Authorizing Provider  magic mouthwash w/lidocaine SOLN Take 5 mLs by mouth 3 (three) times daily as needed for mouth pain. 04/16/17   Forrest Moron, MD    Family History Family History  Problem Relation Age of Onset  . Hypertension Mother   . Hypertension Father   . Anemia Neg Hx   . Arrhythmia Neg Hx   . Asthma Neg Hx   . Clotting disorder Neg Hx   . Fainting Neg Hx   . Heart attack Neg Hx   . Heart disease Neg Hx   . Heart failure Neg Hx   . Hyperlipidemia Neg Hx   . Colon cancer Neg Hx     Social History Social History   Tobacco Use  . Smoking status: Never Smoker  . Smokeless tobacco: Never Used  Substance Use Topics  . Alcohol use: No  . Drug use: No     Allergies   Wellbutrin [bupropion hcl]   Review of Systems Review of Systems  Cardiovascular: Positive for chest pain.  All other systems reviewed and  are negative.    Physical Exam Updated Vital Signs BP 114/74   Pulse 64   Temp 98.6 F (37 C) (Oral)   Resp 17   Ht 5\' 7"  (1.702 m)   Wt 72.6 kg   SpO2 98%   BMI 25.06 kg/m   Physical Exam Vitals signs and nursing note reviewed.  Constitutional:      Appearance: He is well-developed.  HENT:     Head: Normocephalic and atraumatic.  Cardiovascular:     Rate and Rhythm: Normal rate and regular rhythm.     Heart sounds: No murmur.  Pulmonary:     Effort: Pulmonary effort is normal. No respiratory distress.     Breath sounds: Normal breath sounds.  Chest:     Chest wall: Tenderness present.  Abdominal:     Palpations: Abdomen is soft.     Tenderness: There is no abdominal tenderness. There is no guarding or rebound.  Musculoskeletal:        General:  No swelling or tenderness.  Skin:    General: Skin is warm and dry.  Neurological:     Mental Status: He is alert and oriented to person, place, and time.  Psychiatric:        Behavior: Behavior normal.      ED Treatments / Results  Labs (all labs ordered are listed, but only abnormal results are displayed) Labs Reviewed  BASIC METABOLIC PANEL - Abnormal; Notable for the following components:      Result Value   Glucose, Bld 144 (*)    Calcium 8.8 (*)    All other components within normal limits  CBC  TROPONIN I (HIGH SENSITIVITY)  TROPONIN I (HIGH SENSITIVITY)    EKG EKG Interpretation  Date/Time:  Saturday February 26 2019 19:23:01 EST Ventricular Rate:  84 PR Interval:  146 QRS Duration: 82 QT Interval:  386 QTC Calculation: 456 R Axis:   56 Text Interpretation: Normal sinus rhythm Low voltage QRS Borderline ECG Confirmed by Quintella Reichert [phone]) on 02/26/2019 7:26:54 PM   Radiology Dg Chest 2 View  Result Date: 02/26/2019 CLINICAL DATA:  Chest pain EXAM: CHEST - 2 VIEW COMPARISON:  Chest radiograph dated 07/16/2018 FINDINGS: The heart size and mediastinal contours are within normal limits. Both lungs are clear. The visualized skeletal structures are unremarkable. IMPRESSION: No active cardiopulmonary disease. Electronically Signed   By: Zerita Boers M.D.   On: 02/26/2019 19:58    Procedures Procedures (including critical care time)  Medications Ordered in ED Medications  sodium chloride flush (NS) 0.9 % injection 3 mL (3 mLs Intravenous Not Given 02/26/19 1958)     Initial Impression / Assessment and Plan / ED Course  I have reviewed the triage vital signs and the nursing notes.  Pertinent labs & imaging results that were available during my care of the patient were reviewed by me and considered in my medical decision making (see chart for details).        Patient here for evaluation of four days of intermittent chest pain, reproducible on  examination. EKG without acute ischemic changes. Troponin is negative. Presentation is not consistent with PE, ACS, dissection, pneumonia. Discussed with patient home care for chest wall pain. Discussed PCP follow-up and return precautions.  Final Clinical Impressions(s) / ED Diagnoses   Final diagnoses:  Chest wall pain    ED Discharge Orders    None       Quintella Reichert, MD 02/26/19 2119

## 2019-02-26 NOTE — ED Triage Notes (Signed)
Patient states that he has had pain to his left chest x 4 days - he reports denies any SOb or cough

## 2019-04-12 ENCOUNTER — Ambulatory Visit (HOSPITAL_COMMUNITY)
Admission: EM | Admit: 2019-04-12 | Discharge: 2019-04-12 | Discharge status: Against Medical Advice | Payer: BLUE CROSS/BLUE SHIELD

## 2019-04-12 NOTE — ED Notes (Signed)
Patient called for registration several times without response.

## 2019-04-13 ENCOUNTER — Ambulatory Visit (INDEPENDENT_AMBULATORY_CARE_PROVIDER_SITE_OTHER): Payer: 59

## 2019-04-13 ENCOUNTER — Encounter (HOSPITAL_COMMUNITY): Payer: Self-pay

## 2019-04-13 ENCOUNTER — Other Ambulatory Visit: Payer: Self-pay

## 2019-04-13 ENCOUNTER — Ambulatory Visit (HOSPITAL_COMMUNITY)
Admission: EM | Admit: 2019-04-13 | Discharge: 2019-04-13 | Disposition: A | Discharge status: Home / Self Care | Payer: 59 | Attending: Urgent Care | Admitting: Urgent Care

## 2019-04-13 DIAGNOSIS — M62830 Muscle spasm of back: Secondary | ICD-10-CM

## 2019-04-13 DIAGNOSIS — M546 Pain in thoracic spine: Secondary | ICD-10-CM | POA: Diagnosis not present

## 2019-04-13 DIAGNOSIS — M5489 Other dorsalgia: Secondary | ICD-10-CM | POA: Diagnosis not present

## 2019-04-13 MED ORDER — PREDNISONE 10 MG PO TABS: 30.0000 mg | ORAL_TABLET | Freq: Every day | ORAL | 0 refills | Status: DC

## 2019-04-13 MED ORDER — CYCLOBENZAPRINE HCL 5 MG PO TABS: 5.0000 mg | ORAL_TABLET | Freq: Every evening | ORAL | 0 refills | Status: DC | PRN

## 2019-04-13 NOTE — ED Provider Notes (Signed)
[place]   MRN: [id number] DOB: 1960-04-19  Subjective:   Todd [name] is a 59 y.o. male presenting for several day history of right sided upper back pain with associated pleuritic pain.  States that anytime he takes a deep breath his back starts to hurt again and is worse with exertion.  He does not feel short of breath per se but doesn't want to take a full deep breath due to the pain that he feels in his back.  Denies fever, falls, trauma, chest pain, cough, wheezing, headache, confusion, nausea, vomiting, belly pain.  He does have a history of stroke, TIA and shortness of breath.  He tried ibuprofen with minimal relief.  Patient admits that he does not hydrate well.  States that he has been taking in more salt to drink himself and drink more water but does not like it at all.  No current facility-administered medications for this encounter.  Current Outpatient Medications:  .  magic mouthwash w/lidocaine SOLN, Take 5 mLs by mouth 3 (three) times daily as needed for mouth pain., Disp: 150 mL, Rfl: 0   Allergies  Allergen Reactions  . Wellbutrin [Bupropion Hcl] Itching    Past Medical History:  Diagnosis Date  . Complicated migraine   . Diverticulosis   . External hemorrhoids   . Headache   . Hx of cardiovascular stress test    Myoview 8/10:  no ischemia, EF 62%; low risk  . Hx of Doppler ultrasound    Carotid US 6/15:  bilat ICA 1-39%  . Hx of echocardiogram    echo 6/15:  EF 60-65%, no RWMA  . Internal hemorrhoids   . Palpitations   . Stroke ([place])   . TIA (transient ischemic attack)   . Tubular adenoma of colon   . Vision abnormalities      Past Surgical History:  Procedure Laterality Date  . APPENDECTOMY  12/01/11   lap appy  . LAPAROSCOPIC APPENDECTOMY  12/02/2011   Procedure: APPENDECTOMY LAPAROSCOPIC;  Surgeon: Madilyn Hook, DO;  Location: [facility];  Service: General;  Laterality: N/A;    Family History  Problem Relation Age of Onset  . Hypertension  Mother   . Hypertension Father   . Anemia Neg Hx   . Arrhythmia Neg Hx   . Asthma Neg Hx   . Clotting disorder Neg Hx   . Fainting Neg Hx   . Heart attack Neg Hx   . Heart disease Neg Hx   . Heart failure Neg Hx   . Hyperlipidemia Neg Hx   . Colon cancer Neg Hx     Social History   Tobacco Use  . Smoking status: Never Smoker  . Smokeless tobacco: Never Used  Substance Use Topics  . Alcohol use: No  . Drug use: No    ROS   Objective:   Vitals: BP 136/85 (BP Location: Right Arm)   Pulse 67   Temp 97.8 F (36.6 C) (Oral)   Resp 16   Wt 169 lb (76.7 kg)   SpO2 100%   BMI 26.47 kg/m   Physical Exam Constitutional:      General: He is not in acute distress.    Appearance: Normal appearance. He is well-developed. He is not ill-appearing, toxic-appearing or diaphoretic.  HENT:     Head: Normocephalic and atraumatic.     Right Ear: External ear normal.     Left Ear: External ear normal.     Nose: Nose normal.  Mouth/Throat:     Mouth: Mucous membranes are moist.     Pharynx: Oropharynx is clear.  Eyes:     General: No scleral icterus.    Extraocular Movements: Extraocular movements intact.     Pupils: Pupils are equal, round, and reactive to light.  Cardiovascular:     Rate and Rhythm: Normal rate and regular rhythm.     Heart sounds: Normal heart sounds. No murmur. No friction rub. No gallop.   Pulmonary:     Effort: Pulmonary effort is normal. No respiratory distress.     Breath sounds: Normal breath sounds. No stridor. No wheezing, rhonchi or rales.  Musculoskeletal:     Thoracic back: Spasms and tenderness (over area outlined with associated spasm of paraspinal muscle) present. No swelling, edema, deformity, signs of trauma, lacerations or bony tenderness. Normal range of motion. No scoliosis.       Back:  Skin:    General: Skin is warm and dry.  Neurological:     Mental Status: He is alert and oriented to person, place, and time.  Psychiatric:          Mood and Affect: Mood normal.        Behavior: Behavior normal.        Thought Content: Thought content normal.        Judgment: Judgment normal.     DG Chest 2 View  Result Date: 04/13/2019 CLINICAL DATA:  Back pain for 3 days EXAM: CHEST - 2 VIEW COMPARISON:  02/26/2019 FINDINGS: The heart size and mediastinal contours are within normal limits. Both lungs are clear. The visualized skeletal structures are unremarkable. IMPRESSION: No active cardiopulmonary disease. Electronically Signed   By: Kathreen Devoid   On: 04/13/2019 14:18     Assessment and Plan :   1. Acute right-sided thoracic back pain   2. Spasm of thoracic back muscle     Will manage for severe back pain related to spasm, dehydration with prednisone course at 30 mg x 7 days and muscle relaxant, rest and modification of physical activity.  Anticipatory guidance provided.  Counseled patient on potential for adverse effects with medications prescribed/recommended today, ER and return-to-clinic precautions discussed, patient verbalized understanding.    Jaynee Eagles, [place] 04/13/19 1439

## 2019-04-13 NOTE — ED Triage Notes (Signed)
Pt states she has upper back pain. Pt states when he takes a deep breathe the pain starts.

## 2019-04-18 ENCOUNTER — Ambulatory Visit (INDEPENDENT_AMBULATORY_CARE_PROVIDER_SITE_OTHER): Payer: 59

## 2019-04-18 ENCOUNTER — Encounter (HOSPITAL_COMMUNITY): Payer: Self-pay

## 2019-04-18 ENCOUNTER — Ambulatory Visit (HOSPITAL_COMMUNITY)
Admission: EM | Admit: 2019-04-18 | Discharge: 2019-04-18 | Disposition: A | Discharge status: Home / Self Care | Payer: 59 | Attending: Internal Medicine | Admitting: Internal Medicine

## 2019-04-18 ENCOUNTER — Other Ambulatory Visit: Payer: Self-pay

## 2019-04-18 DIAGNOSIS — R140 Abdominal distension (gaseous): Secondary | ICD-10-CM

## 2019-04-18 DIAGNOSIS — R030 Elevated blood-pressure reading, without diagnosis of hypertension: Secondary | ICD-10-CM

## 2019-04-18 MED ORDER — SENNOSIDES-DOCUSATE SODIUM 8.6-50 MG PO TABS: 1.0000 | ORAL_TABLET | Freq: Two times a day (BID) | ORAL | 0 refills | Status: DC

## 2019-04-18 NOTE — ED Triage Notes (Signed)
Patient presents to Urgent Care with complaints of hypertension since the past 3-4 days. Patient reports he has also felt bloated, has a BM every 3 days.

## 2019-04-18 NOTE — ED Provider Notes (Signed)
[place]    CSN: [id number] Arrival date & time: 04/18/19  1703      History   Chief Complaint Chief Complaint  Patient presents with  . Hypertension    HPI Todd [name] is a 59 y.o. male with no past medical history comes to urgent care on account of elevated blood pressure, abdominal bloating and increased weight gain over the past week. Patient was recently started on oral steroids for back pain. Patient is on day 4 of steroid use. No nausea or vomiting. No abdominal pain. Patient complains of a feeling of incomplete bowel emptying although he does not feel constipated. He denies any fluctuance. Last colonoscopy was 5 yrs ago. No black or dark stools. No blood in stools.  HPI  Past Medical History:  Diagnosis Date  . Complicated migraine   . Diverticulosis   . External hemorrhoids   . Headache   . Hx of cardiovascular stress test    Myoview 8/10:  no ischemia, EF 62%; low risk  . Hx of Doppler ultrasound    Carotid US 6/15:  bilat ICA 1-39%  . Hx of echocardiogram    echo 6/15:  EF 60-65%, no RWMA  . Internal hemorrhoids   . Palpitations   . Stroke ([place])   . TIA (transient ischemic attack)   . Tubular adenoma of colon   . Vision abnormalities     Patient Active Problem List   Diagnosis Date Noted  . Shortness of breath 11/12/2016  . Chest pain 11/12/2016  . Heart palpitations 10/20/2013  . Situational anxiety 10/20/2013  . Migraine with visual aura 08/24/2013  . TIA (transient ischemic attack) 08/23/2013  . Blurred vision 08/23/2013  . Anomia 08/23/2013    Past Surgical History:  Procedure Laterality Date  . APPENDECTOMY  12/01/11   lap appy  . LAPAROSCOPIC APPENDECTOMY  12/02/2011   Procedure: APPENDECTOMY LAPAROSCOPIC;  Surgeon: Madilyn Hook, DO;  Location: [facility];  Service: General;  Laterality: N/A;       Home Medications    Prior to Admission medications   Medication Sig Start Date End Date Taking? Authorizing Provider    cyclobenzaprine (FLEXERIL) 5 MG tablet Take 1-2 tablets (5-10 mg total) by mouth at bedtime as needed for muscle spasms. 04/13/19  Yes Jaynee Eagles, PA-C  cloNIDine (CATAPRES) 0.1 MG tablet Take 1 tablet (0.1 mg total) by mouth 2 (two) times daily. Take 1 tablet for blood pressure greater than [id number] systolic and 95 diastolic [id number]   Wardell Honour, MD  magic mouthwash w/lidocaine SOLN Take 5 mLs by mouth 3 (three) times daily as needed for mouth pain. 04/16/17   Forrest Moron, MD  senna-docusate (SENOKOT-S) 8.6-50 MG tablet Take 1 tablet by mouth 2 (two) times daily. Hold for diarrhea 04/18/19 05/18/19  Marice Angelino, Myrene Galas, MD    Family History Family History  Problem Relation Age of Onset  . Hypertension Mother   . Hypertension Father   . Anemia Neg Hx   . Arrhythmia Neg Hx   . Asthma Neg Hx   . Clotting disorder Neg Hx   . Fainting Neg Hx   . Heart attack Neg Hx   . Heart disease Neg Hx   . Heart failure Neg Hx   . Hyperlipidemia Neg Hx   . Colon cancer Neg Hx     Social History Social History   Tobacco Use  . Smoking status: Never Smoker  . Smokeless tobacco: Never Used  Substance Use Topics  .  Alcohol use: No  . Drug use: No     Allergies   Wellbutrin [bupropion hcl]   Review of Systems Review of Systems  Constitutional: Negative for activity change, chills, fatigue and fever.  HENT: Negative for congestion, postnasal drip and rhinorrhea.   Respiratory: Negative for cough, chest tightness and shortness of breath.   Gastrointestinal: Negative for blood in stool, diarrhea, nausea and vomiting.  Genitourinary: Negative for dysuria, frequency and urgency.  Musculoskeletal: Negative for arthralgias and back pain.  Skin: Negative.   Neurological: Negative for dizziness, weakness, light-headedness and headaches.  Psychiatric/Behavioral: Negative for confusion and decreased concentration.     Physical Exam Triage Vital Signs ED Triage Vitals  Enc Vitals Group      BP 04/18/19 1801 (!) 142/85     Pulse Rate 04/18/19 1801 79     Resp 04/18/19 1801 16     Temp 04/18/19 1801 98 F (36.7 C)     Temp Source 04/18/19 1801 Oral     SpO2 04/18/19 1801 100 %     Weight --      Height --      Head Circumference --      Peak Flow --      Pain Score 04/18/19 1800 0     Pain Loc --      Pain Edu? --      Excl. in [place]? --    No data found.  Updated Vital Signs BP (!) 142/85 (BP Location: Right Arm)   Pulse 79   Temp 98 F (36.7 C) (Oral)   Resp 16   SpO2 100%   Visual Acuity Right Eye Distance:   Left Eye Distance:   Bilateral Distance:    Right Eye Near:   Left Eye Near:    Bilateral Near:     Physical Exam Vitals and nursing note reviewed.  Constitutional:      General: He is not in acute distress.    Appearance: Normal appearance. He is not ill-appearing.  Cardiovascular:     Rate and Rhythm: Normal rate and regular rhythm.     Pulses: Normal pulses.     Heart sounds: Normal heart sounds.  Pulmonary:     Effort: Pulmonary effort is normal. No respiratory distress.     Breath sounds: Normal breath sounds. No wheezing or rhonchi.  Abdominal:     General: Bowel sounds are normal. There is no distension.     Palpations: Abdomen is soft.     Tenderness: There is no abdominal tenderness. There is no guarding or rebound.     Hernia: No hernia is present.  Musculoskeletal:        General: No swelling, tenderness, deformity or signs of injury. Normal range of motion.  Skin:    General: Skin is warm.     Capillary Refill: Capillary refill takes less than 2 seconds.     Coloration: Skin is not jaundiced.     Findings: No bruising, erythema or lesion.  Neurological:     General: No focal deficit present.     Mental Status: He is alert.      UC Treatments / Results  Labs (all labs ordered are listed, but only abnormal results are displayed) Labs Reviewed - No data to display  EKG   Radiology DG Abd 1 View  Result Date:  04/18/2019 CLINICAL DATA:  Acute abdominal distension and pain. EXAM: ABDOMEN - 1 VIEW COMPARISON:  None. FINDINGS: Nondistended gas-filled loops of small bowel colon  noted. No distended bowel loops are present. No suspicious calcifications are noted. Bony structures are unremarkable. IMPRESSION: Nonspecific nonobstructive bowel gas pattern. Electronically Signed   By: Margarette Canada M.D.   On: 04/18/2019 18:48    Procedures Procedures (including critical care time)  Medications Ordered in UC Medications - No data to display  Initial Impression / Assessment and Plan / UC Course  I have reviewed the triage vital signs and the nursing notes.  Pertinent labs & imaging results that were available during my care of the patient were reviewed by me and considered in my medical decision making (see chart for details).     1.Abdominal bloating: Discontinue Prednisone Senna BID Patient is encouraged to schedule screening colonoscopy If patients symptoms are persistent or worse ,he is welcome to return to urgent care for re-evaluation.  Final Clinical Impressions(s) / UC Diagnoses   Final diagnoses:  Abdominal bloating  Elevated blood pressure, situational   Discharge Instructions   None    ED Prescriptions    Medication Sig Dispense Auth. Provider   senna-docusate (SENOKOT-S) 8.6-50 MG tablet Take 1 tablet by mouth 2 (two) times daily. Hold for diarrhea 60 tablet Maleko Greulich, Myrene Galas, MD     PDMP not reviewed this encounter.   Chase Picket, MD 04/19/19 [phone]

## 2019-04-18 NOTE — ED Notes (Signed)
Instructed to put on gown for xray

## 2019-04-19 ENCOUNTER — Ambulatory Visit (HOSPITAL_COMMUNITY)
Admission: EM | Admit: 2019-04-19 | Discharge: 2019-04-19 | Disposition: A | Discharge status: Home / Self Care | Payer: 59 | Attending: Family Medicine | Admitting: Family Medicine

## 2019-04-19 ENCOUNTER — Encounter (HOSPITAL_COMMUNITY): Payer: Self-pay

## 2019-04-19 ENCOUNTER — Other Ambulatory Visit: Payer: Self-pay

## 2019-04-19 DIAGNOSIS — I10 Essential (primary) hypertension: Secondary | ICD-10-CM

## 2019-04-19 MED ORDER — CLONIDINE HCL 0.1 MG PO TABS: 0.1000 mg | ORAL_TABLET | Freq: Two times a day (BID) | ORAL | 11 refills | Status: DC

## 2019-04-19 NOTE — Discharge Instructions (Addendum)
Monitor blood pressure on regular basis Follow-up with Dr. Billie Lade Re: Abdominal bloating and irregular bowel habits

## 2019-04-19 NOTE — ED Triage Notes (Signed)
Pt states he has a B/P issue and he says his stomach is bloated. X 2 days or more. Pt states he feels full. But he has not ate anything today.

## 2019-04-19 NOTE — ED Provider Notes (Signed)
[place]    CSN: [id number] Arrival date & time: 04/19/19  1808      History   Chief Complaint Chief Complaint  Patient presents with  . Hypertension    HPI Todd [name] is a 59 y.o. male.   Patient is here tonight with complaints of elevated blood pressure and bloating in his abdomen.  He was seen 1 week ago for upper back pain and was seen yesterday for abdominal pain and blood pressure.  X-ray from yesterday shows some increased gas but no dilated loops of bowel and no signs of obstruction.  Bowel habits have changed about 6 months ago from moving daily to about every third day.  He did have colonoscopy 5 years ago which showed mild diverticulosis and at least one polyp  He follows his blood pressures closely and pressures have been borderline elevated from time to time.  HPI  Past Medical History:  Diagnosis Date  . Complicated migraine   . Diverticulosis   . External hemorrhoids   . Headache   . Hx of cardiovascular stress test    Myoview 8/10:  no ischemia, EF 62%; low risk  . Hx of Doppler ultrasound    Carotid US 6/15:  bilat ICA 1-39%  . Hx of echocardiogram    echo 6/15:  EF 60-65%, no RWMA  . Internal hemorrhoids   . Palpitations   . Stroke ([place])   . TIA (transient ischemic attack)   . Tubular adenoma of colon   . Vision abnormalities     Patient Active Problem List   Diagnosis Date Noted  . Shortness of breath 11/12/2016  . Chest pain 11/12/2016  . Heart palpitations 10/20/2013  . Situational anxiety 10/20/2013  . Migraine with visual aura 08/24/2013  . TIA (transient ischemic attack) 08/23/2013  . Blurred vision 08/23/2013  . Anomia 08/23/2013    Past Surgical History:  Procedure Laterality Date  . APPENDECTOMY  12/01/11   lap appy  . LAPAROSCOPIC APPENDECTOMY  12/02/2011   Procedure: APPENDECTOMY LAPAROSCOPIC;  Surgeon: Madilyn Hook, DO;  Location: [facility];  Service: General;  Laterality: N/A;       Home Medications    Prior  to Admission medications   Medication Sig Start Date End Date Taking? Authorizing Provider  cyclobenzaprine (FLEXERIL) 5 MG tablet Take 1-2 tablets (5-10 mg total) by mouth at bedtime as needed for muscle spasms. 04/13/19   Jaynee Eagles, PA-C  magic mouthwash w/lidocaine SOLN Take 5 mLs by mouth 3 (three) times daily as needed for mouth pain. 04/16/17   Forrest Moron, MD  senna-docusate (SENOKOT-S) 8.6-50 MG tablet Take 1 tablet by mouth 2 (two) times daily. Hold for diarrhea 04/18/19 05/18/19  Lam[place]ey, Myrene Galas, MD    Family History Family History  Problem Relation Age of Onset  . Hypertension Mother   . Hypertension Father   . Anemia Neg Hx   . Arrhythmia Neg Hx   . Asthma Neg Hx   . Clotting disorder Neg Hx   . Fainting Neg Hx   . Heart attack Neg Hx   . Heart disease Neg Hx   . Heart failure Neg Hx   . Hyperlipidemia Neg Hx   . Colon cancer Neg Hx     Social History Social History   Tobacco Use  . Smoking status: Never Smoker  . Smokeless tobacco: Never Used  Substance Use Topics  . Alcohol use: No  . Drug use: No     Allergies  Wellbutrin [bupropion hcl]   Review of Systems Review of Systems  Gastrointestinal: Positive for abdominal pain.  Psychiatric/Behavioral: The patient is nervous/anxious.   All other systems reviewed and are negative.    Physical Exam Triage Vital Signs ED Triage Vitals  Enc Vitals Group     BP 04/19/19 1826 (!) 144/89     Pulse Rate 04/19/19 1826 74     Resp 04/19/19 1826 18     Temp 04/19/19 1826 98.1 F (36.7 C)     Temp src --      SpO2 --      Weight 04/19/19 1828 168 lb (76.2 kg)     Height --      Head Circumference --      Peak Flow --      Pain Score --      Pain Loc --      Pain Edu? --      Excl. in [place]? --    No data found.  Updated Vital Signs BP (!) 144/89 (BP Location: Right Arm)   Pulse 74   Temp 98.1 F (36.7 C)   Resp 18   Wt 76.2 kg   BMI 26.31 kg/m   Visual Acuity Right Eye Distance:   Left  Eye Distance:   Bilateral Distance:    Right Eye Near:   Left Eye Near:    Bilateral Near:     Physical Exam Vitals and nursing note reviewed.  Constitutional:      Appearance: Normal appearance. He is normal weight.  HENT:     Mouth/Throat:     Mouth: Mucous membranes are moist.  Cardiovascular:     Rate and Rhythm: Regular rhythm.  Pulmonary:     Effort: Pulmonary effort is normal.     Breath sounds: Normal breath sounds.  Abdominal:     General: Abdomen is flat. Bowel sounds are normal. There is no distension.     Palpations: There is no mass.     Tenderness: There is no abdominal tenderness. There is no guarding or rebound.     Hernia: No hernia is present.  Neurological:     Mental Status: He is alert.  Psychiatric:        Mood and Affect: Mood normal.        Behavior: Behavior normal.      UC Treatments / Results  Labs (all labs ordered are listed, but only abnormal results are displayed) Labs Reviewed - No data to display  EKG   Radiology DG Abd 1 View  Result Date: 04/18/2019 CLINICAL DATA:  Acute abdominal distension and pain. EXAM: ABDOMEN - 1 VIEW COMPARISON:  None. FINDINGS: Nondistended gas-filled loops of small bowel colon noted. No distended bowel loops are present. No suspicious calcifications are noted. Bony structures are unremarkable. IMPRESSION: Nonspecific nonobstructive bowel gas pattern. Electronically Signed   By: Margarette Canada M.D.   On: 04/18/2019 18:48    Procedures Procedures (including critical care time)  Medications Ordered in UC Medications - No data to display  Initial Impression / Assessment and Plan / UC Course  I have reviewed the triage vital signs and the nursing notes.  Pertinent labs & imaging results that were available during my care of the patient were reviewed by me and considered in my medical decision making (see chart for details).     Abdominal bloating with irregular bowel habits.  Borderline blood pressure.   Now with his third visit in less than a week he  seems worried about self.  He is starting a new job tomorrow and that may be part of the problem.  I have suggested he follow-up with gastroenterologist who did study 5 years ago  Final Clinical Impressions(s) / UC Diagnoses   Final diagnoses:  None   Discharge Instructions   None    ED Prescriptions    None     PDMP not reviewed this encounter.   Wardell Honour, MD 04/19/19 [phone]

## 2019-05-01 ENCOUNTER — Emergency Department (HOSPITAL_BASED_OUTPATIENT_CLINIC_OR_DEPARTMENT_OTHER): Payer: 59

## 2019-05-01 ENCOUNTER — Encounter (HOSPITAL_BASED_OUTPATIENT_CLINIC_OR_DEPARTMENT_OTHER): Payer: Self-pay

## 2019-05-01 ENCOUNTER — Other Ambulatory Visit: Payer: Self-pay

## 2019-05-01 ENCOUNTER — Emergency Department (HOSPITAL_BASED_OUTPATIENT_CLINIC_OR_DEPARTMENT_OTHER)
Admission: EM | Admit: 2019-05-01 | Discharge: 2019-05-01 | Disposition: A | Discharge status: Home / Self Care | Payer: 59 | Attending: Emergency Medicine | Admitting: Emergency Medicine

## 2019-05-01 DIAGNOSIS — Z8673 Personal history of transient ischemic attack (TIA), and cerebral infarction without residual deficits: Secondary | ICD-10-CM | POA: Diagnosis not present

## 2019-05-01 DIAGNOSIS — K649 Unspecified hemorrhoids: Secondary | ICD-10-CM | POA: Diagnosis not present

## 2019-05-01 DIAGNOSIS — R1084 Generalized abdominal pain: Secondary | ICD-10-CM | POA: Diagnosis not present

## 2019-05-01 DIAGNOSIS — R140 Abdominal distension (gaseous): Secondary | ICD-10-CM | POA: Diagnosis not present

## 2019-05-01 DIAGNOSIS — Z888 Allergy status to other drugs, medicaments and biological substances status: Secondary | ICD-10-CM | POA: Diagnosis not present

## 2019-05-01 DIAGNOSIS — K625 Hemorrhage of anus and rectum: Secondary | ICD-10-CM

## 2019-05-01 DIAGNOSIS — Z79899 Other long term (current) drug therapy: Secondary | ICD-10-CM | POA: Diagnosis not present

## 2019-05-01 LAB — URINALYSIS, ROUTINE W REFLEX MICROSCOPIC
Bilirubin Urine: NEGATIVE
Glucose, UA: NEGATIVE mg/dL
Hgb urine dipstick: NEGATIVE
Ketones, ur: NEGATIVE mg/dL
Leukocytes,Ua: NEGATIVE
Nitrite: NEGATIVE
Protein, ur: NEGATIVE mg/dL
Specific Gravity, Urine: 1.01 (ref 1.005–1.030)
pH: 7 (ref 5.0–8.0)

## 2019-05-01 LAB — CBC WITH DIFFERENTIAL/PLATELET
Abs Immature Granulocytes: 0.04 10*3/uL (ref 0.00–0.07)
Basophils Absolute: 0.1 10*3/uL (ref 0.0–0.1)
Basophils Relative: 1 %
Eosinophils Absolute: 0.1 10*3/uL (ref 0.0–0.5)
Eosinophils Relative: 2 %
HCT: 44.1 % (ref 39.0–52.0)
Hemoglobin: 14.9 g/dL (ref 13.0–17.0)
Immature Granulocytes: 1 %
Lymphocytes Relative: 30 %
Lymphs Abs: 2.3 10*3/uL (ref 0.7–4.0)
MCH: 30 pg (ref 26.0–34.0)
MCHC: 33.8 g/dL (ref 30.0–36.0)
MCV: 88.7 fL (ref 80.0–100.0)
Monocytes Absolute: 0.6 10*3/uL (ref 0.1–1.0)
Monocytes Relative: 7 %
Neutro Abs: 4.7 10*3/uL (ref 1.7–7.7)
Neutrophils Relative %: 59 %
Platelets: 248 10*3/uL (ref 150–400)
RBC: 4.97 MIL/uL (ref 4.22–5.81)
RDW: 12.1 % (ref 11.5–15.5)
WBC: 7.9 10*3/uL (ref 4.0–10.5)
nRBC: 0 % (ref 0.0–0.2)

## 2019-05-01 LAB — COMPREHENSIVE METABOLIC PANEL
ALT: 19 U/L (ref 0–44)
AST: 22 U/L (ref 15–41)
Albumin: 3.8 g/dL (ref 3.5–5.0)
Alkaline Phosphatase: 52 U/L (ref 38–126)
Anion gap: 6 (ref 5–15)
BUN: 15 mg/dL (ref 6–20)
CO2: 25 mmol/L (ref 22–32)
Calcium: 8.8 mg/dL — ABNORMAL LOW (ref 8.9–10.3)
Chloride: 107 mmol/L (ref 98–111)
Creatinine, Ser: 1.02 mg/dL (ref 0.61–1.24)
GFR calc Af Amer: >60 mL/min (ref >60)
GFR calc non Af Amer: >60 mL/min (ref >60)
Glucose, Bld: 105 mg/dL — ABNORMAL HIGH (ref 70–99)
Potassium: 4.1 mmol/L (ref 3.5–5.1)
Sodium: 138 mmol/L (ref 135–145)
Total Bilirubin: 1.2 mg/dL (ref 0.3–1.2)
Total Protein: 6.9 g/dL (ref 6.5–8.1)

## 2019-05-01 LAB — LIPASE, BLOOD: Lipase: 31 U/L (ref 11–51)

## 2019-05-01 LAB — OCCULT BLOOD X 1 CARD TO LAB, STOOL: Fecal Occult Bld: NEGATIVE

## 2019-05-01 MED ORDER — SODIUM CHLORIDE 0.9 % IV BOLUS
1000.0000 mL | Freq: Once | INTRAVENOUS | Status: AC
  Administered 2019-05-01: 1000 mL via INTRAVENOUS

## 2019-05-01 MED ORDER — PSYLLIUM 58.6 % PO PACK: 1.0000 | PACK | Freq: Every day | ORAL | 0 refills | Status: DC

## 2019-05-01 MED ORDER — IOHEXOL 300 MG/ML  SOLN
100.0000 mL | Freq: Once | INTRAMUSCULAR | Status: AC | PRN
  Administered 2019-05-01: 100 mL via INTRAVENOUS

## 2019-05-01 MED ORDER — HYDROCORTISONE ACETATE 25 MG RE SUPP: 25.0000 mg | Freq: Two times a day (BID) | RECTAL | 0 refills | Status: DC

## 2019-05-01 NOTE — ED Triage Notes (Signed)
Pt arrives ambulatory to ED with c/o abdominal pain X3 weeks, pt also reports his BP has been elevated (BP 139/88 in triage), and has reports of some blood in his stool.

## 2019-05-01 NOTE — ED Provider Notes (Signed)
[facility] EMERGENCY DEPARTMENT Provider Note   CSN: [id number] Arrival date & time: 05/01/19  [id number]     History Chief Complaint  Patient presents with  . Abdominal Pain    Todd [name] is a 59 y.o. male.  Pt presents to the ED today with abdominal bloating and rectal bleeding.  Pt said this has been going on for a few weeks.  He has been to urgent care about it.  He has not followed up with GI.  Pt said he feels like his abdomen is getting bigger.  He has episodes on constipation and diarrhea, but mainly constipation.  He said he sometimes takes multiple trips to the restroom to relieve himself of all his stool.  He does not have a bm daily.  He has blood on his toilet paper every time he wipes.  He has seen Dr. Hilarie Fredrickson ([facility]) in the past, but not since May of 2016.  No f/c.  No n/v.  Pt never followed up for internal hemorrhoid banding.  Report from colonoscopy:  ENDOSCOPIC IMPRESSION: 1.   Sessile polyp was found in the transverse colon; polypectomy was performed with cold forceps; bleeding at the site was controlled using hemoclips 2.   Mild diverticulosis was noted in the ascending colon and at the hepatic flexure 3.   The examination was otherwise normal 4.   Mixed internal and external hemorrhoids and anal fissure (posterior)  RECOMMENDATIONS: 1.  Await pathology results 2.  Nitroglycerin ointment 0.125% applied 3 times daily to anal can now for anal fissure - 4-6 weeks 3.  Schedule hemorrhoidal banding for internal hemorrhoidal treatment 4.  Check PT and PTT  eSigned:  Jerene Bears, MD 08/08/2014 2:09 PM     cc: Tami Lin, MD and The Patient        Past Medical History:  Diagnosis Date  . Complicated migraine   . Diverticulosis   . External hemorrhoids   . Headache   . Hx of cardiovascular stress test    Myoview 8/10:  no ischemia, EF 62%; low risk  . Hx of Doppler ultrasound    Carotid US 6/15:  bilat ICA 1-39%  . Hx of  echocardiogram    echo 6/15:  EF 60-65%, no RWMA  . Internal hemorrhoids   . Palpitations   . Stroke ([place])   . TIA (transient ischemic attack)   . Tubular adenoma of colon   . Vision abnormalities     Patient Active Problem List   Diagnosis Date Noted  . Shortness of breath 11/12/2016  . Chest pain 11/12/2016  . Heart palpitations 10/20/2013  . Situational anxiety 10/20/2013  . Migraine with visual aura 08/24/2013  . TIA (transient ischemic attack) 08/23/2013  . Blurred vision 08/23/2013  . Anomia 08/23/2013    Past Surgical History:  Procedure Laterality Date  . APPENDECTOMY  12/01/11   lap appy  . LAPAROSCOPIC APPENDECTOMY  12/02/2011   Procedure: APPENDECTOMY LAPAROSCOPIC;  Surgeon: Madilyn Hook, DO;  Location: [facility];  Service: General;  Laterality: N/A;       Family History  Problem Relation Age of Onset  . Hypertension Mother   . Hypertension Father   . Anemia Neg Hx   . Arrhythmia Neg Hx   . Asthma Neg Hx   . Clotting disorder Neg Hx   . Fainting Neg Hx   . Heart attack Neg Hx   . Heart disease Neg Hx   . Heart failure Neg Hx   .  Hyperlipidemia Neg Hx   . Colon cancer Neg Hx     Social History   Tobacco Use  . Smoking status: Never Smoker  . Smokeless tobacco: Never Used  Substance Use Topics  . Alcohol use: No  . Drug use: No    Home Medications Prior to Admission medications   Medication Sig Start Date End Date Taking? Authorizing Provider  cloNIDine (CATAPRES) 0.1 MG tablet Take 1 tablet (0.1 mg total) by mouth 2 (two) times daily. Take 1 tablet for blood pressure greater than [id number] systolic and 95 diastolic [id number]   Wardell Honour, MD  cyclobenzaprine (FLEXERIL) 5 MG tablet Take 1-2 tablets (5-10 mg total) by mouth at bedtime as needed for muscle spasms. 04/13/19   Jaynee Eagles, PA-C  hydrocortisone (ANUSOL-HC) 25 MG suppository Place 1 suppository (25 mg total) rectally 2 (two) times daily. 05/01/19   Isla Pence, MD  magic mouthwash  w/lidocaine SOLN Take 5 mLs by mouth 3 (three) times daily as needed for mouth pain. 04/16/17   Forrest Moron, MD  psyllium (METAMUCIL) 58.6 % packet Take 1 packet by mouth daily. 05/01/19   Isla Pence, MD  senna-docusate (SENOKOT-S) 8.6-50 MG tablet Take 1 tablet by mouth 2 (two) times daily. Hold for diarrhea 04/18/19 05/18/19  Chase Picket, MD    Allergies    Wellbutrin [bupropion hcl]  Review of Systems   Review of Systems  Gastrointestinal: Positive for abdominal distention, blood in stool and constipation.  All other systems reviewed and are negative.   Physical Exam Updated Vital Signs BP 138/88 (BP Location: Right Arm)   Pulse 82   Temp 98.5 F (36.9 C) (Oral)   Resp 18   Ht 5\' 7"  (1.702 m)   Wt 76.2 kg   SpO2 98%   BMI 26.31 kg/m   Physical Exam Vitals and nursing note reviewed. Exam conducted with a chaperone present.  Constitutional:      Appearance: He is well-developed.  HENT:     Head: Normocephalic and atraumatic.     Mouth/Throat:     Mouth: Mucous membranes are moist.     Pharynx: Oropharynx is clear.  Eyes:     Extraocular Movements: Extraocular movements intact.     Pupils: Pupils are equal, round, and reactive to light.  Cardiovascular:     Rate and Rhythm: Normal rate and regular rhythm.  Pulmonary:     Effort: Pulmonary effort is normal.     Breath sounds: Normal breath sounds.  Abdominal:     General: Abdomen is flat. Bowel sounds are normal.     Palpations: Abdomen is soft.     Tenderness: There is abdominal tenderness.  Genitourinary:    Rectum: External hemorrhoid present.  Skin:    General: Skin is warm.     Capillary Refill: Capillary refill takes less than 2 seconds.  Neurological:     General: No focal deficit present.     Mental Status: He is alert and oriented to person, place, and time.  Psychiatric:        Mood and Affect: Mood normal.        Behavior: Behavior normal.     ED Results / Procedures / Treatments     Labs (all labs ordered are listed, but only abnormal results are displayed) Labs Reviewed  COMPREHENSIVE METABOLIC PANEL - Abnormal; Notable for the following components:      Result Value   Glucose, Bld 105 (*)    Calcium 8.8 (*)  All other components within normal limits  CBC WITH DIFFERENTIAL/PLATELET  LIPASE, BLOOD  URINALYSIS, ROUTINE W REFLEX MICROSCOPIC  OCCULT BLOOD X 1 CARD TO LAB, STOOL  POC OCCULT BLOOD, ED    EKG None  Radiology CT ABDOMEN PELVIS W CONTRAST  Result Date: 05/01/2019 CLINICAL DATA:  Acute generalized abdominal pain, bloating. EXAM: CT ABDOMEN AND PELVIS WITH CONTRAST TECHNIQUE: Multidetector CT imaging of the abdomen and pelvis was performed using the standard protocol following bolus administration of intravenous contrast. CONTRAST:  126mL OMNIPAQUE IOHEXOL 300 MG/ML  SOLN COMPARISON:  December 02, 2011. FINDINGS: Lower chest: No acute abnormality. Hepatobiliary: No focal liver abnormality is seen. No gallstones, gallbladder wall thickening, or biliary dilatation. Pancreas: Unremarkable. No pancreatic ductal dilatation or surrounding inflammatory changes. Spleen: Normal in size without focal abnormality. Adrenals/Urinary Tract: Adrenal glands are unremarkable. Kidneys are normal, without renal calculi, focal lesion, or hydronephrosis. Bladder is unremarkable. Stomach/Bowel: The stomach appears normal. There is no evidence of bowel obstruction or inflammation. Status post appendectomy. Vascular/Lymphatic: No significant vascular findings are present. No enlarged abdominal or pelvic lymph nodes. Reproductive: Prostate is unremarkable. Other: No abdominal wall hernia or abnormality. No abdominopelvic ascites. Musculoskeletal: No acute or significant osseous findings. IMPRESSION: No acute abnormality seen in the abdomen or pelvis. Electronically Signed   By: Marijo Conception M.D.   On: 05/01/2019 11:27    Procedures Procedures (including critical care  time)  Medications Ordered in ED Medications  sodium chloride 0.9 % bolus 1,000 mL (1,000 mLs Intravenous New Bag/Given 05/01/19 1011)  iohexol (OMNIPAQUE) 300 MG/ML solution 100 mL (100 mLs Intravenous Contrast Given 05/01/19 1053)    ED Course  I have reviewed the triage vital signs and the nursing notes.  Pertinent labs & imaging results that were available during my care of the patient were reviewed by me and considered in my medical decision making (see chart for details).    MDM Rules/Calculators/A&P                      Rectal bleeding is likely due to hemorrhoids.  The pt will be put on anusol.  Pt encouraged to eat increased fiber.  He is given a rx for metamucil as well.  He is told to f/u with GI.   Final Clinical Impression(s) / ED Diagnoses Final diagnoses:  Hemorrhoids, unspecified hemorrhoid type  Rectal bleeding    Rx / DC Orders ED Discharge Orders         Ordered    psyllium (METAMUCIL) 58.6 % packet  Daily     05/01/19 1138    hydrocortisone (ANUSOL-HC) 25 MG suppository  2 times daily     05/01/19 [place], Anedra Penafiel, MD 05/01/19 1157

## 2019-05-01 NOTE — ED Notes (Signed)
Patient attempted urine sample, unable to obtain.

## 2019-05-01 NOTE — ED Notes (Signed)
ED Provider at bedside. 

## 2019-05-02 ENCOUNTER — Encounter: Payer: Self-pay | Admitting: Nurse Practitioner

## 2019-05-16 ENCOUNTER — Other Ambulatory Visit: Payer: Self-pay

## 2019-05-16 ENCOUNTER — Ambulatory Visit (INDEPENDENT_AMBULATORY_CARE_PROVIDER_SITE_OTHER): Payer: 59 | Admitting: Nurse Practitioner

## 2019-05-16 ENCOUNTER — Encounter: Payer: Self-pay | Admitting: Nurse Practitioner

## 2019-05-16 VITALS — BP 110/70 | HR 63 | Temp 97.0°F | Ht 67.0 in | Wt 165.0 lb

## 2019-05-16 DIAGNOSIS — K5909 Other constipation: Secondary | ICD-10-CM

## 2019-05-16 DIAGNOSIS — K625 Hemorrhage of anus and rectum: Secondary | ICD-10-CM | POA: Diagnosis not present

## 2019-05-16 DIAGNOSIS — K649 Unspecified hemorrhoids: Secondary | ICD-10-CM

## 2019-05-16 NOTE — Patient Instructions (Addendum)
If you are age 57 or older, your body mass index should be between 23-30. Your Body mass index is 25.84 kg/m. If this is out of the aforementioned range listed, please consider follow up with your Primary Care Provider.  If you are age 10 or younger, your body mass index should be between 19-25. Your Body mass index is 25.84 kg/m. If this is out of the aformentioned range listed, please consider follow up with your Primary Care Provider.   Increase water to 64 ounces daily.  Start Citrucel daily.  Follow up with Dr. Hilarie Fredrickson for hemorrhoid banding on 05/24/19 at 4 pm.  Thank you for choosing me and [facility].   Tye Savoy, NP

## 2019-05-16 NOTE — Progress Notes (Addendum)
ASSESSMENT / PLAN:   #Chronic intermittent rectal bleeding (at least 15 years).   -ED visit early this month for bloating / abdominal pain. Labs and CT scan unremarkable.  -Variable but short-lived response to topical steroids over the years. -Patient interested in surgical intervention -He does have known internal and external hemorrhoids demonstrated on last colonoscopy.  I explained that we can band internal hemorrhoids which should help, probably alleviate the intermittent bleeding but are unable to band external hemorrhoids.  He would like to proceed. Brochure on internal hemorrhoid banding given.  Will schedule him for banding with Dr. Hilarie Fredrickson -It is imperative that constipation is addressed  # Chronic intermittent constipation.  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  -Increase water intake to 64 ounces daily. -Start daily Citrucel.  He has taken it sporadically in the past.  I asked him to take this on a daily basis going forward   # History of colon polyps.   Small adenoma removed at time of last screening colonoscopy May 2021.  Based on new guidelines patient will be placed on 7-year recall making next colonoscopy due May 2028.'s was discussed with the patient  Addendum: Correction - 7 year recall should be 2023, not 2028.   HPI:     Chief Complaint:   hemorrhoids  Todd [name] is a 59 y.o. male here following ED visit for hemorrhoidal bleeding / bloating.  Patient gives a 15 or so year history of intermittent hemorrhoidal bleeding.  After bowel movements hemorrhoids protrude out making it difficult for him to walk.  The bleeding is sometimes so heavy it pours down his legs. He has used topical creams in the past with variable but short-lived results.  He has chronic intermittent constipation.  In the ED on 05/01/2019 his labs including a CMET and CBC were  unremarkable.  He was given a prescription for Anusol cream twice daily.  Patient has not used the cream.  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  He asked about surgery for correction of the hemorrhoids. No abdominal pain, nausea, or other GI complaints.    Data Reviewed:  ED visit 05/01/19 CBC, CMET - ok  CT scan w/ contrast - no acute findings  May 2016 screening colonoscopy  Diverticulosis, 2 mm tubular adenoma Internal / external hemorrhoids  Posterior midline fissure   Past Medical History:  Diagnosis Date  . Complicated migraine   . Diverticulosis   . External hemorrhoids   . Headache   . Hx of cardiovascular stress test    Myoview 8/10:  no ischemia, EF 62%; low risk  . Hx of Doppler ultrasound    Carotid US 6/15:  bilat ICA 1-39%  . Hx of echocardiogram    echo 6/15:  EF 60-65%, no RWMA  . Internal hemorrhoids   . Palpitations   . Stroke ([place])   . TIA (transient ischemic attack)   . Tubular adenoma of colon   . Vision abnormalities      Past Surgical History:  Procedure Laterality Date  . APPENDECTOMY  12/01/11   lap appy  . LAPAROSCOPIC APPENDECTOMY  12/02/2011   Procedure: APPENDECTOMY LAPAROSCOPIC;  Surgeon: Madilyn Hook, DO;  Location: [facility];  Service: General;  Laterality: N/A;   Family History  Problem Relation Age of Onset  . Hypertension Mother   .  Hypertension Father   . Anemia Neg Hx   . Arrhythmia Neg Hx   . Asthma Neg Hx   . Clotting disorder Neg Hx   . Fainting Neg Hx   . Heart attack Neg Hx   . Heart disease Neg Hx   . Heart failure Neg Hx   . Hyperlipidemia Neg Hx   . Colon cancer Neg Hx    Social History   Tobacco Use  . Smoking status: Never Smoker  . Smokeless tobacco: Never Used  Substance Use Topics  . Alcohol use: No  . Drug use: No   Current Outpatient Medications  Medication Sig Dispense Refill  . hydrocortisone (ANUSOL-HC) 25 MG suppository Place 1 suppository (25 mg  total) rectally 2 (two) times daily. 12 suppository 0   No current facility-administered medications for this visit.   Allergies  Allergen Reactions  . Wellbutrin [Bupropion Hcl] Itching     Review of Systems: All systems reviewed and negative except where noted in HPI.   Serum creatinine: 1.02 mg/dL 05/01/19 1003 Estimated creatinine clearance: 74.7 mL/min   Physical Exam:    Wt Readings from Last 3 Encounters:  05/16/19 165 lb (74.8 kg)  05/01/19 168 lb (76.2 kg)  04/19/19 168 lb (76.2 kg)    BP 110/70   Pulse 63   Temp (!) 97 F (36.1 C)   Ht 5\' 7"  (1.702 m)   Wt 165 lb (74.8 kg)   BMI 25.84 kg/m  Constitutional:  Pleasant male in no acute distress. Psychiatric: Normal mood and affect. Behavior is normal. EENT: Pupils normal.  Conjunctivae are normal. No scleral icterus. Neck supple.  Cardiovascular: Normal rate, regular rhythm. No edema Pulmonary/chest: Effort normal and breath sounds normal. No wheezing, rales or rhonchi. Abdominal: Soft, nondistended, nontender. Bowel sounds active throughout. There are no masses palpable. No hepatomegaly. Neurological: Alert and oriented to person place and time. Skin: Skin is warm and dry. No rashes noted.  Tye Savoy, NP  05/16/2019, 9:11 AM

## 2019-05-16 NOTE — Progress Notes (Signed)
Addendum: Reviewed and agree with assessment and management plan. Of note, the recall should be 2023 (not 2028); 7 years from last examination Thanks Earla Charlie, Lajuan Lines, MD

## 2019-05-20 ENCOUNTER — Encounter: Payer: Self-pay | Admitting: *Deleted

## 2019-05-24 ENCOUNTER — Other Ambulatory Visit (INDEPENDENT_AMBULATORY_CARE_PROVIDER_SITE_OTHER): Payer: 59

## 2019-05-24 ENCOUNTER — Encounter: Payer: Self-pay | Admitting: Internal Medicine

## 2019-05-24 ENCOUNTER — Ambulatory Visit (INDEPENDENT_AMBULATORY_CARE_PROVIDER_SITE_OTHER): Payer: 59 | Admitting: Internal Medicine

## 2019-05-24 VITALS — BP 100/70 | HR 72 | Temp 97.3°F | Ht 67.0 in | Wt 167.0 lb

## 2019-05-24 DIAGNOSIS — R194 Change in bowel habit: Secondary | ICD-10-CM

## 2019-05-24 DIAGNOSIS — Z01818 Encounter for other preprocedural examination: Secondary | ICD-10-CM

## 2019-05-24 DIAGNOSIS — K625 Hemorrhage of anus and rectum: Secondary | ICD-10-CM | POA: Diagnosis not present

## 2019-05-24 DIAGNOSIS — K5900 Constipation, unspecified: Secondary | ICD-10-CM

## 2019-05-24 DIAGNOSIS — K649 Unspecified hemorrhoids: Secondary | ICD-10-CM

## 2019-05-24 LAB — CBC WITH DIFFERENTIAL/PLATELET
Basophils Absolute: 0.1 10*3/uL (ref 0.0–0.1)
Basophils Relative: 0.7 % (ref 0.0–3.0)
Eosinophils Absolute: 0.2 10*3/uL (ref 0.0–0.7)
Eosinophils Relative: 1.6 % (ref 0.0–5.0)
HCT: 46.5 % (ref 39.0–52.0)
Hemoglobin: 15.7 g/dL (ref 13.0–17.0)
Lymphocytes Relative: 25.8 % (ref 12.0–46.0)
Lymphs Abs: 2.6 10*3/uL (ref 0.7–4.0)
MCHC: 33.9 g/dL (ref 30.0–36.0)
MCV: 88.2 fl (ref 78.0–100.0)
Monocytes Absolute: 0.6 10*3/uL (ref 0.1–1.0)
Monocytes Relative: 6.2 % (ref 3.0–12.0)
Neutro Abs: 6.6 10*3/uL (ref 1.4–7.7)
Neutrophils Relative %: 65.7 % (ref 43.0–77.0)
Platelets: 270 10*3/uL (ref 150.0–400.0)
RBC: 5.27 Mil/uL (ref 4.22–5.81)
RDW: 12.9 % (ref 11.5–15.5)
WBC: 10 10*3/uL (ref 4.0–10.5)

## 2019-05-24 MED ORDER — SUPREP BOWEL PREP KIT 17.5-3.13-1.6 GM/177ML PO SOLN: 1.0000 | ORAL | 0 refills | Status: DC

## 2019-05-24 NOTE — Progress Notes (Signed)
Subjective:    Patient ID: Todd [name], male    DOB: Mar 02, 1961, 59 y.o.   MRN: [id number]  HPI Todd [name] is a 59 year old male with a history of small tubular adenoma in May 2016, history of internal and external hemorrhoids with bleeding who is seen for follow-up.  He is here alone today and was recently seen by Tye Savoy, NP on 05/16/2019.  He has had ongoing problems with intermittent bleeding on and off which can be quite voluminous.  He attributes this to hemorrhoids.  He has to put pressure to the perianal skin at times and lie on his stomach to get the bleeding to stop.  He is also noticed a change in his bowel habits with incomplete bowel movements 4-6 times per day whereas previously he would go 1 or 2 times per day with more complete bowel movement.  This is despite increasing water and vegetables.  He did not start supplemental fiber.  This is been going on for about a year.  His hemorrhoid symptoms date back more than 20 years.  He does feel it hard to produce a stool and need to strain.  He spends more than 2 or 3 minutes on the toilet.  He does have rectal bleeding, anal pain, anal burning and at times leakage from the rectum.  There is no anal itching.  He has never had prior hemorrhoid treatment other than topical therapy   Review of Systems As per HPI, otherwise negative  Current Medications, Allergies, Past Medical History, Past Surgical History, Family History and Social History were reviewed in [employer] record.     Objective:   Physical Exam BP 100/70   Pulse 72   Temp (!) 97.3 F (36.3 C)   Ht 5\' 7"  (1.702 m)   Wt 167 lb (75.8 kg)   BMI 26.16 kg/m  Gen: awake, alert, NAD Neuro: nonfocal       Assessment & Plan:  59 year old male with a history of small tubular adenoma in May 2016, history of internal and external hemorrhoids with bleeding who is seen for follow-up.   1.  Hemorrhoids/rectal bleeding/change in bowel habit --we  had scheduled this visit to consider hemorrhoidal banding however given his change in bowel habits in addition to the rectal bleeding symptoms I recommended we repeat colonoscopy to ensure no other pathology.  This will also allow for further evaluation of the hemorrhoids at the time of colonoscopy and that we can best decide whether he would be a good candidate for hemorrhoidal band therapy versus traditional surgical hemorrhoidectomy.  We discussed this at length today.  He is agreeable to proceeding with colonoscopy first after discussing the risk, benefits and alternatives.  Of note he did have more than expected oozing from a very small polypectomy site requiring hemostatic clip at the time of colonoscopy.  He also notes significant bleeding at home after bowel movement and just to ensure normal coagulation I am going to check PT and PTT today.  I am also checking CBC to ensure no anemia --CBC, PT and PTT --Colonoscopy  2.  History of adenomatous polyp --small adenoma which would warrant a 7-year surveillance which would be 2023 however given change in bowel habits we are proceeding at this time, see #1  3.  Constipation --after colonoscopy I do think he would benefit from Benefiber or Metamucil on a regular basis.  We discussed this today  30 minutes total spent today including patient facing time, coordination of  care, reviewing medical history/procedures/pertinent radiology studies, and documentation of the encounter.

## 2019-05-24 NOTE — Patient Instructions (Signed)
Your provider has requested that you go to the basement level for lab work before leaving today. Press "B" on the elevator. The lab is located at the first door on the left as you exit the elevator. __________________________________________________ Todd [name] have been scheduled for a colonoscopy. Please follow written instructions given to you at your visit today.  Please pick up your prep supplies at the pharmacy within the next 1-3 days. If you use inhalers (even only as needed), please bring them with you on the day of your procedure. Your physician has requested that you go to www.startemmi.com and enter the access code given to you at your visit today. This web site gives a general overview about your procedure. However, you should still follow specific instructions given to you by our office regarding your preparation for the procedure. _____________________________________________________ If you are age 58 or older, your body mass index should be between 23-30. Your Body mass index is 26.16 kg/m. If this is out of the aforementioned range listed, please consider follow up with your Primary Care Provider.  If you are age 18 or younger, your body mass index should be between 19-25. Your Body mass index is 26.16 kg/m. If this is out of the aformentioned range listed, please consider follow up with your Primary Care Provider.  _____________________________________________________ Due to recent changes in healthcare laws, you may see the results of your imaging and laboratory studies on MyChart before your provider has had a chance to review them.  We understand that in some cases there may be results that are confusing or concerning to you. Not all laboratory results come back in the same time frame and the provider may be waiting for multiple results in order to interpret others.  Please give [place] 48 hours in order for your provider to thoroughly review all the results before contacting the office for  clarification of your results.

## 2019-05-25 LAB — PROTIME-INR
INR: 0.9 ratio (ref 0.8–1.0)
Prothrombin Time: 10.5 s (ref 9.6–13.1)

## 2019-05-25 LAB — APTT: aPTT: 34 s — ABNORMAL HIGH (ref 23.4–32.7)

## 2019-05-26 ENCOUNTER — Other Ambulatory Visit: Payer: Self-pay

## 2019-05-26 DIAGNOSIS — R791 Abnormal coagulation profile: Secondary | ICD-10-CM

## 2019-06-02 ENCOUNTER — Telehealth: Payer: Self-pay | Admitting: Hematology

## 2019-06-02 NOTE — Telephone Encounter (Signed)
Pt returned call to get appt scheduled, confirmed 3/31 appt. Pt aware to arrive 15-82mins early and to bring insurance card with photo ID. Mailing printout

## 2019-06-18 ENCOUNTER — Other Ambulatory Visit: Payer: Self-pay

## 2019-06-18 ENCOUNTER — Emergency Department (HOSPITAL_BASED_OUTPATIENT_CLINIC_OR_DEPARTMENT_OTHER)
Admission: EM | Admit: 2019-06-18 | Discharge: 2019-06-19 | Disposition: A | Discharge status: Home / Self Care | Payer: 59 | Attending: Emergency Medicine | Admitting: Emergency Medicine

## 2019-06-18 ENCOUNTER — Encounter (HOSPITAL_BASED_OUTPATIENT_CLINIC_OR_DEPARTMENT_OTHER): Payer: Self-pay | Admitting: *Deleted

## 2019-06-18 DIAGNOSIS — R410 Disorientation, unspecified: Secondary | ICD-10-CM | POA: Diagnosis not present

## 2019-06-18 DIAGNOSIS — T407X5A Adverse effect of cannabis (derivatives), initial encounter: Secondary | ICD-10-CM | POA: Diagnosis not present

## 2019-06-18 DIAGNOSIS — F419 Anxiety disorder, unspecified: Secondary | ICD-10-CM | POA: Diagnosis not present

## 2019-06-18 DIAGNOSIS — T407X4A Poisoning by cannabis (derivatives), undetermined, initial encounter: Secondary | ICD-10-CM | POA: Diagnosis present

## 2019-06-18 DIAGNOSIS — T40715A Adverse effect of cannabis, initial encounter: Secondary | ICD-10-CM

## 2019-06-18 NOTE — ED Triage Notes (Signed)
EMS transport from home. States a friend gave him a CBD edible approx 1 hour pta. Pt states he feels anxious and disoriented

## 2019-06-19 LAB — RAPID URINE DRUG SCREEN, HOSP PERFORMED
Amphetamines: NOT DETECTED
Barbiturates: NOT DETECTED
Benzodiazepines: NOT DETECTED
Cocaine: NOT DETECTED
Opiates: NOT DETECTED
Tetrahydrocannabinol: POSITIVE — AB

## 2019-06-19 MED ORDER — DIAZEPAM 5 MG PO TABS: 10.0000 mg | ORAL_TABLET | Freq: Once | ORAL | Status: DC

## 2019-06-19 NOTE — ED Provider Notes (Signed)
[facility] Provider Note: Georgena Spurling, MD, FACEP  CSN: [id number] MRN: [id number] ARRIVAL: 06/18/19 at 2207 ROOM: MH07/MH07   CHIEF COMPLAINT  Ingestion (THC edible)   HISTORY OF PRESENT ILLNESS  06/19/19 1:50 AM Todd [name] is a 59 y.o. male who ate a piece of candy like food given to him by a friend about an hour prior to arrival. He subsequently became anxious and disoriented with waves of strange thoughts.  He sometimes feels like he is dreaming.  He sometimes feels like he does not know if he is between life and death.  He has had episodes of dizziness by which he means the room spinning.  He was told the fluid contained CBD but he is not sure of this.  Symptoms were moderate to severe but have improved since arrival without treatment.  The sensations have been very dysphoric to him.  Past Medical History:  Diagnosis Date  . Complicated migraine   . Diverticulosis   . External hemorrhoids   . External hemorrhoids   . Headache   . Hx of cardiovascular stress test    Myoview 8/10:  no ischemia, EF 62%; low risk  . Hx of Doppler ultrasound    Carotid US 6/15:  bilat ICA 1-39%  . Hx of echocardiogram    echo 6/15:  EF 60-65%, no RWMA  . Internal hemorrhoids   . Palpitations   . Stroke ([place])   . TIA (transient ischemic attack)   . Tubular adenoma of colon   . Vision abnormalities     Past Surgical History:  Procedure Laterality Date  . APPENDECTOMY  12/01/11   lap appy  . LAPAROSCOPIC APPENDECTOMY  12/02/2011   Procedure: APPENDECTOMY LAPAROSCOPIC;  Surgeon: Madilyn Hook, DO;  Location: [facility];  Service: General;  Laterality: N/A;    Family History  Problem Relation Age of Onset  . Hypertension Mother   . Hypertension Father   . Anemia Neg Hx   . Arrhythmia Neg Hx   . Asthma Neg Hx   . Clotting disorder Neg Hx   . Fainting Neg Hx   . Heart attack Neg Hx   . Heart disease Neg Hx   . Heart failure Neg Hx   . Hyperlipidemia Neg Hx   . Colon cancer  Neg Hx     Social History   Tobacco Use  . Smoking status: Never Smoker  . Smokeless tobacco: Never Used  Substance Use Topics  . Alcohol use: No  . Drug use: No    Prior to Admission medications   Not on File    Allergies Wellbutrin [bupropion hcl]   REVIEW OF SYSTEMS  Negative except as noted here or in the History of Present Illness.   PHYSICAL EXAMINATION  Initial Vital Signs Blood pressure 117/64, pulse 81, temperature 98 F (36.7 C), temperature source Oral, resp. rate 16, height 5\' 7"  (1.702 m), weight 77.1 kg, SpO2 97 %.  Examination General: Well-developed, well-nourished male in no acute distress; appearance consistent with age of record HENT: normocephalic; atraumatic Eyes: pupils equal, round and reactive to light; extraocular muscles intact Neck: supple Heart: regular rate and rhythm Lungs: clear to auscultation bilaterally Abdomen: soft; nondistended; nontender; bowel sounds present Extremities: No deformity; full range of motion; pulses normal Neurologic: Awake but drowsy appearing, oriented to person, place, year, month but not day of week; motor function intact in all extremities and symmetric; no facial droop Skin: Warm and dry Psychiatric: Flat affect; anxious  RESULTS  Summary of this visit's results, reviewed and interpreted by myself:   EKG Interpretation  Date/Time:    Ventricular Rate:    PR Interval:    QRS Duration:   QT Interval:    QTC Calculation:   R Axis:     Text Interpretation:        Laboratory Studies: Results for orders placed or performed during the hospital encounter of 06/18/19 (from the past 24 hour(s))  Rapid urine drug screen (hospital performed)     Status: Abnormal   Collection Time: 06/19/19  3:15 AM  Result Value Ref Range   Opiates NONE DETECTED NONE DETECTED   Cocaine NONE DETECTED NONE DETECTED   Benzodiazepines NONE DETECTED NONE DETECTED   Amphetamines NONE DETECTED NONE DETECTED    Tetrahydrocannabinol POSITIVE (A) NONE DETECTED   Barbiturates NONE DETECTED NONE DETECTED   Imaging Studies: No results found.  ED COURSE and MDM  Nursing notes, initial and subsequent vitals signs, including pulse oximetry, reviewed and interpreted by myself.  Vitals:   06/18/19 2231 06/18/19 2250 06/19/19 0027 06/19/19 0316  BP:   117/64 (!) 100/55  Pulse: 92 (!) 104 81 85  Resp:   16 20  Temp:      TempSrc:      SpO2: 96% 100% 97% 100%  Weight:      Height:       Medications  diazepam (VALIUM) tablet 10 mg (has no administration in time range)    Patient's drug screen is positive for marijuana.  He states he had never in the past used marijuana or other drugs of abuse.  He clearly had an adverse reaction.  We will treat him with Valium.  PROCEDURES  Procedures   ED DIAGNOSES     ICD-10-CM   1. Adverse effect of cannabis, initial encounter  T40.7X5A        Zanaria Morell, Jenny Reichmann, MD 06/19/19 [phone]

## 2019-06-21 NOTE — Progress Notes (Signed)
HEMATOLOGY/ONCOLOGY CONSULTATION NOTE  Date of Service: 06/22/2019  Patient Care Team: Patient, No Pcp Per as PCP - General (General Practice)  REFERRING PHYSICIAN: Pyrtle, Lajuan Lines, MD  CHIEF COMPLAINTS/PURPOSE OF CONSULTATION:  Abnormal Bleeding/Elevated PTT   HISTORY OF PRESENTING ILLNESS:   Todd [name] is a wonderful 59 y.o. male who has been referred to [place] by Pyrtle, Lajuan Lines, MD for evaluation and management of Abnormal Bleeding. The pt reports that he is doing well overall.   The pt reports he is good. He has not had excessive bleeding with any surgery's. Pt has had 6-8 teeth removed, and an appendectomy. He has had a colonoscopy about 5 years ago. Pt is going to get another colonoscopy for his hemorrhoids on April 12th. He does drink tea and takes a multi-vitamin. Pt has had stroke like symptoms where he see's white spots, and memory problems. The white spots he has been seeing for about 3 years.   Of note prior to the patient's visit today, pt has had CT A/P w Contrast ([id number]) completed on 05/01/19 with results revealing No acute abnormality seen in the abdomen or pelvis.   Most recent lab results (05/24/19) of CBC is as follows: all values are WNL 05/24/19 of PTT: aPTT at 34 05/24/19 of INR/PT: all values are WNL  On review of systems, pt reports blood in urine and stool and denies fever, chills, night sweats, unexpected weight loss, abnormal bleeding, sudden nose bleeds, skin bleeds, abdominal pain, skin rashes, joint pain, swelling and any other symptoms.   On PMHx the pt reports appendectomy, hemorrhoids  On Social Hx pt reports no smoking or alcohol use   On Family Hx the pt reports no blood disorders   MEDICAL HISTORY:  Past Medical History:  Diagnosis Date  . Complicated migraine   . Diverticulosis   . External hemorrhoids   . External hemorrhoids   . Headache   . Hx of cardiovascular stress test    Myoview 8/10:  no ischemia, EF 62%; low risk  . Hx of  Doppler ultrasound    Carotid US 6/15:  bilat ICA 1-39%  . Hx of echocardiogram    echo 6/15:  EF 60-65%, no RWMA  . Internal hemorrhoids   . Palpitations   . Stroke ([place])   . TIA (transient ischemic attack)   . Tubular adenoma of colon   . Vision abnormalities      SURGICAL HISTORY: Past Surgical History:  Procedure Laterality Date  . APPENDECTOMY  12/01/11   lap appy  . LAPAROSCOPIC APPENDECTOMY  12/02/2011   Procedure: APPENDECTOMY LAPAROSCOPIC;  Surgeon: Madilyn Hook, DO;  Location: [facility];  Service: General;  Laterality: N/A;     SOCIAL HISTORY: Social History   Socioeconomic History  . Marital status: Married    Spouse name: Not on file  . Number of children: Not on file  . Years of education: Not on file  . Highest education level: Not on file  Occupational History  . Not on file  Tobacco Use  . Smoking status: Never Smoker  . Smokeless tobacco: Never Used  Substance and Sexual Activity  . Alcohol use: No  . Drug use: No  . Sexual activity: Not Currently  Other Topics Concern  . Not on file  Social History Narrative  . Not on file   Social Determinants of Health   Financial Resource Strain:   . Difficulty of Paying Living Expenses:   Food Insecurity:   . Worried  About Running Out of Food in the Last Year:   . [place] in the Last Year:   Transportation Needs:   . Lack of Transportation (Medical):   Marland Kitchen Lack of Transportation (Non-Medical):   Physical Activity:   . Days of Exercise per Week:   . Minutes of Exercise per Session:   Stress:   . Feeling of Stress :   Social Connections:   . Frequency of Communication with Friends and Family:   . Frequency of Social Gatherings with Friends and Family:   . Attends Religious Services:   . Active Member of Clubs or Organizations:   . Attends Archivist Meetings:   Marland Kitchen Marital Status:   Intimate Partner Violence:   . Fear of Current or Ex-Partner:   . Emotionally Abused:   Marland Kitchen Physically  Abused:   . Sexually Abused:      FAMILY HISTORY: Family History  Problem Relation Age of Onset  . Hypertension Mother   . Hypertension Father   . Anemia Neg Hx   . Arrhythmia Neg Hx   . Asthma Neg Hx   . Clotting disorder Neg Hx   . Fainting Neg Hx   . Heart attack Neg Hx   . Heart disease Neg Hx   . Heart failure Neg Hx   . Hyperlipidemia Neg Hx   . Colon cancer Neg Hx      ALLERGIES:   is allergic to bupropion and wellbutrin [bupropion hcl].   MEDICATIONS:  Current Outpatient Medications  Medication Sig Dispense Refill  . Multiple Vitamin (MULTIVITAMIN ADULT PO) Take 1 tablet by mouth daily.    . hydrOXYzine (ATARAX/VISTARIL) 25 MG tablet Take 25 mg by mouth every 6 (six) hours as needed.    Marland Kitchen levocetirizine (XYZAL) 5 MG tablet Take by mouth.     No current facility-administered medications for this visit.     REVIEW OF SYSTEMS:   A 10+ POINT REVIEW OF SYSTEMS WAS OBTAINED including neurology, dermatology, psychiatry, cardiac, respiratory, lymph, extremities, GI, GU, Musculoskeletal, constitutional, breasts, reproductive, HEENT.  All pertinent positives are noted in the HPI.  All others are negative.    PHYSICAL EXAMINATION: ECOG PERFORMANCE STATUS: 1 - Symptomatic but completely ambulatory  Vitals:   06/22/19 1044  BP: 123/78  Pulse: 65  Resp: 18  Temp: 97.8 F (36.6 C)  SpO2: 100%   Filed Weights   06/22/19 1044  Weight: 165 lb 1.6 oz (74.9 kg)   Body mass index is 25.86 kg/m.  GENERAL:alert, in no acute distress and comfortable SKIN: no acute rashes, no significant lesions EYES: conjunctiva are pink and non-injected, sclera anicteric OROPHARYNX: MMM, no exudates, no oropharyngeal erythema or ulceration NECK: supple, no JVD LYMPH:  no palpable lymphadenopathy in the cervical, axillary or inguinal regions LUNGS: clear to auscultation b/l with normal respiratory effort HEART: regular rate & rhythm ABDOMEN:  normoactive bowel sounds , non tender,  not distended. Extremity: no pedal edema PSYCH: alert & oriented x 3 with fluent speech NEURO: no focal motor/sensory deficits   LABORATORY DATA:  I have reviewed the data as listed  CBC Latest Ref Rng & Units 05/24/2019 05/01/2019 02/26/2019  WBC 4.0 - 10.5 K/uL 10.0 7.9 7.9  Hemoglobin 13.0 - 17.0 g/dL 15.7 14.9 14.6  Hematocrit 39.0 - 52.0 % 46.5 44.1 43.6  Platelets 150.0 - 400.0 K/uL 270.0 248 223    CMP Latest Ref Rng & Units 05/01/2019 02/26/2019 07/16/2018  Glucose 70 - 99 mg/dL 105(H) 144(H)  90  BUN 6 - 20 mg/dL 15 17 13   Creatinine 0.61 - 1.24 mg/dL 1.02 0.88 0.91  Sodium 135 - 145 mmol/L 138 138 144  Potassium 3.5 - 5.1 mmol/L 4.1 3.7 4.2  Chloride 98 - 111 mmol/L 107 108 110  CO2 22 - 32 mmol/L 25 23 26   Calcium 8.9 - 10.3 mg/dL 8.8(L) 8.8(L) 9.3  Total Protein 6.5 - 8.1 g/dL 6.9 - -  Total Bilirubin 0.3 - 1.2 mg/dL 1.2 - -  Alkaline Phos 38 - 126 U/L 52 - -  AST 15 - 41 U/L 22 - -  ALT 0 - 44 U/L 19 - -    Component     Latest Ref Rng & Units 05/24/2019  INR     0.8 - 1.0 ratio 0.9  Prothrombin Time     9.6 - 13.1 sec 10.5  APTT     23.4 - 32.7 SEC 34.0 (H)    RADIOGRAPHIC STUDIES: I have personally reviewed the radiological images as listed and agreed with the findings in the report. No results found.   ASSESSMENT & PLAN:  Xabi Weipert is a 59 y.o. male with:  1. Abnormal PTT PLAN: -Discussed patient's most recent labs from 05/24/19, of CBC is as follows: all values are WNL 05/24/19 of PTT: aPTT at 34 05/24/19 of INR/PT: all values are WNL -Discussed bleeding disorders/ abnormal blood clots -Educated on how the body stops bleeding- clotting factors -Advised on clotting factors -Advised on borderline abnormal apTT results  -Recommends repeat blood test for abnormal clotting factors as noted below -F/u with PCP  FOLLOW UP Labs today Phone visit in 10 days with Dr Irene Limbo  . Orders Placed This Encounter  Procedures  . APTT    Standing Status:   Future     Number of Occurrences:   1    Standing Expiration Date:   06/21/2020  . Protime-INR    Standing Status:   Future    Number of Occurrences:   1    Standing Expiration Date:   06/21/2020  . Lupus anticoagulant panel    Standing Status:   Future    Number of Occurrences:   1    Standing Expiration Date:   07/26/2020  . Factor 9 assay    Standing Status:   Future    Number of Occurrences:   1    Standing Expiration Date:   06/21/2020  . Factor 11 assay    Standing Status:   Future    Number of Occurrences:   1    Standing Expiration Date:   06/21/2020  . Factor 12 assay    Standing Status:   Future    Number of Occurrences:   1    Standing Expiration Date:   06/21/2020  . Platelet function assay    Standing Status:   Future    Number of Occurrences:   1    Standing Expiration Date:   06/21/2020  . Fibrinogen    Standing Status:   Future    Number of Occurrences:   1    Standing Expiration Date:   06/21/2020  . Ptt factor inhibitor (mixing study)    Standing Status:   Future    Number of Occurrences:   1    Standing Expiration Date:   06/21/2020  . Von Willebrand panel    Standing Status:   Future    Number of Occurrences:   1    Standing Expiration Date:  06/21/2020     The total time spent in the appt was 35 minutes and more than 50% was on counseling and direct patient cares.  All of the patient's questions were answered with apparent satisfaction. The patient knows to call the clinic with any problems, questions or concerns.    Sullivan Lone MD [place] AAHIVMS [facility] Hematology/Oncology Physician [facility]  (Office):       [phone] (Work cell):  [phone] (Fax):           [phone]  06/22/2019 11:28 AM  I, Dawayne Cirri am acting as a scribe for Dr. Sullivan Lone.   .I have reviewed the above documentation for accuracy and completeness, and I agree with the above. Brunetta Genera MD

## 2019-06-22 ENCOUNTER — Inpatient Hospital Stay: Payer: 59

## 2019-06-22 ENCOUNTER — Other Ambulatory Visit: Payer: Self-pay

## 2019-06-22 ENCOUNTER — Inpatient Hospital Stay: Payer: 59 | Attending: Hematology | Admitting: Hematology

## 2019-06-22 VITALS — BP 123/78 | HR 65 | Temp 97.8°F | Resp 18 | Ht 67.0 in | Wt 165.1 lb

## 2019-06-22 DIAGNOSIS — R319 Hematuria, unspecified: Secondary | ICD-10-CM

## 2019-06-22 DIAGNOSIS — K921 Melena: Secondary | ICD-10-CM | POA: Diagnosis not present

## 2019-06-22 DIAGNOSIS — K649 Unspecified hemorrhoids: Secondary | ICD-10-CM

## 2019-06-22 DIAGNOSIS — R791 Abnormal coagulation profile: Secondary | ICD-10-CM

## 2019-06-22 LAB — APTT: aPTT: 31 seconds (ref 24–36)

## 2019-06-22 LAB — PLATELET FUNCTION ASSAY: Collagen / Epinephrine: 174 seconds (ref 0–193)

## 2019-06-22 LAB — PROTIME-INR
INR: 1 (ref 0.8–1.2)
Prothrombin Time: 12.8 seconds (ref 11.4–15.2)

## 2019-06-22 LAB — FIBRINOGEN: Fibrinogen: 385 mg/dL (ref 210–475)

## 2019-06-22 NOTE — Patient Instructions (Signed)
Thank you for choosing [facility] to provide your oncology and hematology care.   Should you have questions after your visit to the [facility] ([facility]), please contact this office at [phone] between 8:30 AM and 4:30 PM.  Voice mails left after 4:00 PM may not be returned until the following business day.  Calls received after 4:30 PM will be answered by an off-site Nurse Triage Line.    Prescription Refills:  Please have your pharmacy contact us directly for most prescription requests.  Contact the office directly for refills of narcotics (pain medications). Allow 48-72 hours for refills.  Appointments: Please contact the [facility] scheduling department [phone] for questions regarding [facility] appointment scheduling.  Contact the schedulers with any scheduling changes so that your appointment can be rescheduled in a timely manner.   Central Scheduling for [facility] [phone] - Call to schedule procedures such as PET scans, CT scans, MRI, Ultrasound, etc.  To afford each patient quality time with our providers, please arrive 30 minutes before your scheduled appointment time.  If you arrive late for your appointment, you may be asked to reschedule.  We strive to give you quality time with our providers, and arriving late affects you and other patients whose appointments are after yours. If you are a no show for multiple scheduled visits, you may be dismissed from the clinic at the providers discretion.     Resources: [facility] Social Workers [phone] for additional information on assistance programs or assistance connecting with community support programs   [place] DSS  [phone]: Information regarding food stamps, Medicaid, and utility assistance [place]AT 336-333-6589   [place] [employer]'s shared-ride transportation service for eligible riders who have a disability that prevents them from riding the fixed route bus.   Medicare Rights Center  800-333-4114 Helps people with Medicare understand their rights and benefits, navigate the Medicare system, and secure the quality healthcare they deserve [employer] 800-227-2345 Assists patients locate various types of support and financial assistance Cancer Care: 1-800-813-HOPE (4673) Provides financial assistance, online support groups, medication/co-pay assistance.   Transportation Assistance for appointments at [facility]: Transportation Coordinator [phone]  Again, thank you for choosing [facility] for your care.       

## 2019-06-24 LAB — PTT FACTOR INHIBITOR (MIXING STUDY): aPTT: 25.7 s (ref 22.9–30.2)

## 2019-06-24 LAB — FACTOR 9 ASSAY: Coagulation Factor IX: 100 % (ref 60–177)

## 2019-06-24 LAB — VON WILLEBRAND PANEL
Coagulation Factor VIII: 172 % — ABNORMAL HIGH (ref 56–140)
Ristocetin Co-factor, Plasma: 88 % (ref 50–200)
Von Willebrand Antigen, Plasma: 128 % (ref 50–200)

## 2019-06-24 LAB — COAG STUDIES INTERP REPORT

## 2019-06-24 LAB — LUPUS ANTICOAGULANT PANEL
DRVVT: 39.4 s (ref 0.0–47.0)
PTT Lupus Anticoagulant: 36.3 s (ref 0.0–51.9)

## 2019-06-24 LAB — FACTOR 11 ASSAY: Factor XI Activity: 138 % (ref 60–150)

## 2019-06-24 LAB — FACTOR 12 ASSAY: Factor XII Activity: 112 % (ref 50–150)

## 2019-06-30 ENCOUNTER — Ambulatory Visit (INDEPENDENT_AMBULATORY_CARE_PROVIDER_SITE_OTHER): Payer: 59

## 2019-06-30 ENCOUNTER — Other Ambulatory Visit: Payer: Self-pay

## 2019-06-30 ENCOUNTER — Other Ambulatory Visit: Payer: Self-pay | Admitting: Internal Medicine

## 2019-06-30 DIAGNOSIS — Z1159 Encounter for screening for other viral diseases: Secondary | ICD-10-CM

## 2019-06-30 LAB — SARS CORONAVIRUS 2 (TAT 6-24 HRS): SARS Coronavirus 2: NEGATIVE

## 2019-07-04 ENCOUNTER — Ambulatory Visit (AMBULATORY_SURGERY_CENTER): Payer: 59 | Admitting: Internal Medicine

## 2019-07-04 ENCOUNTER — Other Ambulatory Visit: Payer: Self-pay

## 2019-07-04 ENCOUNTER — Encounter: Payer: Self-pay | Admitting: Internal Medicine

## 2019-07-04 VITALS — BP 99/60 | HR 73 | Temp 96.8°F | Resp 16 | Ht 67.0 in | Wt 165.0 lb

## 2019-07-04 DIAGNOSIS — K625 Hemorrhage of anus and rectum: Secondary | ICD-10-CM

## 2019-07-04 DIAGNOSIS — K5730 Diverticulosis of large intestine without perforation or abscess without bleeding: Secondary | ICD-10-CM

## 2019-07-04 DIAGNOSIS — K642 Third degree hemorrhoids: Secondary | ICD-10-CM

## 2019-07-04 DIAGNOSIS — Z1211 Encounter for screening for malignant neoplasm of colon: Secondary | ICD-10-CM

## 2019-07-04 MED ORDER — SODIUM CHLORIDE 0.9 % IV SOLN: 500.0000 mL | Freq: Once | INTRAVENOUS | Status: DC

## 2019-07-04 NOTE — Progress Notes (Signed)
To PACU, VSS. Report to [email or web address] 

## 2019-07-04 NOTE — Op Note (Signed)
[facility] Patient Name: Todd [name] Procedure Date: 07/04/2019 11:37 AM MRN: [id number] Endoscopist: Jerene Bears , MD Age: 59 Referring MD:  Date of Birth: [birth year] Gender: Male Account #: [id number] Procedure:                Colonoscopy Indications:              Rectal bleeding Medicines:                Monitored Anesthesia Care Procedure:                Pre-Anesthesia Assessment:                           - Prior to the procedure, a History and Physical                            was performed, and patient medications and                            allergies were reviewed. The patient's tolerance of                            previous anesthesia was also reviewed. The risks                            and benefits of the procedure and the sedation                            options and risks were discussed with the patient.                            All questions were answered, and informed consent                            was obtained. Prior Anticoagulants: The patient has                            taken no previous anticoagulant or antiplatelet                            agents. ASA Grade Assessment: II - A patient with                            mild systemic disease. After reviewing the risks                            and benefits, the patient was deemed in                            satisfactory condition to undergo the procedure.                           After obtaining informed consent, the colonoscope  was passed under direct vision. Throughout the                            procedure, the patient's blood pressure, pulse, and                            oxygen saturations were monitored continuously. The                            Colonoscope was introduced through the anus and                            advanced to the cecum, identified by appendiceal                            orifice and ileocecal valve. The colonoscopy was                          performed without difficulty. The patient tolerated                            the procedure well. The quality of the bowel                            preparation was good. The ileocecal valve,                            appendiceal orifice, and rectum were photographed. Scope In: 11:48:20 AM Scope Out: 11:59:54 AM Scope Withdrawal Time: 0 hours 7 minutes 42 seconds  Total Procedure Duration: 0 hours 11 minutes 34 seconds  Findings:                 Hemorrhoids were found on perianal exam, large                            prolapsing Grade III internal and external.                           A few small-mouthed diverticula were found in the                            sigmoid colon, descending colon and ascending colon.                           External and internal hemorrhoids were found during                            retroflexion and during perianal exam. The                            hemorrhoids were large and Grade III Complications:            No immediate complications. Estimated Blood Loss:     Estimated blood loss: none. Impression:               -  Diverticulosis in the sigmoid colon, in the                            descending colon and in the ascending colon.                           - Large, prolapsing internal hemorrhoids and small                            external hemorrhoids. Source of rectal bleeding.                           - No specimens collected. Recommendation:           - Patient has a contact number available for                            emergencies. The signs and symptoms of potential                            delayed complications were discussed with the                            patient. Return to normal activities tomorrow.                            Written discharge instructions were provided to the                            patient.                           - Resume previous diet.                           - Continue  present medications.                           - Repeat colonoscopy in 10 years for surveillance                            (please change current recall to April 2031).                           - Referral to colorectal surgery for                            hemorrhoidectomy and banding unlikely to result in                            meaningful improvement in symptoms. Jerene Bears, MD 07/04/2019 12:09:37 PM This report has been signed electronically.

## 2019-07-04 NOTE — Patient Instructions (Signed)
Information on hemorrhoids and diverticulosis given to you today.  Repeat colonoscopy in 10 years.  Referral to colorectal surgeon for hemorrhoidectomy. Someone will call you to set up that appointment.  YOU HAD AN ENDOSCOPIC PROCEDURE TODAY AT [facility]:   Refer to the procedure report that was given to you for any specific questions about what was found during the examination.  If the procedure report does not answer your questions, please call your gastroenterologist to clarify.  If you requested that your care partner not be given the details of your procedure findings, then the procedure report has been included in a sealed envelope for you to review at your convenience later.  YOU SHOULD EXPECT: Some feelings of bloating in the abdomen. Passage of more gas than usual.  Walking can help get rid of the air that was put into your GI tract during the procedure and reduce the bloating. If you had a lower endoscopy (such as a colonoscopy or flexible sigmoidoscopy) you may notice spotting of blood in your stool or on the toilet paper. If you underwent a bowel prep for your procedure, you may not have a normal bowel movement for a few days.  Please Note:  You might notice some irritation and congestion in your nose or some drainage.  This is from the oxygen used during your procedure.  There is no need for concern and it should clear up in a day or so.  SYMPTOMS TO REPORT IMMEDIATELY:   Following lower endoscopy (colonoscopy or flexible sigmoidoscopy):  Excessive amounts of blood in the stool  Significant tenderness or worsening of abdominal pains  Swelling of the abdomen that is new, acute  Fever of 100F or higher   For urgent or emergent issues, a gastroenterologist can be reached at any hour by calling [phone]. Do not use MyChart messaging for urgent concerns.    DIET:  We do recommend a small meal at first, but then you may proceed to your regular diet.  Drink  plenty of fluids but you should avoid alcoholic beverages for 24 hours.  ACTIVITY:  You should plan to take it easy for the rest of today and you should NOT DRIVE or use heavy machinery until tomorrow (because of the sedation medicines used during the test).    FOLLOW UP: Our staff will call the number listed on your records 48-72 hours following your procedure to check on you and address any questions or concerns that you may have regarding the information given to you following your procedure. If we do not reach you, we will leave a message.  We will attempt to reach you two times.  During this call, we will ask if you have developed any symptoms of COVID 19. If you develop any symptoms (ie: fever, flu-like symptoms, shortness of breath, cough etc.) before then, please call [phone].  If you test positive for Covid 19 in the 2 weeks post procedure, please call and report this information to [place].    If any biopsies were taken you will be contacted by phone or by letter within the next 1-3 weeks.  Please call us at [phone] if you have not heard about the biopsies in 3 weeks.    SIGNATURES/CONFIDENTIALITY: You and/or your care partner have signed paperwork which will be entered into your electronic medical record.  These signatures attest to the fact that that the information above on your After Visit Summary has been reviewed and is understood.  Full  responsibility of the confidentiality of this discharge information lies with you and/or your care-partner.

## 2019-07-06 ENCOUNTER — Telehealth: Payer: Self-pay | Admitting: *Deleted

## 2019-07-06 ENCOUNTER — Telehealth: Payer: Self-pay

## 2019-07-06 NOTE — Telephone Encounter (Signed)
NO ANSWER, MESSAGE LEFT FOR PATIENT. 

## 2019-07-06 NOTE — Telephone Encounter (Signed)
  Follow up Call-  Call back number 07/04/2019  Post procedure Call Back phone  # [phone]  Permission to leave phone message Yes  Some recent data might be hidden     Patient questions:  Do you have a fever, pain , or abdominal swelling? No. Pain Score  0 *  Have you tolerated food without any problems? Yes.    Have you been able to return to your normal activities? Yes.    Do you have any questions about your discharge instructions: Diet   No. Medications  No. Follow up visit  No.  Do you have questions or concerns about your Care? No.  Actions: * If pain score is 4 or above: No action needed, pain <4.  1. Have you developed a fever since your procedure? no  2.   Have you had an respiratory symptoms (SOB or cough) since your procedure? no  3.   Have you tested positive for COVID 19 since your procedure no  4.   Have you had any family members/close contacts diagnosed with the COVID 19 since your procedure?  no   If yes to any of these questions please route to Joylene John, RN and Erenest Rasher, RN

## 2019-08-04 ENCOUNTER — Encounter (HOSPITAL_COMMUNITY): Payer: Self-pay

## 2019-08-04 ENCOUNTER — Other Ambulatory Visit: Payer: Self-pay

## 2019-08-04 ENCOUNTER — Ambulatory Visit (HOSPITAL_COMMUNITY)
Admission: EM | Admit: 2019-08-04 | Discharge: 2019-08-04 | Disposition: A | Discharge status: Home / Self Care | Payer: 59 | Attending: Family Medicine | Admitting: Family Medicine

## 2019-08-04 DIAGNOSIS — R6883 Chills (without fever): Secondary | ICD-10-CM | POA: Diagnosis not present

## 2019-08-04 DIAGNOSIS — Z20822 Contact with and (suspected) exposure to covid-19: Secondary | ICD-10-CM | POA: Diagnosis not present

## 2019-08-04 NOTE — ED Provider Notes (Signed)
Todd [name]    CSN: [id number] Arrival date & time: 08/04/19  1715      History   Chief Complaint Chief Complaint  Patient presents with  . chills    HPI Todd [name] is a 59 y.o. male.   Patient presents for Covid testing due to chills.  Reports chills started her last 2 days.  Denies other symptoms of cough, congestion, headache, measured fever, body ache, sore throat, nausea, vomiting, diarrhea, change in taste or smell.  He simply would like to be tested for Covid as he is due for his Covid vaccine coming up.  He denies any known exposures.  Denies any painful urination, frequency or urgency.  Denies abdominal pain.     Past Medical History:  Diagnosis Date  . Complicated migraine   . Diverticulosis   . External hemorrhoids   . External hemorrhoids   . Headache   . Hx of cardiovascular stress test    Myoview 8/10:  no ischemia, EF 62%; low risk  . Hx of Doppler ultrasound    Carotid US 6/15:  bilat ICA 1-39%  . Hx of echocardiogram    echo 6/15:  EF 60-65%, no RWMA  . Internal hemorrhoids   . Palpitations   . Stroke ([place])   . TIA (transient ischemic attack)   . Tubular adenoma of colon   . Vision abnormalities     Patient Active Problem List   Diagnosis Date Noted  . Shortness of breath 11/12/2016  . Chest pain 11/12/2016  . Heart palpitations 10/20/2013  . Situational anxiety 10/20/2013  . Migraine with visual aura 08/24/2013  . TIA (transient ischemic attack) 08/23/2013  . Blurred vision 08/23/2013  . Anomia 08/23/2013    Past Surgical History:  Procedure Laterality Date  . APPENDECTOMY  12/01/11   lap appy  . LAPAROSCOPIC APPENDECTOMY  12/02/2011   Procedure: APPENDECTOMY LAPAROSCOPIC;  Surgeon: Madilyn Hook, DO;  Location: [facility];  Service: General;  Laterality: N/A;       Home Medications    Prior to Admission medications   Medication Sig Start Date End Date Taking? Authorizing Provider  hydrOXYzine (ATARAX/VISTARIL) 25 MG tablet  Take 25 mg by mouth every 6 (six) hours as needed. 01/18/19   [provider]  levocetirizine (XYZAL) 5 MG tablet Take by mouth. 10/12/18   [provider]  Multiple Vitamin (MULTIVITAMIN ADULT PO) Take 1 tablet by mouth daily.    [provider]    Family History Family History  Problem Relation Age of Onset  . Hypertension Mother   . Hypertension Father   . Anemia Neg Hx   . Arrhythmia Neg Hx   . Asthma Neg Hx   . Clotting disorder Neg Hx   . Fainting Neg Hx   . Heart attack Neg Hx   . Heart disease Neg Hx   . Heart failure Neg Hx   . Hyperlipidemia Neg Hx   . Colon cancer Neg Hx   . Esophageal cancer Neg Hx   . Stomach cancer Neg Hx   . Rectal cancer Neg Hx     Social History Social History   Tobacco Use  . Smoking status: Never Smoker  . Smokeless tobacco: Never Used  Substance Use Topics  . Alcohol use: No  . Drug use: No     Allergies   Bupropion and Wellbutrin [bupropion hcl]   Review of Systems Review of Systems   Physical Exam Triage Vital Signs ED Triage Vitals [08/04/19  1735]  Enc Vitals Group     BP 120/81     Pulse Rate 71     Resp 16     Temp 98.2 F (36.8 C)     Temp Source Oral     SpO2 98 %     Weight 160 lb (72.6 kg)     Height 5\' 7"  (1.702 m)     Head Circumference      Peak Flow      Pain Score 0     Pain Loc      Pain Edu?      Excl. in [place]?    No data found.  Updated Vital Signs BP 120/81   Pulse 71   Temp 98.2 F (36.8 C) (Oral)   Resp 16   Ht 5\' 7"  (1.702 m)   Wt 160 lb (72.6 kg)   SpO2 98%   BMI 25.06 kg/m   Visual Acuity Right Eye Distance:   Left Eye Distance:   Bilateral Distance:    Right Eye Near:   Left Eye Near:    Bilateral Near:     Physical Exam Vitals and nursing note reviewed.  Constitutional:      Appearance: He is well-developed.  HENT:     Head: Normocephalic and atraumatic.  Eyes:     Conjunctiva/sclera: Conjunctivae normal.  Cardiovascular:     Rate and  Rhythm: Normal rate and regular rhythm.     Heart sounds: No murmur.  Pulmonary:     Effort: Pulmonary effort is normal. No respiratory distress.     Breath sounds: Normal breath sounds.  Musculoskeletal:     Cervical back: Neck supple.  Skin:    General: Skin is warm and dry.  Neurological:     Mental Status: He is alert.      UC Treatments / Results  Labs (all labs ordered are listed, but only abnormal results are displayed) Labs Reviewed  SARS CORONAVIRUS 2 (TAT 6-24 HRS)    EKG   Radiology No results found.  Procedures Procedures (including critical care time)  Medications Ordered in UC Medications - No data to display  Initial Impression / Assessment and Plan / UC Course  I have reviewed the triage vital signs and the nursing notes.  Pertinent labs & imaging results that were available during my care of the patient were reviewed by me and considered in my medical decision making (see chart for details).     #Chills without fever Patient is a 59 year old presenting with chills.  No accompanying symptoms.  Covid PCR sent.  Discussed outpatient will be notified.  Patient verbalized understanding  Final Clinical Impressions(s) / UC Diagnoses   Final diagnoses:  Chills (without fever)  Encounter for laboratory testing for COVID-19 virus     Discharge Instructions     If your Covid-19 test is positive, you will receive a phone call from [facility] regarding your results. Negative test results are not called. Both positive and negative results area always visible on MyChart. If you do not have a MyChart account, sign up instructions are in your discharge papers.   Persons who are directed to care for themselves at home may discontinue isolation under the following conditions:  . At least 10 days have passed since symptom onset and . At least 24 hours have passed without running a fever (this means without the use of fever-reducing medications) and . Other  symptoms have improved.  Persons infected with COVID-19 who never develop  symptoms may discontinue isolation and other precautions 10 days after the date of their first positive COVID-19 test.     ED Prescriptions    None     PDMP not reviewed this encounter.   Purnell Shoemaker, PA-C 08/04/19 2137

## 2019-08-04 NOTE — Discharge Instructions (Signed)
If your Covid-19 test is positive, you will receive a phone call from [facility] regarding your results. Negative test results are not called. Both positive and negative results area always visible on MyChart. If you do not have a MyChart account, sign up instructions are in your discharge papers.   Persons who are directed to care for themselves at home may discontinue isolation under the following conditions:   At least 10 days have passed since symptom onset and  At least 24 hours have passed without running a fever (this means without the use of fever-reducing medications) and  Other symptoms have improved.  Persons infected with COVID-19 who never develop symptoms may discontinue isolation and other precautions 10 days after the date of their first positive COVID-19 test.  

## 2019-08-04 NOTE — ED Triage Notes (Signed)
Pt c/o chillsx2 days. PT denies any other symptoms.

## 2019-08-05 LAB — SARS CORONAVIRUS 2 (TAT 6-24 HRS): SARS Coronavirus 2: NEGATIVE

## 2019-08-11 ENCOUNTER — Other Ambulatory Visit: Payer: Self-pay

## 2019-08-11 ENCOUNTER — Emergency Department (HOSPITAL_COMMUNITY): Admission: EM | Admit: 2019-08-11 | Discharge: 2019-08-11 | Discharge status: Absent without leave | Payer: 59

## 2019-08-12 ENCOUNTER — Encounter (HOSPITAL_COMMUNITY): Payer: Self-pay | Admitting: *Deleted

## 2019-08-12 ENCOUNTER — Emergency Department (HOSPITAL_COMMUNITY)
Admission: EM | Admit: 2019-08-12 | Discharge: 2019-08-12 | Disposition: A | Discharge status: Home / Self Care | Payer: 59 | Attending: Internal Medicine | Admitting: Internal Medicine

## 2019-08-12 ENCOUNTER — Other Ambulatory Visit: Payer: Self-pay

## 2019-08-12 ENCOUNTER — Emergency Department (HOSPITAL_COMMUNITY): Payer: 59

## 2019-08-12 DIAGNOSIS — R200 Anesthesia of skin: Secondary | ICD-10-CM | POA: Insufficient documentation

## 2019-08-12 DIAGNOSIS — R519 Headache, unspecified: Secondary | ICD-10-CM | POA: Diagnosis not present

## 2019-08-12 DIAGNOSIS — R0789 Other chest pain: Secondary | ICD-10-CM | POA: Insufficient documentation

## 2019-08-12 LAB — CBC
HCT: 43.6 % (ref 39.0–52.0)
Hemoglobin: 14.5 g/dL (ref 13.0–17.0)
MCH: 29.2 pg (ref 26.0–34.0)
MCHC: 33.3 g/dL (ref 30.0–36.0)
MCV: 87.9 fL (ref 80.0–100.0)
Platelets: 268 10*3/uL (ref 150–400)
RBC: 4.96 MIL/uL (ref 4.22–5.81)
RDW: 12 % (ref 11.5–15.5)
WBC: 9.5 10*3/uL (ref 4.0–10.5)
nRBC: 0 % (ref 0.0–0.2)

## 2019-08-12 LAB — TROPONIN I (HIGH SENSITIVITY)
Troponin I (High Sensitivity): 6 ng/L (ref <18)
Troponin I (High Sensitivity): 5 ng/L (ref <18)

## 2019-08-12 LAB — BASIC METABOLIC PANEL
Anion gap: 11 (ref 5–15)
BUN: 12 mg/dL (ref 6–20)
CO2: 23 mmol/L (ref 22–32)
Calcium: 9.2 mg/dL (ref 8.9–10.3)
Chloride: 103 mmol/L (ref 98–111)
Creatinine, Ser: 1.02 mg/dL (ref 0.61–1.24)
GFR calc Af Amer: >60 mL/min (ref >60)
GFR calc non Af Amer: >60 mL/min (ref >60)
Glucose, Bld: 111 mg/dL — ABNORMAL HIGH (ref 70–99)
Potassium: 4.3 mmol/L (ref 3.5–5.1)
Sodium: 137 mmol/L (ref 135–145)

## 2019-08-12 MED ORDER — DIPHENHYDRAMINE HCL 50 MG/ML IJ SOLN
12.5000 mg | Freq: Once | INTRAMUSCULAR | Status: AC
  Administered 2019-08-12: 12.5 mg via INTRAVENOUS
  Filled 2019-08-12: qty 1

## 2019-08-12 MED ORDER — PROCHLORPERAZINE EDISYLATE 10 MG/2ML IJ SOLN
10.0000 mg | Freq: Once | INTRAMUSCULAR | Status: AC
  Administered 2019-08-12: 10 mg via INTRAVENOUS
  Filled 2019-08-12: qty 2

## 2019-08-12 MED ORDER — SODIUM CHLORIDE 0.9% FLUSH
3.0000 mL | Freq: Once | INTRAVENOUS | Status: AC
  Administered 2019-08-12: 3 mL via INTRAVENOUS

## 2019-08-12 MED ORDER — SODIUM CHLORIDE 0.9 % IV BOLUS
1000.0000 mL | Freq: Once | INTRAVENOUS | Status: AC
  Administered 2019-08-12: 1000 mL via INTRAVENOUS

## 2019-08-12 NOTE — ED Triage Notes (Signed)
Onset of chest tightness pain radiates down the left arm with numbness, dizziness and headache around 1700 today. Generalized weakness, increased fatigue. denies fevers.

## 2019-08-12 NOTE — ED Provider Notes (Addendum)
Care of the patient was assumed from [place] PA-C at 6:30 ; see this PA-C note for complete history of present illness, review of systems, and physical exam.  Briefly, the patient is a 59 y.o. male who presented to the ED with left arm paraesthesias and HA.  Headache has been gradual and worsening since late yesterday evening, also reported some chest tightness at the time but denied any pain.  No shortness of breath.  No symptoms in the right arm.   History significant for stroke, TIA and complicated migraine.  Was last seen by Dr. Leonie Man, [facility] neurology in August 0000000 for complicated migraine.  Plan at time of handoff:  Awaiting head and neck CT.  If this is negative to do head and C-spine MRI without contrast.  Will consult neurology due to previous history of TIA.   Physical Exam  BP (!) 147/90 (BP Location: Right Arm)   Pulse 81   Temp (!) 97.5 F (36.4 C) (Oral)   Resp 16   SpO2 100%   Physical Exam Constitutional:      General: He is not in acute distress.    Appearance: Normal appearance. He is not ill-appearing, toxic-appearing or diaphoretic.  HENT:     Mouth/Throat:     Mouth: Mucous membranes are moist.     Pharynx: Oropharynx is clear.  Eyes:     General: No scleral icterus.    Extraocular Movements: Extraocular movements intact.     Pupils: Pupils are equal, round, and reactive to light.  Cardiovascular:     Rate and Rhythm: Normal rate and regular rhythm.     Pulses: Normal pulses.     Heart sounds: Normal heart sounds.  Pulmonary:     Effort: Pulmonary effort is normal. No respiratory distress.     Breath sounds: Normal breath sounds. No stridor. No wheezing, rhonchi or rales.  Chest:     Chest wall: No tenderness.  Abdominal:     General: Abdomen is flat. There is no distension.     Palpations: Abdomen is soft.     Tenderness: There is no abdominal tenderness. There is no guarding or rebound.  Musculoskeletal:        General: No swelling or tenderness.  Normal range of motion.     Cervical back: Normal range of motion and neck supple. No rigidity.     Right lower leg: No edema.     Left lower leg: No edema.  Skin:    General: Skin is warm and dry.     Capillary Refill: Capillary refill takes less than 2 seconds.     Coloration: Skin is not pale.  Neurological:     General: No focal deficit present.     Mental Status: He is alert and oriented to person, place, and time.     Comments: Clear speech. No facial droop. CNIII-XII grossly intact. Bilateral upper and lower extremities' sensation grossly intact. 5/5 symmetric strength with grip strength and with plantar and dorsi flexion bilaterally.  Gait is steady and intact   Psychiatric:        Mood and Affect: Mood normal.        Behavior: Behavior normal.     ED Course/Procedures     Procedures  MDM   59 y.o. male who presented to the ED with left arm paraesthesias and HA.  Patient originally presented with chest tightness, EKG is without ischemia.  Initial and repeat troponin negative.  Heart score of 4 however previous provider  was not concerned about ACS due to presentation.  Due to headache and arm numbness at the same time are more concerned of stroke versus complicated migraine.  Migraine cocktail was given before handoff and CT are pending.  8:17 Negative Head CT and cervical spine CT. Will obtain MRI at this point.  8:30 spoke to patient about nexk steps and MRI.  Patient repeatedly refused MRI, states that he wants to go home.  Discussed risks of this and that he could be having a stroke, patient states that he will follow-up with neurology outpatient, repeatedly states that he wants to go home due to time spent in the emergency room.  Patient states that all of his pain is gone, after migraine cocktail was given.  Patient states that his paresthesias in his left arm only lasted a minute,  he was only left with a headache when he presented to the ER.  Had not taken anything for his  headache and after given migraine cocktail all of his symptoms disappeared including his mild chest tightness.  Discussed that this could be an atypical migraine, however MRI needs to be done to fully rule out stroke.  Expressed that he could have had a TIA, however patient insists on going home.  Discussed risks of this multiple times.  Repeat neuro exam was negative.  Will have patient follow-up with [facility] neurology and do ambulatory referral.  MRI needed to be done to rule out stroke, however patient did not want to stay.  Asked patient to stay well I consulted neurology, patient states that he wants to leave and will promise to see outpatient neurology as soon as possible, but not today. States he feels better and wants to go home and sleep. Pt to be discharged, explained this is against medical advise, pt is aware.Other CT degenerative changes were discussed.  I explained  explicit precautions to return to the ER including for any other new or worsening symptoms. The patient understands and accepts the medical plan as it's been dictated and I have answered their questions. Discharge instructions concerning home care and prescriptions have been given. The patient is STABLE, will do ambulatory referral to neuro.        Alfredia Client, PA-C 08/12/19 1729    Alfredia Client, PA-C 08/12/19 [place], April, MD 08/12/19 2345

## 2019-08-12 NOTE — Discharge Instructions (Signed)
You were seen today for left arm numbness and tingling along with a headache.  Your CT scans were negative, however we spoke about MRI and you refused at this time.  I really think it is important for you to follow-up with Dr. Leonie Man for your headache.  As I spoke to you about this could be a stroke.  This also could be one of your migraines.  Your chest work-up was negative.  Come back to the emergency room if you have any worsening or similar symptoms.  Use the guides that I gave you above.  Take naproxen as prescribed on the bottle for your migraines, I want you to follow-up about this as well.  .Read instructions below for reasons to return to the Emergency Department. It is recommended that your follow up with your Primary Care Doctor in regards to today's visit. If you do not have a doctor, use the resource guide listed below to help you find one. Begin taking over the counter Prilosec or Zegrid as directed.   Chest Pain (Nonspecific)  HOME CARE INSTRUCTIONS  For the next few days, avoid physical activities that bring on chest pain. Continue physical activities as directed.  Do not smoke cigarettes or drink alcohol until your symptoms are gone.  Only take over-the-counter or prescription medicine for pain, discomfort, or fever as directed by your caregiver.  Follow your caregiver's suggestions for further testing if your chest pain does not go away.  Keep any follow-up appointments you made. If you do not go to an appointment, you could develop lasting (chronic) problems with pain. If there is any problem keeping an appointment, you must call to reschedule.  SEEK MEDICAL CARE IF:  You think you are having problems from the medicine you are taking. Read your medicine instructions carefully.  Your chest pain does not go away, even after treatment.  You develop a rash with blisters on your chest.  SEEK IMMEDIATE MEDICAL CARE IF:  You have increased chest pain or pain that spreads to your arm, neck,  jaw, back, or belly (abdomen).  You develop shortness of breath, an increasing cough, or you are coughing up blood.  You have severe back or abdominal pain, feel sick to your stomach (nauseous) or throw up (vomit).  You develop severe weakness, fainting, or chills.  You have an oral temperature above 102 F (38.9 C), not controlled by medicine.   THIS IS AN EMERGENCY. Do not wait to see if the pain will go away. Get medical help at once. Call your local emergency services (911 in [place] Do not drive yourself to the ED.

## 2019-08-12 NOTE — ED Provider Notes (Signed)
[facility] EMERGENCY DEPARTMENT Provider Note   CSN: [id number] Arrival date & time: 08/12/19  0124     History Chief Complaint  Patient presents with  . Chest Pain    Todd [name] is a 59 y.o. male with a hx of complicated migraine, TIA, palpitations, stroke presents to the Emergency Department complaining of gradual, persistent, progressively worsening headache onset around 5 PM yesterday evening.  Patient reports he has associated neck pain and left arm numbness.  He reports he did also have some chest tightness at the time but no specific pain.  No shortness of breath.  No symptoms in the right arm or either leg.  Patient reports he presented to [facility] but felt better after a while and left.  He reports the pain then later returned in his head and arm.  He reports his symptoms come in "waves."  Patient does state a history of TIA and complicated migraine however reports today's headache is different from previous migraines.  No treatments prior to arrival.  No specific aggravating or alleviating factors.  Previous cardiac work-up includes: Exercise tolerance test with good exercise tolerance, no chest pain, normal blood pressure response, no ST changes in September 2018.  Stress test in 2016 was also normal.  Other records reviewed.  Patient last seen by Dr. Leonie Man, [facility] in August 2017.  At that time he was being treated for complicated migraine.  The history is provided by the patient and medical records. No language interpreter was used.    HPI: A 59 year old patient with a history of CVA presents for evaluation of chest pain. Initial onset of pain was more than 6 hours ago. The patient's chest pain is described as heaviness/pressure/tightness and is not worse with exertion. The patient's chest pain is not middle- or left-sided, is not well-localized, is not sharp and does radiate to the arms/jaw/neck. The patient does not complain of nausea and  denies diaphoresis. The patient has no history of peripheral artery disease, has not smoked in the past 90 days, denies any history of treated diabetes, has no relevant family history of coronary artery disease (first degree relative at less than age 26), is not hypertensive, has no history of hypercholesterolemia and does not have an elevated BMI (>=30).   Past Medical History:  Diagnosis Date  . Complicated migraine   . Diverticulosis   . External hemorrhoids   . External hemorrhoids   . Headache   . Hx of cardiovascular stress test    Myoview 8/10:  no ischemia, EF 62%; low risk  . Hx of Doppler ultrasound    Carotid US 6/15:  bilat ICA 1-39%  . Hx of echocardiogram    echo 6/15:  EF 60-65%, no RWMA  . Internal hemorrhoids   . Palpitations   . Stroke ([place])   . TIA (transient ischemic attack)   . Tubular adenoma of colon   . Vision abnormalities     Patient Active Problem List   Diagnosis Date Noted  . Shortness of breath 11/12/2016  . Chest pain 11/12/2016  . Heart palpitations 10/20/2013  . Situational anxiety 10/20/2013  . Migraine with visual aura 08/24/2013  . TIA (transient ischemic attack) 08/23/2013  . Blurred vision 08/23/2013  . Anomia 08/23/2013    Past Surgical History:  Procedure Laterality Date  . APPENDECTOMY  12/01/11   lap appy  . LAPAROSCOPIC APPENDECTOMY  12/02/2011   Procedure: APPENDECTOMY LAPAROSCOPIC;  Surgeon: Madilyn Hook, DO;  Location: [facility];  Service: General;  Laterality: N/A;       Family History  Problem Relation Age of Onset  . Hypertension Mother   . Hypertension Father   . Anemia Neg Hx   . Arrhythmia Neg Hx   . Asthma Neg Hx   . Clotting disorder Neg Hx   . Fainting Neg Hx   . Heart attack Neg Hx   . Heart disease Neg Hx   . Heart failure Neg Hx   . Hyperlipidemia Neg Hx   . Colon cancer Neg Hx   . Esophageal cancer Neg Hx   . Stomach cancer Neg Hx   . Rectal cancer Neg Hx     Social History   Tobacco Use  . Smoking  status: Never Smoker  . Smokeless tobacco: Never Used  Substance Use Topics  . Alcohol use: No  . Drug use: No    Home Medications Prior to Admission medications   Not on File    Allergies    Wellbutrin [bupropion hcl]  Review of Systems   Review of Systems  Constitutional: Negative for appetite change, diaphoresis, fatigue, fever and unexpected weight change.  HENT: Negative for mouth sores.   Eyes: Negative for visual disturbance.  Respiratory: Negative for cough, chest tightness, shortness of breath and wheezing.   Cardiovascular: Positive for chest pain.  Gastrointestinal: Negative for abdominal pain, constipation, diarrhea, nausea and vomiting.  Endocrine: Negative for polydipsia, polyphagia and polyuria.  Genitourinary: Negative for dysuria, frequency, hematuria and urgency.  Musculoskeletal: Negative for back pain and neck stiffness.  Skin: Negative for rash.  Allergic/Immunologic: Negative for immunocompromised state.  Neurological: Positive for numbness and headaches. Negative for syncope and light-headedness.  Hematological: Does not bruise/bleed easily.  Psychiatric/Behavioral: Negative for sleep disturbance. The patient is not nervous/anxious.     Physical Exam Updated Vital Signs BP (!) 147/90 (BP Location: Right Arm)   Pulse 81   Temp (!) 97.5 F (36.4 C) (Oral)   Resp 16   SpO2 100%   Physical Exam Vitals and nursing note reviewed.  Constitutional:      General: He is not in acute distress.    Appearance: He is well-developed. He is not diaphoretic.  HENT:     Head: Normocephalic and atraumatic.  Eyes:     General: No scleral icterus.    Conjunctiva/sclera: Conjunctivae normal.     Pupils: Pupils are equal, round, and reactive to light.     Comments: No horizontal, vertical or rotational nystagmus  Neck:     Comments: Full active and passive ROM without pain No midline or paraspinal tenderness No nuchal rigidity or meningeal signs  Cardiovascular:     Rate and Rhythm: Normal rate and regular rhythm.  Pulmonary:     Effort: Pulmonary effort is normal. No respiratory distress.     Breath sounds: Normal breath sounds. No wheezing or rales.  Abdominal:     General: Bowel sounds are normal.     Palpations: Abdomen is soft.     Tenderness: There is no abdominal tenderness. There is no guarding or rebound.  Musculoskeletal:        General: Normal range of motion.     Cervical back: Normal range of motion and neck supple.  Lymphadenopathy:     Cervical: No cervical adenopathy.  Skin:    General: Skin is warm and dry.     Findings: No rash.  Neurological:     Mental Status: He is alert and oriented to person, place, and  time.     Cranial Nerves: No cranial nerve deficit.     Motor: No abnormal muscle tone.     Coordination: Coordination normal.     Comments: Mental Status:  Alert, oriented, thought content appropriate. Speech fluent without evidence of aphasia. Able to follow 2 step commands without difficulty.  Cranial Nerves:  II:  Peripheral visual fields grossly normal, pupils equal, round, reactive to light III,IV, VI: ptosis not present, extra-ocular motions intact bilaterally  V,VII: smile symmetric, facial light touch sensation equal VIII: hearing grossly normal bilaterally  IX,X: midline uvula rise  XI: bilateral shoulder shrug equal and strong XII: midline tongue extension  Motor:  5/5 in upper and lower extremities bilaterally including strong and equal grip strength and dorsiflexion/plantar flexion Sensory: Pinprick and light touch normal in all extremities.  Cerebellar: normal finger-to-nose with bilateral upper extremities Gait: normal gait and balance CV: distal pulses palpable throughout   Psychiatric:        Behavior: Behavior normal.        Thought Content: Thought content normal.        Judgment: Judgment normal.     ED Results / Procedures / Treatments   Labs (all labs ordered are  listed, but only abnormal results are displayed) Labs Reviewed  BASIC METABOLIC PANEL - Abnormal; Notable for the following components:      Result Value   Glucose, Bld 111 (*)    All other components within normal limits  CBC  TROPONIN I (HIGH SENSITIVITY)  TROPONIN I (HIGH SENSITIVITY)    EKG EKG Interpretation  Date/Time:  Friday Aug 12 2019 01:23:13 EDT Ventricular Rate:  81 PR Interval:  154 QRS Duration: 84 QT Interval:  394 QTC Calculation: 457 R Axis:   48 Text Interpretation: Normal sinus rhythm Confirmed by Randal Buba, April ([id number]) on 08/12/2019 5:25:50 AM   Radiology DG Chest 2 View  Result Date: 08/12/2019 CLINICAL DATA:  Initial evaluation for acute chest pain. EXAM: CHEST - 2 VIEW COMPARISON:  Prior radiograph from 04/13/2019. FINDINGS: The cardiac and mediastinal silhouettes are stable in size and contour, and remain within normal limits. Aortic atherosclerosis. The lungs are normally inflated. No airspace consolidation, pleural effusion, or pulmonary edema. No pneumothorax. No acute osseous abnormality. IMPRESSION: 1. No active cardiopulmonary disease. 2.  Aortic Atherosclerosis (ICD10-I70.0). Electronically Signed   By: Jeannine Boga M.D.   On: 08/12/2019 02:46    Procedures Procedures (including critical care time)  Medications Ordered in ED Medications  sodium chloride flush (NS) 0.9 % injection 3 mL (has no administration in time range)  sodium chloride 0.9 % bolus 1,000 mL (has no administration in time range)  prochlorperazine (COMPAZINE) injection 10 mg (has no administration in time range)  diphenhydrAMINE (BENADRYL) injection 12.5 mg (has no administration in time range)    ED Course  I have reviewed the triage vital signs and the nursing notes.  Pertinent labs & imaging results that were available during my care of the patient were reviewed by me and considered in my medical decision making (see chart for details).    MDM  Rules/Calculators/A&P HEAR Score: 4                     Patient presents to the emergency department with several complaints.  Tonight he has headache, left arm numbness and some chest tightness.  EKG is without ischemia.  Initial and repeat troponin negative.  Heart score of 4 makes patient a moderate risk however symptoms today  are not consistent with ACS.  Given that headache and left arm numbness occurred at the same time I am more concerned about stroke versus complicated migraine.  Given migraine cocktail.  Head CT pending.  If this is normal he will need MRI.  The patient was discussed with and seen by Dr. Randal Buba who agrees with the treatment plan.  6:56 AM At shift change care was transferred to [facility] who will follow pending studies, re-evaulate, discussed with neurology and determine disposition.    Final Clinical Impression(s) / ED Diagnoses Final diagnoses:  Atypical chest pain  Bad headache    Rx / DC Orders ED Discharge Orders    None       Muthersbaugh, Gwenlyn Perking 08/12/19 Millard, April, MD 08/12/19 [id number]

## 2019-09-19 ENCOUNTER — Other Ambulatory Visit: Payer: Self-pay

## 2019-09-19 ENCOUNTER — Encounter: Payer: Self-pay | Admitting: Family Medicine

## 2019-09-19 ENCOUNTER — Ambulatory Visit (INDEPENDENT_AMBULATORY_CARE_PROVIDER_SITE_OTHER): Payer: 59 | Admitting: Family Medicine

## 2019-09-19 VITALS — BP 130/89 | HR 65 | Temp 98.2°F | Ht 67.0 in | Wt 170.0 lb

## 2019-09-19 DIAGNOSIS — Z131 Encounter for screening for diabetes mellitus: Secondary | ICD-10-CM | POA: Diagnosis not present

## 2019-09-19 DIAGNOSIS — R140 Abdominal distension (gaseous): Secondary | ICD-10-CM | POA: Diagnosis not present

## 2019-09-19 DIAGNOSIS — Z130 Encounter for screening for diseases of the blood and blood-forming organs and certain disorders involving the immune mechanism: Secondary | ICD-10-CM | POA: Diagnosis not present

## 2019-09-19 DIAGNOSIS — Z13220 Encounter for screening for lipoid disorders: Secondary | ICD-10-CM | POA: Diagnosis not present

## 2019-09-19 DIAGNOSIS — Z1329 Encounter for screening for other suspected endocrine disorder: Secondary | ICD-10-CM

## 2019-09-19 DIAGNOSIS — K5900 Constipation, unspecified: Secondary | ICD-10-CM

## 2019-09-19 DIAGNOSIS — R351 Nocturia: Secondary | ICD-10-CM

## 2019-09-19 DIAGNOSIS — Z125 Encounter for screening for malignant neoplasm of prostate: Secondary | ICD-10-CM

## 2019-09-19 NOTE — Patient Instructions (Addendum)
Try citrucel or metamucil daily, then if not effective for bowel movements regularly, can take miralax once per day as needed. Stop if diarrhea.  continue to drink plenty of water, and vegetables and fruit in the diet.  recheck in 2 weeks. I will check some screening tests today.  Return to the clinic or go to the nearest emergency room if any of your symptoms worsen or new symptoms occur, including if any worse urinary symptoms.     Constipation, Adult Constipation is when a person has fewer bowel movements in a week than normal, has difficulty having a bowel movement, or has stools that are dry, hard, or larger than normal. Constipation may be caused by an underlying condition. It may become worse with age if a person takes certain medicines and does not take in enough fluids. Follow these instructions at home: Eating and drinking   Eat foods that have a lot of fiber, such as fresh fruits and vegetables, whole grains, and beans.  Limit foods that are high in fat, low in fiber, or overly processed, such as french fries, hamburgers, cookies, candies, and soda.  Drink enough fluid to keep your urine clear or pale yellow. General instructions  Exercise regularly or as told by your health care provider.  Go to the restroom when you have the urge to go. Do not hold it in.  Take over-the-counter and prescription medicines only as told by your health care provider. These include any fiber supplements.  Practice pelvic floor retraining exercises, such as deep breathing while relaxing the lower abdomen and pelvic floor relaxation during bowel movements.  Watch your condition for any changes.  Keep all follow-up visits as told by your health care provider. This is important. Contact a health care provider if:  You have pain that gets worse.  You have a fever.  You do not have a bowel movement after 4 days.  You vomit.  You are not hungry.  You lose weight.  You are bleeding from  the anus.  You have thin, pencil-like stools. Get help right away if:  You have a fever and your symptoms suddenly get worse.  You leak stool or have blood in your stool.  Your abdomen is bloated.  You have severe pain in your abdomen.  You feel dizzy or you faint. This information is not intended to replace advice given to you by your health care provider. Make sure you discuss any questions you have with your health care provider. Document Revised: 02/20/2017 Document Reviewed: 08/29/2015 Elsevier Patient Education  [employer].  Abdominal Bloating When you have abdominal bloating, your abdomen may feel full, tight, or painful. It may also look bigger than normal or swollen (distended). Common causes of abdominal bloating include:  Swallowing air.  Constipation.  Problems digesting food.  Eating too much.  Irritable bowel syndrome. This is a condition that affects the large intestine.  Lactose intolerance. This is an inability to digest lactose, a natural sugar in dairy products.  Celiac disease. This is a condition that affects the ability to digest gluten, a protein found in some grains.  Gastroparesis. This is a condition that slows down the movement of food in the stomach and small intestine. It is more common in people with diabetes mellitus.  Gastroesophageal reflux disease (GERD). This is a digestive condition that makes stomach acid flow back into the esophagus.  Urinary retention. This means that the body is holding onto urine, and the bladder cannot be emptied  all the way. Follow these instructions at home: Eating and drinking  Avoid eating too much.  Try not to swallow air while talking or eating.  Avoid eating while lying down.  Avoid these foods and drinks: ? Foods that cause gas, such as broccoli, cabbage, cauliflower, and baked beans. ? Carbonated drinks. ? Hard candy. ? Chewing gum. Medicines  Take over-the-counter and prescription  medicines only as told by your health care provider.  Take probiotic medicines. These medicines contain live bacteria or yeasts that can help digestion.  Take coated peppermint oil capsules. Activity  Try to exercise regularly. Exercise may help to relieve bloating that is caused by gas and relieve constipation. General instructions  Keep all follow-up visits as told by your health care provider. This is important. Contact a health care provider if:  You have nausea and vomiting.  You have diarrhea.  You have abdominal pain.  You have unusual weight loss or weight gain.  You have severe pain, and medicines do not help. Get help right away if:  You have severe chest pain.  You have trouble breathing.  You have shortness of breath.  You have trouble urinating.  You have darker urine than normal.  You have blood in your stools or have dark, tarry stools. Summary  Abdominal bloating means that the abdomen is swollen.  Common causes of abdominal bloating are swallowing air, constipation, and problems digesting food.  Avoid eating too much and avoid swallowing air.  Avoid foods that cause gas, carbonated drinks, hard candy, and chewing gum. This information is not intended to replace advice given to you by your health care provider. Make sure you discuss any questions you have with your health care provider. Document Revised: 06/28/2018 Document Reviewed: 04/11/2016 Elsevier Patient Education  [employer].     If you have lab work done today you will be contacted with your lab results within the next 2 weeks.  If you have not heard from [place] then please contact us. The fastest way to get your results is to register for My Chart.   IF you received an x-ray today, you will receive an invoice from [facility]. Please contact [facility] at [phone] with questions or concerns regarding your invoice.   IF you received labwork today, you will  receive an invoice from [facility]. Please contact [facility] at [phone] with questions or concerns regarding your invoice.   Our billing staff will not be able to assist you with questions regarding bills from these companies.  You will be contacted with the lab results as soon as they are available. The fastest way to get your results is to activate your My Chart account. Instructions are located on the last page of this paperwork. If you have not heard from [place] regarding the results in 2 weeks, please contact this office.

## 2019-09-19 NOTE — Progress Notes (Signed)
Subjective:  Patient ID: Yusuf Yu, male    DOB: 19-Feb-1961  Age: 59 y.o. MRN: [id number] CC:  Chief Complaint  Patient presents with  . Establish Care    pt reports as far as his general health he feels good, but pt is having a bloating and constipation issue. pt reports no pain and the pt states he deficates every 3-4 days. pt rep[orts this started in Feb of 2021. Pt states he noticed this issue after going to an urgent care for backpain and he was given a staroid. pt reports he has been eating plenty of vegtables, but it dosen't help.    HPI Dequandre Cordova presents for   Establish care, with complaints of bloating/constipation.  Issue since January of this year after evaluation for back pain treatment with steroids. BM every 3-4 days. Sometimes hard stool, not always. Has to strain.  Seen by urgent care twice for this issue. Had xray, told was ok - advised more fiber. Has added more vegetables, fruit and water. Stomach bloated at times without BM. Feels bloated today. No belching. More flatus with exercise (4 days per week walking, new past month). Tx: none  No alcohol, no soft drinks usually.  No unexplained wt loss or fever.  2-3 20oz bottles of water per day. Urinating more. Nocturia 5 times. (only once yesterday as only 1 bottle per water). No FH of prostate CA.  Last bm yesterday - hard stool.   Colonoscopy April 12, diverticulosis with large internal hemorrhoids, prolapsing, and small external hemorrhoids.  Some bleeding with BM at times - not always.   CT abd pelvis without acute abnormality on 05/01/19. Stomach/Bowel: The stomach appears normal. There is no evidence of bowel obstruction or inflammation. Status post appendectomy.  There was a concern of bleeding previously, abnormal PTT, but repeat CBC was normal when evaluated by Dr. Irene Limbo.   Fasting today.   Travel to [place] around July 16th.   There is no immunization history on file for this patient. Had covid vaccine -  2nd shot 2 weeks ago.    History Patient Active Problem List   Diagnosis Date Noted  . Shortness of breath 11/12/2016  . Chest pain 11/12/2016  . Heart palpitations 10/20/2013  . Situational anxiety 10/20/2013  . Migraine with visual aura 08/24/2013  . TIA (transient ischemic attack) 08/23/2013  . Blurred vision 08/23/2013  . Anomia 08/23/2013   Past Medical History:  Diagnosis Date  . Complicated migraine   . Diverticulosis   . External hemorrhoids   . External hemorrhoids   . Headache   . Hx of cardiovascular stress test    Myoview 8/10:  no ischemia, EF 62%; low risk  . Hx of Doppler ultrasound    Carotid US 6/15:  bilat ICA 1-39%  . Hx of echocardiogram    echo 6/15:  EF 60-65%, no RWMA  . Internal hemorrhoids   . Palpitations   . Stroke ([place])   . TIA (transient ischemic attack)   . Tubular adenoma of colon   . Vision abnormalities    Past Surgical History:  Procedure Laterality Date  . APPENDECTOMY  12/01/11   lap appy  . LAPAROSCOPIC APPENDECTOMY  12/02/2011   Procedure: APPENDECTOMY LAPAROSCOPIC;  Surgeon: Madilyn Hook, DO;  Location: [facility];  Service: General;  Laterality: N/A;   Allergies  Allergen Reactions  . Wellbutrin [Bupropion Hcl] Itching and Rash   Prior to Admission medications   Not on File   Social History  Socioeconomic History  . Marital status: Married    Spouse name: Not on file  . Number of children: Not on file  . Years of education: Not on file  . Highest education level: Not on file  Occupational History  . Not on file  Tobacco Use  . Smoking status: Never Smoker  . Smokeless tobacco: Never Used  Vaping Use  . Vaping Use: Never used  Substance and Sexual Activity  . Alcohol use: No  . Drug use: No  . Sexual activity: Not Currently  Other Topics Concern  . Not on file  Social History Narrative  . Not on file   Social Determinants of Health   Financial Resource Strain:   . Difficulty of Paying Living Expenses:   Food  Insecurity:   . Worried About Charity fundraiser in the Last Year:   . Arboriculturist in the Last Year:   Transportation Needs:   . Film/video editor (Medical):   Marland Kitchen Lack of Transportation (Non-Medical):   Physical Activity:   . Days of Exercise per Week:   . Minutes of Exercise per Session:   Stress:   . Feeling of Stress :   Social Connections:   . Frequency of Communication with Friends and Family:   . Frequency of Social Gatherings with Friends and Family:   . Attends Religious Services:   . Active Member of Clubs or Organizations:   . Attends Archivist Meetings:   Marland Kitchen Marital Status:   Intimate Partner Violence:   . Fear of Current or Ex-Partner:   . Emotionally Abused:   Marland Kitchen Physically Abused:   . Sexually Abused:     Review of Systems Per HPI.   Objective:   Vitals:   09/19/19 0802  BP: 130/89  Pulse: 65  Temp: 98.2 F (36.8 C)  TempSrc: Temporal  SpO2: 96%  Weight: 170 lb (77.1 kg)  Height: 5\' 7"  (1.702 m)     Physical Exam Vitals reviewed.  Constitutional:      Appearance: He is well-developed.  HENT:     Head: Normocephalic and atraumatic.  Eyes:     Pupils: Pupils are equal, round, and reactive to light.  Neck:     Vascular: No carotid bruit or JVD.  Cardiovascular:     Rate and Rhythm: Normal rate and regular rhythm.     Heart sounds: Normal heart sounds. No murmur heard.   Pulmonary:     Effort: Pulmonary effort is normal.     Breath sounds: Normal breath sounds. No rales.  Abdominal:     General: There is no distension.     Palpations: There is no mass.     Tenderness: There is abdominal tenderness (min LLQ, no rebound/guarding. ). There is no right CVA tenderness, left CVA tenderness, guarding or rebound.     Hernia: No hernia is present.  Skin:    General: Skin is warm and dry.  Neurological:     Mental Status: He is alert and oriented to person, place, and time.  Psychiatric:        Mood and Affect: Mood normal.         Assessment & Plan:  Jordani Nunn is a 59 y.o. male . Abdominal bloating Constipation, unspecified constipation type  -Recurrent issue.  Reassuring previous imaging with CT as well as colonoscopy earlier this year.  Suspect component of constipation, also as evidenced with previous hemorrhoids.    -Commended on  water intake  but recommend earlier in the day, less before bedtime to decrease nocturia.  Will check PSA  -Continue fruits and vegetables, and Citrucel or Metamucil once per day.  Option of MiraLAX if not improving.  Recheck 2 to 3 weeks.  Consider gastroenterology eval if persists.   -Check TSH  Screening, anemia, deficiency, iron - Plan: CBC  Screening for hyperlipidemia - Plan: Lipid panel  Screening for diabetes mellitus - Plan: Comprehensive metabolic panel  Nocturia Encounter for screening for malignant neoplasm of prostate - Plan: PSA  -Last with increased water intake suspect timing of water intake may be an issue.  Check PSA, water earlier in the day.  Potential causes of PSA elevation discussed if it is elevated, risks/benefits of testing discussed.  Screening for thyroid disorder - Plan: TSH   No orders of the defined types were placed in this encounter.  Patient Instructions       If you have lab work done today you will be contacted with your lab results within the next 2 weeks.  If you have not heard from [place] then please contact us. The fastest way to get your results is to register for My Chart.   IF you received an x-ray today, you will receive an invoice from [facility]. Please contact [facility] at [phone] with questions or concerns regarding your invoice.   IF you received labwork today, you will receive an invoice from [facility]. Please contact [facility] at [phone] with questions or concerns regarding your invoice.   Our billing staff will not be able to assist you with questions regarding bills from these  companies.  You will be contacted with the lab results as soon as they are available. The fastest way to get your results is to activate your My Chart account. Instructions are located on the last page of this paperwork. If you have not heard from [place] regarding the results in 2 weeks, please contact this office.         Signed, Merri Ray, MD [facility]

## 2019-09-20 LAB — CBC
Hematocrit: 44.7 % (ref 37.5–51.0)
Hemoglobin: 15.4 g/dL (ref 13.0–17.7)
MCH: 30.5 pg (ref 26.6–33.0)
MCHC: 34.5 g/dL (ref 31.5–35.7)
MCV: 89 fL (ref 79–97)
Platelets: 253 10*3/uL (ref 150–450)
RBC: 5.05 x10E6/uL (ref 4.14–5.80)
RDW: 12.6 % (ref 11.6–15.4)
WBC: 7.1 10*3/uL (ref 3.4–10.8)

## 2019-09-20 LAB — COMPREHENSIVE METABOLIC PANEL
ALT: 29 IU/L (ref 0–44)
AST: 25 IU/L (ref 0–40)
Albumin/Globulin Ratio: 1.5 (ref 1.2–2.2)
Albumin: 4.2 g/dL (ref 3.8–4.9)
Alkaline Phosphatase: 62 IU/L (ref 48–121)
BUN/Creatinine Ratio: 17 (ref 9–20)
BUN: 16 mg/dL (ref 6–24)
Bilirubin Total: 1.1 mg/dL (ref 0.0–1.2)
CO2: 22 mmol/L (ref 20–29)
Calcium: 9.4 mg/dL (ref 8.7–10.2)
Chloride: 105 mmol/L (ref 96–106)
Creatinine, Ser: 0.96 mg/dL (ref 0.76–1.27)
GFR calc Af Amer: 101 mL/min/{1.73_m2} (ref >59)
GFR calc non Af Amer: 87 mL/min/{1.73_m2} (ref >59)
Globulin, Total: 2.8 g/dL (ref 1.5–4.5)
Glucose: 101 mg/dL — ABNORMAL HIGH (ref 65–99)
Potassium: 4.4 mmol/L (ref 3.5–5.2)
Sodium: 140 mmol/L (ref 134–144)
Total Protein: 7 g/dL (ref 6.0–8.5)

## 2019-09-20 LAB — LIPID PANEL
Chol/HDL Ratio: 5.2 ratio — ABNORMAL HIGH (ref 0.0–5.0)
Cholesterol, Total: 245 mg/dL — ABNORMAL HIGH (ref 100–199)
HDL: 47 mg/dL (ref >39)
LDL Chol Calc (NIH): 161 mg/dL — ABNORMAL HIGH (ref 0–99)
Triglycerides: 199 mg/dL — ABNORMAL HIGH (ref 0–149)
VLDL Cholesterol Cal: 37 mg/dL (ref 5–40)

## 2019-09-20 LAB — PSA: Prostate Specific Ag, Serum: 1.2 ng/mL (ref 0.0–4.0)

## 2019-09-20 LAB — TSH: TSH: 3.57 u[IU]/mL (ref 0.450–4.500)

## 2019-10-03 ENCOUNTER — Encounter: Payer: Self-pay | Admitting: Family Medicine

## 2019-10-03 ENCOUNTER — Other Ambulatory Visit: Payer: Self-pay

## 2019-10-03 ENCOUNTER — Ambulatory Visit (INDEPENDENT_AMBULATORY_CARE_PROVIDER_SITE_OTHER): Payer: 59 | Admitting: Family Medicine

## 2019-10-03 VITALS — BP 118/76 | HR 67 | Temp 98.1°F | Ht 67.0 in | Wt 172.0 lb

## 2019-10-03 DIAGNOSIS — E785 Hyperlipidemia, unspecified: Secondary | ICD-10-CM

## 2019-10-03 DIAGNOSIS — K5900 Constipation, unspecified: Secondary | ICD-10-CM | POA: Diagnosis not present

## 2019-10-03 DIAGNOSIS — R140 Abdominal distension (gaseous): Secondary | ICD-10-CM

## 2019-10-03 DIAGNOSIS — R351 Nocturia: Secondary | ICD-10-CM | POA: Diagnosis not present

## 2019-10-03 DIAGNOSIS — R739 Hyperglycemia, unspecified: Secondary | ICD-10-CM

## 2019-10-03 LAB — POCT CBC
Granulocyte percent: 70 %G (ref 37–80)
HCT, POC: 45.8 % — AB (ref 29–41)
Hemoglobin: 15.2 g/dL — AB (ref 11–14.6)
Lymph, poc: 2.2 (ref 0.6–3.4)
MCH, POC: 30 pg (ref 27–31.2)
MCHC: 33.3 g/dL (ref 31.8–35.4)
MCV: 90.1 fL (ref 76–111)
MID (cbc): 0.5 (ref 0–0.9)
MPV: 7.2 fL (ref 0–99.8)
POC Granulocyte: 6.2 (ref 2–6.9)
POC LYMPH PERCENT: 24.5 %L (ref 10–50)
POC MID %: 5.5 %M (ref 0–12)
Platelet Count, POC: 288 10*3/uL (ref 142–424)
RBC: 5.08 M/uL (ref 4.69–6.13)
RDW, POC: 13.4 %
WBC: 8.9 10*3/uL (ref 4.6–10.2)

## 2019-10-03 LAB — POCT URINALYSIS DIP (MANUAL ENTRY)
Bilirubin, UA: NEGATIVE
Blood, UA: NEGATIVE
Glucose, UA: NEGATIVE mg/dL
Ketones, POC UA: NEGATIVE mg/dL
Leukocytes, UA: NEGATIVE
Nitrite, UA: NEGATIVE
Protein Ur, POC: NEGATIVE mg/dL
Spec Grav, UA: 1.025 (ref 1.010–1.025)
Urobilinogen, UA: 0.2 E.U./dL
pH, UA: 6.5 (ref 5.0–8.0)

## 2019-10-03 NOTE — Patient Instructions (Addendum)
Try miralax once per day to see if that will help with bowel movements daily. Stop if any diarrhea.  Ok to continue fiber supplement daily.  Once bowel movements become regular and stay regular, can stop miralax and just fiber supplement.  Call gastroenterologist for follow up appointment to discuss your continued bloating.   Cholesterol elevated, but not high enough to recommend medicine at this time. Recheck cholesterol in 6 months. We can recheck blood sugar then as well.   I will refer you to urologist for nighttime urination.   Return to the clinic or go to the nearest emergency room if any of your symptoms worsen or new symptoms occur.   Abdominal Bloating When you have abdominal bloating, your abdomen may feel full, tight, or painful. It may also look bigger than normal or swollen (distended). Common causes of abdominal bloating include:  Swallowing air.  Constipation.  Problems digesting food.  Eating too much.  Irritable bowel syndrome. This is a condition that affects the large intestine.  Lactose intolerance. This is an inability to digest lactose, a natural sugar in dairy products.  Celiac disease. This is a condition that affects the ability to digest gluten, a protein found in some grains.  Gastroparesis. This is a condition that slows down the movement of food in the stomach and small intestine. It is more common in people with diabetes mellitus.  Gastroesophageal reflux disease (GERD). This is a digestive condition that makes stomach acid flow back into the esophagus.  Urinary retention. This means that the body is holding onto urine, and the bladder cannot be emptied all the way. Follow these instructions at home: Eating and drinking  Avoid eating too much.  Try not to swallow air while talking or eating.  Avoid eating while lying down.  Avoid these foods and drinks: ? Foods that cause gas, such as broccoli, cabbage, cauliflower, and baked  beans. ? Carbonated drinks. ? Hard candy. ? Chewing gum. Medicines  Take over-the-counter and prescription medicines only as told by your health care provider.  Take probiotic medicines. These medicines contain live bacteria or yeasts that can help digestion.  Take coated peppermint oil capsules. Activity  Try to exercise regularly. Exercise may help to relieve bloating that is caused by gas and relieve constipation. General instructions  Keep all follow-up visits as told by your health care provider. This is important. Contact a health care provider if:  You have nausea and vomiting.  You have diarrhea.  You have abdominal pain.  You have unusual weight loss or weight gain.  You have severe pain, and medicines do not help. Get help right away if:  You have severe chest pain.  You have trouble breathing.  You have shortness of breath.  You have trouble urinating.  You have darker urine than normal.  You have blood in your stools or have dark, tarry stools. Summary  Abdominal bloating means that the abdomen is swollen.  Common causes of abdominal bloating are swallowing air, constipation, and problems digesting food.  Avoid eating too much and avoid swallowing air.  Avoid foods that cause gas, carbonated drinks, hard candy, and chewing gum. This information is not intended to replace advice given to you by your health care provider. Make sure you discuss any questions you have with your health care provider. Document Revised: 06/28/2018 Document Reviewed: 04/11/2016 Elsevier Patient Education  [employer].    If you have lab work done today you will be contacted with your lab  results within the next 2 weeks.  If you have not heard from [place] then please contact us. The fastest way to get your results is to register for My Chart.   IF you received an x-ray today, you will receive an invoice from [facility]. Please contact [facility] at  [phone] with questions or concerns regarding your invoice.   IF you received labwork today, you will receive an invoice from [facility]. Please contact [facility] at [phone] with questions or concerns regarding your invoice.   Our billing staff will not be able to assist you with questions regarding bills from these companies.  You will be contacted with the lab results as soon as they are available. The fastest way to get your results is to activate your My Chart account. Instructions are located on the last page of this paperwork. If you have not heard from [place] regarding the results in 2 weeks, please contact this office.

## 2019-10-03 NOTE — Progress Notes (Signed)
Subjective:  Patient ID: Todd [name], male    DOB: October 04, 1960  Age: 59 y.o. MRN: [id number]  CC:  Chief Complaint  Patient presents with  . Follow-up    on abdominal bloating and nocternalurinatia. Pt reports the bloating comes and goes, but no pain, but pt is constipated he states he deficates a minimum once every 3 days. pt is also still urinating alot at night pt repoorts a minnimum of 4x a night, but pt drinks alot of water.    HPI Todd [name] presents for   Abdominal bloating: Evaluated June 28, symptoms since January.  Bowel movement every 3 to 4 days, occasional hard stool.  Colonoscopy April 12 with diverticulosis, margin, hemorrhoids, prolapsing with small external hemorrhoids.  CT abdomen pelvis without acute abnormality in February. Recommend initial Citrucel or Metamucil daily, then if not effective for bowel movements recommended MiraLAX daily.  Increase water, vegetables and fruit in the diet for natural fiber. Recent lab work with CMP, CBC, TSH, PSA, lipids overall reassuring with hyperlipidemia noted, borderline glucose only.  Fiber supplement every night - unknown name. Has not tried miralax. Went 5 days after last visit without BM, then BM 2 days in a row, not hard stool. Some blood after BM. Some straining at the end of BM.  3 days later another BM, some straining and bright red blood, another 3 days later had BM (2 days ago).  Still bloated at times - comes and goes. Better after bowel movements. Seems flat in the morning.  No carbonated beverages.  No fever.  No pain, just bloating. More sore past few months.  No fever, no dysuria.  Still urinating 4-5 times per night. Psa nl last visit.  No daytime urinary symptoms - no dysuria, no hematuria.  GI: [facility], saw in April - referred to surgeon for hemorrhoids (he would like to consider rubber bad treatment).   Results for orders placed or performed in visit on 10/03/19  POCT CBC  Result Value Ref Range   WBC 8.9  4.6 - 10.2 K/uL   Lymph, poc 2.2 0.6 - 3.4   POC LYMPH PERCENT 24.5 10 - 50 %L   MID (cbc) 0.5 0 - 0.9   POC MID % 5.5 0 - 12 %M   POC Granulocyte 6.2 2 - 6.9   Granulocyte percent 70.0 37 - 80 %G   RBC 5.08 4.69 - 6.13 M/uL   Hemoglobin 15.2 (A) 11 - 14.6 g/dL   HCT, POC 45.8 (A) 29 - 41 %   MCV 90.1 76 - 111 fL   MCH, POC 30.0 27 - 31.2 pg   MCHC 33.3 31.8 - 35.4 g/dL   RDW, POC 13.4 %   Platelet Count, POC 288 142 - 424 K/uL   MPV 7.2 0 - 99.8 fL  POCT urinalysis dipstick  Result Value Ref Range   Color, UA yellow yellow   Clarity, UA clear clear   Glucose, UA negative negative mg/dL   Bilirubin, UA negative negative   Ketones, POC UA negative negative mg/dL   Spec Grav, UA 1.025 1.010 - 1.025   Blood, UA negative negative   pH, UA 6.5 5.0 - 8.0   Protein Ur, POC negative negative mg/dL   Urobilinogen, UA 0.2 0.2 or 1.0 E.U./dL   Nitrite, UA Negative Negative   Leukocytes, UA Negative Negative   The 10-year ASCVD risk score Mikey Bussing DC Jr., et al., 2013) is: 7.6%   Values used to calculate the score:  Age: 85 years     Sex: Male     Is Non-Hispanic African American: No     Diabetic: No     Tobacco smoker: No     Systolic Blood Pressure: 130 mmHg     Is BP treated: No     HDL Cholesterol: 47 mg/dL     Total Cholesterol: 245 mg/dL   History Patient Active Problem List   Diagnosis Date Noted  . Shortness of breath 11/12/2016  . Chest pain 11/12/2016  . Heart palpitations 10/20/2013  . Situational anxiety 10/20/2013  . Migraine with visual aura 08/24/2013  . TIA (transient ischemic attack) 08/23/2013  . Blurred vision 08/23/2013  . Anomia 08/23/2013   Past Medical History:  Diagnosis Date  . Complicated migraine   . Diverticulosis   . External hemorrhoids   . External hemorrhoids   . Headache   . Hx of cardiovascular stress test    Myoview 8/10:  no ischemia, EF 62%; low risk  . Hx of Doppler ultrasound    Carotid US 6/15:  bilat ICA 1-39%  . Hx of  echocardiogram    echo 6/15:  EF 60-65%, no RWMA  . Internal hemorrhoids   . Palpitations   . Stroke ([place])   . TIA (transient ischemic attack)   . Tubular adenoma of colon   . Vision abnormalities    Past Surgical History:  Procedure Laterality Date  . APPENDECTOMY  12/01/11   lap appy  . LAPAROSCOPIC APPENDECTOMY  12/02/2011   Procedure: APPENDECTOMY LAPAROSCOPIC;  Surgeon: Madilyn Hook, DO;  Location: [facility];  Service: General;  Laterality: N/A;   Allergies  Allergen Reactions  . Wellbutrin [Bupropion Hcl] Itching and Rash   Prior to Admission medications   Not on File   Social History   Socioeconomic History  . Marital status: Married    Spouse name: Not on file  . Number of children: Not on file  . Years of education: Not on file  . Highest education level: Not on file  Occupational History  . Not on file  Tobacco Use  . Smoking status: Never Smoker  . Smokeless tobacco: Never Used  Vaping Use  . Vaping Use: Never used  Substance and Sexual Activity  . Alcohol use: No  . Drug use: No  . Sexual activity: Not Currently  Other Topics Concern  . Not on file  Social History Narrative  . Not on file   Social Determinants of Health   Financial Resource Strain:   . Difficulty of Paying Living Expenses:   Food Insecurity:   . Worried About Charity fundraiser in the Last Year:   . Arboriculturist in the Last Year:   Transportation Needs:   . Film/video editor (Medical):   Marland Kitchen Lack of Transportation (Non-Medical):   Physical Activity:   . Days of Exercise per Week:   . Minutes of Exercise per Session:   Stress:   . Feeling of Stress :   Social Connections:   . Frequency of Communication with Friends and Family:   . Frequency of Social Gatherings with Friends and Family:   . Attends Religious Services:   . Active Member of Clubs or Organizations:   . Attends Archivist Meetings:   Marland Kitchen Marital Status:   Intimate Partner Violence:   . Fear of  Current or Ex-Partner:   . Emotionally Abused:   Marland Kitchen Physically Abused:   . Sexually Abused:  Review of Systems Per HPI.   Objective:   Vitals:   10/03/19 1621  BP: 118/76  Pulse: 67  Temp: 98.1 F (36.7 C)  TempSrc: Temporal  SpO2: 97%  Weight: 172 lb (78 kg)  Height: 5\' 7"  (1.702 m)     Physical Exam Vitals reviewed.  Constitutional:      Appearance: He is well-developed.  HENT:     Head: Normocephalic and atraumatic.  Eyes:     Pupils: Pupils are equal, round, and reactive to light.  Neck:     Vascular: No carotid bruit or JVD.  Cardiovascular:     Rate and Rhythm: Normal rate and regular rhythm.     Heart sounds: Normal heart sounds. No murmur heard.   Pulmonary:     Effort: Pulmonary effort is normal.     Breath sounds: Normal breath sounds. No rales.  Abdominal:     General: Abdomen is flat. Bowel sounds are normal. There is no distension.     Palpations: There is no mass.     Tenderness: There is abdominal tenderness (RLQ, LLQ. ). There is no guarding or rebound.  Skin:    General: Skin is warm and dry.  Neurological:     Mental Status: He is alert and oriented to person, place, and time.    Results for orders placed or performed in visit on 10/03/19  POCT CBC  Result Value Ref Range   WBC 8.9 4.6 - 10.2 K/uL   Lymph, poc 2.2 0.6 - 3.4   POC LYMPH PERCENT 24.5 10 - 50 %L   MID (cbc) 0.5 0 - 0.9   POC MID % 5.5 0 - 12 %M   POC Granulocyte 6.2 2 - 6.9   Granulocyte percent 70.0 37 - 80 %G   RBC 5.08 4.69 - 6.13 M/uL   Hemoglobin 15.2 (A) 11 - 14.6 g/dL   HCT, POC 45.8 (A) 29 - 41 %   MCV 90.1 76 - 111 fL   MCH, POC 30.0 27 - 31.2 pg   MCHC 33.3 31.8 - 35.4 g/dL   RDW, POC 13.4 %   Platelet Count, POC 288 142 - 424 K/uL   MPV 7.2 0 - 99.8 fL  POCT urinalysis dipstick  Result Value Ref Range   Color, UA yellow yellow   Clarity, UA clear clear   Glucose, UA negative negative mg/dL   Bilirubin, UA negative negative   Ketones, POC UA  negative negative mg/dL   Spec Grav, UA 1.025 1.010 - 1.025   Blood, UA negative negative   pH, UA 6.5 5.0 - 8.0   Protein Ur, POC negative negative mg/dL   Urobilinogen, UA 0.2 0.2 or 1.0 E.U./dL   Nitrite, UA Negative Negative   Leukocytes, UA Negative Negative     Assessment & Plan:  Todd [name] is a 59 y.o. male . Nocturia - Plan: POCT urinalysis dipstick, Ambulatory referral to Urology, CANCELED: POCT Microscopic Urinalysis ([facility])  -Plans to decrease fluid intake prior to bedtime, but has had persistent 4-5 times per night nocturia.  PSA recently was reassuring.  In office urinalysis reassuring.  Urology referral placed  Abdominal bloating - Plan: POCT CBC, POCT urinalysis dipstick, CANCELED: POCT Microscopic Urinalysis ([facility]) Infrequent bowel movements - Plan: POCT CBC  -Previous imaging and colonoscopy was reassuring.  Still suspect some constipation component to the bloating.  Has been using fiber daily, but recommended MiraLAX at this point.  Some lower abdominal discomfort, but reassuring CBC, afebrile, ER  precautions given, recommended gastroenterology follow-up as well.  Hyperlipidemia discussed, borderline ASCVD risk score, recheck in 6 months, along with blood sugar as borderline hyperglycemia.  No orders of the defined types were placed in this encounter.  Patient Instructions    Try miralax once per day to see if that will help with bowel movements daily. Stop if any diarrhea.  Ok to continue fiber supplement daily.  Once bowel movements become regular and stay regular, can stop miralax and just fiber supplement.  Call gastroenterologist for follow up appointment to discuss your continued bloating.   Cholesterol elevated, but not high enough to recommend medicine at this time. Recheck cholesterol in 6 months. We can recheck blood sugar then as well.   I will refer you to urologist for nighttime urination.   Return to the clinic or go to the nearest emergency room  if any of your symptoms worsen or new symptoms occur.   Abdominal Bloating When you have abdominal bloating, your abdomen may feel full, tight, or painful. It may also look bigger than normal or swollen (distended). Common causes of abdominal bloating include:  Swallowing air.  Constipation.  Problems digesting food.  Eating too much.  Irritable bowel syndrome. This is a condition that affects the large intestine.  Lactose intolerance. This is an inability to digest lactose, a natural sugar in dairy products.  Celiac disease. This is a condition that affects the ability to digest gluten, a protein found in some grains.  Gastroparesis. This is a condition that slows down the movement of food in the stomach and small intestine. It is more common in people with diabetes mellitus.  Gastroesophageal reflux disease (GERD). This is a digestive condition that makes stomach acid flow back into the esophagus.  Urinary retention. This means that the body is holding onto urine, and the bladder cannot be emptied all the way. Follow these instructions at home: Eating and drinking  Avoid eating too much.  Try not to swallow air while talking or eating.  Avoid eating while lying down.  Avoid these foods and drinks: ? Foods that cause gas, such as broccoli, cabbage, cauliflower, and baked beans. ? Carbonated drinks. ? Hard candy. ? Chewing gum. Medicines  Take over-the-counter and prescription medicines only as told by your health care provider.  Take probiotic medicines. These medicines contain live bacteria or yeasts that can help digestion.  Take coated peppermint oil capsules. Activity  Try to exercise regularly. Exercise may help to relieve bloating that is caused by gas and relieve constipation. General instructions  Keep all follow-up visits as told by your health care provider. This is important. Contact a health care provider if:  You have nausea and vomiting.  You  have diarrhea.  You have abdominal pain.  You have unusual weight loss or weight gain.  You have severe pain, and medicines do not help. Get help right away if:  You have severe chest pain.  You have trouble breathing.  You have shortness of breath.  You have trouble urinating.  You have darker urine than normal.  You have blood in your stools or have dark, tarry stools. Summary  Abdominal bloating means that the abdomen is swollen.  Common causes of abdominal bloating are swallowing air, constipation, and problems digesting food.  Avoid eating too much and avoid swallowing air.  Avoid foods that cause gas, carbonated drinks, hard candy, and chewing gum. This information is not intended to replace advice given to you by your health care  provider. Make sure you discuss any questions you have with your health care provider. Document Revised: 06/28/2018 Document Reviewed: 04/11/2016 Elsevier Patient Education  [employer].    If you have lab work done today you will be contacted with your lab results within the next 2 weeks.  If you have not heard from [place] then please contact us. The fastest way to get your results is to register for My Chart.   IF you received an x-ray today, you will receive an invoice from [facility]. Please contact [facility] at [phone] with questions or concerns regarding your invoice.   IF you received labwork today, you will receive an invoice from [place]. Please contact [facility] at [phone] with questions or concerns regarding your invoice.   Our billing staff will not be able to assist you with questions regarding bills from these companies.  You will be contacted with the lab results as soon as they are available. The fastest way to get your results is to activate your My Chart account. Instructions are located on the last page of this paperwork. If you have not heard from [place] regarding the results in 2 weeks,  please contact this office.         Signed, Merri Ray, MD [facility]

## 2019-10-04 ENCOUNTER — Institutional Professional Consult (permissible substitution): Payer: 59 | Admitting: Neurology

## 2019-10-06 ENCOUNTER — Telehealth: Payer: Self-pay | Admitting: Family Medicine

## 2019-10-06 ENCOUNTER — Telehealth: Payer: Self-pay

## 2019-10-06 NOTE — Telephone Encounter (Signed)
I spoke with patient and he is requesting a hemorrhoid cream. Patient was seen by you on 10/03/19 and forgot to ask for it when he was seen on 10/03/19

## 2019-10-06 NOTE — Telephone Encounter (Signed)
Pt called and stated when he came in on Monday he forgot to ask provider to send him in some hidroid cream Pt requested this type of medication be sent in.  Hydrocortisone  Please advise.

## 2019-10-06 NOTE — Telephone Encounter (Signed)
Pt. Called requesting refill of hydrocortisone cream for hemorrhoids, to be sent to pharmacy on file if filled by Dr. Carlota Raspberry

## 2019-10-06 NOTE — Telephone Encounter (Signed)
We did discuss some of his symptoms after bowel movements at last visit.  Blood or discomfort in that area could be from either anal fissure or hemorrhoids.  Has he noted any swelling or hemorrhoids in the area?(as that would be reasonable to try hydrocortisone).  If not, could try topical lidocaine to see if that helps for possible fissure.  That could also help the discomfort of a  hemorrhoid.  Let me know and I can send in a medication temporarily.

## 2019-10-07 ENCOUNTER — Other Ambulatory Visit: Payer: Self-pay | Admitting: Emergency Medicine

## 2019-10-07 MED ORDER — HYDROCORT-PRAMOXINE (PERIANAL) 2.5-1 % EX CREA: 1.0000 "application " | TOPICAL_CREAM | Freq: Three times a day (TID) | CUTANEOUS | 0 refills | Status: DC

## 2019-10-07 NOTE — Telephone Encounter (Signed)
Medication sent.

## 2019-10-07 NOTE — Telephone Encounter (Signed)
This msg was already sent to Dr Carlota Raspberry waiting on his approval

## 2019-10-07 NOTE — Telephone Encounter (Signed)
Spoke to patient and he do have swelling in that area and would like for you to send in the hydrocortisone cream

## 2019-11-15 ENCOUNTER — Institutional Professional Consult (permissible substitution): Payer: 59 | Admitting: Neurology

## 2019-12-26 ENCOUNTER — Encounter: Payer: Self-pay | Admitting: Neurology

## 2019-12-26 ENCOUNTER — Institutional Professional Consult (permissible substitution): Payer: 59 | Admitting: Neurology

## 2020-02-02 ENCOUNTER — Other Ambulatory Visit: Payer: Self-pay

## 2020-02-02 ENCOUNTER — Ambulatory Visit (INDEPENDENT_AMBULATORY_CARE_PROVIDER_SITE_OTHER): Payer: 59 | Admitting: Family Medicine

## 2020-02-02 ENCOUNTER — Encounter: Payer: Self-pay | Admitting: Family Medicine

## 2020-02-02 VITALS — BP 118/77 | HR 74 | Temp 98.2°F | Resp 16 | Ht 67.0 in | Wt 158.0 lb

## 2020-02-02 DIAGNOSIS — L03311 Cellulitis of abdominal wall: Secondary | ICD-10-CM

## 2020-02-02 MED ORDER — DOXYCYCLINE HYCLATE 100 MG PO TABS: 100.0000 mg | ORAL_TABLET | Freq: Two times a day (BID) | ORAL | 0 refills | Status: DC

## 2020-02-02 NOTE — Progress Notes (Signed)
Subjective:  Patient ID: Todd [name], male    DOB: March 24, 1961  Age: 59 y.o. MRN: [id number]  CC:  Chief Complaint  Patient presents with  . Mass    pt reports lump on lower abdomen red swollen denies pain, denies itching and irritation     HPI Todd [name] presents for  Lower abdominal lesion.  Started 10 days ago - lower mid abdomen, redness around it few days later.  Redness has increased initially -now same size. Possible slight decrease in center part - minimal change.  No f/c.  No discharge.  No treatments.   History Patient Active Problem List   Diagnosis Date Noted  . Shortness of breath 11/12/2016  . Chest pain 11/12/2016  . Heart palpitations 10/20/2013  . Situational anxiety 10/20/2013  . Migraine with visual aura 08/24/2013  . TIA (transient ischemic attack) 08/23/2013  . Blurred vision 08/23/2013  . Anomia 08/23/2013   Past Medical History:  Diagnosis Date  . Complicated migraine   . Diverticulosis   . External hemorrhoids   . External hemorrhoids   . Headache   . Hx of cardiovascular stress test    Myoview 8/10:  no ischemia, EF 62%; low risk  . Hx of Doppler ultrasound    Carotid US 6/15:  bilat ICA 1-39%  . Hx of echocardiogram    echo 6/15:  EF 60-65%, no RWMA  . Internal hemorrhoids   . Palpitations   . Stroke ([place])   . TIA (transient ischemic attack)   . Tubular adenoma of colon   . Vision abnormalities    Past Surgical History:  Procedure Laterality Date  . APPENDECTOMY  12/01/11   lap appy  . LAPAROSCOPIC APPENDECTOMY  12/02/2011   Procedure: APPENDECTOMY LAPAROSCOPIC;  Surgeon: Madilyn Hook, DO;  Location: [facility];  Service: General;  Laterality: N/A;   Allergies  Allergen Reactions  . Wellbutrin [Bupropion Hcl] Itching and Rash   Prior to Admission medications   Medication Sig Start Date End Date Taking? Authorizing Provider  hydrocortisone-pramoxine (ANALPRAM HC) 2.5-1 % rectal cream Place 1 application rectally 3 (three) times  daily. Patient not taking: Reported on 02/02/2020 10/07/19   Wendie Agreste, MD   Social History   Socioeconomic History  . Marital status: Married    Spouse name: Not on file  . Number of children: Not on file  . Years of education: Not on file  . Highest education level: Not on file  Occupational History  . Not on file  Tobacco Use  . Smoking status: Never Smoker  . Smokeless tobacco: Never Used  Vaping Use  . Vaping Use: Never used  Substance and Sexual Activity  . Alcohol use: No  . Drug use: No  . Sexual activity: Not Currently  Other Topics Concern  . Not on file  Social History Narrative  . Not on file   Social Determinants of Health   Financial Resource Strain:   . Difficulty of Paying Living Expenses: Not on file  Food Insecurity:   . Worried About Charity fundraiser in the Last Year: Not on file  . Ran Out of Food in the Last Year: Not on file  Transportation Needs:   . Lack of Transportation (Medical): Not on file  . Lack of Transportation (Non-Medical): Not on file  Physical Activity:   . Days of Exercise per Week: Not on file  . Minutes of Exercise per Session: Not on file  Stress:   . Feeling of  Stress : Not on file  Social Connections:   . Frequency of Communication with Friends and Family: Not on file  . Frequency of Social Gatherings with Friends and Family: Not on file  . Attends Religious Services: Not on file  . Active Member of Clubs or Organizations: Not on file  . Attends Archivist Meetings: Not on file  . Marital Status: Not on file  Intimate Partner Violence:   . Fear of Current or Ex-Partner: Not on file  . Emotionally Abused: Not on file  . Physically Abused: Not on file  . Sexually Abused: Not on file    Review of Systems Per HPI.   Objective:   Vitals:   02/02/20 1701  BP: 118/77  Pulse: 74  Resp: 16  Temp: 98.2 F (36.8 C)  TempSrc: Temporal  SpO2: 98%  Weight: 158 lb (71.7 kg)  Height: 5\' 7"  (1.702 m)      Physical Exam Constitutional:      General: He is not in acute distress.    Appearance: He is well-developed.  HENT:     Head: Normocephalic and atraumatic.  Cardiovascular:     Rate and Rhythm: Normal rate.  Pulmonary:     Effort: Pulmonary effort is normal.  Abdominal:     General: Abdomen is flat. There is no distension.     Tenderness: There is no abdominal tenderness.     Comments: Small papular area with min surrounding induration, darkening without warmth. No fluctuance.   Neurological:     Mental Status: He is alert and oriented to person, place, and time.      Assessment & Plan:  Todd [name] is a 59 y.o. male . Cellulitis of abdominal wall - Plan: doxycycline (VIBRA-TABS) 100 MG tablet -Suspected small abscess/cellulitis that is now improving.  No fluctuance, slight hyperpigmentation without appreciable erythema or warmth, doubt need for antibiotics at this time, but if erythema returns or any worsening can start doxycycline with in office eval within a day or 2.  Understanding expressed.  Meds ordered this encounter  Medications  . doxycycline (VIBRA-TABS) 100 MG tablet    Sig: Take 1 tablet (100 mg total) by mouth 2 (two) times daily.    Dispense:  14 tablet    Refill:  0   Patient Instructions    Bump on stomach with likely cellulitis or possibly an early abscess that appears to be improving at this time.  No antibiotics or treatment needed at this time as long as it continues to improve, but I did print an antibiotic in case redness returns or any increasing swelling in that area.  If that does happen, start antibiotic and follow-up with one of our providers within a day or 2 to look at that area further.  Thanks for coming in today.    If you have lab work done today you will be contacted with your lab results within the next 2 weeks.  If you have not heard from [place] then please contact us. The fastest way to get your results is to register for My  Chart.   IF you received an x-ray today, you will receive an invoice from [facility]. Please contact [facility] at [phone] with questions or concerns regarding your invoice.   IF you received labwork today, you will receive an invoice from [place]. Please contact [facility] at [phone] with questions or concerns regarding your invoice.   Our billing staff will not be able to assist you with  questions regarding bills from these companies.  You will be contacted with the lab results as soon as they are available. The fastest way to get your results is to activate your My Chart account. Instructions are located on the last page of this paperwork. If you have not heard from [place] regarding the results in 2 weeks, please contact this office.         Signed, Merri Ray, MD [facility]

## 2020-02-02 NOTE — Patient Instructions (Addendum)
  Bump on stomach with likely cellulitis or possibly an early abscess that appears to be improving at this time.  No antibiotics or treatment needed at this time as long as it continues to improve, but I did print an antibiotic in case redness returns or any increasing swelling in that area.  If that does happen, start antibiotic and follow-up with one of our providers within a day or 2 to look at that area further.  Thanks for coming in today.    If you have lab work done today you will be contacted with your lab results within the next 2 weeks.  If you have not heard from [place] then please contact us. The fastest way to get your results is to register for My Chart.   IF you received an x-ray today, you will receive an invoice from [facility]. Please contact [facility] at [phone] with questions or concerns regarding your invoice.   IF you received labwork today, you will receive an invoice from [place]. Please contact [facility] at [phone] with questions or concerns regarding your invoice.   Our billing staff will not be able to assist you with questions regarding bills from these companies.  You will be contacted with the lab results as soon as they are available. The fastest way to get your results is to activate your My Chart account. Instructions are located on the last page of this paperwork. If you have not heard from [place] regarding the results in 2 weeks, please contact this office.

## 2020-03-05 ENCOUNTER — Other Ambulatory Visit: Payer: Self-pay

## 2020-03-05 ENCOUNTER — Encounter (HOSPITAL_COMMUNITY): Payer: Self-pay | Admitting: Emergency Medicine

## 2020-03-05 ENCOUNTER — Telehealth: Payer: 59 | Admitting: Family Medicine

## 2020-03-05 ENCOUNTER — Ambulatory Visit (HOSPITAL_COMMUNITY)
Admission: EM | Admit: 2020-03-05 | Discharge: 2020-03-05 | Disposition: A | Discharge status: Home / Self Care | Payer: 59 | Attending: Emergency Medicine | Admitting: Emergency Medicine

## 2020-03-05 DIAGNOSIS — Z20822 Contact with and (suspected) exposure to covid-19: Secondary | ICD-10-CM | POA: Insufficient documentation

## 2020-03-05 DIAGNOSIS — J069 Acute upper respiratory infection, unspecified: Secondary | ICD-10-CM | POA: Insufficient documentation

## 2020-03-05 LAB — RESP PANEL BY RT-PCR (FLU A&B, COVID) ARPGX2
Influenza A by PCR: NEGATIVE
Influenza B by PCR: NEGATIVE
SARS Coronavirus 2 by RT PCR: NEGATIVE

## 2020-03-05 NOTE — ED Triage Notes (Signed)
Pt presents with nasal congestion, sore throat, body aches, minor couhg, and back pain xs 5 days.

## 2020-03-05 NOTE — ED Provider Notes (Signed)
[facility]    CSN: [id number] Arrival date & time: 03/05/20  1156      History   Chief Complaint Chief Complaint  Patient presents with  . Nasal Congestion  . Back Pain  . Cough  . Sore Throat    HPI Todd [name] is a 59 y.o. male.   Patient presents with 5-day history of fatigue, body aches, sore throat, runny nose, nasal congestion, nonproductive cough.  He also reports a low-grade fever at the beginning of his symptoms of 100; none since then.  He states he is feeling better today and most of his symptoms have resolved but he continues to feel fatigued and has a runny nose.  OTC treatment attempted at home.  He denies rash, shortness of breath, vomiting, diarrhea, or other symptoms.  His medical history includes TIA, CVA, migraines, palpitations, diverticulosis, anxiety.  The history is provided by the patient and medical records.    Past Medical History:  Diagnosis Date  . Complicated migraine   . Diverticulosis   . External hemorrhoids   . External hemorrhoids   . Headache   . Hx of cardiovascular stress test    Myoview 8/10:  no ischemia, EF 62%; low risk  . Hx of Doppler ultrasound    Carotid US 6/15:  bilat ICA 1-39%  . Hx of echocardiogram    echo 6/15:  EF 60-65%, no RWMA  . Internal hemorrhoids   . Palpitations   . Stroke ([place])   . TIA (transient ischemic attack)   . Tubular adenoma of colon   . Vision abnormalities     Patient Active Problem List   Diagnosis Date Noted  . Shortness of breath 11/12/2016  . Chest pain 11/12/2016  . Heart palpitations 10/20/2013  . Situational anxiety 10/20/2013  . Migraine with visual aura 08/24/2013  . TIA (transient ischemic attack) 08/23/2013  . Blurred vision 08/23/2013  . Anomia 08/23/2013    Past Surgical History:  Procedure Laterality Date  . APPENDECTOMY  12/01/11   lap appy  . LAPAROSCOPIC APPENDECTOMY  12/02/2011   Procedure: APPENDECTOMY LAPAROSCOPIC;  Surgeon: Madilyn Hook, DO;  Location:  [facility];  Service: General;  Laterality: N/A;       Home Medications    Prior to Admission medications   Medication Sig Start Date End Date Taking? Authorizing Provider  doxycycline (VIBRA-TABS) 100 MG tablet Take 1 tablet (100 mg total) by mouth 2 (two) times daily. 02/02/20   Wendie Agreste, MD  hydrocortisone-pramoxine [facility]) 2.5-1 % rectal cream Place 1 application rectally 3 (three) times daily. Patient not taking: Reported on 02/02/2020 10/07/19   Wendie Agreste, MD    Family History Family History  Problem Relation Age of Onset  . Hypertension Mother   . Hypertension Father   . Anemia Neg Hx   . Arrhythmia Neg Hx   . Asthma Neg Hx   . Clotting disorder Neg Hx   . Fainting Neg Hx   . Heart attack Neg Hx   . Heart disease Neg Hx   . Heart failure Neg Hx   . Hyperlipidemia Neg Hx   . Colon cancer Neg Hx   . Esophageal cancer Neg Hx   . Stomach cancer Neg Hx   . Rectal cancer Neg Hx     Social History Social History   Tobacco Use  . Smoking status: Never Smoker  . Smokeless tobacco: Never Used  Vaping Use  . Vaping Use: Never used  Substance  Use Topics  . Alcohol use: No  . Drug use: No     Allergies   Wellbutrin [bupropion hcl]   Review of Systems Review of Systems  Constitutional: Positive for fever. Negative for chills.  HENT: Positive for congestion, rhinorrhea and sore throat. Negative for ear pain.   Eyes: Negative for pain and visual disturbance.  Respiratory: Positive for cough. Negative for shortness of breath.   Cardiovascular: Negative for chest pain and palpitations.  Gastrointestinal: Negative for abdominal pain, diarrhea and vomiting.  Genitourinary: Negative for dysuria and hematuria.  Musculoskeletal: Negative for arthralgias and back pain.  Skin: Negative for color change and rash.  Neurological: Negative for seizures and syncope.  All other systems reviewed and are negative.    Physical Exam Triage Vital Signs ED  Triage Vitals  Enc Vitals Group     BP 03/05/20 1341 126/78     Pulse Rate 03/05/20 1341 63     Resp 03/05/20 1341 17     Temp 03/05/20 1341 97.7 F (36.5 C)     Temp Source 03/05/20 1341 Oral     SpO2 03/05/20 1341 99 %     Weight --      Height --      Head Circumference --      Peak Flow --      Pain Score 03/05/20 1339 4     Pain Loc --      Pain Edu? --      Excl. in [place]? --    No data found.  Updated Vital Signs BP 126/78 (BP Location: Right Arm)   Pulse 63   Temp 97.7 F (36.5 C) (Oral)   Resp 17   SpO2 99%   Visual Acuity Right Eye Distance:   Left Eye Distance:   Bilateral Distance:    Right Eye Near:   Left Eye Near:    Bilateral Near:     Physical Exam Vitals and nursing note reviewed.  Constitutional:      General: He is not in acute distress.    Appearance: He is well-developed and well-nourished.  HENT:     Head: Normocephalic and atraumatic.     Right Ear: Tympanic membrane normal.     Left Ear: Tympanic membrane normal.     Nose: Nose normal.     Mouth/Throat:     Mouth: Mucous membranes are moist.     Pharynx: Oropharynx is clear.  Eyes:     Conjunctiva/sclera: Conjunctivae normal.  Cardiovascular:     Rate and Rhythm: Normal rate and regular rhythm.     Heart sounds: Normal heart sounds.  Pulmonary:     Effort: Pulmonary effort is normal. No respiratory distress.     Breath sounds: Normal breath sounds. No wheezing or rhonchi.  Abdominal:     Palpations: Abdomen is soft.     Tenderness: There is no abdominal tenderness. There is no guarding or rebound.  Musculoskeletal:        General: No edema.     Cervical back: Neck supple.  Skin:    General: Skin is warm and dry.     Findings: No rash.  Neurological:     General: No focal deficit present.     Mental Status: He is alert and oriented to person, place, and time.     Gait: Gait normal.  Psychiatric:        Mood and Affect: Mood and affect and mood normal.  Behavior:  Behavior normal.      UC Treatments / Results  Labs (all labs ordered are listed, but only abnormal results are displayed) Labs Reviewed  RESP PANEL BY RT-PCR (FLU A&B, COVID) [id number]    EKG   Radiology No results found.  Procedures Procedures (including critical care time)  Medications Ordered in UC Medications - No data to display  Initial Impression / Assessment and Plan / UC Course  I have reviewed the triage vital signs and the nursing notes.  Pertinent labs & imaging results that were available during my care of the patient were reviewed by me and considered in my medical decision making (see chart for details).   Viral URI.  Influenza and COVID pending.  Instructed patient to self quarantine until the test results are back.  Discussed symptomatic treatment including Tylenol, rest, hydration.  Instructed patient to follow up with PCP if his symptoms are not improving.  Patient agrees to plan of care.    Final Clinical Impressions(s) / UC Diagnoses   Final diagnoses:  Viral URI     Discharge Instructions     Your COVID and Flu tests are pending.  You should self quarantine until the test results are back.    Take Tylenol or ibuprofen as needed for fever or discomfort.  Rest and keep yourself hydrated.    Follow-up with your primary care provider if your symptoms are not improving.        ED Prescriptions    None     PDMP not reviewed this encounter.   Sharion Balloon, NP 03/05/20 1433

## 2020-03-05 NOTE — Discharge Instructions (Addendum)
Your COVID and Flu tests are pending.  You should self quarantine until the test results are back.    Take Tylenol or ibuprofen as needed for fever or discomfort.  Rest and keep yourself hydrated.    Follow-up with your primary care provider if your symptoms are not improving.     

## 2020-03-22 ENCOUNTER — Encounter (HOSPITAL_COMMUNITY): Payer: Self-pay | Admitting: Emergency Medicine

## 2020-03-22 ENCOUNTER — Emergency Department (HOSPITAL_COMMUNITY): Payer: 59

## 2020-03-22 ENCOUNTER — Emergency Department (HOSPITAL_COMMUNITY)
Admission: EM | Admit: 2020-03-22 | Discharge: 2020-03-22 | Disposition: A | Discharge status: Home / Self Care | Payer: 59 | Attending: Emergency Medicine | Admitting: Emergency Medicine

## 2020-03-22 ENCOUNTER — Other Ambulatory Visit: Payer: Self-pay

## 2020-03-22 DIAGNOSIS — R519 Headache, unspecified: Secondary | ICD-10-CM | POA: Diagnosis not present

## 2020-03-22 DIAGNOSIS — R202 Paresthesia of skin: Secondary | ICD-10-CM | POA: Diagnosis not present

## 2020-03-22 DIAGNOSIS — R410 Disorientation, unspecified: Secondary | ICD-10-CM | POA: Diagnosis not present

## 2020-03-22 DIAGNOSIS — Z8673 Personal history of transient ischemic attack (TIA), and cerebral infarction without residual deficits: Secondary | ICD-10-CM | POA: Diagnosis not present

## 2020-03-22 LAB — CBC
HCT: 47 % (ref 39.0–52.0)
Hemoglobin: 15.8 g/dL (ref 13.0–17.0)
MCH: 30.1 pg (ref 26.0–34.0)
MCHC: 33.6 g/dL (ref 30.0–36.0)
MCV: 89.5 fL (ref 80.0–100.0)
Platelets: 276 10*3/uL (ref 150–400)
RBC: 5.25 MIL/uL (ref 4.22–5.81)
RDW: 12.6 % (ref 11.5–15.5)
WBC: 6.7 10*3/uL (ref 4.0–10.5)
nRBC: 0 % (ref 0.0–0.2)

## 2020-03-22 LAB — PROTIME-INR
INR: 1 (ref 0.8–1.2)
Prothrombin Time: 13 seconds (ref 11.4–15.2)

## 2020-03-22 LAB — COMPREHENSIVE METABOLIC PANEL
ALT: 24 U/L (ref 0–44)
AST: 21 U/L (ref 15–41)
Albumin: 4.1 g/dL (ref 3.5–5.0)
Alkaline Phosphatase: 55 U/L (ref 38–126)
Anion gap: 10 (ref 5–15)
BUN: 16 mg/dL (ref 6–20)
CO2: 25 mmol/L (ref 22–32)
Calcium: 9.1 mg/dL (ref 8.9–10.3)
Chloride: 105 mmol/L (ref 98–111)
Creatinine, Ser: 1.09 mg/dL (ref 0.61–1.24)
GFR, Estimated: >60 mL/min (ref >60)
Glucose, Bld: 106 mg/dL — ABNORMAL HIGH (ref 70–99)
Potassium: 4.2 mmol/L (ref 3.5–5.1)
Sodium: 140 mmol/L (ref 135–145)
Total Bilirubin: 1.5 mg/dL — ABNORMAL HIGH (ref 0.3–1.2)
Total Protein: 7.2 g/dL (ref 6.5–8.1)

## 2020-03-22 LAB — DIFFERENTIAL
Abs Immature Granulocytes: 0.02 10*3/uL (ref 0.00–0.07)
Basophils Absolute: 0.1 10*3/uL (ref 0.0–0.1)
Basophils Relative: 1 %
Eosinophils Absolute: 0.2 10*3/uL (ref 0.0–0.5)
Eosinophils Relative: 2 %
Immature Granulocytes: 0 %
Lymphocytes Relative: 27 %
Lymphs Abs: 1.8 10*3/uL (ref 0.7–4.0)
Monocytes Absolute: 0.5 10*3/uL (ref 0.1–1.0)
Monocytes Relative: 7 %
Neutro Abs: 4.2 10*3/uL (ref 1.7–7.7)
Neutrophils Relative %: 63 %

## 2020-03-22 LAB — I-STAT CHEM 8, ED
BUN: 16 mg/dL (ref 6–20)
Calcium, Ion: 1.2 mmol/L (ref 1.15–1.40)
Chloride: 104 mmol/L (ref 98–111)
Creatinine, Ser: 1 mg/dL (ref 0.61–1.24)
Glucose, Bld: 100 mg/dL — ABNORMAL HIGH (ref 70–99)
HCT: 46 % (ref 39.0–52.0)
Hemoglobin: 15.6 g/dL (ref 13.0–17.0)
Potassium: 4.2 mmol/L (ref 3.5–5.1)
Sodium: 141 mmol/L (ref 135–145)
TCO2: 26 mmol/L (ref 22–32)

## 2020-03-22 LAB — CBG MONITORING, ED: Glucose-Capillary: 96 mg/dL (ref 70–99)

## 2020-03-22 LAB — APTT: aPTT: 32 seconds (ref 24–36)

## 2020-03-22 MED ORDER — ACETAMINOPHEN 325 MG PO TABS
650.0000 mg | ORAL_TABLET | Freq: Once | ORAL | Status: AC
  Administered 2020-03-22: 650 mg via ORAL
  Filled 2020-03-22: qty 2

## 2020-03-22 MED ORDER — SODIUM CHLORIDE 0.9 % IV SOLN
12.5000 mg | INTRAVENOUS | Status: AC
  Administered 2020-03-22: 12.5 mg via INTRAVENOUS
  Filled 2020-03-22: qty 0.25

## 2020-03-22 MED ORDER — METOCLOPRAMIDE HCL 5 MG/ML IJ SOLN
10.0000 mg | Freq: Once | INTRAMUSCULAR | Status: AC
  Administered 2020-03-22: 10 mg via INTRAVENOUS
  Filled 2020-03-22: qty 2

## 2020-03-22 MED ORDER — SODIUM CHLORIDE 0.9% FLUSH
3.0000 mL | Freq: Once | INTRAVENOUS | Status: AC
  Administered 2020-03-22: 3 mL via INTRAVENOUS

## 2020-03-22 NOTE — ED Triage Notes (Signed)
Pt grips equal but decreased sensation on L arm. Pfifer MD notified.

## 2020-03-22 NOTE — ED Provider Notes (Signed)
[facility] DEPT Provider Note   CSN: [id number] Arrival date & time: 03/22/20  1134     History Chief Complaint  Patient presents with  . Numbness  . Altered Mental Status    Todd [name] is a 59 y.o. male.  HPI He states that today approximately 30 minutes prior to arrival he noticed some spots in his vision he states that this is similar abscesses in the past.  He states that the white spots were present for approximately 30 minutes he states that he then noticed that when he looked at his phone he did not recognize his friends names and his contact list.  He states he felt slow to make these connections with his memory.  He states discussed and feel very concerning came to the ER for further evaluation.  He states he originally had no headache.  He was seen initially by Dr. Vallery Ridge in the triage area and no dose work was initiated.  Stroke lab work and MRI were ordered however patient tells me at this time that he is unwilling to have an MRI due to severe claustrophobia.  He understands that this is a recommendation and that he is not recommended to defer MRI at this time however he states that he understands the risks and continues to decline MRI.  Patient states that he did have some subjective numbness/sensation changes in his arm prior to arrival however this is completely gone at this time. No other associate symptoms.  No aggravating factors.  He is unable to determine any triggers to these symptoms in the preceding headaches.  He denies any weakness lightheadedness or dizziness.  Denies any vision problems at this time.    Past Medical History:  Diagnosis Date  . Complicated migraine   . Diverticulosis   . External hemorrhoids   . External hemorrhoids   . Headache   . Hx of cardiovascular stress test    Myoview 8/10:  no ischemia, EF 62%; low risk  . Hx of Doppler ultrasound    Carotid US 6/15:  bilat ICA 1-39%  . Hx of echocardiogram     echo 6/15:  EF 60-65%, no RWMA  . Internal hemorrhoids   . Palpitations   . Stroke ([place])   . TIA (transient ischemic attack)   . Tubular adenoma of colon   . Vision abnormalities     Patient Active Problem List   Diagnosis Date Noted  . Shortness of breath 11/12/2016  . Chest pain 11/12/2016  . Heart palpitations 10/20/2013  . Situational anxiety 10/20/2013  . Migraine with visual aura 08/24/2013  . TIA (transient ischemic attack) 08/23/2013  . Blurred vision 08/23/2013  . Anomia 08/23/2013    Past Surgical History:  Procedure Laterality Date  . APPENDECTOMY  12/01/11   lap appy  . LAPAROSCOPIC APPENDECTOMY  12/02/2011   Procedure: APPENDECTOMY LAPAROSCOPIC;  Surgeon: Madilyn Hook, DO;  Location: [facility];  Service: General;  Laterality: N/A;       Family History  Problem Relation Age of Onset  . Hypertension Mother   . Hypertension Father   . Anemia Neg Hx   . Arrhythmia Neg Hx   . Asthma Neg Hx   . Clotting disorder Neg Hx   . Fainting Neg Hx   . Heart attack Neg Hx   . Heart disease Neg Hx   . Heart failure Neg Hx   . Hyperlipidemia Neg Hx   . Colon cancer Neg Hx   . Esophageal  cancer Neg Hx   . Stomach cancer Neg Hx   . Rectal cancer Neg Hx     Social History   Tobacco Use  . Smoking status: Never Smoker  . Smokeless tobacco: Never Used  Vaping Use  . Vaping Use: Never used  Substance Use Topics  . Alcohol use: No  . Drug use: No    Home Medications Prior to Admission medications   Not on File    Allergies    Wellbutrin [bupropion hcl]  Review of Systems   Review of Systems  Constitutional: Positive for fatigue. Negative for chills and fever.  HENT: Negative for congestion.   Eyes: Negative for pain.  Respiratory: Negative for cough and shortness of breath.   Cardiovascular: Negative for chest pain and leg swelling.  Gastrointestinal: Negative for abdominal pain and vomiting.  Genitourinary: Negative for dysuria.  Musculoskeletal: Negative  for myalgias.  Skin: Negative for rash.  Neurological: Positive for numbness (Now resolved) and headaches. Negative for dizziness.       Memory issue    Physical Exam Updated Vital Signs BP 112/77   Pulse 65   Temp 97.6 F (36.4 C) (Oral)   Resp 20   SpO2 99%   Physical Exam Vitals and nursing note reviewed.  Constitutional:      General: He is not in acute distress.    Comments: Pleasant well-appearing 59 year old.  In no acute distress.  Sitting comfortably in bed.  Able answer questions appropriately follow commands. No increased work of breathing. Speaking in full sentences.  HENT:     Head: Normocephalic and atraumatic.     Nose: Nose normal.  Eyes:     General: No scleral icterus. Cardiovascular:     Rate and Rhythm: Normal rate and regular rhythm.     Pulses: Normal pulses.     Heart sounds: Normal heart sounds.  Pulmonary:     Effort: Pulmonary effort is normal. No respiratory distress.     Breath sounds: No wheezing.  Abdominal:     Palpations: Abdomen is soft.     Tenderness: There is no abdominal tenderness.  Musculoskeletal:     Cervical back: Normal range of motion.     Right lower leg: No edema.     Left lower leg: No edema.  Skin:    General: Skin is warm and dry.     Capillary Refill: Capillary refill takes less than 2 seconds.  Neurological:     Mental Status: He is alert. Mental status is at baseline.     Comments: Alert and oriented to self, place, time and event.   Speech is fluent, clear without dysarthria or dysphasia.   Strength 5/5 in upper/lower extremities  Sensation intact in upper/lower extremities   Normal gait.  Negative Romberg. No pronator drift.  Normal finger-to-nose and feet tapping.  CN I not tested  CN II grossly intact visual fields bilaterally. Did not visualize posterior eye.   CN III, IV, VI PERRLA and EOMs intact bilaterally  CN V Intact sensation to sharp and light touch to the face  CN VII facial movements  symmetric  CN VIII not tested  CN IX, X no uvula deviation, symmetric rise of soft palate  CN XI 5/5 SCM and trapezius strength bilaterally  CN XII Midline tongue protrusion, symmetric L/R movements   Psychiatric:        Mood and Affect: Mood normal.        Behavior: Behavior normal.    ED Results /  Procedures / Treatments   Labs (all labs ordered are listed, but only abnormal results are displayed) Labs Reviewed  COMPREHENSIVE METABOLIC PANEL - Abnormal; Notable for the following components:      Result Value   Glucose, Bld 106 (*)    Total Bilirubin 1.5 (*)    All other components within normal limits  I-STAT CHEM 8, ED - Abnormal; Notable for the following components:   Glucose, Bld 100 (*)    All other components within normal limits  PROTIME-INR  APTT  CBC  DIFFERENTIAL  CBG MONITORING, ED    EKG None  Radiology No results found.  Procedures Procedures (including critical care time)  Medications Ordered in ED Medications  sodium chloride flush (NS) 0.9 % injection 3 mL (has no administration in time range)  metoCLOPramide (REGLAN) injection 10 mg (10 mg Intravenous Given 03/22/20 1403)  diphenhydrAMINE (BENADRYL) 12.5 mg in sodium chloride 0.9 % 50 mL IVPB (12.5 mg Intravenous New Bag/Given 03/22/20 1406)  acetaminophen (TYLENOL) tablet 650 mg (650 mg Oral Given 03/22/20 1402)    ED Course  I have reviewed the triage vital signs and the nursing notes.  Pertinent labs & imaging results that were available during my care of the patient were reviewed by me and considered in my medical decision making (see chart for details).    MDM Rules/Calculators/A&P                          Patient is 59 year old male with a 6-year history of approximately twice a year episodes of episodes of confusion followed by headache.  He states that he has been seen in the ER at different cities for similar symptoms.  He states that he was once told that he had had a TIA.  During  that hospitalization he did however have a normal MRI.  He is not followed by a neurologist has never seen him before.  He states that today approximately 30 minutes prior to arrival he noticed some spots in his vision he states that this is similar abscesses in the past.  He states that the white spots were present for approximately 30 minutes he states that he then noticed that when he looked at his phone he did not recognize his friends names and his contact list.  He states he felt slow to make these connections with his memory.  He states discussed and feel very concerning came to the ER for further evaluation.  He states he originally had no headache.  He was seen initially by Dr. Vallery Ridge in the triage area and no dose work was initiated.  Stroke lab work and MRI were ordered however patient tells me at this time that he is unwilling to have an MRI due to severe claustrophobia.  He understands that this is a recommendation and that he is not recommended to defer MRI at this time however he states that he understands the risks and continues to decline MRI.  We will obtain CT imaging of the head. PE is notable for completely neurologically normal exam.  He is mentating well.  He denies any symptoms at all presently except for a headache which he states is normal during his episodes that come on after the visual symptoms.  I have very low suspicion for intracranial however CT to evaluate for this.  More likely this is a complex migraine.  We will provide patient a migraine cocktail.  Patient is vital signs are within  normal notes.  CMP without abnormality, i-STAT ECG same.  CBC for leukocytosis or anemia.  Coags within normal limits.  CT head pending at time of sign off.  Oncoming provider will follow up on CT head.  I anticipate patient will be a reasonable discharge with neurology follow-up. Patient is understanding of my plan at this time.  Has no further questions.  CT departments called and  states that he is next in line for CT imaging.  Final Clinical Impression(s) / ED Diagnoses Final diagnoses:  Confusion  Nonintractable headache, unspecified chronicity pattern, unspecified headache type    Rx / DC Orders ED Discharge Orders    None       Tedd Sias, [place] 03/22/20 1530    Truddie Hidden, MD 03/23/20 1302

## 2020-03-22 NOTE — Discharge Instructions (Addendum)
Please call the neurology office that I provided information for. Please read the attached information on migraines.  Please drink plenty of water.  You may use Tylenol and ibuprofen in alternating fashion for headaches. You may always return to the ER for any new or concerning symptoms.

## 2020-03-22 NOTE — ED Provider Notes (Signed)
MSE was initiated and I personally evaluated the patient and placed orders (if any) at  12:02 PM on March 22, 2020.  The patient appears stable so that the remainder of the MSE may be completed by another provider.  35 minutes prior to arrival patient developed spots in his vision.  He reports is like when you look at the sun and looked away.  There were bright white spots.  He then was driving in proceed that he could not remember friends names.  He would look at the name on the phone and could not quite recall who it was.  He also then noted that he felt some numbness on the left arm.  No associated headache.  He came to the emergency department for evaluation.  He reports that the recall is now normal and he is not having difficulty recalling names.  The spots in his vision resolved.  He reports he still feels a slight feeling of tingling or numbness in the left arm.  Patient is alert and appropriate with clear mental status.  Speech is clear.  Cognitive function is normal.  Motor strength 5\5.  Slight subjective difference to light touch left upper extremity to right.  At this time, we will proceed with stoke work-up but not code stroke currently as symptoms are nearly resolved and there are no objective findings.  Patient does have history of complex migraine.  With light flashes have some suspicion for migraine.  Will monitor closely and proceed with work-up including MRI brain.  EMR indicates patient had a normal brain MRI in 2017.   Arby Barrette, MD 03/22/20 [phone]

## 2020-03-22 NOTE — ED Notes (Signed)
Code stroke cancelled by Dr. Trude Mcburney. Will do MRI, not CT.

## 2020-03-22 NOTE — ED Notes (Signed)
Fall Bundle: Bed alarm on Yellow socks Yellow arm band Fall risk sign outside door

## 2020-03-22 NOTE — ED Notes (Signed)
MRI arrived to request pt fill out paperwork. Pt states he does not want the MRI due to "can't breath in the machine" [facility] PA aware and will speak with pt, he is aware to cancel order if pt continues to refuse test.

## 2020-03-22 NOTE — ED Triage Notes (Signed)
Pt states that approx 30 mins ago he started having L sided arm numbness and is having trrouble remembering his friends names. Also reports 'seeing spots' and some chest pain. States he has a hx of a 'mini stroke' about 5 years ago. Answering questions appropriately but slow to answer.

## 2020-03-22 NOTE — ED Provider Notes (Signed)
Patient was received at shift change from [facility], he provided HPI, current work-up, likely disposition please see his note for full detail  In short patient with significant medical history of migraines, TIA presents to the emergency department with chief complaint of brief visual changes and some confusion all which has resolved.  Patient endorses that he felt like he saw some white spots bilateral in his eyes and then had difficulty understanding some of his texts earlier today.  He states this has all  resolved but currently has a headache at this time.  He denies recent head trauma, is not on anticoagulant, denies change in vision, paresthesias or weakness in the upper or lower extremities.  Patient endorses that he had a TIA versus migraine the past, work-up was unremarkable.  He states he typically does not have migraines.  He denies headaches, fevers, chills, shortness of breath, chest pain, abdominal pain, nausea, vomiting, diarrhea, pedal edema.  Current work-up reveals CBC negative for leukocytosis or signs of anemia CMP with no electrolyte abnormalities, no metabolic acidosis, hyperglycemia of 106, no AKI, no elevated liver enzymes, no anion gap present.  Prothrombin time 13, INR 1, APTT 33, EKG sinus rhythm without signs of ischemia no ST elevation depression noted.  Per previous provider will follow up on head CT, if negative and patient is feeling well, vital signs reassuring will discharge home with follow-up at neurology.  Physical Exam  BP 103/71   Pulse (!) 59   Temp 97.6 F (36.4 C) (Oral)   Resp 17   SpO2 96%   Physical Exam Vitals and nursing note reviewed.  Constitutional:      General: He is not in acute distress.    Appearance: Normal appearance. He is not ill-appearing or diaphoretic.  HENT:     Head: Normocephalic and atraumatic.     Nose: No congestion or rhinorrhea.     Mouth/Throat:     Mouth: Mucous membranes are moist.     Pharynx: Oropharynx is  clear.  Eyes:     General: No visual field deficit or scleral icterus.    Extraocular Movements: Extraocular movements intact.     Conjunctiva/sclera: Conjunctivae normal.     Pupils: Pupils are equal, round, and reactive to light.  Cardiovascular:     Rate and Rhythm: Normal rate and regular rhythm.     Pulses: Normal pulses.     Heart sounds: No murmur heard. No friction rub. No gallop.   Pulmonary:     Effort: Pulmonary effort is normal. No respiratory distress.     Breath sounds: No wheezing, rhonchi or rales.  Abdominal:     Palpations: Abdomen is soft.     Tenderness: There is no abdominal tenderness.  Musculoskeletal:        General: No swelling or tenderness.     Right lower leg: No edema.     Left lower leg: No edema.     Comments: Patient is moving all 4 extremities at difficulty.  Skin:    General: Skin is warm and dry.  Neurological:     General: No focal deficit present.     Mental Status: He is alert.     GCS: GCS eye subscore is 4. GCS verbal subscore is 5. GCS motor subscore is 6.     Cranial Nerves: Cranial nerves are intact. No cranial nerve deficit or facial asymmetry.     Sensory: Sensation is intact. No sensory deficit.     Motor: Motor function  is intact. No weakness or pronator drift.     Coordination: Coordination is intact. Romberg sign negative. Finger-Nose-Finger Test normal.  Psychiatric:        Mood and Affect: Mood normal.     ED Course/Procedures     Procedures  MDM   Patient's head CT is unremarkable, no acute findings.  low suspicion for CVA or intracranial head bleed as patient denies change in vision, paresthesias or weakness to upper lower extremities, no neuro deficits noted on exam, CT head did not reveal any acute findings.  Low suspicion for ACS or arrhythmias as patient denies chest pain, shortness of breath, no hypoperfusion or fluid overload on exam, EKG sinus without signs of ischemia.  Low suspicion for systemic infection as  patient is nontoxic-appearing, vital signs reassuring, no obvious source infection noted on exam.  I suspect patient may suffer from a migraine causing him his symptoms.  He states he feels much better after having a migraine cocktail has no complaints at this time.  Will recommend over-the-counter pain medication and have follow-up with neurology for further evaluation.  Vital signs have remained stable, no indication for hospital admission.  Patient given at home care as well strict return precautions.  Patient verbalized that they understood agreed to said plan.        Carroll Sage, PA-C 03/22/20 1627    Geoffery Lyons, MD 03/23/20 [phone]

## 2020-03-29 ENCOUNTER — Ambulatory Visit: Payer: 59 | Admitting: Family Medicine

## 2020-03-29 ENCOUNTER — Other Ambulatory Visit: Payer: Self-pay

## 2020-03-31 ENCOUNTER — Emergency Department (HOSPITAL_COMMUNITY): Payer: 59

## 2020-03-31 ENCOUNTER — Encounter (HOSPITAL_COMMUNITY): Payer: Self-pay

## 2020-03-31 ENCOUNTER — Ambulatory Visit (HOSPITAL_COMMUNITY)
Admission: EM | Admit: 2020-03-31 | Discharge: 2020-03-31 | Disposition: A | Discharge status: Other Acute Inpatient Hospital | Payer: 59

## 2020-03-31 ENCOUNTER — Other Ambulatory Visit: Payer: Self-pay

## 2020-03-31 ENCOUNTER — Encounter (HOSPITAL_COMMUNITY): Payer: Self-pay | Admitting: *Deleted

## 2020-03-31 ENCOUNTER — Emergency Department (HOSPITAL_COMMUNITY)
Admission: EM | Admit: 2020-03-31 | Discharge: 2020-03-31 | Disposition: A | Discharge status: Home / Self Care | Payer: 59 | Attending: Emergency Medicine | Admitting: Emergency Medicine

## 2020-03-31 DIAGNOSIS — M79602 Pain in left arm: Secondary | ICD-10-CM | POA: Insufficient documentation

## 2020-03-31 DIAGNOSIS — R251 Tremor, unspecified: Secondary | ICD-10-CM | POA: Diagnosis not present

## 2020-03-31 DIAGNOSIS — Z5321 Procedure and treatment not carried out due to patient leaving prior to being seen by health care provider: Secondary | ICD-10-CM | POA: Insufficient documentation

## 2020-03-31 DIAGNOSIS — H538 Other visual disturbances: Secondary | ICD-10-CM | POA: Diagnosis not present

## 2020-03-31 DIAGNOSIS — R519 Headache, unspecified: Secondary | ICD-10-CM | POA: Diagnosis present

## 2020-03-31 DIAGNOSIS — H539 Unspecified visual disturbance: Secondary | ICD-10-CM

## 2020-03-31 LAB — BASIC METABOLIC PANEL
Anion gap: 12 (ref 5–15)
BUN: 14 mg/dL (ref 6–20)
CO2: 22 mmol/L (ref 22–32)
Calcium: 9 mg/dL (ref 8.9–10.3)
Chloride: 103 mmol/L (ref 98–111)
Creatinine, Ser: 1.1 mg/dL (ref 0.61–1.24)
GFR, Estimated: >60 mL/min (ref >60)
Glucose, Bld: 132 mg/dL — ABNORMAL HIGH (ref 70–99)
Potassium: 4.2 mmol/L (ref 3.5–5.1)
Sodium: 137 mmol/L (ref 135–145)

## 2020-03-31 LAB — CBC WITH DIFFERENTIAL/PLATELET
Abs Immature Granulocytes: 0.07 10*3/uL (ref 0.00–0.07)
Basophils Absolute: 0.1 10*3/uL (ref 0.0–0.1)
Basophils Relative: 1 %
Eosinophils Absolute: 0.1 10*3/uL (ref 0.0–0.5)
Eosinophils Relative: 1 %
HCT: 46 % (ref 39.0–52.0)
Hemoglobin: 15.8 g/dL (ref 13.0–17.0)
Immature Granulocytes: 1 %
Lymphocytes Relative: 20 %
Lymphs Abs: 1.7 10*3/uL (ref 0.7–4.0)
MCH: 30.1 pg (ref 26.0–34.0)
MCHC: 34.3 g/dL (ref 30.0–36.0)
MCV: 87.6 fL (ref 80.0–100.0)
Monocytes Absolute: 0.5 10*3/uL (ref 0.1–1.0)
Monocytes Relative: 6 %
Neutro Abs: 6 10*3/uL (ref 1.7–7.7)
Neutrophils Relative %: 71 %
Platelets: 273 10*3/uL (ref 150–400)
RBC: 5.25 MIL/uL (ref 4.22–5.81)
RDW: 12.5 % (ref 11.5–15.5)
WBC: 8.5 10*3/uL (ref 4.0–10.5)
nRBC: 0 % (ref 0.0–0.2)

## 2020-03-31 MED ORDER — SODIUM CHLORIDE 0.9% FLUSH
3.0000 mL | Freq: Once | INTRAVENOUS | Status: DC
Start: 2020-03-31 — End: 2020-04-01

## 2020-03-31 NOTE — ED Provider Notes (Signed)
[place]    CSN: [id number] Arrival date & time: 03/31/20  1608      History   Chief Complaint Chief Complaint  Patient presents with  . Weakness    HPI Todd [name] is a 60 y.o. male.   Pt complains of ands feeling weak and trembling.  Pt reports seeing spots and light bothers his eyes. Pt complains of numbness in left arm.  Pt reports he has had vision issues in the past and they have continued since ED visit.  He was seen in ED a week ago and had a ct scan.  Pt reports he has had a stroke in the past. (Pt record reviewed, he declined MRI)   The history is provided by the patient. No language interpreter was used.    Past Medical History:  Diagnosis Date  . Complicated migraine   . Diverticulosis   . External hemorrhoids   . External hemorrhoids   . Headache   . Hx of cardiovascular stress test    Myoview 8/10:  no ischemia, EF 62%; low risk  . Hx of Doppler ultrasound    Carotid US 6/15:  bilat ICA 1-39%  . Hx of echocardiogram    echo 6/15:  EF 60-65%, no RWMA  . Internal hemorrhoids   . Palpitations   . Stroke ([place])   . TIA (transient ischemic attack)   . Tubular adenoma of colon   . Vision abnormalities     Patient Active Problem List   Diagnosis Date Noted  . Shortness of breath 11/12/2016  . Chest pain 11/12/2016  . Heart palpitations 10/20/2013  . Situational anxiety 10/20/2013  . Migraine with visual aura 08/24/2013  . TIA (transient ischemic attack) 08/23/2013  . Blurred vision 08/23/2013  . Anomia 08/23/2013    Past Surgical History:  Procedure Laterality Date  . APPENDECTOMY  12/01/11   lap appy  . LAPAROSCOPIC APPENDECTOMY  12/02/2011   Procedure: APPENDECTOMY LAPAROSCOPIC;  Surgeon: Madilyn Hook, DO;  Location: [facility];  Service: General;  Laterality: N/A;       Home Medications    Prior to Admission medications   Not on File    Family History Family History  Problem Relation Age of Onset  . Hypertension Mother   .  Hypertension Father   . Anemia Neg Hx   . Arrhythmia Neg Hx   . Asthma Neg Hx   . Clotting disorder Neg Hx   . Fainting Neg Hx   . Heart attack Neg Hx   . Heart disease Neg Hx   . Heart failure Neg Hx   . Hyperlipidemia Neg Hx   . Colon cancer Neg Hx   . Esophageal cancer Neg Hx   . Stomach cancer Neg Hx   . Rectal cancer Neg Hx     Social History Social History   Tobacco Use  . Smoking status: Never Smoker  . Smokeless tobacco: Never Used  Vaping Use  . Vaping Use: Never used  Substance Use Topics  . Alcohol use: No  . Drug use: No     Allergies   Wellbutrin [bupropion hcl]   Review of Systems Review of Systems  All other systems reviewed and are negative.    Physical Exam Triage Vital Signs ED Triage Vitals [03/31/20 1614]  Enc Vitals Group     BP 133/89     Pulse      Resp 18     Temp (!) 97.3 F (36.3 C)  Temp Source Oral     SpO2 99 %     Weight      Height      Head Circumference      Peak Flow      Pain Score      Pain Loc      Pain Edu?      Excl. in [place]?    No data found.  Updated Vital Signs BP 133/89 (BP Location: Right Arm)   Temp (!) 97.3 F (36.3 C) (Oral)   Resp 18   SpO2 99%   Visual Acuity Right Eye Distance:   Left Eye Distance:   Bilateral Distance:    Right Eye Near:   Left Eye Near:    Bilateral Near:     Physical Exam Vitals reviewed.  Cardiovascular:     Rate and Rhythm: Normal rate.  Pulmonary:     Effort: Pulmonary effort is normal.  Skin:    General: Skin is warm.  Neurological:     General: No focal deficit present.     Mental Status: He is alert.  Psychiatric:        Mood and Affect: Mood normal.      UC Treatments / Results  Labs (all labs ordered are listed, but only abnormal results are displayed) Labs Reviewed - No data to display  EKG   Radiology No results found.  Procedures Procedures (including critical care time)  Medications Ordered in UC Medications - No data to  display  Initial Impression / Assessment and Plan / UC Course  I have reviewed the triage vital signs and the nursing notes.  Pertinent labs & imaging results that were available during my care of the patient were reviewed by me and considered in my medical decision making (see chart for details).     MDM:  Pt reports weakness in left arm started today. Pt advised to go to ED for further evaluation.   Final Clinical Impressions(s) / UC Diagnoses   Final diagnoses:  Visual disturbance   Discharge Instructions   None    ED Prescriptions    None     PDMP not reviewed this encounter.   Fransico Meadow, [place] 03/31/20 1642

## 2020-03-31 NOTE — ED Triage Notes (Signed)
Pt reports he went to ED one weak ago pt went to Ed for simular Sx. Pt now repor lights look bright and his Lt arm feels heavy  And both hands feel shaky.

## 2020-03-31 NOTE — ED Notes (Signed)
Pt informed tech he is leaving AMA. Tech moving pt to OTF.

## 2020-03-31 NOTE — ED Triage Notes (Signed)
Lydia Guiles NP at bedside to assess PT . Plan to take Pt to ED for more work up.

## 2020-03-31 NOTE — ED Triage Notes (Signed)
Pt presents POV with headache, light sensitivity, blurry vision, tremors and Left arm heaviness x1 week. Pt seen here 1 week ago for the same, worked up as a stroke, dc'd home with a migraine headache.

## 2020-04-02 ENCOUNTER — Other Ambulatory Visit: Payer: Self-pay

## 2020-04-02 ENCOUNTER — Encounter: Payer: Self-pay | Admitting: Family Medicine

## 2020-04-02 ENCOUNTER — Telehealth (INDEPENDENT_AMBULATORY_CARE_PROVIDER_SITE_OTHER): Payer: 59 | Admitting: Family Medicine

## 2020-04-02 VITALS — Ht 67.0 in | Wt 156.0 lb

## 2020-04-02 DIAGNOSIS — G43109 Migraine with aura, not intractable, without status migrainosus: Secondary | ICD-10-CM

## 2020-04-02 DIAGNOSIS — H539 Unspecified visual disturbance: Secondary | ICD-10-CM

## 2020-04-02 MED ORDER — RIZATRIPTAN BENZOATE 5 MG PO TABS: 5.0000 mg | ORAL_TABLET | ORAL | 0 refills | Status: DC | PRN

## 2020-04-02 NOTE — Progress Notes (Signed)
Virtual Visit Note  I connected with patient on 04/02/20 at 1622 by telephone due to unable to work Epic video visit and verified that I am speaking with the correct person using two identifiers. Todd [name] is currently located at home and no family members are currently with them during visit. The provider, Laurita Quint Mory Herrman, FNP is located in their office at time of visit.  I discussed the limitations, risks, security and privacy concerns of performing an evaluation and management service by telephone and the availability of in person appointments. I also discussed with the patient that there may be a patient responsible charge related to this service. The patient expressed understanding and agreed to proceed.   I provided 20 minutes of non-face-to-face time during this encounter.  Chief Complaint  Patient presents with  . Headache    Started 10 days ago started to see white spots.  Was forgetting thing and names in the parking lot.  Eyes has been Sensitive to light. (off and on since 10 days ago) Left arm feels heavy. Lasted a couple of hours. Was seen at the ER for this twice. The waves comes and goes and makes his hand shaking. This has happened today as well.      HPI ? Went to the ED twice Started seeing white spots 10 days ago Had numbness to his left arm  12/30 and 1/8 ED visits both with no acute findings Appointment with neurology In March but he is worried this is too far out  Patient's head CT is unremarkable, no acute findings. low suspicion for CVA or intracranial head bleed as patient denies change in vision, paresthesias or weakness to upper lower extremities, no neuro deficits noted on exam, CT head did not reveal any acute findings.  Low suspicion for ACS or arrhythmias as patient denies chest pain, shortness of breath, no hypoperfusion or fluid overload on exam, EKG sinus without signs of ischemia.  Low suspicion for systemic infection as patient is  nontoxic-appearing, vital signs reassuring, no obvious source infection noted on exam.  I suspect patient may suffer from a migraine causing him his symptoms.  He states he feels much better after having a migraine cocktail has no complaints at this time.  Will recommend over-the-counter pain medication and have follow-up with neurology for further evaluation.  BP Readings from Last 3 Encounters:  03/31/20 123/78  03/31/20 133/89  03/22/20 103/71   Lab Results  Component Value Date   CHOL 245 (H) 09/19/2019   HDL 47 09/19/2019   LDLCALC 161 (H) 09/19/2019   TRIG 199 (H) 09/19/2019   CHOLHDL 5.2 (H) 09/19/2019     Allergies  Allergen Reactions  . Wellbutrin [Bupropion Hcl] Itching and Rash    Prior to Admission medications   Not on File    Past Medical History:  Diagnosis Date  . Complicated migraine   . Diverticulosis   . External hemorrhoids   . External hemorrhoids   . Headache   . Hx of cardiovascular stress test    Myoview 8/10:  no ischemia, EF 62%; low risk  . Hx of Doppler ultrasound    Carotid US 6/15:  bilat ICA 1-39%  . Hx of echocardiogram    echo 6/15:  EF 60-65%, no RWMA  . Internal hemorrhoids   . Palpitations   . Stroke ([place])   . TIA (transient ischemic attack)   . Tubular adenoma of colon   . Vision abnormalities     Past Surgical  History:  Procedure Laterality Date  . APPENDECTOMY  12/01/11   lap appy  . LAPAROSCOPIC APPENDECTOMY  12/02/2011   Procedure: APPENDECTOMY LAPAROSCOPIC;  Surgeon: Madilyn Hook, DO;  Location: [facility];  Service: General;  Laterality: N/A;    Social History   Tobacco Use  . Smoking status: Never Smoker  . Smokeless tobacco: Never Used  Substance Use Topics  . Alcohol use: No    Family History  Problem Relation Age of Onset  . Hypertension Mother   . Hypertension Father   . Anemia Neg Hx   . Arrhythmia Neg Hx   . Asthma Neg Hx   . Clotting disorder Neg Hx   . Fainting Neg Hx   . Heart attack Neg Hx   .  Heart disease Neg Hx   . Heart failure Neg Hx   . Hyperlipidemia Neg Hx   . Colon cancer Neg Hx   . Esophageal cancer Neg Hx   . Stomach cancer Neg Hx   . Rectal cancer Neg Hx     Review of Systems  Constitutional: Negative for chills, fever and malaise/fatigue.  HENT: Positive for tinnitus. Negative for hearing loss.   Eyes: Positive for photophobia. Negative for blurred vision and double vision.  Respiratory: Negative for cough, shortness of breath and wheezing.   Cardiovascular: Negative for chest pain, palpitations and leg swelling.  Gastrointestinal: Negative for abdominal pain, blood in stool, constipation, diarrhea, heartburn, nausea and vomiting.  Genitourinary: Negative for dysuria, frequency and hematuria.  Musculoskeletal: Negative for back pain, joint pain and myalgias.  Skin: Negative for rash.  Neurological: Positive for headaches. Negative for dizziness and weakness.  Psychiatric/Behavioral: Positive for memory loss.    Objective  Constitutional:      General: Not in acute distress.    Appearance: Normal appearance. Not ill-appearing.   Pulmonary:     Effort: Pulmonary effort is normal. No respiratory distress.  Neurological:     Mental Status: Alert and oriented to person, place, and time.  Psychiatric:        Mood and Affect: Mood normal.        Behavior: Behavior normal.     ASSESSMENT and PLAN  Problem List Items Addressed This Visit      Cardiovascular and Mediastinum   Migraine with visual aura   Relevant Medications   rizatriptan (MAXALT) 5 MG tablet    Other Visit Diagnoses    Vision changes    -  Primary   Relevant Medications   rizatriptan (MAXALT) 5 MG tablet   Other Relevant Orders   Ambulatory referral to Neurology   Ambulatory referral to Ophthalmology   MR Brain W Wo Contrast     RTC/ ED precautions provided R/se/b of medications discussed Referrals sent  Return if symptoms worsen or fail to improve.    The above assessment  and management plan was discussed with the patient. The patient verbalized understanding of and has agreed to the management plan. Patient is aware to call the clinic if symptoms persist or worsen. Patient is aware when to return to the clinic for a follow-up visit. Patient educated on when it is appropriate to go to the emergency department.     Todd Foley Maryssa Giampietro, FNP-BC [facility] at [facility], [place] [address] Ph.  [phone] Fax [phone]

## 2020-04-02 NOTE — Patient Instructions (Addendum)
 Migraine Headache A migraine headache is a very strong throbbing pain on one side or both sides of your head. This type of headache can also cause other symptoms. It can last from 4 hours to 3 days. Talk with your doctor about what things may bring on (trigger) this condition. What are the causes? The exact cause of this condition is not known. This condition may be triggered or caused by:  Drinking alcohol.  Smoking.  Taking medicines, such as: ? Medicine used to treat chest pain (nitroglycerin). ? Birth control pills. ? Estrogen. ? Some blood pressure medicines.  Eating or drinking certain products.  Doing physical activity. Other things that may trigger a migraine headache include:  Having a menstrual period.  Pregnancy.  Hunger.  Stress.  Not getting enough sleep or getting too much sleep.  Weather changes.  Tiredness (fatigue). What increases the risk?  Being 25-55 years old.  Being male.  Having a family history of migraine headaches.  Being Caucasian.  Having depression or anxiety.  Being very overweight. What are the signs or symptoms?  A throbbing pain. This pain may: ? Happen in any area of the head, such as on one side or both sides. ? Make it hard to do daily activities. ? Get worse with physical activity. ? Get worse around bright lights or loud noises.  Other symptoms may include: ? Feeling sick to your stomach (nauseous). ? Vomiting. ? Dizziness. ? Being sensitive to bright lights, loud noises, or smells.  Before you get a migraine headache, you may get warning signs (an aura). An aura may include: ? Seeing flashing lights or having blind spots. ? Seeing bright spots, halos, or zigzag lines. ? Having tunnel vision or blurred vision. ? Having numbness or a tingling feeling. ? Having trouble talking. ? Having weak muscles.  Some people have symptoms after a migraine headache (postdromal phase), such as: ? Tiredness. ? Trouble  thinking (concentrating). How is this treated?  Taking medicines that: ? Relieve pain. ? Relieve the feeling of being sick to your stomach. ? Prevent migraine headaches.  Treatment may also include: ? Having acupuncture. ? Avoiding foods that bring on migraine headaches. ? Learning ways to control your body functions (biofeedback). ? Therapy to help you know and deal with negative thoughts (cognitive behavioral therapy). Follow these instructions at home: Medicines  Take over-the-counter and prescription medicines only as told by your doctor.  Ask your doctor if the medicine prescribed to you: ? Requires you to avoid driving or using heavy machinery. ? Can cause trouble pooping (constipation). You may need to take these steps to prevent or treat trouble pooping:  Drink enough fluid to keep your pee (urine) pale yellow.  Take over-the-counter or prescription medicines.  Eat foods that are high in fiber. These include beans, whole grains, and fresh fruits and vegetables.  Limit foods that are high in fat and sugar. These include fried or sweet foods. Lifestyle  Do not drink alcohol.  Do not use any products that contain nicotine or tobacco, such as cigarettes, e-cigarettes, and chewing tobacco. If you need help quitting, ask your doctor.  Get at least 8 hours of sleep every night.  Limit and deal with stress. General instructions  Keep a journal to find out what may bring on your migraine headaches. For example, write down: ? What you eat and drink. ? How much sleep you get. ? Any change in what you eat or drink. ? Any change in your   medicines.  If you have a migraine headache: ? Avoid things that make your symptoms worse, such as bright lights. ? It may help to lie down in a dark, quiet room. ? Do not drive or use heavy machinery. ? Ask your doctor what activities are safe for you.  Keep all follow-up visits as told by your doctor. This is important.      Contact  a doctor if:  You get a migraine headache that is different or worse than others you have had.  You have more than 15 headache days in one month. Get help right away if:  Your migraine headache gets very bad.  Your migraine headache lasts longer than 72 hours.  You have a fever.  You have a stiff neck.  You have trouble seeing.  Your muscles feel weak or like you cannot control them.  You start to lose your balance a lot.  You start to have trouble walking.  You pass out (faint).  You have a seizure. Summary  A migraine headache is a very strong throbbing pain on one side or both sides of your head. These headaches can also cause other symptoms.  This condition may be treated with medicines and changes to your lifestyle.  Keep a journal to find out what may bring on your migraine headaches.  Contact a doctor if you get a migraine headache that is different or worse than others you have had.  Contact your doctor if you have more than 15 headache days in a month. This information is not intended to replace advice given to you by your health care provider. Make sure you discuss any questions you have with your health care provider. Document Revised: 07/02/2018 Document Reviewed: 04/22/2018 Elsevier Patient Education  2021 Elsevier Inc.    If you have lab work done today you will be contacted with your lab results within the next 2 weeks.  If you have not heard from us then please contact us. The fastest way to get your results is to register for My Chart.   IF you received an x-ray today, you will receive an invoice from [facility]. Please contact [facility] at [phone] with questions or concerns regarding your invoice.   IF you received labwork today, you will receive an invoice from [facility]. Please contact [facility] at [phone] with questions or concerns regarding your invoice.   Our billing staff will not be able to assist you with  questions regarding bills from these companies.  You will be contacted with the lab results as soon as they are available. The fastest way to get your results is to activate your My Chart account. Instructions are located on the last page of this paperwork. If you have not heard from us regarding the results in 2 weeks, please contact this office.      

## 2020-04-05 ENCOUNTER — Telehealth: Payer: Self-pay | Admitting: Family Medicine

## 2020-04-05 NOTE — Telephone Encounter (Signed)
Patient has been informed of the referral status for ophthalmologist and neurologist referral .

## 2020-04-05 NOTE — Telephone Encounter (Signed)
Patient is calling to follow up on recent referral has not heard anything yet, Wants to check on status

## 2020-04-06 NOTE — Telephone Encounter (Signed)
Pt called and stated he did hear back from referral from [facility] office stating that FNP Just told him it was suppose to be stat but the office stated they are the ones that determine that. Pt is wondering if he needs to be sent to another office to be seen sooner than April. Please advise.

## 2020-04-09 ENCOUNTER — Encounter (HOSPITAL_COMMUNITY): Payer: Self-pay | Admitting: Emergency Medicine

## 2020-04-09 ENCOUNTER — Other Ambulatory Visit: Payer: Self-pay

## 2020-04-09 ENCOUNTER — Ambulatory Visit (HOSPITAL_COMMUNITY)
Admission: EM | Admit: 2020-04-09 | Discharge: 2020-04-09 | Disposition: A | Discharge status: Home / Self Care | Payer: 59 | Attending: Emergency Medicine | Admitting: Emergency Medicine

## 2020-04-09 DIAGNOSIS — B349 Viral infection, unspecified: Secondary | ICD-10-CM | POA: Diagnosis not present

## 2020-04-09 DIAGNOSIS — U071 COVID-19: Secondary | ICD-10-CM | POA: Insufficient documentation

## 2020-04-09 DIAGNOSIS — Z20822 Contact with and (suspected) exposure to covid-19: Secondary | ICD-10-CM | POA: Insufficient documentation

## 2020-04-09 LAB — POC INFLUENZA A AND B ANTIGEN (URGENT CARE ONLY)
Influenza A Ag: NEGATIVE
Influenza B Ag: NEGATIVE

## 2020-04-09 MED ORDER — IBUPROFEN 600 MG PO TABS: 600.0000 mg | ORAL_TABLET | Freq: Four times a day (QID) | ORAL | 0 refills | Status: DC | PRN

## 2020-04-09 NOTE — ED Triage Notes (Signed)
Pt presents with fever chills and body aches xs 3 days. States temp last pm was 102. States last dose of ibuprofen 200 mg was 2-3 hours ago.

## 2020-04-09 NOTE — Telephone Encounter (Signed)
Please help pt get scheduled referral received by them 04/03/2020 but pt has not gotten call or been scheduled

## 2020-04-09 NOTE — Discharge Instructions (Addendum)
I will contact you if your flu is positive and will call in Tamiflu.  The meantime take 600 mg of ibuprofen combined with 1000 mg of Tylenol 3-4 times a day as needed for body aches, headaches.  Push plenty of electrolyte containing fluids such as Pedialyte or Gatorade and trying to urine is clear.  If your COVID is positive, then get a pulse oximeter to monitor your oxygen levels.  Go to the ER if they are consistently below 90%.  Walk every day to keep your lungs open and prevent a blood clot.

## 2020-04-09 NOTE — ED Provider Notes (Signed)
HPI  SUBJECTIVE:  Todd [name] is a 60 y.o. male who presents with 3 days of fevers T-max 102, chills, weakness, body aches, headaches, cough.  He had a sore throat which has resolved.  No nasal congestion, rhinorrhea, loss of sense of smell or taste.  He is able to sleep at night without waking up coughing.  No known COVID or flu exposure.  He did not get the flu vaccine.  He got the second dose of the Pfizer vaccine 6 months ago.  He took Tylenol within 6 hours of evaluation.  He states this helps his symptoms.  No aggravating factors.  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  No history of coronary disease, hypercholesterolemia, diabetes, hypertension and pulmonary disease, chronic kidney disease, anticoagulant/antiplatelet use.  JSE:[id number], Todd Patrick, MD   Past Medical History:  Diagnosis Date  . Complicated migraine   . Diverticulosis   . External hemorrhoids   . External hemorrhoids   . Headache   . Hx of cardiovascular stress test    Myoview 8/10:  no ischemia, EF 62%; low risk  . Hx of Doppler ultrasound    Carotid US 6/15:  bilat ICA 1-39%  . Hx of echocardiogram    echo 6/15:  EF 60-65%, no RWMA  . Internal hemorrhoids   . Palpitations   . Stroke ([place])   . TIA (transient ischemic attack)   . Tubular adenoma of colon   . Vision abnormalities     Past Surgical History:  Procedure Laterality Date  . APPENDECTOMY  12/01/11   lap appy  . LAPAROSCOPIC APPENDECTOMY  12/02/2011   Procedure: APPENDECTOMY LAPAROSCOPIC;  Surgeon: Madilyn Hook, DO;  Location: [facility];  Service: General;  Laterality: N/A;    Family History  Problem Relation Age of Onset  . Hypertension Mother   . Hypertension Father   . Anemia Neg Hx   . Arrhythmia Neg Hx   . Asthma Neg Hx   . Clotting disorder Neg Hx   . Fainting Neg Hx   . Heart attack Neg Hx   . Heart disease Neg Hx   . Heart failure Neg Hx   . Hyperlipidemia Neg Hx   . Colon cancer Neg Hx   . Esophageal cancer Neg Hx    . Stomach cancer Neg Hx   . Rectal cancer Neg Hx     Social History   Tobacco Use  . Smoking status: Never Smoker  . Smokeless tobacco: Never Used  Vaping Use  . Vaping Use: Never used  Substance Use Topics  . Alcohol use: No  . Drug use: No    No current facility-administered medications for this encounter.  Current Outpatient Medications:  .  ibuprofen (ADVIL) 600 MG tablet, Take 1 tablet (600 mg total) by mouth every 6 (six) hours as needed., Disp: 30 tablet, Rfl: 0 .  rizatriptan (MAXALT) 5 MG tablet, Take 1 tablet (5 mg total) by mouth as needed for migraine. May repeat in 2 hours if needed, Disp: 10 tablet, Rfl: 0  Allergies  Allergen Reactions  . Wellbutrin [Bupropion Hcl] Itching and Rash     ROS  As noted in HPI.   Physical Exam  BP 102/66 (BP Location: Right Arm)   Pulse 82   Temp 98.9 F (37.2 C) (Oral)   Resp 17   SpO2 98%   Constitutional: Well developed, well nourished, no acute distress Eyes:  EOMI, conjunctiva normal bilaterally HENT: Normocephalic, atraumatic,mucus membranes moist Respiratory:  Normal inspiratory effort, lungs clear bilaterally  cardiovascular: Normal rate, regular rhythm no murmurs rubs or gallops GI: nondistended skin: No rash, skin intact Musculoskeletal: no deformities Neurologic: Alert & oriented x 3, no focal neuro deficits Psychiatric: Speech and behavior appropriate   ED Course   Medications - No data to display  Orders Placed This Encounter  Procedures  . SARS CORONAVIRUS 2 (TAT 6-24 HRS) Nasopharyngeal Nasopharyngeal Swab    Standing Status:   Standing    Number of Occurrences:   1    Order Specific Question:   Is this test for diagnosis or screening    Answer:   Diagnosis of ill patient    Order Specific Question:   Symptomatic for COVID-19 as defined by CDC    Answer:   Yes    Order Specific Question:   Date of Symptom Onset    Answer:   04/07/2020    Order Specific Question:   Hospitalized for  COVID-19    Answer:   No    Order Specific Question:   Admitted to ICU for COVID-19    Answer:   No    Order Specific Question:   Previously tested for COVID-19    Answer:   No    Order Specific Question:   Resident in a congregate (group) care setting    Answer:   No    Order Specific Question:   Employed in healthcare setting    Answer:   No    Order Specific Question:   Has patient completed COVID vaccination(s) (2 doses of Pfizer/Moderna 1 dose of [employer])    Answer:   Yes    Results for orders placed or performed during the hospital encounter of 04/09/20 (from the past 24 hour(s))  SARS CORONAVIRUS 2 (TAT 6-24 HRS) Nasopharyngeal Nasopharyngeal Swab     Status: Abnormal   Collection Time: 04/09/20  5:20 PM   Specimen: Nasopharyngeal Swab  Result Value Ref Range   SARS Coronavirus 2 POSITIVE (A) NEGATIVE   No results found.  Results for orders placed or performed during the hospital encounter of 04/09/20  SARS CORONAVIRUS 2 (TAT 6-24 HRS) Nasopharyngeal Nasopharyngeal Swab   Specimen: Nasopharyngeal Swab  Result Value Ref Range   SARS Coronavirus 2 POSITIVE (A) NEGATIVE  POC Influenza A & B Ag (Urgent Care)  Result Value Ref Range   Influenza A Ag NEGATIVE NEGATIVE   Influenza B Ag NEGATIVE NEGATIVE     ED Clinical Impression  1. COVID-19 virus infection   2. Viral illness   3. Encounter for laboratory testing for COVID-19 virus      ED Assessment/Plan  Suspect COVID.  Will place ambulatory referral to [place] treatment team due to history of TIA.  Advised him to get a pulse oximeter and to walk every day if COVID is positive.  Will check flu.  Patient states he does not need anything for cough.  Sending home with ibuprofen for him to take with Tylenol.  Flu A, B-.  Plan as above  Placed ambulatory referral to [place] treatment team.  Discussed labs, MDM, treatment plan, and plan for follow-up with patient. Discussed sn/sx that should prompt return to the  ED. patient agrees with plan.   Meds ordered this encounter  Medications  . ibuprofen (ADVIL) 600 MG tablet    Sig: Take 1 tablet (600 mg total) by mouth every 6 (six) hours as needed.    Dispense:  30 tablet    Refill:  0    *This clinic note was created using [employer]. Therefore, there may be occasional mistakes despite careful proofreading.   ?    Melynda Ripple, MD 04/10/20 [phone]

## 2020-04-10 ENCOUNTER — Emergency Department (HOSPITAL_BASED_OUTPATIENT_CLINIC_OR_DEPARTMENT_OTHER)
Admission: EM | Admit: 2020-04-10 | Discharge: 2020-04-11 | Disposition: A | Discharge status: Home / Self Care | Payer: 59 | Attending: Emergency Medicine | Admitting: Emergency Medicine

## 2020-04-10 ENCOUNTER — Encounter (HOSPITAL_BASED_OUTPATIENT_CLINIC_OR_DEPARTMENT_OTHER): Payer: Self-pay

## 2020-04-10 ENCOUNTER — Other Ambulatory Visit: Payer: Self-pay

## 2020-04-10 ENCOUNTER — Emergency Department (HOSPITAL_BASED_OUTPATIENT_CLINIC_OR_DEPARTMENT_OTHER): Payer: 59

## 2020-04-10 DIAGNOSIS — U071 COVID-19: Secondary | ICD-10-CM

## 2020-04-10 DIAGNOSIS — J069 Acute upper respiratory infection, unspecified: Secondary | ICD-10-CM | POA: Diagnosis present

## 2020-04-10 LAB — SARS CORONAVIRUS 2 (TAT 6-24 HRS): SARS Coronavirus 2: POSITIVE — AB

## 2020-04-10 NOTE — ED Triage Notes (Addendum)
Pt reports +covid test yesterday-c/o SOB with sats in the mid 80s home O2 sat-NAD-steady gait

## 2020-04-10 NOTE — ED Provider Notes (Signed)
[facility] EMERGENCY DEPARTMENT Provider Note   CSN: [id number] Arrival date & time: 04/10/20  1926     History Chief Complaint  Patient presents with  . Covid Positive    Todd [name] is a 60 y.o. male.  The history is provided by the patient.  URI Presenting symptoms: congestion   Presenting symptoms: no fever   Severity:  Moderate Onset quality:  Gradual Timing:  Constant Progression:  Unchanged Chronicity:  New Relieved by:  Nothing Worsened by:  Nothing Ineffective treatments:  None tried Associated symptoms: no arthralgias and no wheezing   Risk factors: not elderly   States he bought a monitor from [employer] and O2 saturation was 90%.  Is achy now but no f/c/r.  No CP.       Past Medical History:  Diagnosis Date  . Complicated migraine   . Diverticulosis   . External hemorrhoids   . External hemorrhoids   . Headache   . Hx of cardiovascular stress test    Myoview 8/10:  no ischemia, EF 62%; low risk  . Hx of Doppler ultrasound    Carotid US 6/15:  bilat ICA 1-39%  . Hx of echocardiogram    echo 6/15:  EF 60-65%, no RWMA  . Internal hemorrhoids   . Palpitations   . Stroke ([place])   . TIA (transient ischemic attack)   . Tubular adenoma of colon   . Vision abnormalities     Patient Active Problem List   Diagnosis Date Noted  . Shortness of breath 11/12/2016  . Chest pain 11/12/2016  . Heart palpitations 10/20/2013  . Situational anxiety 10/20/2013  . Migraine with visual aura 08/24/2013  . TIA (transient ischemic attack) 08/23/2013  . Blurred vision 08/23/2013  . Anomia 08/23/2013    Past Surgical History:  Procedure Laterality Date  . APPENDECTOMY  12/01/11   lap appy  . LAPAROSCOPIC APPENDECTOMY  12/02/2011   Procedure: APPENDECTOMY LAPAROSCOPIC;  Surgeon: Madilyn Hook, DO;  Location: [facility];  Service: General;  Laterality: N/A;       Family History  Problem Relation Age of Onset  . Hypertension Mother   . Hypertension Father   .  Anemia Neg Hx   . Arrhythmia Neg Hx   . Asthma Neg Hx   . Clotting disorder Neg Hx   . Fainting Neg Hx   . Heart attack Neg Hx   . Heart disease Neg Hx   . Heart failure Neg Hx   . Hyperlipidemia Neg Hx   . Colon cancer Neg Hx   . Esophageal cancer Neg Hx   . Stomach cancer Neg Hx   . Rectal cancer Neg Hx     Social History   Tobacco Use  . Smoking status: Never Smoker  . Smokeless tobacco: Never Used  Vaping Use  . Vaping Use: Never used  Substance Use Topics  . Alcohol use: No  . Drug use: No    Home Medications Prior to Admission medications   Medication Sig Start Date End Date Taking? Authorizing Provider  ibuprofen (ADVIL) 600 MG tablet Take 1 tablet (600 mg total) by mouth every 6 (six) hours as needed. 04/09/20   Melynda Ripple, MD  rizatriptan (MAXALT) 5 MG tablet Take 1 tablet (5 mg total) by mouth as needed for migraine. May repeat in 2 hours if needed 04/02/20   Just, Laurita Quint, FNP    Allergies    Wellbutrin [bupropion hcl]  Review of Systems   Review of  Systems  Constitutional: Negative for fever.  HENT: Positive for congestion.   Eyes: Negative for visual disturbance.  Respiratory: Negative for wheezing.   Cardiovascular: Negative for chest pain.  Gastrointestinal: Negative for abdominal pain.  Genitourinary: Negative for difficulty urinating.  Musculoskeletal: Negative for arthralgias.  Neurological: Negative for dizziness.  Psychiatric/Behavioral: Negative for agitation.  All other systems reviewed and are negative.   Physical Exam Updated Vital Signs BP 120/85 (BP Location: Left Arm)   Pulse 65   Temp 98.4 F (36.9 C) (Oral)   Resp 17   SpO2 99%   Physical Exam Vitals and nursing note reviewed.  Constitutional:      General: He is not in acute distress.    Appearance: Normal appearance.  HENT:     Head: Normocephalic and atraumatic.     Nose: Nose normal.  Eyes:     Conjunctiva/sclera: Conjunctivae normal.     Pupils: Pupils are  equal, round, and reactive to light.  Cardiovascular:     Rate and Rhythm: Normal rate and regular rhythm.     Pulses: Normal pulses.     Heart sounds: Normal heart sounds.  Pulmonary:     Effort: Pulmonary effort is normal.     Breath sounds: Normal breath sounds.  Abdominal:     General: Abdomen is flat. Bowel sounds are normal.     Palpations: Abdomen is soft.     Tenderness: There is no abdominal tenderness. There is no guarding or rebound.  Musculoskeletal:        General: Normal range of motion.     Cervical back: Normal range of motion and neck supple.  Skin:    General: Skin is warm and dry.     Capillary Refill: Capillary refill takes less than 2 seconds.  Neurological:     General: No focal deficit present.     Mental Status: He is alert and oriented to person, place, and time.     Deep Tendon Reflexes: Reflexes normal.  Psychiatric:        Mood and Affect: Mood normal.        Behavior: Behavior normal.     ED Results / Procedures / Treatments   Labs (all labs ordered are listed, but only abnormal results are displayed) Labs Reviewed - No data to display  EKG None  Radiology DG Chest Portable 1 View  Result Date: 04/10/2020 CLINICAL DATA:  Shortness of breath and COVID positive EXAM: PORTABLE CHEST 1 VIEW COMPARISON:  None. FINDINGS: The heart size and mediastinal contours are within normal limits. Both lungs are clear. The visualized skeletal structures are unremarkable. IMPRESSION: No active disease. Electronically Signed   By: Prudencio Pair M.D.   On: 04/10/2020 20:08    Procedures Procedures (including critical care time)  Medications Ordered in ED Medications - No data to display  ED Course  I have reviewed the triage vital signs and the nursing notes.  Pertinent labs & imaging results that were available during my care of the patient were reviewed by me and considered in my medical decision making (see chart for details).    I walked the patient  and his saturations were 99-100%.  I suspect the pulse oximeter was not accurate or not on properly.  Change batteries and continue to monitor symptoms.    Keena Corris was evaluated in Emergency Department on 04/10/2020 for the symptoms described in the history of present illness. He was evaluated in the context of the global COVID-19 pandemic, which  necessitated consideration that the patient might be at risk for infection with the SARS-CoV-2 virus that causes COVID-19. Institutional protocols and algorithms that pertain to the evaluation of patients at risk for COVID-19 are in a state of rapid change based on information released by regulatory bodies including the CDC and federal and state organizations. These policies and algorithms were followed during the patient's care in the ED.  Final Clinical Impression(s) / ED Diagnoses Return for intractable cough, coughing up blood,fevers >100.4 unrelieved by medication, shortness of breath, intractable vomiting, chest pain, shortness of breath, weakness,numbness, changes in speech, facial asymmetry,abdominal pain, passing out,Inability to tolerate liquids or food, cough, altered mental status or any concerns. No signs of systemic illness or infection. The patient is nontoxic-appearing on exam and vital signs are within normal limits.   I have reviewed the triage vital signs and the nursing notes. Pertinent labs &imaging results that were available during my care of the patient were reviewed by me and considered in my medical decision making (see chart for details).After history, exam, and medical workup I feel the patient has beenappropriately medically screened and is safe for discharge home. Pertinent diagnoses were discussed with the patient. Patient was given return precautions.   Lexani Corona, MD 04/10/20 2330

## 2020-04-10 NOTE — Telephone Encounter (Signed)
COVID-positive.  Placed ambulatory referral to [place] treatment team due to history of TIA.

## 2020-04-11 ENCOUNTER — Telehealth (INDEPENDENT_AMBULATORY_CARE_PROVIDER_SITE_OTHER): Payer: 59 | Admitting: Family Medicine

## 2020-04-11 ENCOUNTER — Encounter: Payer: Self-pay | Admitting: Family Medicine

## 2020-04-11 VITALS — Ht 67.0 in | Wt 156.0 lb

## 2020-04-11 DIAGNOSIS — R519 Headache, unspecified: Secondary | ICD-10-CM

## 2020-04-11 DIAGNOSIS — U071 COVID-19: Secondary | ICD-10-CM

## 2020-04-11 DIAGNOSIS — L568 Other specified acute skin changes due to ultraviolet radiation: Secondary | ICD-10-CM

## 2020-04-11 DIAGNOSIS — R200 Anesthesia of skin: Secondary | ICD-10-CM

## 2020-04-11 DIAGNOSIS — H539 Unspecified visual disturbance: Secondary | ICD-10-CM

## 2020-04-11 NOTE — Progress Notes (Signed)
Virtual Visit via Telephone Note  I connected with Todd [name] on 04/11/20 at 5:36 PM 5 min chart review.  by telephone and verified that I am speaking with the correct person using two identifiers. Patient location: home.  My location: office   I discussed the limitations, risks, security and privacy concerns of performing an evaluation and management service by telephone and the availability of in person appointments. I also discussed with the patient that there may be a patient responsible charge related to this service. The patient expressed understanding and agreed to proceed, consent obtained  Chief complaint:  Chief Complaint  Patient presents with  . Covid Positive    Pt tested positive for covid on 04/09/2020. Pt has  migraine, blurred vision, and nose bleed. Pt is hoping for some kind of medication to help. Pt has had 2 pfizer vaccine.      History of Present Illness:  COVID-19 infection: Evaluated at [facility] health urgent care January 17.  3-day history of fever, chills, weakness, body aches, headache and cough at that time.  Had received Pfizer COVID-vaccine, second dose 6 months prior.  Noted history of TIA with migraines.  Flu testing was negative, COVID testing positive.  Symptomatic care discussed, referral was placed to [place] treatment team due to history of TIA. He was seen at [facility] emergency department yesterday with concern of home O2 sat 90%.  Vitals reviewed, O2 sat 99% in ER.  Temp 98.4, blood pressure 120/85 at that time.  Ambulatory pulse ox 99 to 100%.  covid symptoms are doing great. Feels like he is doing well. Oxygen doing ok. Initial symptoms were on Saturday 04/07/20. No fever in past 2 days.  Blurry vision and white spots in both eyes  - has been present for past month.  Seen in ER on 03/22/20 and 03/31/20. Had trouble reading names on phone on 12/30. Confusion improved in ER, persistent HA. CBC without leukocytosis. CMP without acidosis. No AKI,  LFTS ok. EKG without signs of ischemia. CT head negative, did have sinus mucosal disease.  Referred to neurologist. Had appt to see neurology in March. migraine cocktail in ER did help. Possible migraine cause of symptoms.   Seen again at Urgent Care 03/31/20 as symptoms had improved then returned with blurry vision and confusion. Reported numbness in left arm had been there as well - comes and goes. Sent to ER - left AMA due to wait time, and felt better so he left. Offered MRI, but declined. Normal repeat head CT on 03/31/20, with resolution of prior R maxilary sinus inflammatory changes.   telemed Appt with [facility] 04/02/20. Still white spots in vision, numbness in left arm. And reports he was still having light sensitivity. Rx given for maxalt - tried once  - only helped a little. Has not taken again.   Referred to optho on 1/10  Urgent referral - told would have appt in March. [facility]? MRI brain ordered on 1/10. Has not received appt for MRI yet.  Repeat referral to neuro on 1/10 - urgent. Added to cancellation list.   Taking ibuprofen 600 mg every 6-8, tylenol 1000mg  every 6-8 hours. Helps headache, still has the blurry vision, and light sensitivity. No white spots today. Episode of numbness in left arm for 30-40 mins today, then resolved.  No focal weakness, no slurred speech.  Denies confusion currently, short episode earlier today.  Nosebleed today, stopped with pressure.  Has not recurred.   Blurry vision, off  and on for weeks, no recent changes.  white spots one time only and resolved.  No current headache. No neck pain or stiffness. Sound sensitivity at times with headache only. Numbness in left arm comes and goes. Not feeling currently.  Light sensitivity has persisted for past few weeks, sometimes different intensities, but has been persistent.    Patient Active Problem List   Diagnosis Date Noted  . Shortness of breath 11/12/2016  . Chest pain 11/12/2016  . Heart palpitations  10/20/2013  . Situational anxiety 10/20/2013  . Migraine with visual aura 08/24/2013  . TIA (transient ischemic attack) 08/23/2013  . Blurred vision 08/23/2013  . Anomia 08/23/2013   Past Medical History:  Diagnosis Date  . Complicated migraine   . Diverticulosis   . External hemorrhoids   . External hemorrhoids   . Headache   . Hx of cardiovascular stress test    Myoview 8/10:  no ischemia, EF 62%; low risk  . Hx of Doppler ultrasound    Carotid US 6/15:  bilat ICA 1-39%  . Hx of echocardiogram    echo 6/15:  EF 60-65%, no RWMA  . Internal hemorrhoids   . Palpitations   . Stroke ([place])   . TIA (transient ischemic attack)   . Tubular adenoma of colon   . Vision abnormalities    Past Surgical History:  Procedure Laterality Date  . APPENDECTOMY  12/01/11   lap appy  . LAPAROSCOPIC APPENDECTOMY  12/02/2011   Procedure: APPENDECTOMY LAPAROSCOPIC;  Surgeon: Madilyn Hook, DO;  Location: [facility];  Service: General;  Laterality: N/A;   Allergies  Allergen Reactions  . Wellbutrin [Bupropion Hcl] Itching and Rash   Prior to Admission medications   Medication Sig Start Date End Date Taking? Authorizing Provider  ibuprofen (ADVIL) 600 MG tablet Take 1 tablet (600 mg total) by mouth every 6 (six) hours as needed. 04/09/20  Yes Melynda Ripple, MD  rizatriptan (MAXALT) 5 MG tablet Take 1 tablet (5 mg total) by mouth as needed for migraine. May repeat in 2 hours if needed 04/02/20  Yes Just, Laurita Quint, FNP   Social History   Socioeconomic History  . Marital status: Married    Spouse name: Not on file  . Number of children: Not on file  . Years of education: Not on file  . Highest education level: Not on file  Occupational History  . Not on file  Tobacco Use  . Smoking status: Never Smoker  . Smokeless tobacco: Never Used  Vaping Use  . Vaping Use: Never used  Substance and Sexual Activity  . Alcohol use: No  . Drug use: No  . Sexual activity: Not on file  Other Topics Concern   . Not on file  Social History Narrative  . Not on file   Social Determinants of Health   Financial Resource Strain: Not on file  Food Insecurity: Not on file  Transportation Needs: Not on file  Physical Activity: Not on file  Stress: Not on file  Social Connections: Not on file  Intimate Partner Violence: Not on file     Observations/Objective: Vitals:   04/11/20 1402  Weight: 156 lb (70.8 kg)  Height: 5\' 7"  (1.702 m)     Assessment and Plan: Vision changes  Photosensitivity  Arm numbness  Nonintractable episodic headache, unspecified headache type  COVID-19 virus infection   Relapsing/remitting photosensitivity, episodic confusion, headache, left arm numbness for past few weeks.  2 separate CT head imaging on December 30 and  January 8 without acute findings.  Now with COVID-19 infection since January 15 with some improvement in symptoms.  Denies current headache, denies change in symptoms or change in frequency of symptoms.  Question of possible migraine cause, but with persistent visual symptoms, differential includes ophthalmic cause versus ophthalmic migraine.  Unlikely meningitis without neck symptoms and intermittent symptoms.  Phonophobia only with headache, denies currently.  Referral has been placed to neurology, for MRI, and to ophthalmology, but question of timing of those appointments.  -As COVID symptoms are improving, continue symptomatic care, isolation timing and precautions discussed including fifth day being tomorrow as long as he is fever free off antipyretics, followed by masking for 5 additional days.  -Single episode of epistaxis.  ER/911 precautions if recurrent or unable to stop bleeding on his own with pressure.  -We will try to have him see ophthalmology soon, but with COVID infection that may delay in office evaluation, will need to discuss with ophthalmology.  ER precautions discussed if any increased visual symptoms, or change in symptoms.  -ER  precautions given if any focal weakness, worsening headache, worsening visual symptoms, or new neurologic symptoms.  Understanding expressed.    Follow Up Instructions: To be determined by MRI, ophthalmic follow-up and neuro appointment.   I discussed the assessment and treatment plan with the patient. The patient was provided an opportunity to ask questions and all were answered. The patient agreed with the plan and demonstrated an understanding of the instructions.   The patient was advised to call back or seek an in-person evaluation if the symptoms worsen or if the condition fails to improve as anticipated.  I provided 38  minutes of non-face-to-face time during this encounter.  Signed,   Merri Ray, MD [facility] [place].  04/11/20

## 2020-04-12 ENCOUNTER — Telehealth: Payer: Self-pay | Admitting: Family Medicine

## 2020-04-12 NOTE — Telephone Encounter (Signed)
Patient is calling because he received a call form Dr. Jenetta Downer office ,they left voice mail that Dr. Carlota Raspberry had sent over a referral and they left a message for him to call/ when the patient returned their call they stated that they did not see an referral.  The same thing happened the last  Time    Please advise

## 2020-04-26 ENCOUNTER — Telehealth: Payer: Self-pay | Admitting: Family Medicine

## 2020-04-26 NOTE — Telephone Encounter (Addendum)
04/26/2020 - I RECEIVED THIS CALL FROM CEDTORIA FROM [facility]. PATIENT IS SCHEDULED TO HAVE A MRI OF THE BRAIN ON 05/01/2020 AT 9:40 am [place]. HER OFFICE NEEDS THE PRE-AUTHORIZATION FROM HIS INSURANCE COMPANY BEFORE HE CAN HAVE THE SCAN DONE. PLEASE CALL AS SOON AS POSSIBLE WITH THIS INFORMATION. CEDTORIA AT [facility]: [phone]  [place]

## 2020-04-30 NOTE — Telephone Encounter (Signed)
Lm to let the pt know that he is to see neuro first then they will assess for MRI rather then do the PA

## 2020-04-30 NOTE — Telephone Encounter (Signed)
04/30/2020 - I RECEIVED ANOTHER CALL FROM CEDTORIA AT [facility]. PATIENT IS SCHEDULED TO HAVE THE MRI OF HIS BRAIN TOMORROW Tuesday 05/01/2020 AT 9:40 am. SHE STILL DOES NOT HAVE PRE-AUTHORIZATION FROM [facility]. THE DENIAL IS FOR [place]  AND NOT FOR DIAGNOSTIC. [facility] USUALLY TAKES A FEW DAYS TO AUTHORIZE SO SHE WILL CALL THE PATIENT TO LET HIM KNOW THE MRI MAY HAVE TO BE CANCELLED. PLEASE RESPOND BACK AS SOON AS POSSIBLE. KELSEA JUST PATIENT. BEST PHONE [phone]  - [place]

## 2020-05-01 ENCOUNTER — Other Ambulatory Visit: Payer: 59

## 2020-05-31 ENCOUNTER — Encounter: Payer: Self-pay | Admitting: Neurology

## 2020-05-31 ENCOUNTER — Ambulatory Visit (INDEPENDENT_AMBULATORY_CARE_PROVIDER_SITE_OTHER): Payer: 59 | Admitting: Neurology

## 2020-05-31 VITALS — BP 133/81 | Ht 67.0 in | Wt 165.6 lb

## 2020-05-31 DIAGNOSIS — G43009 Migraine without aura, not intractable, without status migrainosus: Secondary | ICD-10-CM | POA: Diagnosis not present

## 2020-05-31 DIAGNOSIS — H539 Unspecified visual disturbance: Secondary | ICD-10-CM

## 2020-05-31 MED ORDER — ASPIRIN EC 81 MG PO TBEC: 81.0000 mg | DELAYED_RELEASE_TABLET | Freq: Every day | ORAL | 11 refills | Status: DC

## 2020-05-31 NOTE — Progress Notes (Signed)
[facility] [address] [place]. [place] [address] [phone]       OFFICE CONSULT NOTE  [name] Date of Birth:  1960/08/24 Medical Record Number:  [id number]   Referring MD:  Huston Foley Just, FNP  Reason for Referral: Blurred vision HPI: Todd [name] is a 60 year old [place] adult male who is seen today for office consultation visit for episode of vision disturbance.  History is obtained from Todd [name] and review of electronic medical records and I personally reviewed pertinent imaging films in PACS.  He has past medical history significant only for complicated migraine, palpitations and hemorrhoids.  He was seen in [facility] ER on 1230/21 and 03/31/2020 for recurrent episodes of transient vision disturbance.  He states he is sore spots in front of both eyes and it was as if you looking at Todd sun and when he looked away he still some bright spots.  He was driving and also noticed some difficulty in remembering Todd names of his friends.  He could look at Todd name on his phone but could not recall who it was.  He also noticed some numbness in his left arm.  There was no immediate associated headache but later on developed throbbing what takes headache which resolved in an hour or so.  Todd visual spots resolved in 15 to 20 minutes.  Todd [name] was seen in [facility] ER and neurological exam is completely back to normal.  CT scan of Todd head was obtained which was unremarkable except for mild chronic paranasal sinusitis changes.  He returned to [facility] ER on 03/31/2020 with similar symptoms of seeing spots in front of both his eyes with photophobia and light bothering him as well as some numbness in Todd left arm.  This time did not have trouble remembering names.  He was quite anxious and almost panicked and felt he was having a stroke.  Inquiry admits to similar episodes in 2015.  In fact he was seen by me at that time.  He presented on 08/23/2013 while working on his computer he noticed blurred  vision in Todd form of spots in front of both eyes which lasted about an hour.  He noticed trouble reading and difficulty coming up with names of people including people that he had known quite well.  He attributed this to low blood pressure and ate some pickles and symptoms resolved by Todd time he reached Todd emergency room.  MRI scan of Todd brain was obtained.  On 08/23/2013 which showed a questionable left occipital diffusion hyperintensity which did not have any corresponding ADC map and was unclear if this was really a stroke or not.  MRA looked pretty unremarkable with neuroradiology report mentions a medial irregularity of Todd left P3 segment.  Todd [name] was felt to have possibly complicated migraine episode by me.  I recommended Todd [name] take Depakote ER for prophylaxis but Todd [name] read Todd side effects and did not want to take medicine.  He was seen by me for follow-up in Todd clinic on 11/05/2015 and at that time had reported 5-6 similar recurrent stereotypical episodes of visual dysfunction along with left arm numbness lasting for 45 minutes to an hour and some of these episodes.  I recommended an EEG and a trial of Depakote ER but per Todd [name] shows to do neither.  He states he did well for.  Of 2 to end of years when he did not have episodes and now is he has had 2 episodes again in December 2021  and January this year.  He denies any specific triggers for these episodes.  He denies any remote history of migraine headaches or family history of migraines.  He does not take any prescription medications.  He denies any other symptoms suggestive of strokes or TIAs.  Does not have significant vascular risk factors except elevated lipids.  He does not smoke or drink  ROS:   14 system review of systems is positive for vision disturbance, white spots, blurred vision, headache, numbness all other systems negative  PMH:  Past Medical History:  Diagnosis Date  . Complicated migraine   . Diverticulosis   .  External hemorrhoids   . External hemorrhoids   . Headache   . Hx of cardiovascular stress test    Myoview 8/10:  no ischemia, EF 62%; low risk  . Hx of Doppler ultrasound    Carotid US 6/15:  bilat ICA 1-39%  . Hx of echocardiogram    echo 6/15:  EF 60-65%, no RWMA  . Internal hemorrhoids   . Palpitations   . Stroke ([place])   . TIA (transient ischemic attack)   . Tubular adenoma of colon   . Vision abnormalities     Social History:  Social History   Socioeconomic History  . Marital status: Not on file    Spouse name: Not on file  . Number of children: Not on file  . Years of education: Not on file  . Highest education level: Not on file  Occupational History  . Occupation: part time  Tobacco Use  . Smoking status: Never Smoker  . Smokeless tobacco: Never Used  Vaping Use  . Vaping Use: Never used  Substance and Sexual Activity  . Alcohol use: No  . Drug use: No  . Sexual activity: Not on file  Other Topics Concern  . Not on file  Social History Narrative   Lives alone   Right Handed   Drinks very little caffeine   Social Determinants of Health   Financial Resource Strain: Not on file  Food Insecurity: Not on file  Transportation Needs: Not on file  Physical Activity: Not on file  Stress: Not on file  Social Connections: Not on file  Intimate Partner Violence: Not on file    Medications:   Current Outpatient Medications on File Prior to Visit  Medication Sig Dispense Refill  . ibuprofen (ADVIL) 600 MG tablet Take 1 tablet (600 mg total) by mouth every 6 (six) hours as needed. 30 tablet 0  . rizatriptan (MAXALT) 5 MG tablet Take 1 tablet (5 mg total) by mouth as needed for migraine. May repeat in 2 hours if needed 10 tablet 0   No current facility-administered medications on file prior to visit.    Allergies:   Allergies  Allergen Reactions  . Wellbutrin [Bupropion Hcl] Itching and Rash    Physical Exam General: well developed, well nourished  middle-aged [place] male, seated, in no evident distress Head: head normocephalic and atraumatic.   Neck: supple with no carotid or supraclavicular bruits Cardiovascular: regular rate and rhythm, no murmurs Musculoskeletal: no deformity Skin:  no rash/petichiae Vascular:  Normal pulses all extremities  Neurologic Exam Mental Status: Awake and fully alert. Oriented to place and time. Recent and remote memory intact. Attention span, concentration and fund of knowledge appropriate. Mood and affect appropriate.  Cranial Nerves: Fundoscopic exam reveals sharp disc margins. Pupils equal, briskly reactive to light. Extraocular movements full without nystagmus. Visual fields full to confrontation. Hearing intact. Facial  sensation intact. Face, tongue, palate moves normally and symmetrically.  Motor: Normal bulk and tone. Normal strength in all tested extremity muscles. Sensory.: intact to touch , pinprick , position and vibratory sensation.  Coordination: Rapid alternating movements normal in all extremities. Finger-to-nose and heel-to-shin performed accurately bilaterally. Gait and Station: Arises from chair without difficulty. Stance is normal. Gait demonstrates normal stride length and balance . Able to heel, toe and tandem walk without difficulty.  Reflexes: 1+ and symmetric. Toes downgoing.   NIHSS  0 Modified Rankin  0  ASSESSMENT: 60 year old [place] male with recurrent stereotypical episodes of transient visual dysfunction followed by headache and left upper extremity numbness likely complicated migraine episodes since 2015 occurring at a frequency of once every 5 to 6 months..  Doubt TIA or strokes.     PLAN: I had a long discussion with Todd [name] regarding his recurrent stereotypical episodes of bilateral vision disturbance followed by headache and left upper extremity numbness likely representing complicated migraine episodes rather than posterior circulation TIAs.  He however  remains at risk for strokes and hence I recommend he start taking aspirin 81 mg daily as well as check lipid profile and hemoglobin A1c and maintain aggressive risk factor modification with blood pressure control below 130/90, lipids with LDL cholesterol goal below 70 mg percent and diabetes with hemoglobin A1c goal below 6.5%.  Check MRI scan of Todd brain and MRA of Todd brain and neck for stroke with stratification.  I have advised him to avoid migraine triggers like sleep deprivation, excessive caffeine, soda, alcohol.  He was advised to use Tylenol or ibuprofen for symptomatic relief and if it is not effective I will try stronger medications as he appears reluctant to try prescription medications.  He will return for follow-up in Todd future in 3 months or call earlier if necessary.  Greater than 50% time during this 45-minute consultation visit was spent on counseling and coordination of care about complicated migraine and risk for stroke and discussion about stroke prevention and answering questions. Antony Contras, MD Note: This document was prepared with digital dictation and possible smart phrase technology. Any transcriptional errors that result from this process are unintentional.

## 2020-05-31 NOTE — Patient Instructions (Addendum)
I had a long discussion with the patient regarding his recurrent stereotypical episodes of bilateral vision disturbance followed by headache and left upper extremity numbness likely representing complicated migraine episodes rather than posterior circulation TIAs.  He however remains at risk for strokes and hence I recommend he start taking aspirin 81 mg daily as well as check lipid profile and hemoglobin A1c and maintain aggressive risk factor modification with blood pressure control below 130/90, lipids with LDL cholesterol goal below 70 mg percent and diabetes with hemoglobin A1c goal below 6.5%.  Check MRI scan of the brain and MRA of the brain and neck for stroke with stratification.  I have advised him to avoid migraine triggers like sleep deprivation, excessive caffeine, soda, alcohol.  He was advised to use Tylenol or ibuprofen for symptomatic relief and if it is not effective I will try stronger medications as he appears reluctant to try prescription medications.  He will return for follow-up in the future in 3 months or call earlier if necessary.  Stroke Prevention Some medical conditions and behaviors are associated with a higher chance of having a stroke. You can help prevent a stroke by making nutrition, lifestyle, and other changes, including managing any medical conditions you may have. What nutrition changes can be made?  Eat healthy foods. You can do this by: ? Choosing foods high in fiber, such as fresh fruits and vegetables and whole grains. ? Eating at least 5 or more servings of fruits and vegetables a day. Try to fill half of your plate at each meal with fruits and vegetables. ? Choosing lean protein foods, such as lean cuts of meat, poultry without skin, fish, tofu, beans, and nuts. ? Eating low-fat dairy products. ? Avoiding foods that are high in salt (sodium). This can help lower blood pressure. ? Avoiding foods that have saturated fat, trans fat, and cholesterol. This can help  prevent high cholesterol. ? Avoiding processed and premade foods.  Follow your health care provider's specific guidelines for losing weight, controlling high blood pressure (hypertension), lowering high cholesterol, and managing diabetes. These may include: ? Reducing your daily calorie intake. ? Limiting your daily sodium intake to 1,500 milligrams (mg). ? Using only healthy fats for cooking, such as olive oil, canola oil, or sunflower oil. ? Counting your daily carbohydrate intake.   What lifestyle changes can be made?  Maintain a healthy weight. Talk to your health care provider about your ideal weight.  Get at least 30 minutes of moderate physical activity at least 5 days a week. Moderate activity includes brisk walking, biking, and swimming.  Do not use any products that contain nicotine or tobacco, such as cigarettes and e-cigarettes. If you need help quitting, ask your health care provider. It may also be helpful to avoid exposure to secondhand smoke.  Limit alcohol intake to no more than 1 drink a day for nonpregnant women and 2 drinks a day for men. One drink equals 12 oz of beer, 5 oz of wine, or 1 oz of hard liquor.  Stop any illegal drug use.  Avoid taking birth control pills. Talk to your health care provider about the risks of taking birth control pills if: ? You are over 13 years old. ? You smoke. ? You get migraines. ? You have ever had a blood clot. What other changes can be made?  Manage your cholesterol levels. ? Eating a healthy diet is important for preventing high cholesterol. If cholesterol cannot be managed through diet alone, you  may also need to take medicines. ? Take any prescribed medicines to control your cholesterol as told by your health care provider.  Manage your diabetes. ? Eating a healthy diet and exercising regularly are important parts of managing your blood sugar. If your blood sugar cannot be managed through diet and exercise, you may need to  take medicines. ? Take any prescribed medicines to control your diabetes as told by your health care provider.  Control your hypertension. ? To reduce your risk of stroke, try to keep your blood pressure below 130/80. ? Eating a healthy diet and exercising regularly are an important part of controlling your blood pressure. If your blood pressure cannot be managed through diet and exercise, you may need to take medicines. ? Take any prescribed medicines to control hypertension as told by your health care provider. ? Ask your health care provider if you should monitor your blood pressure at home. ? Have your blood pressure checked every year, even if your blood pressure is normal. Blood pressure increases with age and some medical conditions.  Get evaluated for sleep disorders (sleep apnea). Talk to your health care provider about getting a sleep evaluation if you snore a lot or have excessive sleepiness.  Take over-the-counter and prescription medicines only as told by your health care provider. Aspirin or blood thinners (antiplatelets or anticoagulants) may be recommended to reduce your risk of forming blood clots that can lead to stroke.  Make sure that any other medical conditions you have, such as atrial fibrillation or atherosclerosis, are managed. What are the warning signs of a stroke? The warning signs of a stroke can be easily remembered as BEFAST.  B is for balance. Signs include: ? Dizziness. ? Loss of balance or coordination. ? Sudden trouble walking.  E is for eyes. Signs include: ? A sudden change in vision. ? Trouble seeing.  F is for face. Signs include: ? Sudden weakness or numbness of the face. ? The face or eyelid drooping to one side.  A is for arms. Signs include: ? Sudden weakness or numbness of the arm, usually on one side of the body.  S is for speech. Signs include: ? Trouble speaking (aphasia). ? Trouble understanding.  T is for time. ? These symptoms  may represent a serious problem that is an emergency. Do not wait to see if the symptoms will go away. Get medical help right away. Call your local emergency services (911 in the [place] Do not drive yourself to the hospital.  Other signs of stroke may include: ? A sudden, severe headache with no known cause. ? Nausea or vomiting. ? Seizure. Where to find more information For more information, visit:  American Stroke Association: www.strokeassociation.org  National Stroke Association: www.stroke.org Summary  You can prevent a stroke by eating healthy, exercising, not smoking, limiting alcohol intake, and managing any medical conditions you may have.  Do not use any products that contain nicotine or tobacco, such as cigarettes and e-cigarettes. If you need help quitting, ask your health care provider. It may also be helpful to avoid exposure to secondhand smoke.  Remember BEFAST for warning signs of stroke. Get help right away if you or a loved one has any of these signs. This information is not intended to replace advice given to you by your health care provider. Make sure you discuss any questions you have with your health care provider. Document Revised: 02/20/2017 Document Reviewed: 04/15/2016 Elsevier Patient Education  2021 [employer].

## 2020-06-01 LAB — LIPID PANEL
Chol/HDL Ratio: 4.3 ratio (ref 0.0–5.0)
Cholesterol, Total: 231 mg/dL — ABNORMAL HIGH (ref 100–199)
HDL: 54 mg/dL (ref >39)
LDL Chol Calc (NIH): 162 mg/dL — ABNORMAL HIGH (ref 0–99)
Triglycerides: 88 mg/dL (ref 0–149)
VLDL Cholesterol Cal: 15 mg/dL (ref 5–40)

## 2020-06-01 LAB — HEMOGLOBIN A1C
Est. average glucose Bld gHb Est-mCnc: 111 mg/dL
Hgb A1c MFr Bld: 5.5 % (ref 4.8–5.6)

## 2020-06-04 ENCOUNTER — Telehealth: Payer: Self-pay | Admitting: Neurology

## 2020-06-04 NOTE — Telephone Encounter (Signed)
[facility] auth: [id number] (exp. 06/04/20 to 07/03/20) order sent to GI. They will reach out to the patient to schedule.

## 2020-06-05 ENCOUNTER — Other Ambulatory Visit: Payer: Self-pay

## 2020-06-05 ENCOUNTER — Telehealth: Payer: Self-pay | Admitting: *Deleted

## 2020-06-05 ENCOUNTER — Ambulatory Visit: Payer: 59 | Admitting: Registered Nurse

## 2020-06-05 ENCOUNTER — Encounter: Payer: Self-pay | Admitting: Registered Nurse

## 2020-06-05 VITALS — BP 117/73 | HR 68 | Temp 98.0°F | Resp 18 | Ht 67.0 in | Wt 163.6 lb

## 2020-06-05 DIAGNOSIS — B356 Tinea cruris: Secondary | ICD-10-CM

## 2020-06-05 MED ORDER — NYSTATIN 100000 UNIT/GM EX POWD: 1.0000 "application " | Freq: Three times a day (TID) | CUTANEOUS | 0 refills | Status: DC

## 2020-06-05 MED ORDER — TERBINAFINE HCL 1 % EX CREA: 1.0000 "application " | TOPICAL_CREAM | Freq: Two times a day (BID) | CUTANEOUS | 0 refills | Status: DC

## 2020-06-05 NOTE — Telephone Encounter (Signed)
Pt called for his labs results. Please call [phone]

## 2020-06-05 NOTE — Progress Notes (Signed)
Acute Office Visit  Subjective:    Patient ID: Todd [name], male    DOB: 1960/11/09, 60 y.o.   MRN: [id number]  Chief Complaint  Patient presents with  . Rash    Patient states he has noticed a rash in his groin area for a few days that has now started to itch.    HPI Patient is in today for rash  Onset 6-7 days ago In groin in crease between scrotum and thigh Red, itchy, clear border No pain or drainage.  At times appears as dry skin Has not happened before No new sexual contacts No known chemical, allergen, or other exposure Has not tried tx  Past Medical History:  Diagnosis Date  . Complicated migraine   . Diverticulosis   . External hemorrhoids   . External hemorrhoids   . Headache   . Hx of cardiovascular stress test    Myoview 8/10:  no ischemia, EF 62%; low risk  . Hx of Doppler ultrasound    Carotid US 6/15:  bilat ICA 1-39%  . Hx of echocardiogram    echo 6/15:  EF 60-65%, no RWMA  . Internal hemorrhoids   . Palpitations   . Stroke ([place])   . TIA (transient ischemic attack)   . Tubular adenoma of colon   . Vision abnormalities     Past Surgical History:  Procedure Laterality Date  . APPENDECTOMY  12/01/11   lap appy  . LAPAROSCOPIC APPENDECTOMY  12/02/2011   Procedure: APPENDECTOMY LAPAROSCOPIC;  Surgeon: Madilyn Hook, DO;  Location: [facility];  Service: General;  Laterality: N/A;    Family History  Problem Relation Age of Onset  . Hypertension Mother   . Hypertension Father   . Anemia Neg Hx   . Arrhythmia Neg Hx   . Asthma Neg Hx   . Clotting disorder Neg Hx   . Fainting Neg Hx   . Heart attack Neg Hx   . Heart disease Neg Hx   . Heart failure Neg Hx   . Hyperlipidemia Neg Hx   . Colon cancer Neg Hx   . Esophageal cancer Neg Hx   . Stomach cancer Neg Hx   . Rectal cancer Neg Hx     Social History   Socioeconomic History  . Marital status: Divorced    Spouse name: Not on file  . Number of children: Not on file  . Years of education:  Not on file  . Highest education level: Not on file  Occupational History  . Occupation: part time  Tobacco Use  . Smoking status: Never Smoker  . Smokeless tobacco: Never Used  Vaping Use  . Vaping Use: Never used  Substance and Sexual Activity  . Alcohol use: No  . Drug use: No  . Sexual activity: Not on file  Other Topics Concern  . Not on file  Social History Narrative   Lives alone   Right Handed   Drinks very little caffeine   Social Determinants of Health   Financial Resource Strain: Not on file  Food Insecurity: Not on file  Transportation Needs: Not on file  Physical Activity: Not on file  Stress: Not on file  Social Connections: Not on file  Intimate Partner Violence: Not on file    Outpatient Medications Prior to Visit  Medication Sig Dispense Refill  . ibuprofen (ADVIL) 600 MG tablet Take 1 tablet (600 mg total) by mouth every 6 (six) hours as needed. 30 tablet 0  . aspirin EC  81 MG tablet Take 1 tablet (81 mg total) by mouth daily. Swallow whole. (Patient not taking: Reported on 06/05/2020) 30 tablet 11   No facility-administered medications prior to visit.    Allergies  Allergen Reactions  . Wellbutrin [Bupropion Hcl] Itching and Rash    Review of Systems Per hpi      Objective:    Physical Exam Vitals and nursing note reviewed.  Constitutional:      Appearance: Normal appearance.  Cardiovascular:     Rate and Rhythm: Normal rate and regular rhythm.     Pulses: Normal pulses.     Heart sounds: Normal heart sounds. No murmur heard. No friction rub. No gallop.   Pulmonary:     Effort: Pulmonary effort is normal. No respiratory distress.     Breath sounds: Normal breath sounds. No stridor. No wheezing, rhonchi or rales.  Skin:    Findings: Rash (typical tinea cruris ) present.  Neurological:     General: No focal deficit present.     Mental Status: He is alert. Mental status is at baseline.  Psychiatric:        Mood and Affect: Mood  normal.        Behavior: Behavior normal.        Thought Content: Thought content normal.        Judgment: Judgment normal.     BP 117/73   Pulse 68   Temp 98 F (36.7 C) (Temporal)   Resp 18   Ht 5\' 7"  (1.702 m)   Wt 163 lb 9.6 oz (74.2 kg)   SpO2 100%   BMI 25.62 kg/m  Wt Readings from Last 3 Encounters:  06/05/20 163 lb 9.6 oz (74.2 kg)  05/31/20 165 lb 9.6 oz (75.1 kg)  04/11/20 156 lb (70.8 kg)    There are no preventive care reminders to display for this patient.  There are no preventive care reminders to display for this patient.   Lab Results  Component Value Date   TSH 3.570 09/19/2019   Lab Results  Component Value Date   WBC 8.5 03/31/2020   HGB 15.8 03/31/2020   HCT 46.0 03/31/2020   MCV 87.6 03/31/2020   PLT 273 03/31/2020   Lab Results  Component Value Date   NA 137 03/31/2020   K 4.2 03/31/2020   CO2 22 03/31/2020   GLUCOSE 132 (H) 03/31/2020   BUN 14 03/31/2020   CREATININE 1.10 03/31/2020   BILITOT 1.5 (H) 03/22/2020   ALKPHOS 55 03/22/2020   AST 21 03/22/2020   ALT 24 03/22/2020   PROT 7.2 03/22/2020   ALBUMIN 4.1 03/22/2020   CALCIUM 9.0 03/31/2020   ANIONGAP 12 03/31/2020   GFR 82.38 07/12/2014   Lab Results  Component Value Date   CHOL 231 (H) 05/31/2020   Lab Results  Component Value Date   HDL 54 05/31/2020   Lab Results  Component Value Date   LDLCALC 162 (H) 05/31/2020   Lab Results  Component Value Date   TRIG 88 05/31/2020   Lab Results  Component Value Date   CHOLHDL 4.3 05/31/2020   Lab Results  Component Value Date   HGBA1C 5.5 05/31/2020       Assessment & Plan:   Problem List Items Addressed This Visit   None   Visit Diagnoses    Tinea cruris    -  Primary   Relevant Medications   terbinafine (LAMISIL) 1 % cream   nystatin (MYCOSTATIN/NYSTOP) powder  Meds ordered this encounter  Medications  . terbinafine (LAMISIL) 1 % cream    Sig: Apply 1 application topically 2 (two) times  daily.    Dispense:  30 g    Refill:  0    Order Specific Question:   Supervising Provider    Answer:   Carlota Raspberry, JEFFREY R [[id number]]  . nystatin (MYCOSTATIN/NYSTOP) powder    Sig: Apply 1 application topically 3 (three) times daily.    Dispense:  15 g    Refill:  0    Order Specific Question:   Supervising Provider    Answer:   Carlota Raspberry, JEFFREY R [[id number]]   PLAN  Appears as typical tinea infection. Will give antifungals  Discussed nonpharm care and reasons to return  Patient encouraged to call clinic with any questions, comments, or concerns.   Maximiano Coss, NP

## 2020-06-05 NOTE — Patient Instructions (Signed)
° ° ° °  If you have lab work done today you will be contacted with your lab results within the next 2 weeks.  If you have not heard from us then please contact us. The fastest way to get your results is to register for My Chart. ° ° °IF you received an x-ray today, you will receive an invoice from [facility]. Please contact [facility] at [phone] with questions or concerns regarding your invoice.  ° °IF you received labwork today, you will receive an invoice from [facility]. Please contact [facility] at [phone] with questions or concerns regarding your invoice.  ° °Our billing staff will not be able to assist you with questions regarding bills from these companies. ° °You will be contacted with the lab results as soon as they are available. The fastest way to get your results is to activate your My Chart account. Instructions are located on the last page of this paperwork. If you have not heard from us regarding the results in 2 weeks, please contact this office. °  ° ° ° °

## 2020-06-07 NOTE — Telephone Encounter (Signed)
Attempted to call patient, unable to leave message, mailbox is full.  Sent MyChart message.

## 2020-06-10 NOTE — Progress Notes (Signed)
Kindly inform the patient that screening test for diabetes was satisfactory.  Bad cholesterol is elevated and he needs to see his primary care physician to discuss treatment options.

## 2020-06-11 NOTE — Telephone Encounter (Signed)
Kindly see my note from 06/10/2020 about the labs.  Patient needs to see primary care physician for management of cholesterol

## 2020-06-12 ENCOUNTER — Encounter: Payer: Self-pay | Admitting: *Deleted

## 2020-06-12 NOTE — Telephone Encounter (Signed)
Sent patient my chart this morning with Dr Clydene Fake result note.

## 2020-06-27 ENCOUNTER — Other Ambulatory Visit: Payer: Self-pay

## 2020-09-20 ENCOUNTER — Ambulatory Visit: Payer: 59 | Admitting: Neurology

## 2020-10-01 ENCOUNTER — Ambulatory Visit: Payer: 59 | Admitting: Neurology

## 2020-10-01 ENCOUNTER — Encounter: Payer: Self-pay | Admitting: Neurology

## 2020-10-12 ENCOUNTER — Emergency Department (HOSPITAL_COMMUNITY): Payer: 59

## 2020-10-12 ENCOUNTER — Encounter (HOSPITAL_COMMUNITY): Payer: Self-pay

## 2020-10-12 ENCOUNTER — Other Ambulatory Visit: Payer: Self-pay

## 2020-10-12 ENCOUNTER — Emergency Department (HOSPITAL_COMMUNITY)
Admission: EM | Admit: 2020-10-12 | Discharge: 2020-10-12 | Discharge status: Against Medical Advice | Payer: 59 | Attending: Emergency Medicine | Admitting: Emergency Medicine

## 2020-10-12 DIAGNOSIS — R0789 Other chest pain: Secondary | ICD-10-CM | POA: Diagnosis present

## 2020-10-12 DIAGNOSIS — R0602 Shortness of breath: Secondary | ICD-10-CM | POA: Diagnosis not present

## 2020-10-12 DIAGNOSIS — Z5321 Procedure and treatment not carried out due to patient leaving prior to being seen by health care provider: Secondary | ICD-10-CM | POA: Diagnosis not present

## 2020-10-12 LAB — BASIC METABOLIC PANEL
Anion gap: 9 (ref 5–15)
BUN: 16 mg/dL (ref 6–20)
CO2: 22 mmol/L (ref 22–32)
Calcium: 8.9 mg/dL (ref 8.9–10.3)
Chloride: 104 mmol/L (ref 98–111)
Creatinine, Ser: 1.07 mg/dL (ref 0.61–1.24)
GFR, Estimated: >60 mL/min (ref >60)
Glucose, Bld: 136 mg/dL — ABNORMAL HIGH (ref 70–99)
Potassium: 3.5 mmol/L (ref 3.5–5.1)
Sodium: 135 mmol/L (ref 135–145)

## 2020-10-12 LAB — CBC
HCT: 43.4 % (ref 39.0–52.0)
Hemoglobin: 15 g/dL (ref 13.0–17.0)
MCH: 30.4 pg (ref 26.0–34.0)
MCHC: 34.6 g/dL (ref 30.0–36.0)
MCV: 87.9 fL (ref 80.0–100.0)
Platelets: 241 10*3/uL (ref 150–400)
RBC: 4.94 MIL/uL (ref 4.22–5.81)
RDW: 12.1 % (ref 11.5–15.5)
WBC: 8.1 10*3/uL (ref 4.0–10.5)
nRBC: 0 % (ref 0.0–0.2)

## 2020-10-12 LAB — TROPONIN I (HIGH SENSITIVITY)
Troponin I (High Sensitivity): 4 ng/L (ref <18)
Troponin I (High Sensitivity): 3 ng/L (ref <18)

## 2020-10-12 MED ORDER — ASPIRIN 81 MG PO CHEW: 324.0000 mg | CHEWABLE_TABLET | Freq: Once | ORAL | Status: DC

## 2020-10-12 NOTE — ED Notes (Signed)
Patient left without being seen.

## 2020-10-12 NOTE — ED Triage Notes (Signed)
Pt c/o chest pressure x 30 minutes. Pt states he has a hard time taking a deep breath.

## 2020-10-12 NOTE — ED Provider Notes (Signed)
Emergency Medicine Provider Triage Evaluation Note  Todd [name] , a 60 y.o. male  was evaluated in triage.  Pt complains of central chest pressure. Began tonight at rest. Is nonexertional. Waxing and waning and has improved spontaneously a bit. No medications PTA. Denies jaw pain, back pain, abdominal pain, hemoptysis, leg swelling. No known PMH or FHx of ACS. Nonsmoker. Reports he feels anxious.  Review of Systems  Positive: Chest pain/pressure, SOB Negative: Back pain, abdominal pain  Physical Exam  BP (!) 145/98 (BP Location: Right Arm)   Pulse 80   Temp 98 F (36.7 C) (Oral)   Resp 18   Ht '5\' 7"'$  (1.702 m)   Wt 73.9 kg   SpO2 98%   BMI 25.53 kg/m  Gen:   Awake, no distress   Resp:  Normal effort  MSK:   Moves extremities without difficulty  Other:  Heart RRR, lungs CTAB. Anxious appearing.  Medical Decision Making  Medically screening exam initiated at 2:18 AM.  Appropriate orders placed.  Bartolome Durocher was informed that the remainder of the evaluation will be completed by another provider, this initial triage assessment does not replace that evaluation, and the importance of remaining in the ED until their evaluation is complete.  Chest pain   Antonietta Breach, PA-C 10/12/20 [id number]    Margette Fast, MD 10/12/20 [phone]

## 2020-10-22 ENCOUNTER — Encounter: Payer: Self-pay | Admitting: Registered Nurse

## 2020-10-22 ENCOUNTER — Other Ambulatory Visit: Payer: Self-pay

## 2020-10-22 ENCOUNTER — Other Ambulatory Visit: Payer: Self-pay | Admitting: Registered Nurse

## 2020-10-22 ENCOUNTER — Ambulatory Visit: Payer: 59 | Admitting: Registered Nurse

## 2020-10-22 VITALS — BP 130/81 | HR 63 | Temp 98.2°F | Resp 18 | Ht 67.0 in | Wt 169.0 lb

## 2020-10-22 DIAGNOSIS — R079 Chest pain, unspecified: Secondary | ICD-10-CM | POA: Diagnosis not present

## 2020-10-22 DIAGNOSIS — R002 Palpitations: Secondary | ICD-10-CM

## 2020-10-22 DIAGNOSIS — E785 Hyperlipidemia, unspecified: Secondary | ICD-10-CM

## 2020-10-22 LAB — LIPID PANEL
Cholesterol: 238 mg/dL — ABNORMAL HIGH (ref 0–200)
HDL: 46.4 mg/dL (ref >39.00)
LDL Cholesterol: 167 mg/dL — ABNORMAL HIGH (ref 0–99)
NonHDL: 191.62
Total CHOL/HDL Ratio: 5
Triglycerides: 123 mg/dL (ref 0.0–149.0)
VLDL: 24.6 mg/dL (ref 0.0–40.0)

## 2020-10-22 LAB — TSH: TSH: 2.05 u[IU]/mL (ref 0.35–5.50)

## 2020-10-22 LAB — COMPREHENSIVE METABOLIC PANEL
ALT: 18 U/L (ref 0–53)
AST: 16 U/L (ref 0–37)
Albumin: 4.4 g/dL (ref 3.5–5.2)
Alkaline Phosphatase: 50 U/L (ref 39–117)
BUN: 12 mg/dL (ref 6–23)
CO2: 27 mEq/L (ref 19–32)
Calcium: 9.3 mg/dL (ref 8.4–10.5)
Chloride: 103 mEq/L (ref 96–112)
Creatinine, Ser: 0.96 mg/dL (ref 0.40–1.50)
GFR: 87.14 mL/min (ref >60.00)
Glucose, Bld: 76 mg/dL (ref 70–99)
Potassium: 4.5 mEq/L (ref 3.5–5.1)
Sodium: 137 mEq/L (ref 135–145)
Total Bilirubin: 1.4 mg/dL — ABNORMAL HIGH (ref 0.2–1.2)
Total Protein: 7 g/dL (ref 6.0–8.3)

## 2020-10-22 LAB — HEMOGLOBIN A1C: Hgb A1c MFr Bld: 5.6 % (ref 4.6–6.5)

## 2020-10-22 MED ORDER — ROSUVASTATIN CALCIUM 5 MG PO TABS: 5.0000 mg | ORAL_TABLET | Freq: Every day | ORAL | 3 refills | Status: DC

## 2020-10-22 NOTE — Patient Instructions (Addendum)
Todd [name] -   Doristine Devoid to see you. Glad the Er visit showed a healthy heart.   We can get you back in to see Dr. Debara Pickett to confirm this.  Focus on healthy diet and staying active. My recommendations: Diet: eat real food (less processed foods), control portion sizes, mostly plants (fruits, veggies, beans, legumes, etc). Exercise: 30-60 minutes of exercise at least 4-5 days each week. This can be walking, running, yoga, weights, etc.  Last labs showed cholesterol that was borderline. If it's still high today we may want to consider a medication. I'll be in touch with lab results.   Thank you  Rich     If you have lab work done today you will be contacted with your lab results within the next 2 weeks.  If you have not heard from [place] then please contact us. The fastest way to get your results is to register for My Chart.   IF you received an x-ray today, you will receive an invoice from [facility]. Please contact [facility] at [phone] with questions or concerns regarding your invoice.   IF you received labwork today, you will receive an invoice from [facility]. Please contact [facility] at [phone] with questions or concerns regarding your invoice.   Our billing staff will not be able to assist you with questions regarding bills from these companies.  You will be contacted with the lab results as soon as they are available. The fastest way to get your results is to activate your My Chart account. Instructions are located on the last page of this paperwork. If you have not heard from [place] regarding the results in 2 weeks, please contact this office.

## 2020-10-22 NOTE — Progress Notes (Signed)
Established Patient Office Visit  Subjective:  Patient ID: Todd [name], male    DOB: [birth year]  Age: 60 y.o. MRN: [id number]  CC:  Chief Complaint  Patient presents with   Referral    Patient states he would like to get a referral to an cardiologist due to flutters in his chest but he has not experienced any pain. It has been happening for about 8 years.    HPI Todd [name] presents for flutters in chest On and off for 8 years Has not seen specialist for this in some time, had seen Dr. Debara Pickett in past.  Chart review shows echo and stress test in 2018 - Stress test wnl, no evidence of ischemia, EF 62%  He does have remote hx of TIA in 2017 Previous MRI/MRA brain in 2015 showed possible ischemic changes  Does have strong fam hx of htn  Recent ER visit on 10/12/20 for central chest pressure Ekg wnl, negative troponin x 2, Bmp shows glucose to 136 but otherwise wnl, CBC wnl Dc'd and asked to follow up with primary care.  No new symptoms since Notes he has been on a monitor in the past, unsure of results Notes he has had friends pass from heart disease. He is worried.  Past Medical History:  Diagnosis Date   Complicated migraine    Diverticulosis    External hemorrhoids    External hemorrhoids    Headache    Hx of cardiovascular stress test    Myoview 8/10:  no ischemia, EF 62%; low risk   Hx of Doppler ultrasound    Carotid US 6/15:  bilat ICA 1-39%   Hx of echocardiogram    echo 6/15:  EF 60-65%, no RWMA   Internal hemorrhoids    Palpitations    Stroke (HCC)    TIA (transient ischemic attack)    Tubular adenoma of colon    Vision abnormalities     Past Surgical History:  Procedure Laterality Date   APPENDECTOMY  12/01/11   lap appy   LAPAROSCOPIC APPENDECTOMY  12/02/2011   Procedure: APPENDECTOMY LAPAROSCOPIC;  Surgeon: Madilyn Hook, DO;  Location: [facility];  Service: General;  Laterality: N/A;    Family History  Problem Relation Age of Onset   Hypertension  Mother    Hypertension Father    Anemia Neg Hx    Arrhythmia Neg Hx    Asthma Neg Hx    Clotting disorder Neg Hx    Fainting Neg Hx    Heart attack Neg Hx    Heart disease Neg Hx    Heart failure Neg Hx    Hyperlipidemia Neg Hx    Colon cancer Neg Hx    Esophageal cancer Neg Hx    Stomach cancer Neg Hx    Rectal cancer Neg Hx     Social History   Socioeconomic History   Marital status: Divorced    Spouse name: Not on file   Number of children: Not on file   Years of education: Not on file   Highest education level: Not on file  Occupational History   Occupation: part time  Tobacco Use   Smoking status: Never   Smokeless tobacco: Never  Vaping Use   Vaping Use: Never used  Substance and Sexual Activity   Alcohol use: No   Drug use: No   Sexual activity: Not on file  Other Topics Concern   Not on file  Social History Narrative   Lives alone   Right  Handed   Drinks very little caffeine   Social Determinants of Radio broadcast assistant Strain: Not on file  Food Insecurity: Not on file  Transportation Needs: Not on file  Physical Activity: Not on file  Stress: Not on file  Social Connections: Not on file  Intimate Partner Violence: Not on file    Outpatient Medications Prior to Visit  Medication Sig Dispense Refill   ibuprofen (ADVIL) 600 MG tablet Take 1 tablet (600 mg total) by mouth every 6 (six) hours as needed. 30 tablet 0   aspirin EC 81 MG tablet Take 1 tablet (81 mg total) by mouth daily. Swallow whole. (Patient not taking: No sig reported) 30 tablet 11   nystatin (MYCOSTATIN/NYSTOP) powder Apply 1 application topically 3 (three) times daily. (Patient not taking: Reported on 10/22/2020) 15 g 0   terbinafine (LAMISIL) 1 % cream Apply 1 application topically 2 (two) times daily. (Patient not taking: Reported on 10/22/2020) 30 g 0   No facility-administered medications prior to visit.    Allergies  Allergen Reactions   Wellbutrin [Bupropion Hcl]  Itching and Rash    ROS Review of Systems  Constitutional: Negative.   HENT: Negative.    Eyes: Negative.   Respiratory: Negative.    Cardiovascular:  Positive for palpitations. Negative for chest pain and leg swelling.  Gastrointestinal: Negative.   Genitourinary: Negative.   Musculoskeletal: Negative.   Skin: Negative.   Neurological: Negative.   Psychiatric/Behavioral: Negative.    All other systems reviewed and are negative.    Objective:    Physical Exam Constitutional:      General: He is not in acute distress.    Appearance: Normal appearance. He is normal weight. He is not ill-appearing, toxic-appearing or diaphoretic.  Cardiovascular:     Rate and Rhythm: Normal rate and regular rhythm.     Heart sounds: Normal heart sounds. No murmur heard.   No friction rub. No gallop.  Pulmonary:     Effort: Pulmonary effort is normal. No respiratory distress.     Breath sounds: Normal breath sounds. No stridor. No wheezing, rhonchi or rales.  Chest:     Chest wall: No tenderness.  Neurological:     General: No focal deficit present.     Mental Status: He is alert and oriented to person, place, and time. Mental status is at baseline.  Psychiatric:        Mood and Affect: Mood normal.        Behavior: Behavior normal.        Thought Content: Thought content normal.        Judgment: Judgment normal.    BP 130/81   Pulse 63   Temp 98.2 F (36.8 C) (Temporal)   Resp 18   Ht '5\' 7"'$  (1.702 m)   Wt 169 lb (76.7 kg)   SpO2 99%   BMI 26.47 kg/m  Wt Readings from Last 3 Encounters:  10/22/20 169 lb (76.7 kg)  10/12/20 163 lb (73.9 kg)  06/05/20 163 lb 9.6 oz (74.2 kg)     Health Maintenance Due  Topic Date Due   INFLUENZA VACCINE  10/22/2020    There are no preventive care reminders to display for this patient.  Lab Results  Component Value Date   TSH 3.570 09/19/2019   Lab Results  Component Value Date   WBC 8.1 10/12/2020   HGB 15.0 10/12/2020   HCT 43.4  10/12/2020   MCV 87.9 10/12/2020   PLT 241 10/12/2020  Lab Results  Component Value Date   NA 135 10/12/2020   K 3.5 10/12/2020   CO2 22 10/12/2020   GLUCOSE 136 (H) 10/12/2020   BUN 16 10/12/2020   CREATININE 1.07 10/12/2020   BILITOT 1.5 (H) 03/22/2020   ALKPHOS 55 03/22/2020   AST 21 03/22/2020   ALT 24 03/22/2020   PROT 7.2 03/22/2020   ALBUMIN 4.1 03/22/2020   CALCIUM 8.9 10/12/2020   ANIONGAP 9 10/12/2020   GFR 82.38 07/12/2014   Lab Results  Component Value Date   CHOL 231 (H) 05/31/2020   Lab Results  Component Value Date   HDL 54 05/31/2020   Lab Results  Component Value Date   LDLCALC 162 (H) 05/31/2020   Lab Results  Component Value Date   TRIG 88 05/31/2020   Lab Results  Component Value Date   CHOLHDL 4.3 05/31/2020   Lab Results  Component Value Date   HGBA1C 5.5 05/31/2020      Assessment & Plan:   Problem List Items Addressed This Visit       Other   Chest pain   Relevant Orders   Comprehensive metabolic panel   Lipid panel   TSH   Hemoglobin A1c   Other Visit Diagnoses     Palpitations    -  Primary   Relevant Orders   Comprehensive metabolic panel   Lipid panel   TSH   Hemoglobin A1c       No orders of the defined types were placed in this encounter.   Follow-up: Return in about 6 months (around 04/24/2021) for CPE and labs with Carlota Raspberry.   PLAN Unremarkable exam today Labs collected. Will follow up with the patient as warranted. Refer to Dr. Debara Pickett. Should rule out cardiac etiology given his symptoms and family history but strong consideration for anxiety as etiology. Patient encouraged to call clinic with any questions, comments, or concerns.   Maximiano Coss, NP

## 2020-10-22 NOTE — Addendum Note (Signed)
Addended by: Maximiano Coss on: 10/22/2020 04:50 PM   Modules accepted: Orders

## 2020-12-07 IMAGING — CR CHEST - 2 VIEW
2 series · 2 of 2 positions shown · non-contrast
Comparison: 02/16/2018

CLINICAL DATA: Shortness of breath

EXAM:
CHEST - 2 VIEW

[chest pa]
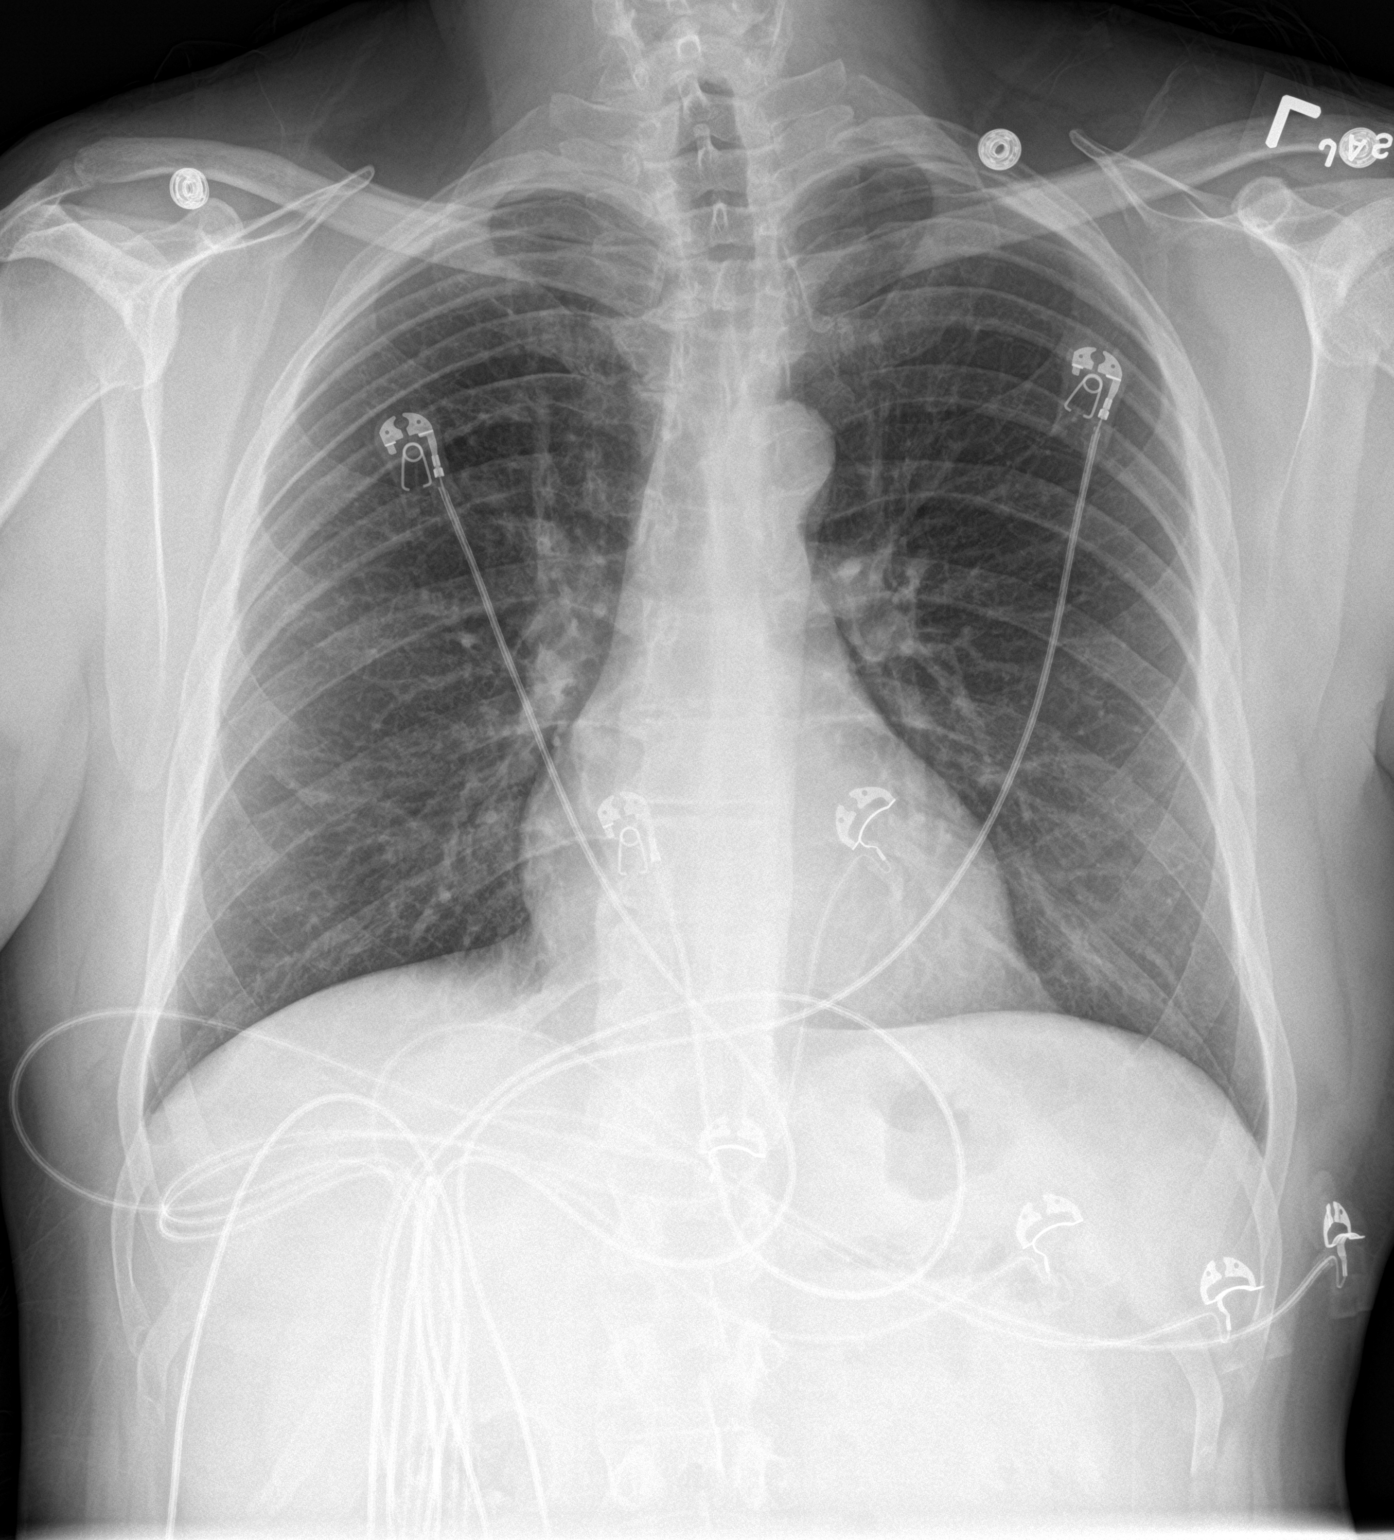

[chest lat]
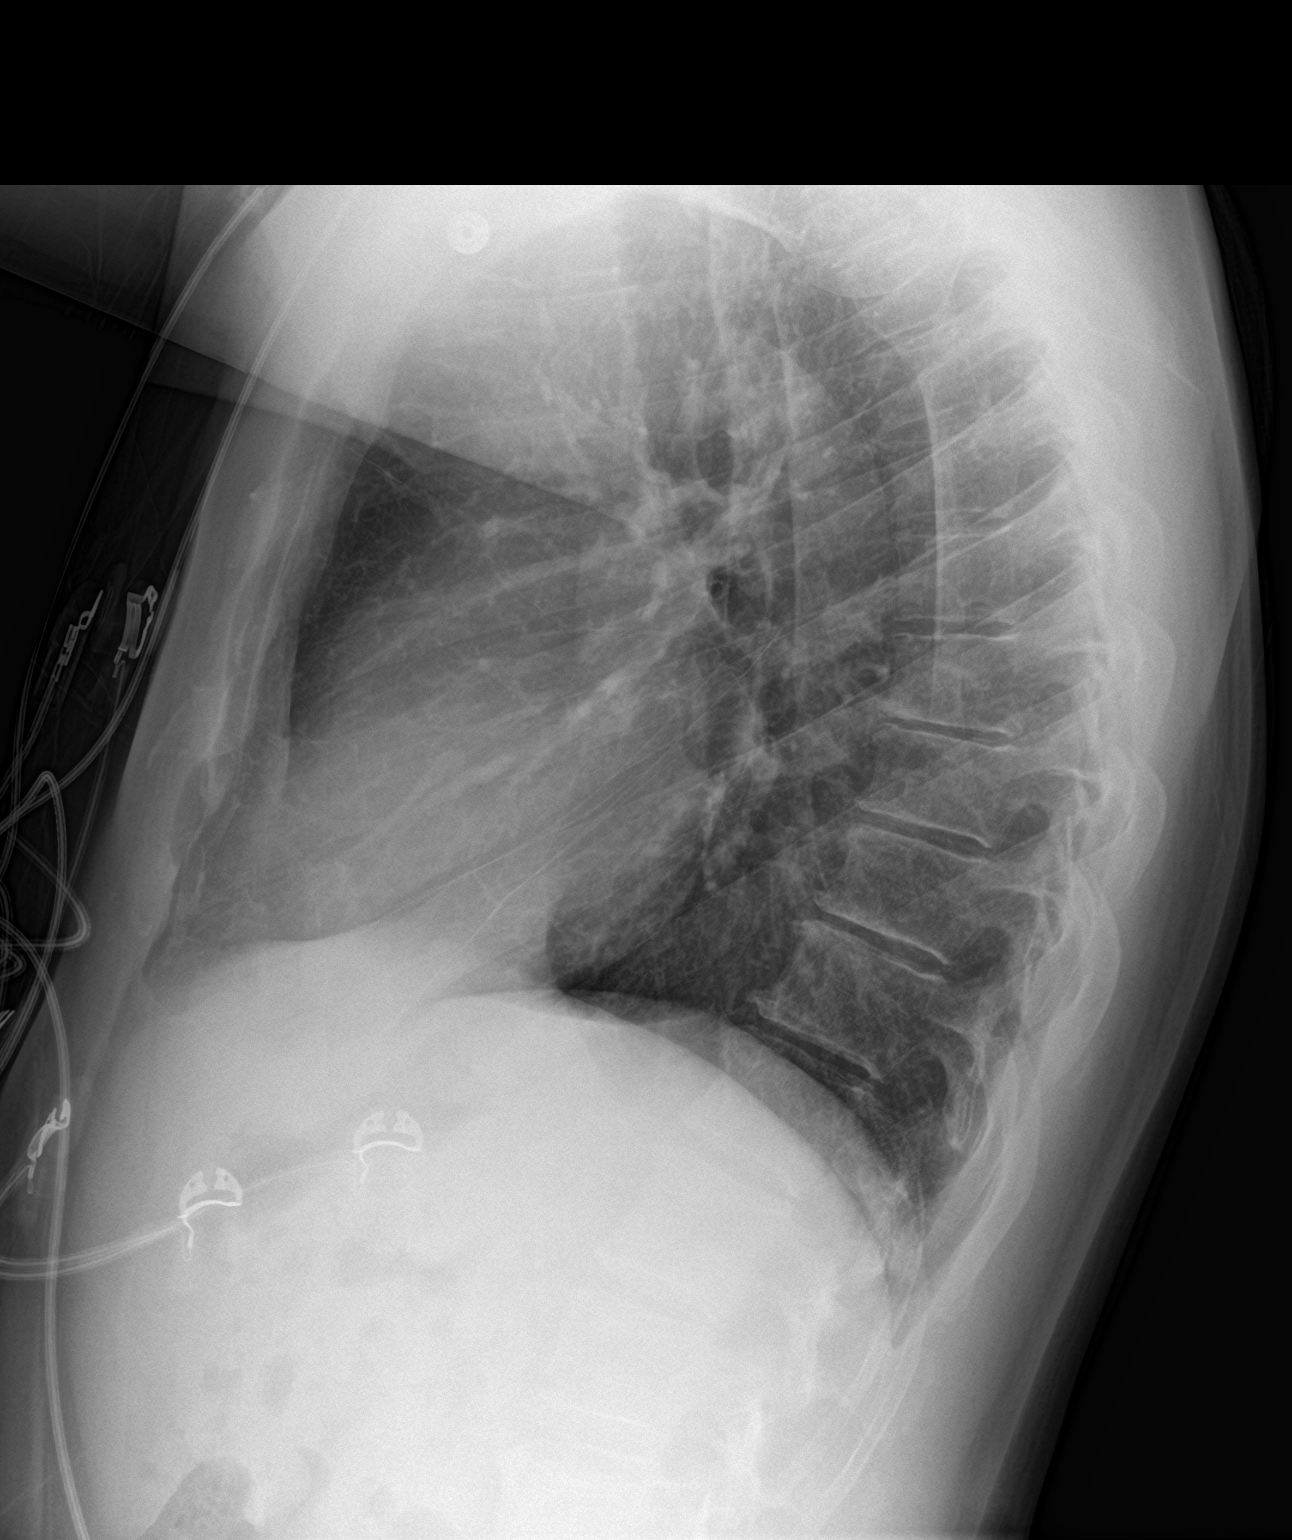

[2 of 2 positions shown; findings below may reference images not displayed]

FINDINGS: The heart size and mediastinal contours are within normal limits.
Both lungs are clear. The visualized skeletal structures are
unremarkable.
IMPRESSION: No active cardiopulmonary disease.

## 2020-12-11 ENCOUNTER — Other Ambulatory Visit: Payer: Self-pay

## 2020-12-11 ENCOUNTER — Telehealth: Payer: Self-pay

## 2020-12-11 DIAGNOSIS — B356 Tinea cruris: Secondary | ICD-10-CM

## 2020-12-11 MED ORDER — HYDROCORT-PRAMOXINE (PERIANAL) 2.5-1 % EX CREA: 1.0000 "application " | TOPICAL_CREAM | Freq: Three times a day (TID) | CUTANEOUS | 0 refills | Status: DC

## 2020-12-11 MED ORDER — TERBINAFINE HCL 1 % EX CREA: 1.0000 "application " | TOPICAL_CREAM | Freq: Two times a day (BID) | CUTANEOUS | 0 refills | Status: DC

## 2020-12-11 NOTE — Telephone Encounter (Signed)
Pt needs refill on terbinafine (LAMISIL) 1 % cream   [id number] [address], [place] - [address], [place] [id number]    Past appt 08/22 Future appt 02/23

## 2020-12-11 NOTE — Telephone Encounter (Signed)
Called and discussed with pt as marked he was not using last OV he needed the annusol cream and not this a this is for rash and he needed hemorrhoid treatment

## 2021-01-04 ENCOUNTER — Ambulatory Visit (INDEPENDENT_AMBULATORY_CARE_PROVIDER_SITE_OTHER): Payer: 59 | Admitting: Internal Medicine

## 2021-01-04 ENCOUNTER — Ambulatory Visit: Payer: 59 | Admitting: Registered Nurse

## 2021-01-04 ENCOUNTER — Other Ambulatory Visit: Payer: Self-pay

## 2021-01-04 ENCOUNTER — Encounter: Payer: Self-pay | Admitting: Internal Medicine

## 2021-01-04 VITALS — BP 118/78 | HR 63 | Ht 67.0 in | Wt 173.0 lb

## 2021-01-04 DIAGNOSIS — E785 Hyperlipidemia, unspecified: Secondary | ICD-10-CM | POA: Diagnosis not present

## 2021-01-04 DIAGNOSIS — R0609 Other forms of dyspnea: Secondary | ICD-10-CM | POA: Diagnosis not present

## 2021-01-04 DIAGNOSIS — R002 Palpitations: Secondary | ICD-10-CM

## 2021-01-04 MED ORDER — METOPROLOL TARTRATE 50 MG PO TABS: ORAL_TABLET | ORAL | 0 refills | Status: DC

## 2021-01-04 NOTE — Progress Notes (Signed)
OFFICE NOTE  Chief Complaint:  DOE, palpitations  Primary Care Physician: Wendie Agreste, MD  HPI:  Todd [name] is a 60 year old male who was previously seen at [facility] heart [facility] vascular by Dr. Elisabeth Cara. He underwent a stress test, echocardiogram and monitor which were unrevealing. He was told he was well and did [name] need to follow back up with [place]. He recently had an episode where he had some visual abnormalities including seeing white spots. He presented to the emergency room and there was concern for TIA or stroke. Workup was generally unremarkable except for some cerebral atherosclerosis. It was also considered that he might have had a migraine although he had no headache and has no history of headaches. He currently denies any chest pain although does get some mild shortness of breath with exertion. 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 He does have a family history of heart disease in his father who apparently had an enlarged heart and fluid around the heart. He also has a history of anxiety and was briefly a medication for anxiety and depression in the past. He takes Lipitor for elevated LDL cholesterol recently which was 130.  I saw Todd [name] back in the office today. He denies any worsening palpitations. He was in the emergency room apparently this morning with blurred vision and seeing spots. He also had a headache on the right side of his head. He left apparently before being fully evaluated. He's had episodes like this before. There is question is whether this could be complicated migraine or TIA. He denies any chest pain or worsening shortness of breath.  11/12/2016  Todd [name] was seen today in follow-up. He was recently seen in the ER for chest discomfort. He says he occasionally gets some palpitations which are short-lived and chest discomfort. He's also been having visual changes. That is typically  followed by headache. He underwent neurology evaluation in the past for this as previously described in my notes. MRI showed some cerebral atherosclerosis but was felt to have no evidence of stroke. It was thought that his symptoms are related to migraine. He reports chronic stress and running his own business. She's [name] currently on medications. He was prescribed some medications for migraines ever since his symptoms improved he stopped taking them and did [name] follow-up with the neurologist. With regard to the chest pain it is mostly sharp, [name] necessarily associated with exertion or relieved by rest. Sometimes associated with palpitations.   01/04/2021  Todd [name] is seen today in follow-up.  He is considered a new patient today since he has [name] been seen in the past 3 years.  He is again having some palpitations.  They occur fairly sporadically but sometimes can be more intense.  We talked a lot about his diet.  He said he does [name] drink often but he drinks green tea.  There is between 30 to 50 mg of caffeine and every cup of green tea and therefore if he could cut that back or switch to decaffeinated it may be helpful.  He may ultimately need low-dose beta-blocker.  He is also been experiencing shortness of breath per tickly walking upstairs.  This is worsened recently.  He has no history of coronary disease although lipids in August showed significant elevation.  He says his diet is pretty unregulated, meaning he eats what ever he can.  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  He recently started on 5 mg rosuvastatin for this.  Total cholesterol was 238, HDL 46, triglycerides 123 and LDL 167.  PMHx:  Past Medical History:  Diagnosis Date   Complicated migraine    Diverticulosis    External hemorrhoids    External hemorrhoids    Headache    Hx of cardiovascular stress test    Myoview 8/10:  no ischemia, EF 62%; low risk   Hx of Doppler ultrasound    Carotid US  6/15:  bilat ICA 1-39%   Hx of echocardiogram    echo 6/15:  EF 60-65%, no RWMA   Internal hemorrhoids    Palpitations    Stroke (HCC)    TIA (transient ischemic attack)    Tubular adenoma of colon    Vision abnormalities     Past Surgical History:  Procedure Laterality Date   APPENDECTOMY  12/01/11   lap appy   LAPAROSCOPIC APPENDECTOMY  12/02/2011   Procedure: APPENDECTOMY LAPAROSCOPIC;  Surgeon: Madilyn Hook, DO;  Location: [facility];  Service: General;  Laterality: N/A;    FAMHx:  Family History  Problem Relation Age of Onset   Hypertension Mother    Hypertension Father    Anemia Neg Hx    Arrhythmia Neg Hx    Asthma Neg Hx    Clotting disorder Neg Hx    Fainting Neg Hx    Heart attack Neg Hx    Heart disease Neg Hx    Heart failure Neg Hx    Hyperlipidemia Neg Hx    Colon cancer Neg Hx    Esophageal cancer Neg Hx    Stomach cancer Neg Hx    Rectal cancer Neg Hx     SOCHx:   reports that he has never smoked. He has never used smokeless tobacco. He reports that he does [name] drink alcohol and does [name] use drugs.  ALLERGIES:  Allergies  Allergen Reactions   Wellbutrin [Bupropion Hcl] Itching and Rash    ROS: Pertinent items noted in HPI and remainder of comprehensive ROS otherwise negative.  HOME MEDS: Current Outpatient Medications  Medication Sig Dispense Refill   ibuprofen (ADVIL) 600 MG tablet Take 1 tablet (600 mg total) by mouth every 6 (six) hours as needed. 30 tablet 0   rosuvastatin (CRESTOR) 5 MG tablet Take 1 tablet (5 mg total) by mouth daily. 90 tablet 3   aspirin EC 81 MG tablet Take 1 tablet (81 mg total) by mouth daily. Swallow whole. (Patient [name] taking: No sig reported) 30 tablet 11   hydrocortisone-pramoxine (ANALPRAM HC) 2.5-1 % rectal cream Place 1 application rectally 3 (three) times daily. (Patient [name] taking: Reported on 01/04/2021) 30 g 0   nystatin (MYCOSTATIN/NYSTOP) powder Apply 1 application topically 3 (three) times daily. (Patient [name]  taking: No sig reported) 15 g 0   terbinafine (LAMISIL) 1 % cream Apply 1 application topically 2 (two) times daily. 30 g 0   No current facility-administered medications for this visit.    LABS/IMAGING: No results found for this or any previous visit (from the past 48 hour(s)). No results found.  VITALS: BP 118/78 (BP Location: Left Arm, Patient Position: Sitting, Cuff Size: Normal)   Pulse 63   Ht 5\' 7"  (1.702 m)   Wt 173 lb (78.5 kg)   SpO2 96%   BMI 27.10 kg/m   EXAM: General appearance: alert and no distress Neck: no carotid bruit and no JVD Lungs: clear  to auscultation bilaterally Heart: regular rate and rhythm, S1, S2 normal, no murmur, click, rub or gallop Abdomen: soft, non-tender; bowel sounds normal; no masses,  no organomegaly Extremities: extremities normal, atraumatic, no cyanosis or edema Pulses: 2+ and symmetric Skin: Skin color, texture, turgor normal. No rashes or lesions Neurologic: Grossly normal Psych: Mildly anxious  EKG: Normal sinus rhythm at 63-personally reviewed  ASSESSMENT: DOE Palpitations Visual changes with headache - suspect migraine Mild dyspnea with exertion Atherosclerosis/dyslipidemia Anxiety  PLAN: 1.   Todd [name] has had progressive dyspnea on exertion particularly walking upstairs.  He denies any true chest pain.  He is also had more palpitations.  This is a longstanding issue for years.  He does drink a lot of green tea which contains caffeine.  He was [name] aware of that.  I advised him to try to reduce that or switch to decaffeinated green tea.  He will try that first.  I think this could help with his palpitations.  We will go ahead and get a coronary CT angiogram although I feel like he is at low risk except he does have elevated cholesterol and history of atherosclerosis.  No significant family history of early onset heart disease.  Thanks for referring him back for evaluation.  We will plan a 27-month follow-up unless his studies  are abnormal then he may be seen sooner  Pixie Casino, MD, [facility], [place] Director of the [facility] Lipid Disorders &  Cardiovascular Risk Reduction [facility] Diplomate of the American Board of Clinical Lipidology Attending Cardiologist  Direct Dial: [phone]  Fax: [phone]  Website:  [email or web address] 01/04/2021, 9:09 AM

## 2021-01-04 NOTE — Patient Instructions (Signed)
Medication Instructions:  NO CHANGES  *If you need a refill on your cardiac medications before your next appointment, please call your pharmacy*   Lab Work: FASTING lab work to check cholesterol, BMET -- complete a few days before your CT test at Dr. Lysbeth Penner office or another [employer]   If you have labs (blood work) drawn today and your tests are completely normal, you will receive your results only by: [employer] (if you have MyChart) OR A paper copy in the mail If you have any lab test that is abnormal or we need to change your treatment, we will call you to review the results.   Testing/Procedures: Cardiac CT at [facility] Once approved with insurance, you will get a call to arrange your test appointment   Follow-Up: At [facility], you and your health needs are our priority.  As part of our continuing mission to provide you with exceptional heart care, we have created designated Provider Care Teams.  These Care Teams include your primary Cardiologist (physician) and Advanced Practice Providers (APPs -  Physician Assistants and Nurse Practitioners) who all work together to provide you with the care you need, when you need it.  We recommend signing up for the patient portal called "MyChart".  Sign up information is provided on this After Visit Summary.  MyChart is used to connect with patients for Virtual Visits (Telemedicine).  Patients are able to view lab/test results, encounter notes, upcoming appointments, etc.  Non-urgent messages can be sent to your provider as well.   To learn more about what you can do with MyChart, go to [email or web address].    Your next appointment:   6 month(s)  The format for your next appointment:   In Person  Provider:   You may see Dr. Debara Pickett or one of the following Advanced Practice Providers on your designated Care Team:   Almyra Deforest, PA-C Fabian Sharp, [place] or  Roby Lofts, [place]   Other Instructions   Please  decrease green tea consumption and/or switch to decaffeinated tea     Your cardiac CT will be scheduled at one of the below locations:   [facility] [address] [place], [place] [address] [phone]  [place] [address] [place], [place] [address] [phone]  If scheduled at [facility], please arrive at the [facility] main entrance (entrance A) of [facility] 30 minutes prior to test start time. Proceed to the [facility] Radiology Department (first floor) to check-in and test prep.  If scheduled at [facility], please arrive 15 mins early for check-in and test prep.  Please follow these instructions carefully (unless otherwise directed):  Hold all erectile dysfunction medications at least 3 days (72 hrs) prior to test.  On the Night Before the Test: Be sure to Drink plenty of water. Do not consume any caffeinated/decaffeinated beverages or chocolate 12 hours prior to your test. Do not take any antihistamines 12 hours prior to your test. If the patient has contrast allergy: Patient will need a prescription for Prednisone and very clear instructions (as follows): Prednisone 50 mg - take 13 hours prior to test Take another Prednisone 50 mg 7 hours prior to test Take another Prednisone 50 mg 1 hour prior to test Take Benadryl 50 mg 1 hour prior to test Patient must complete all four doses of above prophylactic medications. Patient will need a ride after test due to Benadryl.  On the Day of the Test: Drink plenty of water until 1 hour prior to the test. Do not eat any food 4 hours prior to the test. You may take your regular medications prior to the test.  Take metoprolol (Lopressor) two hours prior to test. HOLD Furosemide/Hydrochlorothiazide morning of the test.  After the Test: Drink plenty of water. After receiving IV contrast, you may experience a  mild flushed feeling. This is normal. On occasion, you may experience a mild rash up to 24 hours after the test. This is not dangerous. If this occurs, you can take Benadryl 25 mg and increase your fluid intake. If you experience trouble breathing, this can be serious. If it is severe call 911 IMMEDIATELY. If it is mild, please call our office. If you take any of these medications: Glipizide/Metformin, Avandament, Glucavance, please do not take 48 hours after completing test unless otherwise instructed.  Please allow 2-4 weeks for scheduling of routine cardiac CTs. Some insurance companies require a pre-authorization which may delay scheduling of this test.   For non-scheduling related questions, please contact the cardiac imaging nurse navigator should you have any questions/concerns: Marchia Bond, Cardiac Imaging Nurse Navigator Gordy Clement, Cardiac Imaging Nurse Navigator [facility] Direct Office Dial: [phone]   For scheduling needs, including cancellations and rescheduling, please call [place], [phone].

## 2021-01-17 ENCOUNTER — Ambulatory Visit (HOSPITAL_COMMUNITY): Admission: RE | Admit: 2021-01-17 | Payer: 59 | Source: Ambulatory Visit

## 2021-01-22 ENCOUNTER — Telehealth (HOSPITAL_COMMUNITY): Payer: Self-pay | Admitting: Emergency Medicine

## 2021-01-22 NOTE — Telephone Encounter (Signed)
Unable to leave vm Milledge Gerding RN Navigator Cardiac Imaging [facility] [phone] Office  [phone] Cell  

## 2021-01-24 ENCOUNTER — Ambulatory Visit (HOSPITAL_COMMUNITY): Admission: RE | Admit: 2021-01-24 | Payer: 59 | Source: Ambulatory Visit

## 2021-03-06 ENCOUNTER — Telehealth (HOSPITAL_COMMUNITY): Payer: Self-pay | Admitting: *Deleted

## 2021-03-06 NOTE — Telephone Encounter (Signed)
Attempted to call patient regarding upcoming cardiac CT appointment. Voicemail not set up to leave a message.  Clarkson Rosselli RN Navigator Cardiac Imaging [facility] [phone] Office [phone] Cell  

## 2021-03-07 ENCOUNTER — Ambulatory Visit (HOSPITAL_COMMUNITY): Admission: RE | Admit: 2021-03-07 | Payer: 59 | Source: Ambulatory Visit

## 2021-04-24 ENCOUNTER — Ambulatory Visit: Payer: 59 | Admitting: Family Medicine

## 2021-05-13 ENCOUNTER — Ambulatory Visit: Payer: 59 | Admitting: Family Medicine

## 2021-05-13 ENCOUNTER — Encounter: Payer: Self-pay | Admitting: Family Medicine

## 2021-05-13 VITALS — BP 124/78 | HR 68 | Temp 97.9°F | Resp 16 | Ht 67.0 in | Wt 177.0 lb

## 2021-05-13 DIAGNOSIS — R251 Tremor, unspecified: Secondary | ICD-10-CM

## 2021-05-13 NOTE — Progress Notes (Signed)
Subjective:  Patient ID: Todd [name], male    DOB: [birth year]  Age: 61 y.o. MRN: [id number]  CC:  Chief Complaint  Patient presents with   Hand Problem    Pt reports notes shakiness in his hands for several months, has been intermittent, notes some weakness in his grip at times     HPI Todd [name] presents for   Hand shakiness Past 9 months. Both sides, worse on right. Some at rest, but notes more with intention. No leg symptoms.  No dropped items, but feels like hands less strong. Past few months. No injury. No neck pain, no headache.  No LE weakness or instability. No treatments. Comes and goes.  Caffeine: 2 16oz tea per day - about same amount. Nothing makes better/worse.  Sleeping ok, 6 hrs or less per night, no new stressors or anxiety.   He was evaluated over a year ago with relapsing remitting photosensitivity, headache, left arm numbness with 2 separate CTs of his head without acute findings,  those sx's have resolved. 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/24/2013   TIA (transient ischemic attack) 08/23/2013   Blurred vision 08/23/2013   Anomia 08/23/2013   Past Medical History:  Diagnosis Date   Complicated migraine    Diverticulosis    External hemorrhoids    External hemorrhoids    Headache    Hx of cardiovascular stress test    Myoview 8/10:  no ischemia, EF 62%; low risk   Hx of Doppler ultrasound    Carotid US 6/15:  bilat ICA 1-39%   Hx of echocardiogram    echo 6/15:  EF 60-65%, no RWMA   Internal hemorrhoids    Palpitations    Stroke (HCC)    TIA (transient ischemic attack)    Tubular adenoma of colon    Vision abnormalities    Past Surgical History:  Procedure Laterality Date   APPENDECTOMY  12/01/11   lap appy   LAPAROSCOPIC APPENDECTOMY   12/02/2011   Procedure: APPENDECTOMY LAPAROSCOPIC;  Surgeon: Madilyn Hook, DO;  Location: [facility];  Service: General;  Laterality: N/A;   Allergies  Allergen Reactions   Wellbutrin [Bupropion Hcl] Itching and Rash   Prior to Admission medications   Medication Sig Start Date End Date Taking? Authorizing Provider  terbinafine (LAMISIL) 1 % cream Apply 1 application topically 2 (two) times daily. 12/11/20  Yes Wendie Agreste, MD  aspirin EC 81 MG tablet Take 1 tablet (81 mg total) by mouth daily. Swallow whole. Patient not taking: Reported on 06/05/2020 05/31/20   Garvin Fila, MD  hydrocortisone-pramoxine [facility]) 2.5-1 % rectal cream Place 1 application rectally 3 (three) times daily. Patient not taking: Reported on 01/04/2021 12/11/20   Wendie Agreste, MD  ibuprofen (ADVIL) 600 MG tablet Take 1 tablet (600 mg total) by mouth every 6 (six) hours as needed. Patient not taking: Reported on 05/13/2021 04/09/20   Melynda Ripple, MD  metoprolol tartrate (LOPRESSOR) 50 MG tablet Take 1 tablet by mouth 2 hours prior to CT test Patient not taking: Reported on 05/13/2021 01/04/21   Pixie Casino, MD  nystatin (MYCOSTATIN/NYSTOP) powder Apply 1 application topically 3 (three) times daily. Patient not taking: Reported on 10/22/2020 06/05/20   Maximiano Coss, NP  rosuvastatin (CRESTOR) 5  MG tablet Take 1 tablet (5 mg total) by mouth daily. Patient not taking: Reported on 05/13/2021 10/22/20   Maximiano Coss, NP   Social History   Socioeconomic History   Marital status: Divorced    Spouse name: Not on file   Number of children: Not on file   Years of education: Not on file   Highest education level: Not on file  Occupational History   Occupation: part time  Tobacco Use   Smoking status: Never   Smokeless tobacco: Never  Vaping Use   Vaping Use: Never used  Substance and Sexual Activity   Alcohol use: No   Drug use: No   Sexual activity: Not on file  Other Topics Concern   Not on  file  Social History Narrative   Lives alone   Right Handed   Drinks very little caffeine   Social Determinants of Health   Financial Resource Strain: Not on file  Food Insecurity: Not on file  Transportation Needs: Not on file  Physical Activity: Not on file  Stress: Not on file  Social Connections: Not on file  Intimate Partner Violence: Not on file    Review of Systems Per HPI.   Objective:   Vitals:   05/13/21 0810  BP: 124/78  Pulse: 68  Resp: 16  Temp: 97.9 F (36.6 C)  TempSrc: Temporal  SpO2: 96%  Weight: 177 lb (80.3 kg)  Height: 5\' 7"  (1.702 m)     Physical Exam Vitals reviewed.  Constitutional:      General: He is not in acute distress.    Appearance: Normal appearance. He is well-developed.  HENT:     Head: Normocephalic and atraumatic.  Cardiovascular:     Rate and Rhythm: Normal rate.  Pulmonary:     Effort: Pulmonary effort is normal.  Musculoskeletal:     Comments: Cervical spine, no midline bony tenderness, decreased extension, slight decreased rotation/lateral flexion but no pain or radicular symptoms.  No change in hand symptoms.  Bilateral hands with normal cap refill, sensation intact distally.  Strength was grossly intact and equal bilaterally including with grip strength, finger opposition.  Negative Tinel, negative Phalen.  Neurological:     General: No focal deficit present.     Mental Status: He is alert and oriented to person, place, and time.     Comments: During initial history very faint tremor noted at right hand at rest, but on exam no visible tremor of hands.  Normal finger-to-nose.  Normal gait without shuffling, appears to have normal movements.  Psychiatric:        Mood and Affect: Mood normal.       Assessment & Plan:  Todd [name] is a 61 y.o. male . Tremor of both hands - Plan: Ambulatory referral to Neurology Intermittent symptoms.  Initially during history faint tremor of right hand at rest but not seen during  exam or able to reproduce on exam.  Subjective weakness but I did not appreciate any focal weakness on exam.  No neck or head symptoms.  Otherwise reassuring neurologic exam.  -Recommended to decrease caffeine slightly as well as increase sleep to see if that made an impact.  We will also refer to neurology to decide on further work-up and testing.  RTC/ER precautions if worsening.  9-month follow-up for physical, labs and med review.  No orders of the defined types were placed in this encounter.  Patient Instructions  Try to cut back slightly on caffeine and try to  increase sleep by an hour initially to see if that helps with the tremor.  I will refer you to neurology to discuss the symptoms and evaluate further.  Let me know if there are questions.Return to the clinic or go to the nearest emergency room if any of your symptoms worsen or new symptoms occur.       Signed,   Merri Ray, MD [place], [facility] 05/13/21 8:59 AM

## 2021-05-13 NOTE — Patient Instructions (Signed)
Try to cut back slightly on caffeine and try to increase sleep by an hour initially to see if that helps with the tremor.  I will refer you to neurology to discuss the symptoms and evaluate further.  Let me know if there are questions.Return to the clinic or go to the nearest emergency room if any of your symptoms worsen or new symptoms occur.

## 2021-06-07 ENCOUNTER — Other Ambulatory Visit: Payer: Self-pay

## 2021-06-07 ENCOUNTER — Encounter (HOSPITAL_COMMUNITY): Payer: Self-pay

## 2021-06-07 ENCOUNTER — Emergency Department (HOSPITAL_COMMUNITY)
Admission: EM | Admit: 2021-06-07 | Discharge: 2021-06-07 | Disposition: A | Discharge status: Home / Self Care | Payer: 59 | Attending: Student | Admitting: Student

## 2021-06-07 ENCOUNTER — Emergency Department (HOSPITAL_COMMUNITY): Payer: 59

## 2021-06-07 DIAGNOSIS — R079 Chest pain, unspecified: Secondary | ICD-10-CM | POA: Diagnosis present

## 2021-06-07 LAB — COMPREHENSIVE METABOLIC PANEL
ALT: 30 U/L (ref 0–44)
AST: 25 U/L (ref 15–41)
Albumin: 3.9 g/dL (ref 3.5–5.0)
Alkaline Phosphatase: 51 U/L (ref 38–126)
Anion gap: 9 (ref 5–15)
BUN: 17 mg/dL (ref 6–20)
CO2: 23 mmol/L (ref 22–32)
Calcium: 9.4 mg/dL (ref 8.9–10.3)
Chloride: 107 mmol/L (ref 98–111)
Creatinine, Ser: 0.94 mg/dL (ref 0.61–1.24)
GFR, Estimated: >60 mL/min (ref >60)
Glucose, Bld: 113 mg/dL — ABNORMAL HIGH (ref 70–99)
Potassium: 4.4 mmol/L (ref 3.5–5.1)
Sodium: 139 mmol/L (ref 135–145)
Total Bilirubin: 1.2 mg/dL (ref 0.3–1.2)
Total Protein: 6.8 g/dL (ref 6.5–8.1)

## 2021-06-07 LAB — CBC WITH DIFFERENTIAL/PLATELET
Abs Immature Granulocytes: 0.05 10*3/uL (ref 0.00–0.07)
Basophils Absolute: 0.1 10*3/uL (ref 0.0–0.1)
Basophils Relative: 1 %
Eosinophils Absolute: 0.1 10*3/uL (ref 0.0–0.5)
Eosinophils Relative: 2 %
HCT: 45.2 % (ref 39.0–52.0)
Hemoglobin: 15.6 g/dL (ref 13.0–17.0)
Immature Granulocytes: 1 %
Lymphocytes Relative: 23 %
Lymphs Abs: 1.7 10*3/uL (ref 0.7–4.0)
MCH: 30.4 pg (ref 26.0–34.0)
MCHC: 34.5 g/dL (ref 30.0–36.0)
MCV: 88.1 fL (ref 80.0–100.0)
Monocytes Absolute: 0.5 10*3/uL (ref 0.1–1.0)
Monocytes Relative: 6 %
Neutro Abs: 5.2 10*3/uL (ref 1.7–7.7)
Neutrophils Relative %: 67 %
Platelets: 245 10*3/uL (ref 150–400)
RBC: 5.13 MIL/uL (ref 4.22–5.81)
RDW: 12.3 % (ref 11.5–15.5)
WBC: 7.7 10*3/uL (ref 4.0–10.5)
nRBC: 0 % (ref 0.0–0.2)

## 2021-06-07 LAB — TROPONIN I (HIGH SENSITIVITY)
Troponin I (High Sensitivity): 3 ng/L (ref <18)
Troponin I (High Sensitivity): 4 ng/L (ref <18)

## 2021-06-07 NOTE — ED Provider Notes (Signed)
?Todd [name] ?Provider Note ? ? ?CSN: [id number] ?Arrival date & time: 06/07/21  1248 ? ?  ? ?History ? ?Chief Complaint  ?Patient presents with  ? Chest Pain  ? ? ?Todd [name] is a 61 y.o. male. ? ?HPI ?Patient is a 61 year old male with a history of TIA, palpitations, shortness of breath, chest pain, who presents to the emergency department due to chest pain.  States that he was driving yesterday evening and began experiencing an episode of left-sided chest pressure.  States it lasted about 20 to 30 minutes and spontaneously resolved.  Reports mild shortness of breath when this occurred.  States it is nonradiating.  Patient states he then had another episode around lunchtime today.  Describes it in the same way and states that it lasted about 20 to 30 minutes before resolving once again.  Currently asymptomatic and lying comfortably in bed.  Denies any nausea, vomiting, diaphoresis.  States that he smokes hookah about once per month but denies any other history of smoking.  No history of MI.  States that his father was diagnosed with CHF in his 49s but otherwise denies any known family cardiac history.  Patient denies a history of recent surgery/immobilization, recent trauma, history of blood clot, hemoptysis, recent cancer treatment. ?  ? ?Home Medications ?Prior to Admission medications   ?Medication Sig Start Date End Date Taking? Authorizing Provider  ?aspirin EC 81 MG tablet Take 1 tablet (81 mg total) by mouth daily. Swallow whole. ?Patient not taking: Reported on 06/05/2020 05/31/20   Garvin Fila, MD  ?hydrocortisone-pramoxine [facility]) 2.5-1 % rectal cream Place 1 application rectally 3 (three) times daily. ?Patient not taking: Reported on 01/04/2021 12/11/20   Wendie Agreste, MD  ?ibuprofen (ADVIL) 600 MG tablet Take 1 tablet (600 mg total) by mouth every 6 (six) hours as needed. ?Patient not taking: Reported on 05/13/2021 04/09/20   Melynda Ripple, MD   ?metoprolol tartrate (LOPRESSOR) 50 MG tablet Take 1 tablet by mouth 2 hours prior to CT test ?Patient not taking: Reported on 05/13/2021 01/04/21   Pixie Casino, MD  ?nystatin (MYCOSTATIN/NYSTOP) powder Apply 1 application topically 3 (three) times daily. ?Patient not taking: Reported on 10/22/2020 06/05/20   Maximiano Coss, NP  ?rosuvastatin (CRESTOR) 5 MG tablet Take 1 tablet (5 mg total) by mouth daily. ?Patient not taking: Reported on 05/13/2021 10/22/20   Maximiano Coss, NP  ?terbinafine (LAMISIL) 1 % cream Apply 1 application topically 2 (two) times daily. 12/11/20   Wendie Agreste, MD  ?   ? ?Allergies    ?Wellbutrin [bupropion hcl]   ? ?Review of Systems   ?Review of Systems  ?All other systems reviewed and are negative. ?Ten systems reviewed and are negative for acute change, except as noted in the HPI.   ?Physical Exam ?Updated Vital Signs ?BP 132/90 (BP Location: Left Arm)   Pulse 73   Temp 98.3 ?F (36.8 ?C) (Oral)   Resp 16   Ht '5\' 2"'$  (1.575 m)   Wt 78 kg   SpO2 98%   BMI 31.46 kg/m?  ?Physical Exam ?Vitals and nursing note reviewed.  ?Constitutional:   ?   General: He is not in acute distress. ?   Appearance: Normal appearance. He is well-developed. He is not ill-appearing, toxic-appearing or diaphoretic.  ?HENT:  ?   Head: Normocephalic and atraumatic.  ?   Right Ear: External ear normal.  ?   Left Ear: External ear normal.  ?  Nose: Nose normal.  ?   Mouth/Throat:  ?   Mouth: Mucous membranes are moist.  ?   Pharynx: Oropharynx is clear. No oropharyngeal exudate or posterior oropharyngeal erythema.  ?Eyes:  ?   Extraocular Movements: Extraocular movements intact.  ?Cardiovascular:  ?   Rate and Rhythm: Normal rate and regular rhythm.  ?   Pulses: Normal pulses.  ?   Heart sounds: Normal heart sounds. Heart sounds not distant. No murmur heard. ?No systolic murmur is present.  ?No diastolic murmur is present.  ?  No friction rub. No gallop. No S3 or S4 sounds.  ?   Comments: RRR without  M/R/G. ?Pulmonary:  ?   Effort: Pulmonary effort is normal. No tachypnea, accessory muscle usage or respiratory distress.  ?   Breath sounds: Normal breath sounds. No stridor. No wheezing, rhonchi or rales.  ?   Comments: LCTAB without wheezing, rales, or rhonchi. ?Chest:  ?   Chest wall: No tenderness.  ?   Comments: No chest wall tenderness. ?Abdominal:  ?   General: Abdomen is flat.  ?   Tenderness: There is no abdominal tenderness.  ?Musculoskeletal:     ?   General: Normal range of motion.  ?   Cervical back: Normal range of motion and neck supple. No tenderness.  ?   Right lower leg: No tenderness. No edema.  ?   Left lower leg: No tenderness. No edema.  ?   Comments: No pedal edema.  No calf tenderness.  ?Skin: ?   General: Skin is warm and dry.  ?Neurological:  ?   General: No focal deficit present.  ?   Mental Status: He is alert and oriented to person, place, and time.  ?Psychiatric:     ?   Mood and Affect: Mood normal.     ?   Behavior: Behavior normal.  ? ? ?ED Results / Procedures / Treatments   ?Labs ?(all labs ordered are listed, but only abnormal results are displayed) ?Labs Reviewed  ?COMPREHENSIVE METABOLIC PANEL - Abnormal; Notable for the following components:  ?    Result Value  ? Glucose, Bld 113 (*)   ? All other components within normal limits  ?CBC WITH DIFFERENTIAL/PLATELET  ?TROPONIN I (HIGH SENSITIVITY)  ?TROPONIN I (HIGH SENSITIVITY)  ? ? ?EKG ?EKG Interpretation ? ?Date/Time:  Friday June 07 2021 12:47:32 EDT ?Ventricular Rate:  85 ?PR Interval:  144 ?QRS Duration: 82 ?QT Interval:  368 ?QTC Calculation: 437 ?R Axis:   35 ?Text Interpretation: Normal sinus rhythm Inferior infarct , age undetermined Abnormal ECG When compared with ECG of 12-Oct-2020 02:16, PREVIOUS ECG IS PRESENT Confirmed by Kommor, [place] ([id number]) on 06/07/2021 6:30:19 PM ? ?Radiology ?DG Chest 2 View ? ?Result Date: 06/07/2021 ?CLINICAL DATA:  Chest pain EXAM: CHEST - 2 VIEW COMPARISON:  10/12/2020 FINDINGS:  Cardiomediastinal silhouette and pulmonary vasculature are within normal limits. Lungs are clear. IMPRESSION: No acute cardiopulmonary process. Electronically Signed   By: Miachel Roux M.D.   On: 06/07/2021 14:39   ? ?Procedures ?Procedures  ? ?Medications Ordered in ED ?Medications - No data to display ? ?ED Course/ Medical Decision Making/ A&P ?  ?                        ?Medical Decision Making ? ?Pt is a 61 y.o. male who presents to the emergency department due to two episodes of chest pain.  Currently asymptomatic. ? ?Labs: ?CBC without abnormalities. ?CMP  with a glucose of 113. ?Troponin of 3 with a repeat of 4. ? ?Imaging: ?Chest x-ray shows no acute cardiopulmonary process. ? ?I, Rayna Sexton, PA-C, personally reviewed and evaluated these images and lab results as part of my medical decision-making. ? ?On my exam chest is nontender.  Heart is regular rate and rhythm without murmurs, rubs, or gallops.  Lungs are clear to auscultation bilaterally.  Abdomen is soft and nontender.  No pedal edema noted.  No calf tenderness.  Low risk Wells score.  Does not appear consistent with DVT/PE at this time.  ECG does not appear ischemic.  Chest x-ray is negative.  Troponins appear reassuring.  Doubt ACS at this time. ? ?Patient currently asymptomatic.  Lying comfortably in bed.  Patient appears stable for discharge at this time and he is agreeable.  We discussed return precautions at length.  Patient given a referral to cardiology and recommendations to follow-up with cardiology as soon as possible.  Also recommended PCP follow-up.  His questions were answered and he was amicable at the time of discharge. ? ?Note: Portions of this report may have been transcribed using voice recognition software. Every effort was made to ensure accuracy; however, inadvertent computerized transcription errors may be present.  ? ?Final Clinical Impression(s) / ED Diagnoses ?Final diagnoses:  ?Chest pain, unspecified type  ? ?Rx / DC  Orders ?ED Discharge Orders   ? ? None  ? ?  ? ? ?  ?Rayna Sexton, PA-C ?06/07/21 1852 ? ?  ?Teressa Lower, MD ?06/08/21 0028 ? ?

## 2021-06-07 NOTE — Discharge Instructions (Signed)
Below is the contact information for our cardiology group.  Please give them a call soon as possible to schedule an appointment for reevaluation.  I would also recommend following up with your primary care provider. ? ?If you develop any new or worsening symptoms please come back to the emergency department immediately. ?

## 2021-06-07 NOTE — ED Provider Triage Note (Signed)
Emergency Medicine Provider Triage Evaluation Note ? ?Todd [name] , a 61 y.o. male  was evaluated in triage.  Pt complains of chest pain.  He first had an episode of chest pain yesterday.  That started while he was driving and lasted 38-45 minutes.  He reports that today while seated he had a episode lasting about 40 minutes.  He says it doesn't radiate or move .  ? ?Dr. Debara Pickett is his cardiologist ? ?Physical Exam  ?BP (!) 148/90 (BP Location: Left Arm)   Pulse 78   Temp 98.3 ?F (36.8 ?C) (Oral)   Resp 18   Ht '5\' 2"'$  (1.575 m)   Wt 78 kg   SpO2 99%   BMI 31.46 kg/m?  ?Gen:   Awake, no distress   ?Resp:  Normal effort  ?MSK:   Moves extremities without difficulty  ?Other:  Normal speech.  ? ?Medical Decision Making  ?Medically screening exam initiated at 2:06 PM.  Appropriate orders placed.  Todd [name] was informed that the remainder of the evaluation will be completed by another provider, this initial triage assessment does not replace that evaluation, and the importance of remaining in the ED until their evaluation is complete. ? ? ?  ?Lorin Glass, PA-C ?06/07/21 1410 ? ?

## 2021-06-07 NOTE — ED Notes (Signed)
Pt verbalized understanding of d/c instructions, meds, and followup care. Denies questions. VSS, no distress noted. Steady gait to exit with all belongings.  ?

## 2021-06-07 NOTE — ED Triage Notes (Signed)
Pt arrived POV c/o CP that started yesterday. Pt states pain is more on the left but does not radiate anywhere. Pt endorses SHOB but deneis any N/V. ?

## 2021-06-12 ENCOUNTER — Encounter (HOSPITAL_COMMUNITY): Payer: Self-pay

## 2021-06-12 ENCOUNTER — Ambulatory Visit: Payer: 59 | Admitting: Family Medicine

## 2021-06-12 ENCOUNTER — Emergency Department (HOSPITAL_COMMUNITY): Payer: 59

## 2021-06-12 ENCOUNTER — Encounter: Payer: Self-pay | Admitting: Family Medicine

## 2021-06-12 ENCOUNTER — Emergency Department (HOSPITAL_COMMUNITY)
Admission: EM | Admit: 2021-06-12 | Discharge: 2021-06-12 | Disposition: A | Discharge status: Home / Self Care | Payer: 59 | Attending: Emergency Medicine | Admitting: Emergency Medicine

## 2021-06-12 ENCOUNTER — Other Ambulatory Visit: Payer: Self-pay

## 2021-06-12 VITALS — BP 102/68 | HR 70 | Temp 97.9°F | Ht 67.0 in | Wt 177.2 lb

## 2021-06-12 DIAGNOSIS — K219 Gastro-esophageal reflux disease without esophagitis: Secondary | ICD-10-CM | POA: Insufficient documentation

## 2021-06-12 DIAGNOSIS — R0609 Other forms of dyspnea: Secondary | ICD-10-CM | POA: Diagnosis not present

## 2021-06-12 DIAGNOSIS — R0789 Other chest pain: Secondary | ICD-10-CM

## 2021-06-12 DIAGNOSIS — R1013 Epigastric pain: Secondary | ICD-10-CM | POA: Diagnosis present

## 2021-06-12 DIAGNOSIS — Z7982 Long term (current) use of aspirin: Secondary | ICD-10-CM | POA: Diagnosis not present

## 2021-06-12 DIAGNOSIS — R079 Chest pain, unspecified: Secondary | ICD-10-CM | POA: Diagnosis not present

## 2021-06-12 DIAGNOSIS — R072 Precordial pain: Secondary | ICD-10-CM | POA: Diagnosis not present

## 2021-06-12 DIAGNOSIS — F418 Other specified anxiety disorders: Secondary | ICD-10-CM | POA: Diagnosis not present

## 2021-06-12 LAB — CBC
HCT: 45.8 % (ref 39.0–52.0)
Hemoglobin: 15.8 g/dL (ref 13.0–17.0)
MCH: 30.5 pg (ref 26.0–34.0)
MCHC: 34.5 g/dL (ref 30.0–36.0)
MCV: 88.4 fL (ref 80.0–100.0)
Platelets: 216 10*3/uL (ref 150–400)
RBC: 5.18 MIL/uL (ref 4.22–5.81)
RDW: 12.2 % (ref 11.5–15.5)
WBC: 8.8 10*3/uL (ref 4.0–10.5)
nRBC: 0 % (ref 0.0–0.2)

## 2021-06-12 LAB — BASIC METABOLIC PANEL
Anion gap: 8 (ref 5–15)
BUN: 18 mg/dL (ref 6–20)
CO2: 25 mmol/L (ref 22–32)
Calcium: 9.4 mg/dL (ref 8.9–10.3)
Chloride: 104 mmol/L (ref 98–111)
Creatinine, Ser: 1.13 mg/dL (ref 0.61–1.24)
GFR, Estimated: >60 mL/min (ref >60)
Glucose, Bld: 130 mg/dL — ABNORMAL HIGH (ref 70–99)
Potassium: 4 mmol/L (ref 3.5–5.1)
Sodium: 137 mmol/L (ref 135–145)

## 2021-06-12 LAB — TROPONIN I (HIGH SENSITIVITY): Troponin I (High Sensitivity): <2 ng/L (ref <18)

## 2021-06-12 MED ORDER — ALUM & MAG HYDROXIDE-SIMETH 200-200-20 MG/5ML PO SUSP
30.0000 mL | Freq: Once | ORAL | Status: AC
  Administered 2021-06-12: 30 mL via ORAL
  Filled 2021-06-12: qty 30

## 2021-06-12 MED ORDER — OMEPRAZOLE 20 MG PO CPDR: 20.0000 mg | DELAYED_RELEASE_CAPSULE | Freq: Every day | ORAL | 0 refills | Status: DC

## 2021-06-12 NOTE — ED Provider Notes (Signed)
?[facility] DEPT ?Provider Note ? ? ?CSN: [id number] ?Arrival date & time: 06/12/21  0002 ? ?  ? ?History ? ?Chief Complaint  ?Patient presents with  ? Chest Pain  ? ? ?Todd [name] is a 61 y.o. male. ? ?The history is provided by the patient.  ?Chest Pain ?Pain location:  Epigastric and substernal area ?Pain quality: dull   ?Pain radiates to:  Does not radiate ?Pain severity:  Moderate ?Onset quality:  Gradual ?Duration:  6 days ?Timing:  Constant ?Progression:  Waxing and waning ?Chronicity:  New ?Context: eating   ?Context comment:  Having some symptoms but markedly worse with eating, ate cheesburgers today and it got much worse ?Relieved by:  Nothing ?Exacerbated by: eating. ?Ineffective treatments:  None tried ?Associated symptoms: no abdominal pain, no anorexia, no back pain, no diaphoresis, no fever, no lower extremity edema, no nausea, no orthopnea, no palpitations, no PND, no shortness of breath, no vomiting and no weakness   ?Risk factors: male sex   ?Patient was seen for same on Friday, ruled out for Mi but symptoms persisted and are worse with meals, particularly greasy meals.  No travel, no leg pain, no pain with inspiration.   ?  ? ?Home Medications ?Prior to Admission medications   ?Medication Sig Start Date End Date Taking? Authorizing Provider  ?omeprazole (PRILOSEC) 20 MG capsule Take 1 capsule (20 mg total) by mouth daily. 06/12/21  Yes Deniese Oberry, MD  ?aspirin EC 81 MG tablet Take 1 tablet (81 mg total) by mouth daily. Swallow whole. ?Patient not taking: Reported on 06/05/2020 05/31/20   Garvin Fila, MD  ?hydrocortisone-pramoxine [facility]) 2.5-1 % rectal cream Place 1 application rectally 3 (three) times daily. ?Patient not taking: Reported on 01/04/2021 12/11/20   Wendie Agreste, MD  ?ibuprofen (ADVIL) 600 MG tablet Take 1 tablet (600 mg total) by mouth every 6 (six) hours as needed. ?Patient not taking: Reported on 05/13/2021 04/09/20   Melynda Ripple, MD   ?metoprolol tartrate (LOPRESSOR) 50 MG tablet Take 1 tablet by mouth 2 hours prior to CT test ?Patient not taking: Reported on 05/13/2021 01/04/21   Pixie Casino, MD  ?nystatin (MYCOSTATIN/NYSTOP) powder Apply 1 application topically 3 (three) times daily. ?Patient not taking: Reported on 10/22/2020 06/05/20   Maximiano Coss, NP  ?rosuvastatin (CRESTOR) 5 MG tablet Take 1 tablet (5 mg total) by mouth daily. ?Patient not taking: Reported on 05/13/2021 10/22/20   Maximiano Coss, NP  ?terbinafine (LAMISIL) 1 % cream Apply 1 application topically 2 (two) times daily. 12/11/20   Wendie Agreste, MD  ?   ? ?Allergies    ?Wellbutrin [bupropion hcl]   ? ?Review of Systems   ?Review of Systems  ?Constitutional:  Negative for diaphoresis and fever.  ?HENT:  Negative for facial swelling.   ?Eyes:  Negative for redness.  ?Respiratory:  Negative for shortness of breath.   ?Cardiovascular:  Positive for chest pain. Negative for palpitations, orthopnea and PND.  ?Gastrointestinal:  Negative for abdominal pain, anorexia, nausea and vomiting.  ?Genitourinary:  Negative for difficulty urinating.  ?Musculoskeletal:  Negative for back pain.  ?Neurological:  Negative for facial asymmetry and weakness.  ?Psychiatric/Behavioral:  Negative for agitation.   ?All other systems reviewed and are negative. ? ?Physical Exam ?Updated Vital Signs ?BP 134/85   Pulse 73   Temp 98 ?F (36.7 ?C) (Oral)   Resp 18   Ht '5\' 7"'$  (1.702 m)   Wt 77.1 kg   SpO2  96%   BMI 26.63 kg/m?  ?Physical Exam ?Vitals and nursing note reviewed.  ?Constitutional:   ?   Appearance: Normal appearance. He is not diaphoretic.  ?HENT:  ?   Head: Normocephalic and atraumatic.  ?   Nose: Nose normal.  ?Eyes:  ?   Conjunctiva/sclera: Conjunctivae normal.  ?   Pupils: Pupils are equal, round, and reactive to light.  ?Cardiovascular:  ?   Rate and Rhythm: Normal rate and regular rhythm.  ?   Pulses: Normal pulses.  ?   Heart sounds: Normal heart sounds.  ?Pulmonary:  ?    Effort: Pulmonary effort is normal.  ?   Breath sounds: Normal breath sounds.  ?Abdominal:  ?   General: Bowel sounds are normal.  ?   Palpations: Abdomen is soft.  ?   Tenderness: There is no abdominal tenderness. There is no guarding.  ?Musculoskeletal:     ?   General: Normal range of motion.  ?   Cervical back: Normal range of motion and neck supple.  ?Skin: ?   General: Skin is warm and dry.  ?   Capillary Refill: Capillary refill takes less than 2 seconds.  ?Neurological:  ?   General: No focal deficit present.  ?   Mental Status: He is alert and oriented to person, place, and time.  ?   Deep Tendon Reflexes: Reflexes normal.  ?Psychiatric:     ?   Mood and Affect: Mood normal.     ?   Behavior: Behavior normal.  ? ? ?ED Results / Procedures / Treatments   ?Labs ?(all labs ordered are listed, but only abnormal results are displayed) ?Results for orders placed or performed during the hospital encounter of 06/12/21  ?Basic metabolic panel  ?Result Value Ref Range  ? Sodium 137 135 - 145 mmol/L  ? Potassium 4.0 3.5 - 5.1 mmol/L  ? Chloride 104 98 - 111 mmol/L  ? CO2 25 22 - 32 mmol/L  ? Glucose, Bld 130 (H) 70 - 99 mg/dL  ? BUN 18 6 - 20 mg/dL  ? Creatinine, Ser 1.13 0.61 - 1.24 mg/dL  ? Calcium 9.4 8.9 - 10.3 mg/dL  ? GFR, Estimated >60 >60 mL/min  ? Anion gap 8 5 - 15  ?CBC  ?Result Value Ref Range  ? WBC 8.8 4.0 - 10.5 K/uL  ? RBC 5.18 4.22 - 5.81 MIL/uL  ? Hemoglobin 15.8 13.0 - 17.0 g/dL  ? HCT 45.8 39.0 - 52.0 %  ? MCV 88.4 80.0 - 100.0 fL  ? MCH 30.5 26.0 - 34.0 pg  ? MCHC 34.5 30.0 - 36.0 g/dL  ? RDW 12.2 11.5 - 15.5 %  ? Platelets 216 150 - 400 K/uL  ? nRBC 0.0 0.0 - 0.2 %  ?Troponin I (High Sensitivity)  ?Result Value Ref Range  ? Troponin I (High Sensitivity) <2 <18 ng/L  ? ?DG Chest 2 View ? ?Result Date: 06/12/2021 ?CLINICAL DATA:  chest pain EXAM: CHEST - 2 VIEW COMPARISON:  Chest x-ray 06/07/2020, CT abdomen pelvis 05/01/2019, chest x-ray 08/12/2019, chest x-ray 07/16/2018 FINDINGS: The heart and  mediastinal contours are unchanged. Aortic calcification. Similar-appearing nodular-like density overlying the costophrenic angles on lateral view likely due to overlapping osseous structures and overlying soft tissues versus chronic calcified granuloma. No focal consolidation. No pulmonary edema. No pleural effusion. No pneumothorax. No acute osseous abnormality. IMPRESSION: 1. No active cardiopulmonary disease. 2.  Aortic Atherosclerosis (ICD10-I70.0). Electronically Signed   By: Clelia Croft.D.  On: 06/12/2021 00:36  ? ?DG Chest 2 View ? ?Result Date: 06/07/2021 ?CLINICAL DATA:  Chest pain EXAM: CHEST - 2 VIEW COMPARISON:  10/12/2020 FINDINGS: Cardiomediastinal silhouette and pulmonary vasculature are within normal limits. Lungs are clear. IMPRESSION: No acute cardiopulmonary process. Electronically Signed   By: Miachel Roux M.D.   On: 06/07/2021 14:39   ? ?EKG ?EKG Interpretation ? ?Date/Time:  Wednesday June 12 2021 00:14:00 EDT ?Ventricular Rate:  94 ?PR Interval:  145 ?QRS Duration: 94 ?QT Interval:  346 ?QTC Calculation: 433 ?R Axis:   -40 ?Text Interpretation: Sinus rhythm Abnormal R-wave progression, early transition Confirmed by Randal Buba, Shelina Luo ([id number]) on 06/12/2021 12:34:49 AM ? ?Radiology ?DG Chest 2 View ? ?Result Date: 06/12/2021 ?CLINICAL DATA:  chest pain EXAM: CHEST - 2 VIEW COMPARISON:  Chest x-ray 06/07/2020, CT abdomen pelvis 05/01/2019, chest x-ray 08/12/2019, chest x-ray 07/16/2018 FINDINGS: The heart and mediastinal contours are unchanged. Aortic calcification. Similar-appearing nodular-like density overlying the costophrenic angles on lateral view likely due to overlapping osseous structures and overlying soft tissues versus chronic calcified granuloma. No focal consolidation. No pulmonary edema. No pleural effusion. No pneumothorax. No acute osseous abnormality. IMPRESSION: 1. No active cardiopulmonary disease. 2.  Aortic Atherosclerosis (ICD10-I70.0). Electronically Signed   By: Iven Finn M.D.   On: 06/12/2021 00:36   ? ?Procedures ?Procedures  ? ? ?Medications Ordered in ED ?Medications  ?alum & mag hydroxide-simeth (MAALOX/MYLANTA) 200-200-20 MG/5ML suspension 30 mL (30 mLs Oral Given 3

## 2021-06-12 NOTE — ED Triage Notes (Signed)
Pt was seen Thursday and states that his chest pain is worsening. Pt complains of worsening symptoms 40 mins after he eats. Pt states that it starts calming down after an hr or so after he eats.  ?

## 2021-06-12 NOTE — Patient Instructions (Addendum)
Start aspirin '81mg'$  per day for now.  ?Tylenol if needed for chest wall pain.  ?Omeprazole once per day for possible heartburn.  ?I will try to get you seen by cardiology soon to discuss next steps.  ?See information on anxiety below, but would like to discuss that further at follow up as well.  ?Return to the clinic or go to the nearest emergency room if any of your symptoms worsen or new symptoms occur. ? ? ?Managing Anxiety, Adult ?After being diagnosed with anxiety, you may be relieved to know why you have felt or behaved a certain way. You may also feel overwhelmed about the treatment ahead and what it will mean for your life. With care and support, you can manage this condition. ?How to manage lifestyle changes ?Managing stress and anxiety ?Stress is your body's reaction to life changes and events, both good and bad. Most stress will last just a few hours, but stress can be ongoing and can lead to more than just stress. Although stress can play a major role in anxiety, it is not the same as anxiety. Stress is usually caused by something external, such as a deadline, test, or competition. Stress normally passes after the triggering event has ended.  ?Anxiety is caused by something internal, such as imagining a terrible outcome or worrying that something will go wrong that will devastate you. Anxiety often does not go away even after the triggering event is over, and it can become long-term (chronic) worry. It is important to understand the differences between stress and anxiety and to manage your stress effectively so that it does not lead to an anxious response. ?Talk with your health care provider or a counselor to learn more about reducing anxiety and stress. He or she may suggest tension reduction techniques, such as: ?Music therapy. Spend time creating or listening to music that you enjoy and that inspires you. ?Mindfulness-based meditation. Practice being aware of your normal breaths while not trying to  control your breathing. It can be done while sitting or walking. ?Centering prayer. This involves focusing on a word, phrase, or sacred image that means something to you and brings you peace. ?Deep breathing. To do this, expand your stomach and inhale slowly through your nose. Hold your breath for 3-5 seconds. Then exhale slowly, letting your stomach muscles relax. ?Self-talk. Learn to notice and identify thought patterns that lead to anxiety reactions and change those patterns to thoughts that feel peaceful. ?Muscle relaxation. Taking time to tense muscles and then relax them. ?Choose a tension reduction technique that fits your lifestyle and personality. These techniques take time and practice. Set aside 5-15 minutes a day to do them. Therapists can offer counseling and training in these techniques. The training to help with anxiety may be covered by some insurance plans. ?Other things you can do to manage stress and anxiety include: ?Keeping a stress diary. This can help you learn what triggers your reaction and then learn ways to manage your response. ?Thinking about how you react to certain situations. You may not be able to control everything, but you can control your response. ?Making time for activities that help you relax and not feeling guilty about spending your time in this way. ?Doing visual imagery. This involves imagining or creating mental pictures to help you relax. ?Practicing yoga. Through yoga poses, you can lower tension and promote relaxation. ? ?Medicines ?Medicines can help ease symptoms. Medicines for anxiety include: ?Antidepressant medicines. These are usually prescribed for long-term daily  control. ?Anti-anxiety medicines. These may be added in severe cases, especially when panic attacks occur. ?Medicines will be prescribed by a health care provider. When used together, medicines, psychotherapy, and tension reduction techniques may be the most effective  treatment. ?Relationships ?Relationships can play a big part in helping you recover. Try to spend more time connecting with trusted friends and family members. ?Consider going to couples counseling if you have a partner, taking family education classes, or going to family therapy. ?Therapy can help you and others better understand your condition. ?How to recognize changes in your anxiety ?Everyone responds differently to treatment for anxiety. Recovery from anxiety happens when symptoms decrease and stop interfering with your daily activities at home or work. This may mean that you will start to: ?Have better concentration and focus. Worry will interfere less in your daily thinking. ?Sleep better. ?Be less irritable. ?Have more energy. ?Have improved memory. ?It is also important to recognize when your condition is getting worse. Contact your health care provider if your symptoms interfere with home or work and you feel like your condition is not improving. ?Follow these instructions at home: ?Activity ?Exercise. Adults should do the following: ?Exercise for at least 150 minutes each week. The exercise should increase your heart rate and make you sweat (moderate-intensity exercise). ?Strengthening exercises at least twice a week. ?Get the right amount and quality of sleep. Most adults need 7-9 hours of sleep each night. ?Lifestyle ? ?Eat a healthy diet that includes plenty of vegetables, fruits, whole grains, low-fat dairy products, and lean protein. ?Do not eat a lot of foods that are high in fats, added sugars, or salt (sodium). ?Make choices that simplify your life. ?Do not use any products that contain nicotine or tobacco. These products include cigarettes, chewing tobacco, and vaping devices, such as e-cigarettes. If you need help quitting, ask your health care provider. ?Avoid caffeine, alcohol, and certain over-the-counter cold medicines. These may make you feel worse. Ask your pharmacist which medicines to  avoid. ?General instructions ?Take over-the-counter and prescription medicines only as told by your health care provider. ?Keep all follow-up visits. This is important. ?Where to find support ?You can get help and support from these sources: ?Self-help groups. ?Online and [employer]. ?A trusted spiritual leader. ?Couples counseling. ?Family education classes. ?Family therapy. ?Where to find more information ?You may find that joining a support group helps you deal with your anxiety. The following sources can help you locate counselors or support groups near you: ?[place]: www.mentalhealth[place].net ?Anxiety and Depression Association of America (ADAA): https://www.clark.net/ ?National Alliance on Mental Illness (NAMI): www.nami.org ?Contact a health care provider if: ?You have a hard time staying focused or finishing daily tasks. ?You spend many hours a day feeling worried about everyday life. ?You become exhausted by worry. ?You start to have headaches or frequently feel tense. ?You develop chronic nausea or diarrhea. ?Get help right away if: ?You have a racing heart and shortness of breath. ?You have thoughts of hurting yourself or others. ?If you ever feel like you may hurt yourself or others, or have thoughts about taking your own life, get help right away. Go to your nearest emergency department or: ?Call your local emergency services (911 in the [place] ?Call a suicide crisis helpline, such as the [place] at (330)180-6017 or 988 in the [place]. This is open 24 hours a day in the [place] ?Text the Crisis Text Line at 737-413-2023 (in the [place].). ?Summary ?Taking steps to learn  and use tension reduction techniques can help calm you and help prevent triggering an anxiety reaction. ?When used together, medicines, psychotherapy, and tension reduction techniques may be the most effective treatment. ?Family, friends, and partners can play a big part in supporting you. ?This  information is not intended to replace advice given to you by your health care provider. Make sure you discuss any questions you have with your health care provider. ?Document Revised: 10/03/2020 Document Reviewed: 07/01/2020 ?Elsevier Patient Educ

## 2021-06-12 NOTE — Progress Notes (Signed)
? ?Subjective:  ?Patient ID: Todd [name], male    DOB: [birth year]  Age: 61 y.o. MRN: [id number] ? ?CC:  ?Chief Complaint  ?Patient presents with  ? Chest Pain  ?  Patient complains of chest pain, left shoulder and upper arm pain x6 days, states he was seen in the ER last night and diagnosed with reflux  ? ? ?HPI ?Keyston Ardolino presents for  ? ?Chest pain ?Follow-up from 2 separate ER visits.  Notes reviewed.  Initial ER visit March 17.  Noted left-sided chest pressure while driving day prior.  20 to 30 minutes duration.  Mild associated dyspnea.  Nonradiating pain.  Repeat episode lunchtime on March 17.  No personal history of MI, father with CHF in his 75s but no other early family cardiac history per ER note.  Asymptomatic in the ER.  Low risk Wells score.  Troponins reassuring, chest x-ray without acute findings, EKG did not appear ischemic.  Referred to cardiology and PCP follow-up planned. ? ?Repeat ER visit last night.  Epigastric, substernal pain.  Per ER note no radiation.  Worse symptoms after eating cheeseburger.  HPI noted from ER visit stating symptoms worse with meals particularly greasy meals.  Glucose 130, otherwise BMP normal.  CBC normal.  Troponin normal.  EKG with sinus rhythm, abnormal R wave progression, early transition.  Chest x-ray no active cardiopulmonary disease.  GI cocktail given, PPI therapy was initiated.  Ruled out for MI in the ED.  Thought to be related to GERD.  Discharged with close follow-up, and started on omeprazole - not yet started.  ? ?Feels better today. Notices pain after 30-40 min. Pressure sensation in left chest, feels like a weight on the chest, but different areas of left chest.  ?1/10 currently. Felt better prior to meds in the ER.  ?Associated upper left back, left shoulder and upper arm pain on left when chest pressure occurs - last 38mn to hour.  ?Dry mouth at times.  ?Shortness of breath with CP, no n/v/diaphoresis.  ?No melena, hematochezia, no hx of PUD.  ?No pain  with exertion, some shortness of breath with walking or going up stairs - past year. Seen in October by Dr. HDebara Pickettfor progressive DOE. Plan for ct angiogram - not done, he was worried about taking a pill prior to that test to lower HR.  ?Some recent anxiety time, denies SI, HI, no current medications.  Denies any stressors or situational changes contribute to anxiety ? ?History ?Patient Active Problem List  ? Diagnosis Date Noted  ? Shortness of breath 11/12/2016  ? Chest pain 11/12/2016  ? Heart palpitations 10/20/2013  ? Situational anxiety 10/20/2013  ? Migraine with visual aura 08/24/2013  ? TIA (transient ischemic attack) 08/23/2013  ? Blurred vision 08/23/2013  ? Anomia 08/23/2013  ? ?Past Medical History:  ?Diagnosis Date  ? Complicated migraine   ? Diverticulosis   ? External hemorrhoids   ? External hemorrhoids   ? Headache   ? Hx of cardiovascular stress test   ? Myoview 8/10:  no ischemia, EF 62%; low risk  ? Hx of Doppler ultrasound   ? Carotid [place]:  bilat ICA 1-39%  ? Hx of echocardiogram   ? echo 6/15:  EF 60-65%, no RWMA  ? Internal hemorrhoids   ? Palpitations   ? Stroke ([facility]   ? TIA (transient ischemic attack)   ? Tubular adenoma of colon   ? Vision abnormalities   ? ?Past Surgical History:  ?Procedure  Laterality Date  ? APPENDECTOMY  12/01/11  ? lap appy  ? LAPAROSCOPIC APPENDECTOMY  12/02/2011  ? Procedure: APPENDECTOMY LAPAROSCOPIC;  Surgeon: Madilyn Hook, DO;  Location: [facility];  Service: General;  Laterality: N/A;  ? ?Allergies  ?Allergen Reactions  ? Wellbutrin [Bupropion Hcl] Itching and Rash  ? ?Prior to Admission medications   ?Medication Sig Start Date End Date Taking? Authorizing Provider  ?aspirin EC 81 MG tablet Take 1 tablet (81 mg total) by mouth daily. Swallow whole. 05/31/20  Yes Garvin Fila, MD  ?hydrocortisone-pramoxine [facility]) 2.5-1 % rectal cream Place 1 application rectally 3 (three) times daily. 12/11/20  Yes Wendie Agreste, MD  ?ibuprofen (ADVIL) 600 MG tablet  Take 1 tablet (600 mg total) by mouth every 6 (six) hours as needed. 04/09/20  Yes Melynda Ripple, MD  ?metoprolol tartrate (LOPRESSOR) 50 MG tablet Take 1 tablet by mouth 2 hours prior to CT test 01/04/21  Yes Hilty, Nadean Corwin, MD  ?nystatin (MYCOSTATIN/NYSTOP) powder Apply 1 application topically 3 (three) times daily. 06/05/20  Yes Maximiano Coss, NP  ?omeprazole (PRILOSEC) 20 MG capsule Take 1 capsule (20 mg total) by mouth daily. 06/12/21  Yes Palumbo, April, MD  ?rosuvastatin (CRESTOR) 5 MG tablet Take 1 tablet (5 mg total) by mouth daily. 10/22/20  Yes Maximiano Coss, NP  ?terbinafine (LAMISIL) 1 % cream Apply 1 application topically 2 (two) times daily. 12/11/20  Yes Wendie Agreste, MD  ? ?Social History  ? ?Socioeconomic History  ? Marital status: Divorced  ?  Spouse name: Not on file  ? Number of children: Not on file  ? Years of education: Not on file  ? Highest education level: Not on file  ?Occupational History  ? Occupation: part time  ?Tobacco Use  ? Smoking status: Never  ? Smokeless tobacco: Never  ?Vaping Use  ? Vaping Use: Never used  ?Substance and Sexual Activity  ? Alcohol use: No  ? Drug use: No  ? Sexual activity: Not on file  ?Other Topics Concern  ? Not on file  ?Social History Narrative  ? Lives alone  ? Right Handed  ? Drinks very little caffeine  ? ?Social Determinants of Health  ? ?Financial Resource Strain: Not on file  ?Food Insecurity: Not on file  ?Transportation Needs: Not on file  ?Physical Activity: Not on file  ?Stress: Not on file  ?Social Connections: Not on file  ?Intimate Partner Violence: Not on file  ? ? ?Review of Systems ?Per HPI.  ? ?Objective:  ? ?Vitals:  ? 06/12/21 1025  ?BP: 102/68  ?Pulse: 70  ?Temp: 97.9 ?F (36.6 ?C)  ?TempSrc: Temporal  ?SpO2: 98%  ?Weight: 177 lb 3.2 oz (80.4 kg)  ?Height: '5\' 7"'$  (1.702 m)  ? ? ? ?Physical Exam ?Vitals reviewed.  ?Constitutional:   ?   Appearance: He is well-developed.  ?HENT:  ?   Head: Normocephalic and atraumatic.  ?Neck:  ?    Vascular: No carotid bruit or JVD.  ?Cardiovascular:  ?   Rate and Rhythm: Normal rate and regular rhythm.  ?   Heart sounds: Normal heart sounds. No murmur heard. ?   Comments: Some reproducible soreness left pectoralis, no defect. Min soreness. Upper arm nontender.  ?Pulmonary:  ?   Effort: Pulmonary effort is normal.  ?   Breath sounds: Normal breath sounds. No rales.  ?Abdominal:  ?   General: Abdomen is flat. There is no distension.  ?   Tenderness: There is no  abdominal tenderness. There is no guarding.  ?Musculoskeletal:  ?   Right lower leg: No edema.  ?   Left lower leg: No edema.  ?Skin: ?   General: Skin is warm and dry.  ?Neurological:  ?   Mental Status: He is alert and oriented to person, place, and time.  ?Psychiatric:     ?   Mood and Affect: Mood normal.  ? ?11:44 AM ?Reports some increase in his left sided chest pain.  No associated left arm pain currently neck pain, or back pain.  No jaw pain.  No nausea, vomiting, diaphoresis or new shortness of breath at this time appears comfortable ? ?EKG sinus rhythm, no acute ST or T wave changes.  Rate 72.  No significant changes compared to 06/07/2021.  ? ?49 minutes spent during visit, including chart review, ER note review, additional time of EKG interpretation with in office symptoms, counseling and assimilation of information, exam, discussion of plan, and chart completion.  ? ?Assessment & Plan:  ?Jhonny Calixto is a 61 y.o. male . ?Left-sided chest pain - Plan: Ambulatory referral to Cardiology, EKG 12-Lead ? ?DOE (dyspnea on exertion) ? ?Chest wall pain ? ?Situational anxiety ? ?Ongoing dyspnea on exertion now with some intermittent left-sided chest pressure.  Combine symptoms including some possible situational anxiety, chest wall pain with some reproducibility, possible GERD component with association with meals.  Overall reassuring evaluation through ER including troponins that were negative, and no apparent EKG changes.  However with pressure  sensation, underlying hyperlipidemia and persistent dyspnea on exertion we will have him evaluated with cardiology.  Referral placed.  ER precautions given.  Handout given on chest pain as well as managing

## 2021-06-14 ENCOUNTER — Encounter (HOSPITAL_BASED_OUTPATIENT_CLINIC_OR_DEPARTMENT_OTHER): Payer: Self-pay | Admitting: Family

## 2021-06-14 ENCOUNTER — Ambulatory Visit (INDEPENDENT_AMBULATORY_CARE_PROVIDER_SITE_OTHER): Payer: 59 | Admitting: Family

## 2021-06-14 ENCOUNTER — Other Ambulatory Visit: Payer: Self-pay

## 2021-06-14 VITALS — BP 132/64 | HR 80 | Ht 67.0 in | Wt 175.8 lb

## 2021-06-14 DIAGNOSIS — E785 Hyperlipidemia, unspecified: Secondary | ICD-10-CM | POA: Diagnosis not present

## 2021-06-14 DIAGNOSIS — R072 Precordial pain: Secondary | ICD-10-CM | POA: Diagnosis not present

## 2021-06-14 DIAGNOSIS — R079 Chest pain, unspecified: Secondary | ICD-10-CM | POA: Diagnosis not present

## 2021-06-14 MED ORDER — ROSUVASTATIN CALCIUM 5 MG PO TABS: 5.0000 mg | ORAL_TABLET | Freq: Every day | ORAL | 5 refills | Status: DC

## 2021-06-14 MED ORDER — METOPROLOL TARTRATE 50 MG PO TABS: ORAL_TABLET | ORAL | 0 refills | Status: DC

## 2021-06-14 NOTE — Progress Notes (Signed)
? ?Office Visit  ?  ?Patient Name: Todd [name] ?Date of Encounter: 06/15/2021 ? ?PCP:  Wendie Agreste, MD ?  ?[place]  ?Cardiologist:  Pixie Casino, MD  ?Advanced Practice Provider:  No care team member to display ?Electrophysiologist:  None  ?   ? ?Chief Complaint  ?  ?Todd [name] is a 61 y.o. male with a hx of atypical chest pain, GERD, HLD presents today for follow up after ED visit for chest pain  ? ?Past Medical History  ?  ?Past Medical History:  ?Diagnosis Date  ? Complicated migraine   ? Diverticulosis   ? External hemorrhoids   ? External hemorrhoids   ? Headache   ? Hx of cardiovascular stress test   ? Myoview 8/10:  no ischemia, EF 62%; low risk  ? Hx of Doppler ultrasound   ? Carotid US 6/15:  bilat ICA 1-39%  ? Hx of echocardiogram   ? echo 6/15:  EF 60-65%, no RWMA  ? Internal hemorrhoids   ? Palpitations   ? Stroke [facility])   ? TIA (transient ischemic attack)   ? Tubular adenoma of colon   ? Vision abnormalities   ? ?Past Surgical History:  ?Procedure Laterality Date  ? APPENDECTOMY  12/01/11  ? lap appy  ? LAPAROSCOPIC APPENDECTOMY  12/02/2011  ? Procedure: APPENDECTOMY LAPAROSCOPIC;  Surgeon: Madilyn Hook, DO;  Location: [facility];  Service: General;  Laterality: N/A;  ? ? ?Allergies ? ?Allergies  ?Allergen Reactions  ? Bupropion Other (See Comments)  ? Wellbutrin [Bupropion Hcl] Itching and Rash  ? ? ?History of Present Illness  ?  ?Todd [name] is a 61 y.o. male with a hx of atypical chest pain, HLD, GERD last seen 01/04/21 by Dr. Debara Pickett. ? ?Prior stress test, echocardiogram, and monitor at [facility] by Dr. Elisabeth Cara. Previous ED visit with complicated migraine vs TIA with blurred vision. He has had multiple ED visits for atypical chest pain. When last seen 01/04/21 he was recommended for cardiac CTA which he has not completed.  ? ?ED visit 06/07/21 for chest pain which was atypical and workup reassuring. Repeat ED visit 06/12/21 with chest pain though to  be GERD. He was treated with GI cocktail and PPI. ? ?He presents today for follow up independently. Works as Mining engineer. Describes chest pain which is most often left sided though does move aroun dhis chest. He endorses some exertional dyspnea which he attributes to inactivity. Reports no shortness of breath at rest. No edema, orthopnea, PND. Reports no palpitations.  He did not have cardiac CTA done previously as he was worried Metoprolol would slow his heart so much that it would stop - reassurance provided. ? ?EKGs/Labs/Other Studies Reviewed:  ? ?The following studies were reviewed today: ? ?EKG:  EKG is not ordered today. EKG independently reviewed from 06/12/21 demonstrates NSR 72 bpm with no acute ST/T wave changes.  ? ?Recent Labs: ?10/22/2020: TSH 2.05 ?06/07/2021: ALT 30 ?06/12/2021: BUN 18; Creatinine, Ser 1.13; Hemoglobin 15.8; Platelets 216; Potassium 4.0; Sodium 137  ?Recent Lipid Panel ?   ?Component Value Date/Time  ? CHOL 238 (H) 10/22/2020 1309  ? CHOL 231 (H) 05/31/2020 0926  ? TRIG 123.0 10/22/2020 1309  ? HDL 46.40 10/22/2020 1309  ? HDL 54 05/31/2020 0926  ? CHOLHDL 5 10/22/2020 1309  ? VLDL 24.6 10/22/2020 1309  ? LDLCALC 167 (H) 10/22/2020 1309  ? Burns 162 (H) 05/31/2020 3343  ? ? ? ?Home  Medications  ? ?Current Meds  ?Medication Sig  ? aspirin EC 81 MG tablet Take 1 tablet (81 mg total) by mouth daily. Swallow whole.  ?  ? ?Review of Systems  ?    ?All other systems reviewed and are otherwise negative except as noted above. ? ?Physical Exam  ?  ?VS:  BP 132/64 (BP Location: Left Arm, Patient Position: Sitting, Cuff Size: Normal)   Pulse 80   Ht '5\' 7"'$  (1.702 m)   Wt 175 lb 12.8 oz (79.7 kg)   SpO2 98%   BMI 27.53 kg/m?  , BMI Body mass index is 27.53 kg/m?. ? ?Wt Readings from Last 3 Encounters:  ?06/14/21 175 lb 12.8 oz (79.7 kg)  ?06/12/21 177 lb 3.2 oz (80.4 kg)  ?06/12/21 170 lb (77.1 kg)  ?  ? ?GEN: Well nourished, well developed, in no acute distress. ?HEENT: normal. ?Neck: Supple,  no JVD, carotid bruits, or masses. ?Cardiac: RRR, no murmurs, rubs, or gallops. No clubbing, cyanosis, edema.  Radials/PT 2+ and equal bilaterally.  ?Respiratory:  Respirations regular and unlabored, clear to auscultation bilaterally. ?GI: Soft, nontender, nondistended. ?MS: No deformity or atrophy. ?Skin: Warm and dry, no rash. ?Neuro:  Strength and sensation are intact. ?Psych: Normal affect. ? ?Assessment & Plan  ?  ?Chest pain - Left sided hcest discomfort at rest and with activity as well as exertional dyspnea. Consider etiology deconditioning, GERD, coronary disease. EKG no acute changes during recent ED visits. To rule out coronary disease, plan for cardiac CTA. ? ?HLD - Reiterated importance of cholesterol management. Crestor '5mg'$  QD resumed. If coronary CTA with elevated calcium score, may need to further up titrate does.  ? ?Anxiety - Continue to follow with PCP. ? ?GERD - Improved with a few doses of Omeprazole. Encouraged to complete at least a 2 week course. Continue to follow with PCP.  ? ? ?  ? ? ?Disposition: Follow up in 3 month(s) with Pixie Casino, MD or APP. ? ?Signed, ?Loel Dubonnet, NP ?06/15/2021, 8:31 PM ?[place] ?

## 2021-06-14 NOTE — Patient Instructions (Signed)
Medication Instructions:  ?Your physician has recommended you make the following change in your medication:  ? ?RESUME Rosuvastatin '5mg'$  daily ? ?TAKE Metoprolol one tablet two hours prior to CTA  ? ?*If you need a refill on your cardiac medications before your next appointment, please call your pharmacy* ? ? ?Lab Work: ?None ordered today.  ? ?Testing/Procedures: ?Your physician has requested that you have cardiac CT. Cardiac computed tomography (CT) is a painless test that uses an x-ray machine to take clear, detailed pictures of your heart. Please follow instruction sheet as given. ? ?Follow-Up: ?At [facility], you and your health needs are our priority.  As part of our continuing mission to provide you with exceptional heart care, we have created designated Provider Care Teams.  These Care Teams include your primary Cardiologist (physician) and Advanced Practice Providers (APPs -  Physician Assistants and Nurse Practitioners) who all work together to provide you with the care you need, when you need it. ? ?We recommend signing up for the patient portal called "MyChart".  Sign up information is provided on this After Visit Summary.  MyChart is used to connect with patients for Virtual Visits (Telemedicine).  Patients are able to view lab/test results, encounter notes, upcoming appointments, etc.  Non-urgent messages can be sent to your provider as well.   ?To learn more about what you can do with MyChart, go to [email or web address].   ? ?Your next appointment:   ?3 month(s) ? ?The format for your next appointment:   ?In Person ? ?Provider:   ?Todd Casino, MD { ? ? ?Other Instructions ? ? ? ?Your cardiac CT will be scheduled at  ? ?[facility] ?[address] ?[place], [place] [address] ?[phone] ? ?If scheduled at [facility], please arrive at the [facility] and Children's Entrance (Entrance C2) of [facility] 30 minutes prior to test start time. ?You can use the FREE  valet parking offered at entrance C (encouraged to control the heart rate for the test)  ?Proceed to the [facility] Radiology Department (first floor) to check-in and test prep. ? ?All radiology patients and guests should use entrance C2 at [facility], accessed from [facility], even though the hospital's physical address listed is [address] ? ? ? ? ? ?Please follow these instructions carefully (unless otherwise directed): ? ?Hold all erectile dysfunction medications at least 3 days (72 hrs) prior to test. ? ?On the Night Before the Test: ?Be sure to Drink plenty of water. ?Do not consume any caffeinated/decaffeinated beverages or chocolate 12 hours prior to your test. ?Do not take any antihistamines 12 hours prior to your test. ? ?On the Day of the Test: ?Drink plenty of water until 1 hour prior to the test. ?Do not eat any food 4 hours prior to the test. ?You may take your regular medications prior to the test.  ?Take metoprolol (Lopressor) two hours prior to test. ?     ?After the Test: ?Drink plenty of water. ?After receiving IV contrast, you may experience a mild flushed feeling. This is normal. ?On occasion, you may experience a mild rash up to 24 hours after the test. This is not dangerous. If this occurs, you can take Benadryl 25 mg and increase your fluid intake. ?If you experience trouble breathing, this can be serious. If it is severe call 911 IMMEDIATELY. If it is mild, please call our office. ? ?We will call to schedule your test 2-4  weeks out understanding that some insurance companies will need an authorization prior to the service being performed.  ? ?For non-scheduling related questions, please contact the cardiac imaging nurse navigator should you have any questions/concerns: ?Marchia Bond, Cardiac Imaging Nurse Navigator ?Gordy Clement, Cardiac Imaging Nurse Navigator ?[facility] ?Direct Office Dial: [phone]  ? ?For scheduling  needs, including cancellations and rescheduling, please call [place], [phone].  ?

## 2021-06-26 ENCOUNTER — Ambulatory Visit: Payer: 59 | Admitting: Family Medicine

## 2021-07-08 ENCOUNTER — Telehealth (HOSPITAL_COMMUNITY): Payer: Self-pay | Admitting: Emergency Medicine

## 2021-07-08 ENCOUNTER — Telehealth (HOSPITAL_COMMUNITY): Payer: Self-pay | Admitting: *Deleted

## 2021-07-08 ENCOUNTER — Encounter (HOSPITAL_COMMUNITY): Payer: Self-pay

## 2021-07-08 NOTE — Telephone Encounter (Signed)
Attempted to call patient regarding upcoming cardiac CT appointment. °Left message on voicemail with name and callback number ° °Jalin Alicea RN Navigator Cardiac Imaging °[facility] °[phone] Office °[phone] Cell ° °

## 2021-07-08 NOTE — Telephone Encounter (Signed)
Reaching out to patient to offer assistance regarding upcoming cardiac imaging study; pt verbalizes understanding of appt date/time, parking situation and where to check in, pre-test NPO status and medications ordered, and verified current allergies; name and call back number provided for further questions should they arise ?Marchia Bond RN Navigator Cardiac Imaging ?[facility] ?[phone] office ?[phone] cell ? ?Arrival 200 ?'50mg'$  metoprolol tartrate ?Denies iv isuses ?

## 2021-07-09 ENCOUNTER — Ambulatory Visit (HOSPITAL_COMMUNITY)
Admission: RE | Admit: 2021-07-09 | Discharge: 2021-07-09 | Disposition: A | Discharge status: Home / Self Care | Payer: 59 | Source: Ambulatory Visit | Attending: Cardiology | Admitting: Cardiology

## 2021-07-09 ENCOUNTER — Other Ambulatory Visit: Payer: Self-pay | Admitting: Cardiology

## 2021-07-09 ENCOUNTER — Ambulatory Visit (HOSPITAL_COMMUNITY)
Admission: RE | Admit: 2021-07-09 | Discharge: 2021-07-09 | Disposition: A | Discharge status: Home / Self Care | Payer: 59 | Source: Ambulatory Visit | Attending: Family | Admitting: Family

## 2021-07-09 ENCOUNTER — Encounter (HOSPITAL_COMMUNITY): Payer: Self-pay

## 2021-07-09 DIAGNOSIS — R931 Abnormal findings on diagnostic imaging of heart and coronary circulation: Secondary | ICD-10-CM

## 2021-07-09 DIAGNOSIS — I2510 Atherosclerotic heart disease of native coronary artery without angina pectoris: Secondary | ICD-10-CM | POA: Diagnosis not present

## 2021-07-09 DIAGNOSIS — R079 Chest pain, unspecified: Secondary | ICD-10-CM | POA: Diagnosis present

## 2021-07-09 DIAGNOSIS — R072 Precordial pain: Secondary | ICD-10-CM | POA: Insufficient documentation

## 2021-07-09 MED ORDER — NITROGLYCERIN 0.4 MG SL SUBL
0.8000 mg | SUBLINGUAL_TABLET | Freq: Once | SUBLINGUAL | Status: AC
  Administered 2021-07-09: 0.8 mg via SUBLINGUAL

## 2021-07-09 MED ORDER — NITROGLYCERIN 0.4 MG SL SUBL
SUBLINGUAL_TABLET | SUBLINGUAL | Status: AC
  Filled 2021-07-09: qty 2

## 2021-07-09 MED ORDER — IOHEXOL 350 MG/ML SOLN
100.0000 mL | Freq: Once | INTRAVENOUS | Status: AC | PRN
Start: 2021-07-09 — End: 2021-07-09
  Administered 2021-07-09: 100 mL via INTRAVENOUS

## 2021-07-10 ENCOUNTER — Telehealth (HOSPITAL_BASED_OUTPATIENT_CLINIC_OR_DEPARTMENT_OTHER): Payer: Self-pay

## 2021-07-10 MED ORDER — METOPROLOL SUCCINATE ER 25 MG PO TB24: 25.0000 mg | ORAL_TABLET | Freq: Every day | ORAL | 3 refills | Status: DC

## 2021-07-10 NOTE — Telephone Encounter (Addendum)
Called results to patient and left results on VM (ok per DPR), instructions left to call office back if patient has any questions!  ? ?Prescriptions updated and sent to pharmacy on file  ? ? ? ? ?----- Message from Loel Dubonnet, NP sent at 07/10/2021  7:50 AM EDT ----- ?Cardiac CTA with coronary calcium score of 370. There is evidence of moderate stenosis in the LAD (one of the heart blood vessels). Recommend starting Toprol '25mg'$  daily for anti-anginal benefit. Further images have been submitted for FFR which will allow [place] to measure blood flow through the vessel to know whether additional intervention is needed. ?

## 2021-07-12 ENCOUNTER — Encounter (HOSPITAL_COMMUNITY): Payer: Self-pay

## 2021-07-12 ENCOUNTER — Emergency Department (HOSPITAL_COMMUNITY)
Admission: EM | Admit: 2021-07-12 | Discharge: 2021-07-12 | Disposition: A | Discharge status: Home / Self Care | Payer: 59 | Attending: Emergency Medicine | Admitting: Emergency Medicine

## 2021-07-12 ENCOUNTER — Emergency Department (HOSPITAL_COMMUNITY): Payer: 59

## 2021-07-12 ENCOUNTER — Telehealth (HOSPITAL_BASED_OUTPATIENT_CLINIC_OR_DEPARTMENT_OTHER): Payer: Self-pay | Admitting: Family

## 2021-07-12 ENCOUNTER — Other Ambulatory Visit: Payer: Self-pay

## 2021-07-12 DIAGNOSIS — R079 Chest pain, unspecified: Secondary | ICD-10-CM | POA: Diagnosis present

## 2021-07-12 DIAGNOSIS — Z7982 Long term (current) use of aspirin: Secondary | ICD-10-CM | POA: Diagnosis not present

## 2021-07-12 DIAGNOSIS — Z8673 Personal history of transient ischemic attack (TIA), and cerebral infarction without residual deficits: Secondary | ICD-10-CM | POA: Diagnosis not present

## 2021-07-12 DIAGNOSIS — R931 Abnormal findings on diagnostic imaging of heart and coronary circulation: Secondary | ICD-10-CM

## 2021-07-12 LAB — BASIC METABOLIC PANEL
Anion gap: 5 (ref 5–15)
BUN: 19 mg/dL (ref 6–20)
CO2: 24 mmol/L (ref 22–32)
Calcium: 9 mg/dL (ref 8.9–10.3)
Chloride: 109 mmol/L (ref 98–111)
Creatinine, Ser: 1.19 mg/dL (ref 0.61–1.24)
GFR, Estimated: >60 mL/min (ref >60)
Glucose, Bld: 100 mg/dL — ABNORMAL HIGH (ref 70–99)
Potassium: 3.7 mmol/L (ref 3.5–5.1)
Sodium: 138 mmol/L (ref 135–145)

## 2021-07-12 LAB — CBC
HCT: 45.9 % (ref 39.0–52.0)
Hemoglobin: 15.8 g/dL (ref 13.0–17.0)
MCH: 30.4 pg (ref 26.0–34.0)
MCHC: 34.4 g/dL (ref 30.0–36.0)
MCV: 88.3 fL (ref 80.0–100.0)
Platelets: 257 10*3/uL (ref 150–400)
RBC: 5.2 MIL/uL (ref 4.22–5.81)
RDW: 12.3 % (ref 11.5–15.5)
WBC: 9 10*3/uL (ref 4.0–10.5)
nRBC: 0 % (ref 0.0–0.2)

## 2021-07-12 LAB — TROPONIN I (HIGH SENSITIVITY)
Troponin I (High Sensitivity): 2 ng/L (ref <18)
Troponin I (High Sensitivity): 2 ng/L (ref <18)

## 2021-07-12 MED ORDER — NITROGLYCERIN 0.4 MG SL SUBL: 0.4000 mg | SUBLINGUAL_TABLET | SUBLINGUAL | 3 refills | Status: DC | PRN

## 2021-07-12 NOTE — ED Triage Notes (Signed)
Pt reports with sharp and pressure chest pains x 1 hr.  ?

## 2021-07-12 NOTE — Telephone Encounter (Signed)
Cardiac CTA 06/2021 mixed plaque RCA not well visualized due to misregistration artifact, mid LAD 25-49% soft plaque, focal moderate 50-69% just below soft plaque, LCx with focal minimal calcified plaque. He was recommended to start Toprol '25mg'$  QD for antianginal benefit. Subsequent FFR with LAD showing prox FFR = 0.97, mid FFR = 0.75, distal FFR = 0.71. FFR reveals flow limiting stenosis in mid to distal LAD. Recommendation for cardiac catheterization.  ED visit today for chest pain with HS troponin 2 and 2, pain free in ED, discharged home. He was discharged today prior to [facility] results being available.  ? ?Has not yet started Metoprolol Succinate as recommended as he thought it was for hypertension. We reviewed that it is prescribed for angina and encouraged to start. Encouraged to start Toprol '25mg'$  QD. Rx for PRN Nitroglycerin sent to pharmacy. ? ?The risks [stroke (1 in 1000), death (1 in 68), kidney failure [usually temporary] (1 in 500), bleeding (1 in 200), allergic reaction [possibly serious] (1 in 200)], benefits (diagnostic support and management of coronary artery disease) and alternatives of a cardiac catheterization were discussed in detail with Mr. Schellenberg and at this time he is not willing to proceed, prefers to wait to discuss again at his appointment with our office Tuesday. ED precautions reviewed. ? ?Will route to PCP as FYI as he sees him Monday.  ? ?Loel Dubonnet, NP ? ?

## 2021-07-12 NOTE — ED Notes (Signed)
Patient stating his chest is no longer hurting   ? ?

## 2021-07-12 NOTE — Discharge Instructions (Addendum)
Cardiac work-up today was reassuring. ?We do recommend that you follow-up closely with your cardiologist. ?Return here for any new or acute changes. ?

## 2021-07-12 NOTE — ED Provider Notes (Signed)
?[facility] DEPT ?Provider Note ? ? ?CSN: [id number] ?Arrival date & time: 07/12/21  0050 ? ?  ? ?History ? ?Chief Complaint  ?Patient presents with  ? Chest Pain  ? ? ?Todd [name] is a 61 y.o. male. ? ?The history is provided by the patient and medical records.  ?Chest Pain ? ?61 y.o. M with hx of migraine headaches, prior TIA, presenting to the ED with chest pain.  Reports this began around midnight, came to the ED approximately 1 hour later.  States pain was left-sided with a little bit of radiation into the left shoulder.  He does not have any shortness of breath, diaphoresis, nausea, or vomiting.  States he has had pain like this in the past and had been evaluated in the ED and told it was acid reflux.  He is not feeling any GERD type symptoms currently.  He denies any cough or fever.  He is currently followed by cardiology, underwent cardiac CT 07/09/21 with calcification score of 370, some moderate stenosis but no occlusions present.  He states they want him started on new medication but has not taken it yet.  Patient denies active chest pain during my exam. ? ?Home Medications ?Prior to Admission medications   ?Medication Sig Start Date End Date Taking? Authorizing Provider  ?aspirin EC 81 MG tablet Take 1 tablet (81 mg total) by mouth daily. Swallow whole. 05/31/20   Garvin Fila, MD  ?hydrocortisone-pramoxine [facility]) 2.5-1 % rectal cream Place 1 application rectally 3 (three) times daily. ?Patient not taking: Reported on 06/14/2021 12/11/20   Wendie Agreste, MD  ?ibuprofen (ADVIL) 600 MG tablet Take 1 tablet (600 mg total) by mouth every 6 (six) hours as needed. ?Patient not taking: Reported on 06/14/2021 04/09/20   Melynda Ripple, MD  ?metoprolol succinate (TOPROL XL) 25 MG 24 hr tablet Take 1 tablet (25 mg total) by mouth daily. 07/10/21   Loel Dubonnet, NP  ?metoprolol tartrate (LOPRESSOR) 50 MG tablet Take 1 tablet by mouth 2 hours prior to CT test 06/14/21    Loel Dubonnet, NP  ?omeprazole (PRILOSEC) 20 MG capsule Take 1 capsule (20 mg total) by mouth daily. ?Patient not taking: Reported on 06/14/2021 06/12/21   Palumbo, April, MD  ?rosuvastatin (CRESTOR) 5 MG tablet Take 1 tablet (5 mg total) by mouth daily. 06/14/21   Loel Dubonnet, NP  ?terbinafine (LAMISIL) 1 % cream Apply 1 application topically 2 (two) times daily. ?Patient not taking: Reported on 06/14/2021 12/11/20   Wendie Agreste, MD  ?   ? ?Allergies    ?Bupropion and Wellbutrin [bupropion hcl]   ? ?Review of Systems   ?Review of Systems  ?Cardiovascular:  Positive for chest pain.  ?All other systems reviewed and are negative. ? ?Physical Exam ?Updated Vital Signs ?BP 126/80   Pulse 60   Temp 98.4 ?F (36.9 ?C) (Oral)   Resp 17   Ht '5\' 7"'$  (1.702 m)   Wt 79.4 kg   SpO2 96%   BMI 27.41 kg/m?  ? ?Physical Exam ?Vitals and nursing note reviewed.  ?Constitutional:   ?   Appearance: He is well-developed.  ?HENT:  ?   Head: Normocephalic and atraumatic.  ?Eyes:  ?   Conjunctiva/sclera: Conjunctivae normal.  ?   Pupils: Pupils are equal, round, and reactive to light.  ?Cardiovascular:  ?   Rate and Rhythm: Normal rate and regular rhythm.  ?   Heart sounds: Normal heart sounds.  ?Pulmonary:  ?  Effort: Pulmonary effort is normal.  ?   Breath sounds: Normal breath sounds.  ?Abdominal:  ?   General: Bowel sounds are normal.  ?   Palpations: Abdomen is soft.  ?Musculoskeletal:     ?   General: Normal range of motion.  ?   Cervical back: Normal range of motion.  ?Skin: ?   General: Skin is warm and dry.  ?Neurological:  ?   Mental Status: He is alert and oriented to person, place, and time.  ? ? ?ED Results / Procedures / Treatments   ?Labs ?(all labs ordered are listed, but only abnormal results are displayed) ?Labs Reviewed  ?BASIC METABOLIC PANEL - Abnormal; Notable for the following components:  ?    Result Value  ? Glucose, Bld 100 (*)   ? All other components within normal limits  ?CBC  ?TROPONIN I (HIGH  SENSITIVITY)  ?TROPONIN I (HIGH SENSITIVITY)  ? ? ?EKG ?EKG Interpretation ? ?Date/Time:  Friday July 12 2021 00:57:20 EDT ?Ventricular Rate:  79 ?PR Interval:  142 ?QRS Duration: 103 ?QT Interval:  382 ?QTC Calculation: 438 ?R Axis:   72 ?Text Interpretation: Sinus rhythm Normal ECG When compared with ECG of 06/12/2021, No significant change was found Confirmed by Delora Fuel ([id number]) on 07/12/2021 1:23:34 AM ? ?Radiology ?DG Chest 2 View ? ?Result Date: 07/12/2021 ?CLINICAL DATA:  Chest pain EXAM: CHEST - 2 VIEW COMPARISON:  06/12/2021 FINDINGS: The heart size and mediastinal contours are within normal limits. Both lungs are clear. The visualized skeletal structures are unremarkable. IMPRESSION: No active cardiopulmonary disease. Electronically Signed   By: Ulyses Jarred M.D.   On: 07/12/2021 01:18   ? ?Procedures ?Procedures  ? ? ?Medications Ordered in ED ?Medications - No data to display ? ?ED Course/ Medical Decision Making/ A&P ?  ?                        ?Medical Decision Making ?Amount and/or Complexity of Data Reviewed ?External Data Reviewed: labs, radiology and notes. ?Labs: ordered. ?Radiology: ordered and independent interpretation performed. ?ECG/medicine tests: ordered and independent interpretation performed. ? ? ?61 y.o. M here with chest pain.  Recent cardiac CT with moderate risk score, no definitive occlusions.  Denies prior cardiac history.  He is afebrile, non-toxic.  Currently denying any pain during my exam.  VSS.  Initial EKG without acute ischemic changes.  Labs reviewed-- CBC normal, no electrolyte derangement, normal first troponin.  Chest x-ray is clear.  He is pain free but pain did just began about 1 hour PTA.  Will obtain delta troponin, monitor. ? ?Delta trop is negative.  Patient remains pain free.  Given reassuring work-up, low suspicion for ACS, PE, dissection, other acute cardiac event.  Feel he is stable for discharge.  Have recommended close follow-up with his cardiologist.  Can  return here for any new or acute changes. ? ?Final Clinical Impression(s) / ED Diagnoses ?Final diagnoses:  ?Chest pain in adult  ? ? ?Rx / DC Orders ?ED Discharge Orders   ? ? None  ? ?  ? ? ?  ?Larene Pickett, PA-C ?07/12/21 0457 ? ?  ?Delora Fuel, MD ?80/99/83 0732 ? ?

## 2021-07-15 ENCOUNTER — Ambulatory Visit: Payer: 59 | Admitting: Family Medicine

## 2021-07-15 ENCOUNTER — Encounter: Payer: Self-pay | Admitting: Family Medicine

## 2021-07-15 VITALS — BP 126/70 | HR 62 | Temp 98.1°F | Resp 16 | Ht 67.0 in | Wt 171.4 lb

## 2021-07-15 DIAGNOSIS — R079 Chest pain, unspecified: Secondary | ICD-10-CM

## 2021-07-15 DIAGNOSIS — I2584 Coronary atherosclerosis due to calcified coronary lesion: Secondary | ICD-10-CM | POA: Diagnosis not present

## 2021-07-15 DIAGNOSIS — I2510 Atherosclerotic heart disease of native coronary artery without angina pectoris: Secondary | ICD-10-CM | POA: Diagnosis not present

## 2021-07-15 NOTE — Progress Notes (Signed)
? ?Subjective:  ?Patient ID: Todd [name], male    DOB: 12/03/1960  Age: 61 y.o. MRN: [id number] ? ?CC:  ?Chief Complaint  ?Patient presents with  ? Results  ?  Pt here to discuss results of CT   ? Hypertension  ?  Pt not taking medication wants to discuss BP stable in office   ? ? ?HPI ?Todd [name] presents for  ? ?Left-sided chest pain ?Discussed at office visit March 22.  Multiple prior ER visits.  Ruled out for MI in ER prior.  Possible relation of chest pain to GERD, with prior GI cocktails, PPI treatment.  Ongoing dyspnea exertion with intermittent left-sided chest pressure at his March 22 visit.  With underlying hyperlipidemia and persistent symptoms referred to cardiology. ?Appointment March 24 with cardiology noted.  Cardiac CTA ordered.  Resumed Crestor 5 mg daily.  Continued on PPI. ?No change with PPI - not taking.  ?Coronary CT on March 18.  No significant extracardiac findings.  Coronary calcium score 370 (183 in the LAD, 60 in the left circumflex, 120 in the right coronary artery)  - moderate stenosis, additional analysis with CT FFR-fractional flow reserve analysis showed flow-limiting stenosis in the mid to distal LAD, recommended cardiac catheterization, started on Toprol, with nitroglycerin if needed for antianginal treatment.  Note received from treating provider at cardiology from Friday, patient plan to follow-up with her tomorrow to discuss these results and plan further. ?Not taking toprol, crestor or NTG.  ?Felt pain few times yesterday. None currently.  ? ? ? ?BP Readings from Last 3 Encounters:  ?07/15/21 126/70  ?07/12/21 130/80  ?07/09/21 119/79  ?Home 132-138/80's ?Lab Results  ?Component Value Date  ? CHOL 238 (H) 10/22/2020  ? HDL 46.40 10/22/2020  ? LDLCALC 167 (H) 10/22/2020  ? TRIG 123.0 10/22/2020  ? CHOLHDL 5 10/22/2020  ? ? ? ? ? ? ?History ?Patient Active Problem List  ? Diagnosis Date Noted  ? Shortness of breath 11/12/2016  ? Chest pain 11/12/2016  ? Heart palpitations  10/20/2013  ? Situational anxiety 10/20/2013  ? Migraine with visual aura 08/24/2013  ? TIA (transient ischemic attack) 08/23/2013  ? Blurred vision 08/23/2013  ? Anomia 08/23/2013  ? ?Past Medical History:  ?Diagnosis Date  ? Complicated migraine   ? Diverticulosis   ? External hemorrhoids   ? External hemorrhoids   ? Headache   ? Hx of cardiovascular stress test   ? Myoview 8/10:  no ischemia, EF 62%; low risk  ? Hx of Doppler ultrasound   ? Carotid US 6/15:  bilat ICA 1-39%  ? Hx of echocardiogram   ? echo 6/15:  EF 60-65%, no RWMA  ? Internal hemorrhoids   ? Palpitations   ? Stroke [facility])   ? TIA (transient ischemic attack)   ? Tubular adenoma of colon   ? Vision abnormalities   ? ?Past Surgical History:  ?Procedure Laterality Date  ? APPENDECTOMY  12/01/11  ? lap appy  ? LAPAROSCOPIC APPENDECTOMY  12/02/2011  ? Procedure: APPENDECTOMY LAPAROSCOPIC;  Surgeon: Madilyn Hook, DO;  Location: [facility] ORS;  Service: General;  Laterality: N/A;  ? ?Allergies  ?Allergen Reactions  ? Bupropion Other (See Comments)  ? Wellbutrin [Bupropion Hcl] Itching and Rash  ? ?Prior to Admission medications   ?Medication Sig Start Date End Date Taking? Authorizing Provider  ?nitroGLYCERIN (NITROSTAT) 0.4 MG SL tablet Place 1 tablet (0.4 mg total) under the tongue every 5 (five) minutes as needed for chest pain. 07/12/21 10/10/21 Yes  Loel Dubonnet, NP  ?terbinafine (LAMISIL) 1 % cream Apply 1 application topically 2 (two) times daily. 12/11/20  Yes Wendie Agreste, MD  ?aspirin EC 81 MG tablet Take 1 tablet (81 mg total) by mouth daily. Swallow whole. ?Patient not taking: Reported on 07/15/2021 05/31/20   Garvin Fila, MD  ?hydrocortisone-pramoxine [facility]) 2.5-1 % rectal cream Place 1 application rectally 3 (three) times daily. ?Patient not taking: Reported on 06/14/2021 12/11/20   Wendie Agreste, MD  ?ibuprofen (ADVIL) 600 MG tablet Take 1 tablet (600 mg total) by mouth every 6 (six) hours as needed. ?Patient not taking: Reported on  06/14/2021 04/09/20   Melynda Ripple, MD  ?metoprolol succinate (TOPROL XL) 25 MG 24 hr tablet Take 1 tablet (25 mg total) by mouth daily. ?Patient not taking: Reported on 07/15/2021 07/10/21   Loel Dubonnet, NP  ?metoprolol tartrate (LOPRESSOR) 50 MG tablet Take 1 tablet by mouth 2 hours prior to CT test ?Patient not taking: Reported on 07/15/2021 06/14/21   Loel Dubonnet, NP  ?omeprazole (PRILOSEC) 20 MG capsule Take 1 capsule (20 mg total) by mouth daily. ?Patient not taking: Reported on 06/14/2021 06/12/21   Palumbo, April, MD  ?rosuvastatin (CRESTOR) 5 MG tablet Take 1 tablet (5 mg total) by mouth daily. ?Patient not taking: Reported on 07/15/2021 06/14/21   Loel Dubonnet, NP  ? ?Social History  ? ?Socioeconomic History  ? Marital status: Divorced  ?  Spouse name: Not on file  ? Number of children: Not on file  ? Years of education: Not on file  ? Highest education level: Not on file  ?Occupational History  ? Occupation: part time  ?Tobacco Use  ? Smoking status: Never  ? Smokeless tobacco: Never  ?Vaping Use  ? Vaping Use: Never used  ?Substance and Sexual Activity  ? Alcohol use: No  ? Drug use: No  ? Sexual activity: Not on file  ?Other Topics Concern  ? Not on file  ?Social History Narrative  ? Lives alone  ? Right Handed  ? Drinks very little caffeine  ? ?Social Determinants of Health  ? ?Financial Resource Strain: Not on file  ?Food Insecurity: Not on file  ?Transportation Needs: Not on file  ?Physical Activity: Not on file  ?Stress: Not on file  ?Social Connections: Not on file  ?Intimate Partner Violence: Not on file  ? ? ?Review of Systems ?Per HPI.  ? ?Objective:  ? ?Vitals:  ? 07/15/21 1050  ?BP: 126/70  ?Pulse: 62  ?Resp: 16  ?Temp: 98.1 ?F (36.7 ?C)  ?TempSrc: Temporal  ?SpO2: 97%  ?Weight: 171 lb 6.4 oz (77.7 kg)  ?Height: '5\' 7"'$  (1.702 m)  ? ? ? ?Physical Exam ?Vitals reviewed.  ?Constitutional:   ?   Appearance: He is well-developed.  ?HENT:  ?   Head: Normocephalic and atraumatic.  ?Neck:   ?   Vascular: No carotid bruit or JVD.  ?Cardiovascular:  ?   Rate and Rhythm: Normal rate and regular rhythm.  ?   Heart sounds: Normal heart sounds. No murmur heard. ?Pulmonary:  ?   Effort: Pulmonary effort is normal.  ?   Breath sounds: Normal breath sounds. No rales.  ?Musculoskeletal:  ?   Right lower leg: No edema.  ?   Left lower leg: No edema.  ?Skin: ?   General: Skin is warm and dry.  ?Neurological:  ?   Mental Status: He is alert and oriented to person, place, and time.  ?  Psychiatric:     ?   Mood and Affect: Mood normal.  ? ? ?38 minutes spent during visit, including chart review, counseling and assimilation of information, exam, discussion of CCS, FFR, rationale for toprol, crestor and cardiac cath, plan, and chart completion.  ? ?Assessment & Plan:  ?Todd [name] is a 61 y.o. male . ?Left-sided chest pain ? ?Coronary artery calcification ? -Intermittent left-sided chest pain, still with intermittent symptoms.  Coronary calcium scoring and FFR concerning for possible obstructive coronary artery.  Denies chest pain in office.  We discussed his questions and results of these tests as well as rationale for different medications prescribed and plan moving forward.  He expressed understanding,  all questions were answered.  ?- Stressed importance of close follow-up with cardiology tomorrow and cardiac catheterization for possible intervention.  Start Toprol, start Crestor.  Nitroglycerin if needed for chest symptoms with the ER/#1 precautions given. ? ?No orders of the defined types were placed in this encounter. ? ?Patient Instructions  ?I do recommend starting the metoprolol today, and rosuvastatin tonight. Nitroglycerin if you do have pain - be seen in the emergency room or call 911 if repeat needed or not helping pain. Keep appointment with cardiology tomorrow.  ?Return to the clinic or go to the nearest emergency room if any of your symptoms worsen or new symptoms occur. ? ? ? ? ?Signed,  ? ?Merri Ray, MD ?[place], [facility] ?[facility] ?07/15/21 ?11:36 AM ? ? ?

## 2021-07-15 NOTE — Patient Instructions (Addendum)
I do recommend starting the metoprolol today, and rosuvastatin tonight. Nitroglycerin if you do have pain - be seen in the emergency room or call 911 if repeat needed or not helping pain. Keep appointment with cardiology tomorrow.  ?Return to the clinic or go to the nearest emergency room if any of your symptoms worsen or new symptoms occur. ? ?

## 2021-07-16 ENCOUNTER — Emergency Department (HOSPITAL_BASED_OUTPATIENT_CLINIC_OR_DEPARTMENT_OTHER): Payer: 59 | Admitting: Radiology

## 2021-07-16 ENCOUNTER — Emergency Department (HOSPITAL_BASED_OUTPATIENT_CLINIC_OR_DEPARTMENT_OTHER)
Admission: EM | Admit: 2021-07-16 | Discharge: 2021-07-17 | Disposition: A | Discharge status: Home / Self Care | Payer: 59 | Source: Home / Self Care | Attending: Emergency Medicine | Admitting: Emergency Medicine

## 2021-07-16 ENCOUNTER — Encounter (HOSPITAL_BASED_OUTPATIENT_CLINIC_OR_DEPARTMENT_OTHER): Payer: Self-pay | Admitting: Family

## 2021-07-16 ENCOUNTER — Encounter (HOSPITAL_BASED_OUTPATIENT_CLINIC_OR_DEPARTMENT_OTHER): Payer: Self-pay

## 2021-07-16 ENCOUNTER — Ambulatory Visit (INDEPENDENT_AMBULATORY_CARE_PROVIDER_SITE_OTHER): Payer: 59 | Admitting: Family

## 2021-07-16 VITALS — BP 126/78 | HR 73 | Ht 67.0 in | Wt 172.0 lb

## 2021-07-16 DIAGNOSIS — Z79899 Other long term (current) drug therapy: Secondary | ICD-10-CM | POA: Diagnosis not present

## 2021-07-16 DIAGNOSIS — E785 Hyperlipidemia, unspecified: Secondary | ICD-10-CM | POA: Diagnosis not present

## 2021-07-16 DIAGNOSIS — Z7982 Long term (current) use of aspirin: Secondary | ICD-10-CM | POA: Diagnosis not present

## 2021-07-16 DIAGNOSIS — R200 Anesthesia of skin: Secondary | ICD-10-CM | POA: Insufficient documentation

## 2021-07-16 DIAGNOSIS — I25110 Atherosclerotic heart disease of native coronary artery with unstable angina pectoris: Secondary | ICD-10-CM | POA: Diagnosis not present

## 2021-07-16 DIAGNOSIS — Z955 Presence of coronary angioplasty implant and graft: Secondary | ICD-10-CM | POA: Diagnosis not present

## 2021-07-16 DIAGNOSIS — I25118 Atherosclerotic heart disease of native coronary artery with other forms of angina pectoris: Secondary | ICD-10-CM | POA: Diagnosis not present

## 2021-07-16 DIAGNOSIS — I200 Unstable angina: Secondary | ICD-10-CM | POA: Insufficient documentation

## 2021-07-16 DIAGNOSIS — F419 Anxiety disorder, unspecified: Secondary | ICD-10-CM | POA: Diagnosis not present

## 2021-07-16 DIAGNOSIS — K219 Gastro-esophageal reflux disease without esophagitis: Secondary | ICD-10-CM | POA: Diagnosis not present

## 2021-07-16 LAB — CBC
HCT: 44.9 % (ref 39.0–52.0)
Hemoglobin: 15.4 g/dL (ref 13.0–17.0)
MCH: 29.6 pg (ref 26.0–34.0)
MCHC: 34.3 g/dL (ref 30.0–36.0)
MCV: 86.3 fL (ref 80.0–100.0)
Platelets: 244 10*3/uL (ref 150–400)
RBC: 5.2 MIL/uL (ref 4.22–5.81)
RDW: 12.2 % (ref 11.5–15.5)
WBC: 7.9 10*3/uL (ref 4.0–10.5)
nRBC: 0 % (ref 0.0–0.2)

## 2021-07-16 LAB — TROPONIN I (HIGH SENSITIVITY)
Troponin I (High Sensitivity): <2 ng/L (ref <18)
Troponin I (High Sensitivity): <2 ng/L (ref <18)

## 2021-07-16 LAB — BASIC METABOLIC PANEL
Anion gap: 10 (ref 5–15)
BUN: 18 mg/dL (ref 6–20)
CO2: 22 mmol/L (ref 22–32)
Calcium: 9.6 mg/dL (ref 8.9–10.3)
Chloride: 106 mmol/L (ref 98–111)
Creatinine, Ser: 1.1 mg/dL (ref 0.61–1.24)
GFR, Estimated: >60 mL/min (ref >60)
Glucose, Bld: 119 mg/dL — ABNORMAL HIGH (ref 70–99)
Potassium: 3.7 mmol/L (ref 3.5–5.1)
Sodium: 138 mmol/L (ref 135–145)

## 2021-07-16 NOTE — H&P (View-Only) (Signed)
? ?Office Visit  ?  ?Patient Name: Todd [name] ?Date of Encounter: 07/16/2021 ? ?PCP:  Wendie Agreste, MD ?  ?[place]  ?Cardiologist:  Pixie Casino, MD  ?Advanced Practice Provider:  No care team member to display ?Electrophysiologist:  None  ?   ? ?Chief Complaint  ?  ?Todd [name] is a 61 y.o. male with a hx of atypical chest pain, GERD, HLD presents today for follow up after cardiac CTA ? ?Past Medical History  ?  ?Past Medical History:  ?Diagnosis Date  ? Complicated migraine   ? Diverticulosis   ? External hemorrhoids   ? External hemorrhoids   ? Headache   ? Hx of cardiovascular stress test   ? Myoview 8/10:  no ischemia, EF 62%; low risk  ? Hx of Doppler ultrasound   ? Carotid US 6/15:  bilat ICA 1-39%  ? Hx of echocardiogram   ? echo 6/15:  EF 60-65%, no RWMA  ? Internal hemorrhoids   ? Palpitations   ? Stroke [facility])   ? TIA (transient ischemic attack)   ? Tubular adenoma of colon   ? Vision abnormalities   ? ?Past Surgical History:  ?Procedure Laterality Date  ? APPENDECTOMY  12/01/11  ? lap appy  ? LAPAROSCOPIC APPENDECTOMY  12/02/2011  ? Procedure: APPENDECTOMY LAPAROSCOPIC;  Surgeon: Madilyn Hook, DO;  Location: [facility];  Service: General;  Laterality: N/A;  ? ? ?Allergies ? ?Allergies  ?Allergen Reactions  ? Bupropion Other (See Comments)  ? Wellbutrin [Bupropion Hcl] Itching and Rash  ? ? ?History of Present Illness  ?  ?Todd [name] is a 61 y.o. male with a hx of atypical chest pain, HLD, GERD last seen 06/14/21.  ? ?Prior stress test, echocardiogram, and monitor at [facility] by Dr. Elisabeth Cara. Previous ED visit with complicated migraine vs TIA with blurred vision. He has had multiple ED visits for atypical chest pain. When last seen 01/04/21 he was recommended for cardiac CTA which he has not completed.  ? ?ED visit 06/07/21 for chest pain which was atypical and workup reassuring. Repeat ED visit 06/12/21 with chest pain though to be GERD. He was treated  with GI cocktail and PPI. ? ?Seen 06/14/21 for chest pain. Subsequent cardiac CTA 07/09/21 with coronary calciu score of 370 (mLAD 25-49% with focal moderate 50-69% just below soft plaque). FFR indicative of ischemia.  He was recommended to start Toprol 25 mg daily, nitroglycerin as needed.  Discussed via phone 07/12/2021 and he was hesitant regarding proceeding with cardiac catheterization. ? ?He presents today for follow-up.  We again reviewed indication for metoprolol, rosuvastatin and encouraged him to begin taking.  Shares with me that he has a friend with similar symptoms who is interested in pursuing cardiac work-up and provided him our office phone number for friend to schedule an appointment.  Reports that he continues to have intermittent episodes of chest pain at rest and with activity.  He has not taken any of the as needed nitroglycerin but did pick it up from the pharmacy.  He is interested in pursuing cardiac catheterization. ? ?EKGs/Labs/Other Studies Reviewed:  ? ?The following studies were reviewed today: ? ?EKG:  EKG is not ordered today. EKG independently reviewed from 06/12/21 demonstrates NSR 72 bpm with no acute ST/T wave changes.  ? ?Recent Labs: ?10/22/2020: TSH 2.05 ?06/07/2021: ALT 30 ?07/12/2021: BUN 19; Creatinine, Ser 1.19; Hemoglobin 15.8; Platelets 257; Potassium 3.7; Sodium 138  ?Recent Lipid Panel ?   ?  Component Value Date/Time  ? CHOL 238 (H) 10/22/2020 1309  ? CHOL 231 (H) 05/31/2020 0926  ? TRIG 123.0 10/22/2020 1309  ? HDL 46.40 10/22/2020 1309  ? HDL 54 05/31/2020 0926  ? CHOLHDL 5 10/22/2020 1309  ? VLDL 24.6 10/22/2020 1309  ? LDLCALC 167 (H) 10/22/2020 1309  ? [place] 162 (H) 05/31/2020 6811  ? ? ? ?Home Medications  ? ?Current Meds  ?Medication Sig  ? metoprolol succinate (TOPROL XL) 25 MG 24 hr tablet Take 1 tablet (25 mg total) by mouth daily.  ? nitroGLYCERIN (NITROSTAT) 0.4 MG SL tablet Place 1 tablet (0.4 mg total) under the tongue every 5 (five) minutes as needed for chest  pain.  ? rosuvastatin (CRESTOR) 5 MG tablet Take 1 tablet (5 mg total) by mouth daily.  ? terbinafine (LAMISIL) 1 % cream Apply 1 application topically 2 (two) times daily.  ?  ? ?Review of Systems  ?    ?All other systems reviewed and are otherwise negative except as noted above. ? ?Physical Exam  ?  ?VS:  BP 126/78   Pulse 73   Ht '5\' 7"'$  (1.702 m)   Wt 172 lb (78 kg)   SpO2 95%   BMI 26.94 kg/m?  , BMI Body mass index is 26.94 kg/m?. ? ?Wt Readings from Last 3 Encounters:  ?07/16/21 172 lb (78 kg)  ?07/15/21 171 lb 6.4 oz (77.7 kg)  ?07/12/21 175 lb (79.4 kg)  ?  ? ?GEN: Well nourished, well developed, in no acute distress. ?HEENT: normal. ?Neck: Supple, no JVD, carotid bruits, or masses. ?Cardiac: RRR, no murmurs, rubs, or gallops. No clubbing, cyanosis, edema.  Radials/PT 2+ and equal bilaterally.  ?Respiratory:  Respirations regular and unlabored, clear to auscultation bilaterally. ?GI: Soft, nontender, nondistended. ?MS: No deformity or atrophy. ?Skin: Warm and dry, no rash. ?Neuro:  Strength and sensation are intact. ?Psych: Normal affect. ? ?Assessment & Plan  ?  ?CAD-cardiac CTA 06/2021 with coronary artery calcium score of 370 with mid LAD 25-49% soft plaque with focal moderate 50 to 69% stenosis right below the soft fleck.  FFR in the mid LAD 0.75 and distal LAD 0.71 indicative of flow-limiting stenosis.  Continues to have left sided chest discomfort at rest and with activity as well as exertional dyspnea.  Plan for cardiac catheterization.  He has been prescribed aspirin, Crestor, Toprol, as needed nitroglycerin and encouraged medication compliance. ? ?Shared Decision Making/Informed Consent ?The risks [stroke (1 in 1000), death (1 in 52), kidney failure [usually temporary] (1 in 500), bleeding (1 in 200), allergic reaction [possibly serious] (1 in 200)], benefits (diagnostic support and management of coronary artery disease) and alternatives of a cardiac catheterization were discussed in detail  with Mr. Farve and he is willing to proceed.  ? ?HLD - Reiterated importance of cholesterol management. Crestor '5mg'$  QD recently resumed.  Encouraged medication compliance.  Repeat lipid panel at follow-up with consideration of escalation of dose. ? ?Anxiety - Continue to follow with PCP. ? ?Disposition: Follow up in 3 weeks with Pixie Casino, MD or APP. ? ?Signed, ?Loel Dubonnet, NP ?07/16/2021, 9:02 AM ?[place] ?

## 2021-07-16 NOTE — Progress Notes (Signed)
? ?Office Visit  ?  ?Patient Name: Todd [name] ?Date of Encounter: 07/16/2021 ? ?PCP:  Wendie Agreste, MD ?  ?[place]  ?Cardiologist:  Pixie Casino, MD  ?Advanced Practice Provider:  No care team member to display ?Electrophysiologist:  None  ?   ? ?Chief Complaint  ?  ?Todd [name] is a 61 y.o. male with a hx of atypical chest pain, GERD, HLD presents today for follow up after cardiac CTA ? ?Past Medical History  ?  ?Past Medical History:  ?Diagnosis Date  ? Complicated migraine   ? Diverticulosis   ? External hemorrhoids   ? External hemorrhoids   ? Headache   ? Hx of cardiovascular stress test   ? Myoview 8/10:  no ischemia, EF 62%; low risk  ? Hx of Doppler ultrasound   ? Carotid US 6/15:  bilat ICA 1-39%  ? Hx of echocardiogram   ? echo 6/15:  EF 60-65%, no RWMA  ? Internal hemorrhoids   ? Palpitations   ? Stroke [facility])   ? TIA (transient ischemic attack)   ? Tubular adenoma of colon   ? Vision abnormalities   ? ?Past Surgical History:  ?Procedure Laterality Date  ? APPENDECTOMY  12/01/11  ? lap appy  ? LAPAROSCOPIC APPENDECTOMY  12/02/2011  ? Procedure: APPENDECTOMY LAPAROSCOPIC;  Surgeon: Madilyn Hook, DO;  Location: [facility];  Service: General;  Laterality: N/A;  ? ? ?Allergies ? ?Allergies  ?Allergen Reactions  ? Bupropion Other (See Comments)  ? Wellbutrin [Bupropion Hcl] Itching and Rash  ? ? ?History of Present Illness  ?  ?Todd [name] is a 61 y.o. male with a hx of atypical chest pain, HLD, GERD last seen 06/14/21.  ? ?Prior stress test, echocardiogram, and monitor at [facility] by Dr. Elisabeth Cara. Previous ED visit with complicated migraine vs TIA with blurred vision. He has had multiple ED visits for atypical chest pain. When last seen 01/04/21 he was recommended for cardiac CTA which he has not completed.  ? ?ED visit 06/07/21 for chest pain which was atypical and workup reassuring. Repeat ED visit 06/12/21 with chest pain though to be GERD. He was treated  with GI cocktail and PPI. ? ?Seen 06/14/21 for chest pain. Subsequent cardiac CTA 07/09/21 with coronary calciu score of 370 (mLAD 25-49% with focal moderate 50-69% just below soft plaque). FFR indicative of ischemia.  He was recommended to start Toprol 25 mg daily, nitroglycerin as needed.  Discussed via phone 07/12/2021 and he was hesitant regarding proceeding with cardiac catheterization. ? ?He presents today for follow-up.  We again reviewed indication for metoprolol, rosuvastatin and encouraged him to begin taking.  Shares with me that he has a friend with similar symptoms who is interested in pursuing cardiac work-up and provided him our office phone number for friend to schedule an appointment.  Reports that he continues to have intermittent episodes of chest pain at rest and with activity.  He has not taken any of the as needed nitroglycerin but did pick it up from the pharmacy.  He is interested in pursuing cardiac catheterization. ? ?EKGs/Labs/Other Studies Reviewed:  ? ?The following studies were reviewed today: ? ?EKG:  EKG is not ordered today. EKG independently reviewed from 06/12/21 demonstrates NSR 72 bpm with no acute ST/T wave changes.  ? ?Recent Labs: ?10/22/2020: TSH 2.05 ?06/07/2021: ALT 30 ?07/12/2021: BUN 19; Creatinine, Ser 1.19; Hemoglobin 15.8; Platelets 257; Potassium 3.7; Sodium 138  ?Recent Lipid Panel ?   ?  Component Value Date/Time  ? CHOL 238 (H) 10/22/2020 1309  ? CHOL 231 (H) 05/31/2020 0926  ? TRIG 123.0 10/22/2020 1309  ? HDL 46.40 10/22/2020 1309  ? HDL 54 05/31/2020 0926  ? CHOLHDL 5 10/22/2020 1309  ? VLDL 24.6 10/22/2020 1309  ? LDLCALC 167 (H) 10/22/2020 1309  ? [place] 162 (H) 05/31/2020 3300  ? ? ? ?Home Medications  ? ?Current Meds  ?Medication Sig  ? metoprolol succinate (TOPROL XL) 25 MG 24 hr tablet Take 1 tablet (25 mg total) by mouth daily.  ? nitroGLYCERIN (NITROSTAT) 0.4 MG SL tablet Place 1 tablet (0.4 mg total) under the tongue every 5 (five) minutes as needed for chest  pain.  ? rosuvastatin (CRESTOR) 5 MG tablet Take 1 tablet (5 mg total) by mouth daily.  ? terbinafine (LAMISIL) 1 % cream Apply 1 application topically 2 (two) times daily.  ?  ? ?Review of Systems  ?    ?All other systems reviewed and are otherwise negative except as noted above. ? ?Physical Exam  ?  ?VS:  BP 126/78   Pulse 73   Ht '5\' 7"'$  (1.702 m)   Wt 172 lb (78 kg)   SpO2 95%   BMI 26.94 kg/m?  , BMI Body mass index is 26.94 kg/m?. ? ?Wt Readings from Last 3 Encounters:  ?07/16/21 172 lb (78 kg)  ?07/15/21 171 lb 6.4 oz (77.7 kg)  ?07/12/21 175 lb (79.4 kg)  ?  ? ?GEN: Well nourished, well developed, in no acute distress. ?HEENT: normal. ?Neck: Supple, no JVD, carotid bruits, or masses. ?Cardiac: RRR, no murmurs, rubs, or gallops. No clubbing, cyanosis, edema.  Radials/PT 2+ and equal bilaterally.  ?Respiratory:  Respirations regular and unlabored, clear to auscultation bilaterally. ?GI: Soft, nontender, nondistended. ?MS: No deformity or atrophy. ?Skin: Warm and dry, no rash. ?Neuro:  Strength and sensation are intact. ?Psych: Normal affect. ? ?Assessment & Plan  ?  ?CAD-cardiac CTA 06/2021 with coronary artery calcium score of 370 with mid LAD 25-49% soft plaque with focal moderate 50 to 69% stenosis right below the soft fleck.  FFR in the mid LAD 0.75 and distal LAD 0.71 indicative of flow-limiting stenosis.  Continues to have left sided chest discomfort at rest and with activity as well as exertional dyspnea.  Plan for cardiac catheterization.  He has been prescribed aspirin, Crestor, Toprol, as needed nitroglycerin and encouraged medication compliance. ? ?Shared Decision Making/Informed Consent ?The risks [stroke (1 in 1000), death (1 in 5), kidney failure [usually temporary] (1 in 500), bleeding (1 in 200), allergic reaction [possibly serious] (1 in 200)], benefits (diagnostic support and management of coronary artery disease) and alternatives of a cardiac catheterization were discussed in detail  with Todd [name] and he is willing to proceed.  ? ?HLD - Reiterated importance of cholesterol management. Crestor '5mg'$  QD recently resumed.  Encouraged medication compliance.  Repeat lipid panel at follow-up with consideration of escalation of dose. ? ?Anxiety - Continue to follow with PCP. ? ?Disposition: Follow up in 3 weeks with Pixie Casino, MD or APP. ? ?Signed, ?Loel Dubonnet, NP ?07/16/2021, 9:02 AM ?[place] ?

## 2021-07-16 NOTE — Patient Instructions (Addendum)
Medication Instructions:  ?Your Physician recommend you continue on your current medication as directed.   ? ?*If you need a refill on your cardiac medications before your next appointment, please call your pharmacy* ? ? ?Lab Work: ?None ordered today  ? ?Testing/Procedures: ?Your physician has requested that you have a cardiac catheterization. Cardiac catheterization is used to diagnose and/or treat various heart conditions. Doctors may recommend this procedure for a number of different reasons. The most common reason is to evaluate chest pain. Chest pain can be a symptom of coronary artery disease (CAD), and cardiac catheterization can show whether plaque is narrowing or blocking your heart?s arteries. This procedure is also used to evaluate the valves, as well as measure the blood flow and oxygen levels in different parts of your heart. For further information please visit [email or web address]. Please follow instruction sheet, as given. ? ? ? ?Follow-Up: ?At [facility], you and your health needs are our priority.  As part of our continuing mission to provide you with exceptional heart care, we have created designated Provider Care Teams.  These Care Teams include your primary Cardiologist (physician) and Advanced Practice Providers (APPs -  Physician Assistants and Nurse Practitioners) who all work together to provide you with the care you need, when you need it. ? ?We recommend signing up for the patient portal called "MyChart".  Sign up information is provided on this After Visit Summary.  MyChart is used to connect with patients for Virtual Visits (Telemedicine).  Patients are able to view lab/test results, encounter notes, upcoming appointments, etc.  Non-urgent messages can be sent to your provider as well.   ?To learn more about what you can do with MyChart, go to [email or web address].   ? ?Your next appointment:   ?3 week(s) ? ?The format for your next appointment:   ?In Person ? ?Provider:    ?Pixie Casino, MD  or Laurann Montana, NP { ? ?Other Instructions ?You are scheduled for a Cardiac Catheterization on Wednesday, April 26 with Dr. Lauree Chandler. ? ?1. Please arrive at the Main Entrance A at [facility]: [place], [place] [address] at 11:00 AM (This time is two hours before your procedure to ensure your preparation). Free valet parking service is available.  ? ?Special note: Every effort is made to have your procedure done on time. Please understand that emergencies sometimes delay scheduled procedures. ? ?2. Diet: Do not eat solid foods after midnight.  You may have clear liquids until 5 AM upon the day of the procedure. ? ?3. Labs:Done 4/21  ?4. Medication instructions in preparation for your procedure: you may take your normal medications. ? ?On the morning of your procedure, take Aspirin and any morning medicines NOT listed above.  You may use sips of water. ? ?5. Plan to go home the same day, you will only stay overnight if medically necessary. ?6. You MUST have a responsible adult to drive you home. ?7. An adult MUST be with you the first 24 hours after you arrive home. ?8. Bring a current list of your medications, and the last time and date medication taken. ?9. Bring ID and current insurance cards. ?10.Please wear clothes that are easy to get on and off and wear slip-on shoes. ? ?Thank you for allowing [place] to care for you! ?  -- [facility] ? ?

## 2021-07-16 NOTE — ED Provider Notes (Signed)
? ?[facility] ?Provider Note: Georgena Spurling, MD, [place] ? ?CSN: [id number] ?MRN: [id number] ?ARRIVAL: 07/16/21 at 2030 ?ROOM: DB016/DB016 ? ? ?CHIEF COMPLAINT  ?Shortness of Breath and Chest Pain ? ? ?HISTORY OF PRESENT ILLNESS  ?07/16/21 10:54 PM ?Todd [name] is a 61 y.o. male who is scheduled for a coronary catheterization tomorrow after [place] CT coronary on 07/09/2021 showed flow-limiting stenosis in the mid to distal LAD.  ? ?He has been having intermittent chest pain with shortness of breath for about a month.  It occurs episodically.  Nothing brings it on or makes it better.  The chest pain is precordial and left-sided.  He describes it as a pressure or squeezing like someone is squeezing her hand too hard.  He had another episode about 15 minutes prior to arrival to the ED.  He rated the pain as a 5 out of 10.  It resolved on its own and he is asymptomatic now.  It was associated with a sense of numbness in his left arm and shoulder but had no associated nausea or vomiting.  This is the worst episode he has had. ? ? ?Past Medical History:  ?Diagnosis Date  ? Complicated migraine   ? Diverticulosis   ? External hemorrhoids   ? External hemorrhoids   ? Headache   ? Hx of cardiovascular stress test   ? Myoview 8/10:  no ischemia, EF 62%; low risk  ? Hx of Doppler ultrasound   ? Carotid US 6/15:  bilat ICA 1-39%  ? Hx of echocardiogram   ? echo 6/15:  EF 60-65%, no RWMA  ? Internal hemorrhoids   ? Palpitations   ? Stroke [facility])   ? TIA (transient ischemic attack)   ? Tubular adenoma of colon   ? Vision abnormalities   ? ? ?Past Surgical History:  ?Procedure Laterality Date  ? APPENDECTOMY  12/01/11  ? lap appy  ? LAPAROSCOPIC APPENDECTOMY  12/02/2011  ? Procedure: APPENDECTOMY LAPAROSCOPIC;  Surgeon: Madilyn Hook, DO;  Location: [facility];  Service: General;  Laterality: N/A;  ? ? ?Family History  ?Problem Relation Age of Onset  ? Hypertension Mother   ? Hypertension Father   ? Anemia Neg Hx   ? Arrhythmia Neg Hx   ?  Asthma Neg Hx   ? Clotting disorder Neg Hx   ? Fainting Neg Hx   ? Heart attack Neg Hx   ? Heart disease Neg Hx   ? Heart failure Neg Hx   ? Hyperlipidemia Neg Hx   ? Colon cancer Neg Hx   ? Esophageal cancer Neg Hx   ? Stomach cancer Neg Hx   ? Rectal cancer Neg Hx   ? ? ?Social History  ? ?Tobacco Use  ? Smoking status: Never  ? Smokeless tobacco: Never  ?Vaping Use  ? Vaping Use: Never used  ?Substance Use Topics  ? Alcohol use: No  ? Drug use: No  ? ? ?Prior to Admission medications   ?Medication Sig Start Date End Date Taking? Authorizing Provider  ?metoprolol succinate (TOPROL XL) 25 MG 24 hr tablet Take 1 tablet (25 mg total) by mouth daily. 07/10/21   Loel Dubonnet, NP  ?nitroGLYCERIN (NITROSTAT) 0.4 MG SL tablet Place 1 tablet (0.4 mg total) under the tongue every 5 (five) minutes as needed for chest pain. 07/12/21 10/10/21  Loel Dubonnet, NP  ?rosuvastatin (CRESTOR) 5 MG tablet Take 1 tablet (5 mg total) by mouth daily. 06/14/21   Loel Dubonnet,  NP  ?terbinafine (LAMISIL) 1 % cream Apply 1 application topically 2 (two) times daily. 12/11/20   Wendie Agreste, MD  ? ? ?Allergies ?Bupropion and Wellbutrin [bupropion hcl] ? ? ?REVIEW OF SYSTEMS  ?Negative except as noted here or in the History of Present Illness. ? ? ?PHYSICAL EXAMINATION  ?Initial Vital Signs ?Blood pressure 122/86, pulse 62, temperature 98.2 ?F (36.8 ?C), resp. rate 19, height '5\' 7"'$  (1.702 m), weight 77.6 kg, SpO2 98 %. ? ?Examination ?General: Well-developed, well-nourished male in no acute distress; appearance consistent with age of record ?HENT: normocephalic; atraumatic ?Eyes: pupils equal, round and reactive to light; extraocular muscles intact ?Neck: supple ?Heart: regular rate and rhythm ?Lungs: clear to auscultation bilaterally ?Abdomen: soft; nondistended; nontender; bowel sounds present ?Extremities: No deformity; full range of motion; pulses normal ?Neurologic: Awake, alert and oriented; motor function intact in all  extremities and symmetric; no facial droop ?Skin: Warm and dry ?Psychiatric: Normal mood and affect ? ? ?RESULTS  ?Summary of this visit's results, reviewed and interpreted by myself: ? ? EKG Interpretation ? ?Date/Time:  Tuesday July 16 2021 20:43:30 EDT ?Ventricular Rate:  78 ?PR Interval:  158 ?QRS Duration: 90 ?QT Interval:  402 ?QTC Calculation: 458 ?R Axis:   5 ?Text Interpretation: Normal sinus rhythm Normal ECG No significant change was found Confirmed by Vannak Montenegro [phone]) on 07/16/2021 10:34:03 PM ?  ? ?  ? ?Laboratory Studies: ?Results for orders placed or performed during the hospital encounter of 07/16/21 (from the past 24 hour(s))  ?Basic metabolic panel     Status: Abnormal  ? Collection Time: 07/16/21  8:54 PM  ?Result Value Ref Range  ? Sodium 138 135 - 145 mmol/L  ? Potassium 3.7 3.5 - 5.1 mmol/L  ? Chloride 106 98 - 111 mmol/L  ? CO2 22 22 - 32 mmol/L  ? Glucose, Bld 119 (H) 70 - 99 mg/dL  ? BUN 18 6 - 20 mg/dL  ? Creatinine, Ser 1.10 0.61 - 1.24 mg/dL  ? Calcium 9.6 8.9 - 10.3 mg/dL  ? GFR, Estimated >60 >60 mL/min  ? Anion gap 10 5 - 15  ?CBC     Status: None  ? Collection Time: 07/16/21  8:54 PM  ?Result Value Ref Range  ? WBC 7.9 4.0 - 10.5 K/uL  ? RBC 5.20 4.22 - 5.81 MIL/uL  ? Hemoglobin 15.4 13.0 - 17.0 g/dL  ? HCT 44.9 39.0 - 52.0 %  ? MCV 86.3 80.0 - 100.0 fL  ? MCH 29.6 26.0 - 34.0 pg  ? MCHC 34.3 30.0 - 36.0 g/dL  ? RDW 12.2 11.5 - 15.5 %  ? Platelets 244 150 - 400 K/uL  ? nRBC 0.0 0.0 - 0.2 %  ?Troponin I (High Sensitivity)     Status: None  ? Collection Time: 07/16/21  8:54 PM  ?Result Value Ref Range  ? Troponin I (High Sensitivity) <2 <18 ng/L  ?Troponin I (High Sensitivity)     Status: None  ? Collection Time: 07/16/21 11:17 PM  ?Result Value Ref Range  ? Troponin I (High Sensitivity) <2 <18 ng/L  ? ?Imaging Studies: ?DG Chest 2 View ? ?Result Date: 07/16/2021 ?CLINICAL DATA:  Chest pain EXAM: CHEST - 2 VIEW COMPARISON:  07/12/2021 FINDINGS: The heart size and mediastinal contours  are within normal limits. Both lungs are clear. Mild scoliosis IMPRESSION: No active cardiopulmonary disease. Electronically Signed   By: Donavan Foil M.D.   On: 07/16/2021 21:12   ? ?ED COURSE and  MDM  ?Nursing notes, initial and subsequent vitals signs, including pulse oximetry, reviewed and interpreted by myself. ? ?Vitals:  ? 07/16/21 2227 07/16/21 2230 07/16/21 2300 07/16/21 2330  ?BP: 129/79 122/86 (!) 137/98 132/79  ?Pulse: 64 62 82 64  ?Resp: '15 19 14 19  '$ ?Temp:      ?SpO2: 100% 98% 99% 97%  ?Weight:      ?Height:      ? ?Medications - No data to display ? ?12:19 AM ?Patient asymptomatic.  Troponins are normal.  EKG is nonacute.  Patient has his cardiac catheterization scheduled for 11 AM tomorrow.  He was advised to follow-up or to return to the ED if symptoms recur. ? ? ?PROCEDURES  ?Procedures ? ? ?ED DIAGNOSES  ? ?  ICD-10-CM   ?1. Unstable angina (HCC)  I20.0   ?  ? ? ? ?  ?Maziyah Vessel, MD ?07/17/21 0021 ? ?

## 2021-07-16 NOTE — ED Triage Notes (Addendum)
Pt reports CP and SHOB he experienced today is the worst pain he has felt.   ?Denies N/V ?Left sided CP with radiation to left shoulder and neck and extreme weakness ? ?Pt states he is scheduled for catherization tomorrow for stent placement ?

## 2021-07-17 ENCOUNTER — Ambulatory Visit (HOSPITAL_COMMUNITY)
Admission: RE | Disposition: A | Discharge status: Home / Self Care | Payer: Self-pay | Source: Home / Self Care | Attending: Cardiovascular Disease

## 2021-07-17 ENCOUNTER — Ambulatory Visit (HOSPITAL_COMMUNITY)
Admission: RE | Admit: 2021-07-17 | Discharge: 2021-07-18 | Disposition: A | Discharge status: Home / Self Care | Payer: 59 | Attending: Cardiovascular Disease | Admitting: Cardiovascular Disease

## 2021-07-17 ENCOUNTER — Other Ambulatory Visit: Payer: Self-pay

## 2021-07-17 DIAGNOSIS — Z79899 Other long term (current) drug therapy: Secondary | ICD-10-CM | POA: Insufficient documentation

## 2021-07-17 DIAGNOSIS — R200 Anesthesia of skin: Secondary | ICD-10-CM | POA: Insufficient documentation

## 2021-07-17 DIAGNOSIS — F419 Anxiety disorder, unspecified: Secondary | ICD-10-CM | POA: Insufficient documentation

## 2021-07-17 DIAGNOSIS — I25118 Atherosclerotic heart disease of native coronary artery with other forms of angina pectoris: Secondary | ICD-10-CM

## 2021-07-17 DIAGNOSIS — I200 Unstable angina: Secondary | ICD-10-CM

## 2021-07-17 DIAGNOSIS — I25110 Atherosclerotic heart disease of native coronary artery with unstable angina pectoris: Secondary | ICD-10-CM | POA: Insufficient documentation

## 2021-07-17 DIAGNOSIS — K219 Gastro-esophageal reflux disease without esophagitis: Secondary | ICD-10-CM | POA: Insufficient documentation

## 2021-07-17 DIAGNOSIS — Z955 Presence of coronary angioplasty implant and graft: Secondary | ICD-10-CM | POA: Insufficient documentation

## 2021-07-17 DIAGNOSIS — I2510 Atherosclerotic heart disease of native coronary artery without angina pectoris: Secondary | ICD-10-CM

## 2021-07-17 DIAGNOSIS — E785 Hyperlipidemia, unspecified: Secondary | ICD-10-CM

## 2021-07-17 DIAGNOSIS — Z7982 Long term (current) use of aspirin: Secondary | ICD-10-CM | POA: Insufficient documentation

## 2021-07-17 HISTORY — PX: LEFT HEART CATH AND CORONARY ANGIOGRAPHY: CATH118249

## 2021-07-17 HISTORY — PX: CORONARY STENT INTERVENTION: CATH118234

## 2021-07-17 LAB — POCT ACTIVATED CLOTTING TIME: Activated Clotting Time: 335 seconds

## 2021-07-17 SURGERY — LEFT HEART CATH AND CORONARY ANGIOGRAPHY
Anesthesia: LOCAL

## 2021-07-17 MED ORDER — METOPROLOL SUCCINATE ER 25 MG PO TB24
25.0000 mg | ORAL_TABLET | Freq: Every day | ORAL | Status: DC
  Administered 2021-07-18: 25 mg via ORAL
  Filled 2021-07-17: qty 1

## 2021-07-17 MED ORDER — LABETALOL HCL 5 MG/ML IV SOLN: 10.0000 mg | INTRAVENOUS | Status: AC | PRN

## 2021-07-17 MED ORDER — NITROGLYCERIN 0.4 MG SL SUBL: 0.4000 mg | SUBLINGUAL_TABLET | SUBLINGUAL | Status: DC | PRN

## 2021-07-17 MED ORDER — ACETAMINOPHEN 325 MG PO TABS: 650.0000 mg | ORAL_TABLET | ORAL | Status: DC | PRN

## 2021-07-17 MED ORDER — SODIUM CHLORIDE 0.9% FLUSH
3.0000 mL | Freq: Two times a day (BID) | INTRAVENOUS | Status: DC
  Administered 2021-07-17: 3 mL via INTRAVENOUS

## 2021-07-17 MED ORDER — SODIUM CHLORIDE 0.9 % IV SOLN: INTRAVENOUS | Status: AC

## 2021-07-17 MED ORDER — SODIUM CHLORIDE 0.9% FLUSH: 3.0000 mL | INTRAVENOUS | Status: DC | PRN

## 2021-07-17 MED ORDER — HEPARIN (PORCINE) IN NACL 1000-0.9 UT/500ML-% IV SOLN
INTRAVENOUS | Status: DC | PRN
Start: 2021-07-17 — End: 2021-07-17
  Administered 2021-07-17 (×2): 500 mL

## 2021-07-17 MED ORDER — VERAPAMIL HCL 2.5 MG/ML IV SOLN
INTRAVENOUS | Status: DC | PRN
  Administered 2021-07-17: 10 mL via INTRA_ARTERIAL

## 2021-07-17 MED ORDER — NITROGLYCERIN 1 MG/10 ML FOR IR/CATH LAB
INTRA_ARTERIAL | Status: AC
  Filled 2021-07-17: qty 10

## 2021-07-17 MED ORDER — MIDAZOLAM HCL 2 MG/2ML IJ SOLN
INTRAMUSCULAR | Status: AC
  Filled 2021-07-17: qty 2

## 2021-07-17 MED ORDER — SODIUM CHLORIDE 0.9 % IV SOLN: 250.0000 mL | INTRAVENOUS | Status: DC | PRN

## 2021-07-17 MED ORDER — IOHEXOL 350 MG/ML SOLN
INTRAVENOUS | Status: DC | PRN
  Administered 2021-07-17: 130 mL via INTRA_ARTERIAL

## 2021-07-17 MED ORDER — SODIUM CHLORIDE 0.9 % WEIGHT BASED INFUSION
1.0000 mL/kg/h | INTRAVENOUS | Status: DC
  Administered 2021-07-17: 1 mL/kg/h via INTRAVENOUS

## 2021-07-17 MED ORDER — SODIUM CHLORIDE 0.9% FLUSH: 3.0000 mL | Freq: Two times a day (BID) | INTRAVENOUS | Status: DC

## 2021-07-17 MED ORDER — LIDOCAINE HCL (PF) 1 % IJ SOLN
INTRAMUSCULAR | Status: AC
  Filled 2021-07-17: qty 30

## 2021-07-17 MED ORDER — CLOPIDOGREL BISULFATE 75 MG PO TABS
75.0000 mg | ORAL_TABLET | Freq: Every day | ORAL | Status: DC
  Administered 2021-07-18: 75 mg via ORAL
  Filled 2021-07-17: qty 1

## 2021-07-17 MED ORDER — FENTANYL CITRATE (PF) 100 MCG/2ML IJ SOLN
INTRAMUSCULAR | Status: DC | PRN
  Administered 2021-07-17: 25 ug via INTRAVENOUS
  Administered 2021-07-17: 50 ug via INTRAVENOUS

## 2021-07-17 MED ORDER — FENTANYL CITRATE (PF) 100 MCG/2ML IJ SOLN
INTRAMUSCULAR | Status: AC
  Filled 2021-07-17: qty 2

## 2021-07-17 MED ORDER — CLOPIDOGREL BISULFATE 300 MG PO TABS
ORAL_TABLET | ORAL | Status: DC | PRN
  Administered 2021-07-17: 600 mg via ORAL

## 2021-07-17 MED ORDER — VERAPAMIL HCL 2.5 MG/ML IV SOLN
INTRAVENOUS | Status: AC
  Filled 2021-07-17: qty 2

## 2021-07-17 MED ORDER — FAMOTIDINE IN NACL 20-0.9 MG/50ML-% IV SOLN
INTRAVENOUS | Status: AC
  Filled 2021-07-17: qty 50

## 2021-07-17 MED ORDER — ONDANSETRON HCL 4 MG/2ML IJ SOLN: 4.0000 mg | Freq: Four times a day (QID) | INTRAMUSCULAR | Status: DC | PRN

## 2021-07-17 MED ORDER — LIDOCAINE HCL (PF) 1 % IJ SOLN
INTRAMUSCULAR | Status: DC | PRN
  Administered 2021-07-17: 2 mL

## 2021-07-17 MED ORDER — ASPIRIN 81 MG PO CHEW
81.0000 mg | CHEWABLE_TABLET | ORAL | Status: AC
  Administered 2021-07-17: 81 mg via ORAL

## 2021-07-17 MED ORDER — HYDRALAZINE HCL 20 MG/ML IJ SOLN: 10.0000 mg | INTRAMUSCULAR | Status: AC | PRN

## 2021-07-17 MED ORDER — FAMOTIDINE IN NACL 20-0.9 MG/50ML-% IV SOLN
INTRAVENOUS | Status: AC | PRN
  Administered 2021-07-17: 20 mg via INTRAVENOUS

## 2021-07-17 MED ORDER — ROSUVASTATIN CALCIUM 20 MG PO TABS
20.0000 mg | ORAL_TABLET | Freq: Every day | ORAL | Status: DC
  Administered 2021-07-18: 20 mg via ORAL
  Filled 2021-07-17: qty 1

## 2021-07-17 MED ORDER — CLOPIDOGREL BISULFATE 300 MG PO TABS
ORAL_TABLET | ORAL | Status: AC
  Filled 2021-07-17: qty 2

## 2021-07-17 MED ORDER — SODIUM CHLORIDE 0.9 % WEIGHT BASED INFUSION
3.0000 mL/kg/h | INTRAVENOUS | Status: DC
  Administered 2021-07-17: 3 mL/kg/h via INTRAVENOUS

## 2021-07-17 MED ORDER — ASPIRIN 81 MG PO CHEW: 81.0000 mg | CHEWABLE_TABLET | ORAL | Status: DC

## 2021-07-17 MED ORDER — MIDAZOLAM HCL 2 MG/2ML IJ SOLN
INTRAMUSCULAR | Status: DC | PRN
Start: 2021-07-17 — End: 2021-07-17
  Administered 2021-07-17: 1 mg via INTRAVENOUS
  Administered 2021-07-17: 2 mg via INTRAVENOUS

## 2021-07-17 MED ORDER — ASPIRIN 81 MG PO CHEW
81.0000 mg | CHEWABLE_TABLET | Freq: Every day | ORAL | Status: DC
  Administered 2021-07-18: 81 mg via ORAL
  Filled 2021-07-17: qty 1

## 2021-07-17 MED ORDER — HEPARIN (PORCINE) IN NACL 1000-0.9 UT/500ML-% IV SOLN
INTRAVENOUS | Status: AC
Start: 2021-07-17 — End: ?
  Filled 2021-07-17: qty 1000

## 2021-07-17 MED ORDER — HEPARIN SODIUM (PORCINE) 1000 UNIT/ML IJ SOLN
INTRAMUSCULAR | Status: DC | PRN
  Administered 2021-07-17: 4000 [IU] via INTRAVENOUS
  Administered 2021-07-17: 6000 [IU] via INTRAVENOUS

## 2021-07-17 SURGICAL SUPPLY — 17 items
BALL SAPPHIRE NC24 3.0X18 (BALLOONS) ×2
BALLN SAPPHIRE 2.0X15 (BALLOONS) ×2
BALLOON SAPPHIRE 2.0X15 (BALLOONS) IMPLANT
BALLOON SAPPHIRE NC24 3.0X18 (BALLOONS) IMPLANT
CATH 5FR JL3.5 JR4 ANG PIG MP (CATHETERS) ×1 IMPLANT
CATH VISTA GUIDE 6FR XBLAD3.5 (CATHETERS) ×1 IMPLANT
DEVICE RAD COMP TR BAND LRG (VASCULAR PRODUCTS) ×1 IMPLANT
GLIDESHEATH SLEND SS 6F .021 (SHEATH) ×1 IMPLANT
GUIDEWIRE INQWIRE 1.5J.035X260 (WIRE) IMPLANT
INQWIRE 1.5J .035X260CM (WIRE) ×2
KIT ENCORE 26 ADVANTAGE (KITS) ×1 IMPLANT
KIT HEART LEFT (KITS) ×2 IMPLANT
PACK CARDIAC CATHETERIZATION (CUSTOM PROCEDURE TRAY) ×2 IMPLANT
STENT ONYX FRONTIER 2.75X22 (Permanent Stent) ×1 IMPLANT
TRANSDUCER W/STOPCOCK (MISCELLANEOUS) ×2 IMPLANT
TUBING CIL FLEX 10 FLL-RA (TUBING) ×2 IMPLANT
WIRE COUGAR XT STRL 190CM (WIRE) ×1 IMPLANT

## 2021-07-17 NOTE — Interval H&P Note (Signed)
History and Physical Interval Note: ? ?07/17/2021 ?11:13 AM ? ?Ariq Khamis  has presented today for surgery, with the diagnosis of angina.  The various methods of treatment have been discussed with the patient and family. After consideration of risks, benefits and other options for treatment, the patient has consented to  Procedure(s): ?LEFT HEART CATH AND CORONARY ANGIOGRAPHY (N/A) as a surgical intervention.  The patient's history has been reviewed, patient examined, no change in status, stable for surgery.  I have reviewed the patient's chart and labs.  Questions were answered to the patient's satisfaction.   ? ?Cath Lab Visit (complete for each Cath Lab visit) ? ?Clinical Evaluation Leading to the Procedure:  ? ?ACS: No. ? ?Non-ACS:   ? ?Anginal Classification: CCS III ? ?Anti-ischemic medical therapy: Minimal Therapy (1 class of medications) ? ?Non-Invasive Test Results: High-risk stress test findings: cardiac mortality >3%/year (Coronary CTA with flow limiting LAD stenosis by FFR) ? ?Prior CABG: No previous CABG ? ? ? ? ? ? ? ?Lauree Chandler ? ? ?

## 2021-07-17 NOTE — Progress Notes (Signed)
Thanks Urban Gibson - I agree with definitive eval - will follow-up on cath today. ? ?-[place] ?

## 2021-07-17 NOTE — ED Notes (Signed)
Pt verbalizes understanding of discharge instructions. Opportunity for questioning and answers were provided. Pt discharged from ED to home.   ? ?

## 2021-07-17 NOTE — Discharge Instructions (Signed)
Follow-up with your cardiac catheterization at [facility] today as scheduled.  Return to [name] emergency department if chest pain recurs. ?

## 2021-07-18 ENCOUNTER — Other Ambulatory Visit (HOSPITAL_COMMUNITY): Payer: Self-pay

## 2021-07-18 ENCOUNTER — Encounter (HOSPITAL_COMMUNITY): Payer: Self-pay | Admitting: Cardiovascular Disease

## 2021-07-18 DIAGNOSIS — E782 Mixed hyperlipidemia: Secondary | ICD-10-CM | POA: Diagnosis not present

## 2021-07-18 DIAGNOSIS — E785 Hyperlipidemia, unspecified: Secondary | ICD-10-CM

## 2021-07-18 DIAGNOSIS — I25110 Atherosclerotic heart disease of native coronary artery with unstable angina pectoris: Secondary | ICD-10-CM

## 2021-07-18 DIAGNOSIS — I2510 Atherosclerotic heart disease of native coronary artery without angina pectoris: Secondary | ICD-10-CM

## 2021-07-18 DIAGNOSIS — Z955 Presence of coronary angioplasty implant and graft: Secondary | ICD-10-CM | POA: Diagnosis not present

## 2021-07-18 DIAGNOSIS — K219 Gastro-esophageal reflux disease without esophagitis: Secondary | ICD-10-CM | POA: Diagnosis not present

## 2021-07-18 DIAGNOSIS — I200 Unstable angina: Secondary | ICD-10-CM

## 2021-07-18 LAB — CBC
HCT: 39.6 % (ref 39.0–52.0)
Hemoglobin: 13.9 g/dL (ref 13.0–17.0)
MCH: 30.3 pg (ref 26.0–34.0)
MCHC: 35.1 g/dL (ref 30.0–36.0)
MCV: 86.5 fL (ref 80.0–100.0)
Platelets: 223 10*3/uL (ref 150–400)
RBC: 4.58 MIL/uL (ref 4.22–5.81)
RDW: 12 % (ref 11.5–15.5)
WBC: 9.5 10*3/uL (ref 4.0–10.5)
nRBC: 0 % (ref 0.0–0.2)

## 2021-07-18 LAB — BASIC METABOLIC PANEL
Anion gap: 8 (ref 5–15)
BUN: 12 mg/dL (ref 6–20)
CO2: 20 mmol/L — ABNORMAL LOW (ref 22–32)
Calcium: 8.3 mg/dL — ABNORMAL LOW (ref 8.9–10.3)
Chloride: 110 mmol/L (ref 98–111)
Creatinine, Ser: 1.05 mg/dL (ref 0.61–1.24)
GFR, Estimated: >60 mL/min (ref >60)
Glucose, Bld: 81 mg/dL (ref 70–99)
Potassium: 3.5 mmol/L (ref 3.5–5.1)
Sodium: 138 mmol/L (ref 135–145)

## 2021-07-18 MED ORDER — CLOPIDOGREL BISULFATE 75 MG PO TABS
75.0000 mg | ORAL_TABLET | Freq: Every day | ORAL | 2 refills | Status: DC
  Filled 2021-07-18: qty 90, 90d supply, fill #0

## 2021-07-18 MED ORDER — ROSUVASTATIN CALCIUM 20 MG PO TABS
20.0000 mg | ORAL_TABLET | Freq: Every day | ORAL | 5 refills | Status: DC
  Filled 2021-07-18: qty 30, 30d supply, fill #0

## 2021-07-18 MED FILL — Nitroglycerin IV Soln 100 MCG/ML in D5W: INTRA_ARTERIAL | Qty: 10 | Status: AC

## 2021-07-18 NOTE — Discharge Instructions (Signed)
Medication Changes: ?- START Plavix '75mg'$  daily in addition to the Aspirin '81mg'$  daily which your are already taking. These medications are very important in helping keep the stent in your heart open. ?- INCREASE Rosuvastatin (Crestor) to '20mg'$  daily. ?- Please continue all other home medications as directed. ? ?Post Cardiac Catheterization: ?NO HEAVY LIFTING OR SEXUAL ACTIVITY X 7 DAYS. ?NO DRIVING X 3-5 DAYS. ?NO SOAKING BATHS, HOT TUBS, POOLS, ETC., X 7 DAYS. ? ?Radial Site Care: ?Refer to this sheet in the next few weeks. These instructions provide you with information on caring for yourself after your procedure. Your caregiver may also give you more specific instructions. Your treatment has been planned according to current medical practices, but problems sometimes occur. Call your caregiver if you have any problems or questions after your procedure. ?HOME CARE INSTRUCTIONS ?You may shower the day after the procedure. Remove the bandage (dressing) and gently wash the site with plain soap and water. Gently pat the site dry.  ?Do not apply powder or lotion to the site.  ?Do not submerge the affected site in water for 3 to 5 days.  ?Inspect the site at least twice daily.  ?Do not flex or bend the affected arm for 24 hours.  ?No lifting over 5 pounds (2.3 kg) for 5 days after your procedure.  ?Do not drive home if you are discharged the same day of the procedure. Have someone else drive you.  ?What to expect: ?Any bruising will usually fade within 1 to 2 weeks.  ?Blood that collects in the tissue (hematoma) may be painful to the touch. It should usually decrease in size and tenderness within 1 to 2 weeks.  ?SEEK IMMEDIATE MEDICAL CARE IF: ?You have unusual pain at the radial site.  ?You have redness, warmth, swelling, or pain at the radial site.  ?You have drainage (other than a small amount of blood on the dressing).  ?You have chills.  ?You have a fever or persistent symptoms for more than 72 hours.  ?You have a  fever and your symptoms suddenly get worse.  ?Your arm becomes pale, cool, tingly, or numb.  ?You have heavy bleeding from the site. Hold pressure on the site.  ? ?

## 2021-07-18 NOTE — Discharge Summary (Signed)
?Discharge Summary  ?  ?Patient ID: Todd [name] ?MRN: [id number]; DOB: 06/04/1960 ? ?Admit date: 07/17/2021 ?Discharge date: 07/18/2021 ? ?PCP:  Wendie Agreste, MD ?  ?[facility] Providers ?Cardiologist:  Pixie Casino, MD ? ?Discharge Diagnoses  ?  ?Principal Problem: ?  Unstable angina ([place]) ?Active Problems: ?  CAD (coronary artery disease) ?  Hyperlipidemia ? ? ? ?Diagnostic Studies/Procedures  ?  ?Left Cardiac Catheterization 07/17/2021: ?  Prox RCA lesion is 30% stenosed. ?  1st Mrg lesion is 30% stenosed. ?  Mid LAD lesion is 80% stenosed. ?  A drug-eluting stent was successfully placed using a [place] 2.75X22. ?  Post intervention, there is a 0% residual stenosis. ?  ?Severe mid LAD stenosis ?Mild non-obstructive disease in the mid RCA and first obtuse marginal branch ?Successful PTCA/DES x 1 mid LAD ?  ?Recommendations: Continue DAPT with ASA/Plavix for at least six months. Will continue beta blocker and increase Crestor to 20 mg daily. Pt will be watched overnight tonight on telemetry.  ? ?Diagnostic ?Dominance: Right ? ?Intervention ? ? ? ?_____________ ?  ?History of Present Illness   ?  ?Todd [name] is a 61 y.o. male with with a history of atypical chest pain with CAD noted on recent coronary CTA, hyperlipidemia, CVA, GERD, and migraines. Patient had recent coronary CTA on 07/09/2021 which showed coronary calcium score of 370 (45th percentile for age and sex) with mild (25-49%) soft plaque with a focal moderate stenosis (50-69%) right below the soft plaque. FFR came back positive at 0.75 and 0.71 at the mid and distal LAD respectively suggesting flow limiting stenosis. He was seen for follow-up by Laurann Montana, NP, on 07/16/2021 at which time he continued to report intermittent chest pain both at rest and with activity. Outpatient cardiac catheterization was arranged. ? ?Hospital Course  ?   ?Consultants: None  ? ?Unstable Angina ?CAD ?Patient presented to [facility] on 07/17/2021 for  planned outpatient cardiac catheterization. LHC showed a 80% stenosis of the mid LAD with otherwise only mild disease in the proximal RCA and OM1. He underwent successful PTCA/DES to the mid LAD lesion. He tolerated the procedure well. He denies any recurrent chest pain and states he feels his breathing has improved as well. Will have him ambulate with Cardiac Rehab prior to discharge. Will plan for DAPT with Aspirin and Plavix for at least 6 months. Will continue home Toprol-XL '25mg'$  daily. Crestor increased to '20mg'$  daily. Will need repeat lipid panel and LFTs in 6-8 weeks. ? ?Hyperlipidemia ?Last lipid panel in 10/2020: Total Cholesterol 238, Triglycerides 123, HDL 46.4, LDL 167. LDL goal <70. Home Crestor was increased from '5mg'$  to '20mg'$  daily. Will need repeat lipid panel and LFTs in 6-8 weeks. ? ?Patient seen and examined by Dr. Debara Pickett and determined to be stable for discharge. Outpatient follow-up already arranged. Medications as below. ? ?Did the patient have an acute coronary syndrome (MI, NSTEMI, STEMI, etc) this admission?:  No                               ?Did the patient have a percutaneous coronary intervention (stent / angioplasty)?:  Yes.   ? ? ?Cath/PCI Registry Performance & Quality Measures: ?Aspirin prescribed? - Yes ?ADP Receptor Inhibitor (Plavix/Clopidogrel, Brilinta/Ticagrelor or Effient/Prasugrel) prescribed (includes medically managed patients)? - Yes ?High Intensity Statin (Lipitor 40-'80mg'$  or Crestor 20-'40mg'$ ) prescribed? - Yes ?For EF <40%, was ACEI/ARB prescribed? - Not  Applicable (EF >/= 22%) - No LV gram was performed but EF normal on last Echo in 2016 ?For EF <40%, Aldosterone Antagonist (Spironolactone or Eplerenone) prescribed? - Not Applicable (EF >/= 97%) - No LV gram was performed but EF normal on last Echo in 2016 ?Cardiac Rehab Phase II ordered? - Yes  ?_____________ ? ?Discharge Vitals ?Blood pressure 116/71, pulse 63, temperature 97.8 ?F (36.6 ?C), temperature source Oral, resp.  rate 16, height '5\' 7"'$  (1.702 m), weight 77.6 kg, SpO2 96 %.  ?Filed Weights  ? 07/17/21 1057  ?Weight: 77.6 kg  ? ?General: 61 y.o. male resting comfortably in no acute distress. ?HEENT: Normocephalic and atraumatic. Sclera clear.  ?Neck: Supple. No JVD. ?Heart: Borderline bradycardia with normal rhythm. No murmurs, gallops, or rubs. Radial pulses 2+ and equal bilaterally. Right radial cath site soft with no signs of hematoma. ?Lungs: No increased work of breathing. Clear to ausculation bilaterally. No wheezes, rhonchi, or rales.  ?Abdomen: Soft, non-distended, and non-tender to palpation.   ?Extremities: Trace lower extremity edema bilaterally.    ?Skin: Warm and dry. ?Neuro: Alert and oriented x3. No focal deficits. ?Psych: Normal affect. Responds appropriately. ? ?Labs & Radiologic Studies  ?  ?CBC ?Recent Labs  ?  07/16/21 ?2054 07/18/21 ?0310  ?WBC 7.9 9.5  ?HGB 15.4 13.9  ?HCT 44.9 39.6  ?MCV 86.3 86.5  ?PLT 244 223  ? ?Basic Metabolic Panel ?Recent Labs  ?  07/16/21 ?2054 07/18/21 ?0310  ?NA 138 138  ?K 3.7 3.5  ?CL 106 110  ?CO2 22 20*  ?GLUCOSE 119* 81  ?BUN 18 12  ?CREATININE 1.10 1.05  ?CALCIUM 9.6 8.3*  ? ?Liver Function Tests ?No results for input(s): AST, ALT, ALKPHOS, BILITOT, PROT, ALBUMIN in the last 72 hours. ?No results for input(s): LIPASE, AMYLASE in the last 72 hours. ?High Sensitivity Troponin:   ?Recent Labs  ?Lab 07/12/21 ?0102 07/12/21 ?0305 07/16/21 ?2054 07/16/21 ?2317  ?TROPONINIHS 2 2 <2 <2  ?  ?BNP ?Invalid input(s): POCBNP ?D-Dimer ?No results for input(s): DDIMER in the last 72 hours. ?Hemoglobin A1C ?No results for input(s): HGBA1C in the last 72 hours. ?Fasting Lipid Panel ?No results for input(s): CHOL, HDL, LDLCALC, TRIG, CHOLHDL, LDLDIRECT in the last 72 hours. ?Thyroid Function Tests ?No results for input(s): TSH, T4TOTAL, T3FREE, THYROIDAB in the last 72 hours. ? ?Invalid input(s): FREET3 ?_____________  ?DG Chest 2 View ? ?Result Date: 07/16/2021 ?CLINICAL DATA:  Chest pain  EXAM: CHEST - 2 VIEW COMPARISON:  07/12/2021 FINDINGS: The heart size and mediastinal contours are within normal limits. Both lungs are clear. Mild scoliosis IMPRESSION: No active cardiopulmonary disease. Electronically Signed   By: Donavan Foil M.D.   On: 07/16/2021 21:12  ? ?DG Chest 2 View ? ?Result Date: 07/12/2021 ?CLINICAL DATA:  Chest pain EXAM: CHEST - 2 VIEW COMPARISON:  06/12/2021 FINDINGS: The heart size and mediastinal contours are within normal limits. Both lungs are clear. The visualized skeletal structures are unremarkable. IMPRESSION: No active cardiopulmonary disease. Electronically Signed   By: Ulyses Jarred M.D.   On: 07/12/2021 01:18  ? ?CARDIAC CATHETERIZATION ? ?Result Date: 07/17/2021 ?  Prox RCA lesion is 30% stenosed.   1st Mrg lesion is 30% stenosed.   Mid LAD lesion is 80% stenosed.   A drug-eluting stent was successfully placed using a STENT ONYX FRONTIER 2.75X22.   Post intervention, there is a 0% residual stenosis. Severe mid LAD stenosis Mild non-obstructive disease in the mid RCA and first obtuse marginal branch  Successful PTCA/DES x 1 mid LAD Recommendations: Continue DAPT with ASA/Plavix for at least six months. Will continue beta blocker and increase Crestor to 20 mg daily. Pt will be watched overnight tonight on telemetry.  ? ?CT CORONARY MORPH W/CTA COR W/SCORE W/CA W/CM &/OR WO/CM ? ?Addendum Date: 07/09/2021   ?ADDENDUM REPORT: 07/09/2021 16:16 EXAM: OVER-READ INTERPRETATION  CT CHEST The following report is an over-read performed by radiologist Dr. [facility] on 07/09/2021. This over-read does not include interpretation of cardiac or coronary anatomy or pathology. The cardiac CTA interpretation by the cardiologist is attached. COMPARISON:  None. FINDINGS: No significant extracardiac vascular findings. Normal caliber thoracic aorta. No dissection. No mediastinal or hilar mass or lymphadenopathy. The visualized esophagus is grossly normal. There is a  small hiatal hernia. The lungs are clear of an acute process. No worrisome pulmonary lesions or pulmonary nodules. No significant bony findings. IMPRESSION: No significant extracardiac findings. Electronicall

## 2021-07-18 NOTE — Progress Notes (Signed)
CARDIAC REHAB PHASE I  ? ?PRE:  Rate/Rhythm: 60 SR ? ?  BP: sitting 116/71 ? ?  SaO2:  ? ?MODE:  Ambulation: 430 ft  ? ?POST:  Rate/Rhythm: 88 SR ? ?  BP: sitting 132/79  ? ?  SaO2: 99 RA ? ?Pt denied CP but does endorse feeling SOB every 15 seconds while walking, "like I need to take a deeper breath". Resolved with rest.  ? ?Discussed with pt stent, importance of Plavix, diet, exercise, NTG, and CRPII. Pt receptive. Will refer to [place].  ?[id number] ? ?Yves Dill CES, ACSM ?07/18/2021 ?9:21 AM ? ? ? ? ?

## 2021-07-20 ENCOUNTER — Emergency Department (HOSPITAL_BASED_OUTPATIENT_CLINIC_OR_DEPARTMENT_OTHER): Payer: 59

## 2021-07-20 ENCOUNTER — Other Ambulatory Visit: Payer: Self-pay

## 2021-07-20 ENCOUNTER — Observation Stay (HOSPITAL_BASED_OUTPATIENT_CLINIC_OR_DEPARTMENT_OTHER)
Admission: EM | Admit: 2021-07-20 | Discharge: 2021-07-21 | Disposition: A | Discharge status: Home / Self Care | Payer: 59 | Attending: Cardiology | Admitting: Cardiology

## 2021-07-20 ENCOUNTER — Encounter (HOSPITAL_BASED_OUTPATIENT_CLINIC_OR_DEPARTMENT_OTHER): Payer: Self-pay | Admitting: Emergency Medicine

## 2021-07-20 DIAGNOSIS — I25110 Atherosclerotic heart disease of native coronary artery with unstable angina pectoris: Secondary | ICD-10-CM | POA: Insufficient documentation

## 2021-07-20 DIAGNOSIS — I214 Non-ST elevation (NSTEMI) myocardial infarction: Secondary | ICD-10-CM | POA: Diagnosis not present

## 2021-07-20 DIAGNOSIS — Z7982 Long term (current) use of aspirin: Secondary | ICD-10-CM | POA: Diagnosis not present

## 2021-07-20 DIAGNOSIS — E785 Hyperlipidemia, unspecified: Secondary | ICD-10-CM | POA: Diagnosis not present

## 2021-07-20 DIAGNOSIS — Z79899 Other long term (current) drug therapy: Secondary | ICD-10-CM | POA: Insufficient documentation

## 2021-07-20 DIAGNOSIS — Z8673 Personal history of transient ischemic attack (TIA), and cerebral infarction without residual deficits: Secondary | ICD-10-CM | POA: Insufficient documentation

## 2021-07-20 DIAGNOSIS — I200 Unstable angina: Secondary | ICD-10-CM

## 2021-07-20 DIAGNOSIS — R079 Chest pain, unspecified: Secondary | ICD-10-CM | POA: Diagnosis present

## 2021-07-20 DIAGNOSIS — R0602 Shortness of breath: Secondary | ICD-10-CM

## 2021-07-20 DIAGNOSIS — I2510 Atherosclerotic heart disease of native coronary artery without angina pectoris: Secondary | ICD-10-CM | POA: Diagnosis present

## 2021-07-20 LAB — COMPREHENSIVE METABOLIC PANEL
ALT: 25 U/L (ref 0–44)
AST: 26 U/L (ref 15–41)
Albumin: 4 g/dL (ref 3.5–5.0)
Alkaline Phosphatase: 55 U/L (ref 38–126)
Anion gap: 8 (ref 5–15)
BUN: 17 mg/dL (ref 6–20)
CO2: 22 mmol/L (ref 22–32)
Calcium: 9 mg/dL (ref 8.9–10.3)
Chloride: 107 mmol/L (ref 98–111)
Creatinine, Ser: 1.03 mg/dL (ref 0.61–1.24)
GFR, Estimated: >60 mL/min (ref >60)
Glucose, Bld: 108 mg/dL — ABNORMAL HIGH (ref 70–99)
Potassium: 3.2 mmol/L — ABNORMAL LOW (ref 3.5–5.1)
Sodium: 137 mmol/L (ref 135–145)
Total Bilirubin: 1.4 mg/dL — ABNORMAL HIGH (ref 0.3–1.2)
Total Protein: 7.5 g/dL (ref 6.5–8.1)

## 2021-07-20 LAB — CBC
HCT: 42.8 % (ref 39.0–52.0)
Hemoglobin: 15.2 g/dL (ref 13.0–17.0)
MCH: 30.5 pg (ref 26.0–34.0)
MCHC: 35.5 g/dL (ref 30.0–36.0)
MCV: 85.9 fL (ref 80.0–100.0)
Platelets: 244 10*3/uL (ref 150–400)
RBC: 4.98 MIL/uL (ref 4.22–5.81)
RDW: 12.1 % (ref 11.5–15.5)
WBC: 9.4 10*3/uL (ref 4.0–10.5)
nRBC: 0 % (ref 0.0–0.2)

## 2021-07-20 LAB — CBC WITH DIFFERENTIAL/PLATELET
Abs Immature Granulocytes: 0.07 10*3/uL (ref 0.00–0.07)
Basophils Absolute: 0.1 10*3/uL (ref 0.0–0.1)
Basophils Relative: 1 %
Eosinophils Absolute: 0.2 10*3/uL (ref 0.0–0.5)
Eosinophils Relative: 2 %
HCT: 42.2 % (ref 39.0–52.0)
Hemoglobin: 15.3 g/dL (ref 13.0–17.0)
Immature Granulocytes: 1 %
Lymphocytes Relative: 28 %
Lymphs Abs: 2.5 10*3/uL (ref 0.7–4.0)
MCH: 30.4 pg (ref 26.0–34.0)
MCHC: 36.3 g/dL — ABNORMAL HIGH (ref 30.0–36.0)
MCV: 83.7 fL (ref 80.0–100.0)
Monocytes Absolute: 0.8 10*3/uL (ref 0.1–1.0)
Monocytes Relative: 9 %
Neutro Abs: 5.4 10*3/uL (ref 1.7–7.7)
Neutrophils Relative %: 60 %
Platelets: 272 10*3/uL (ref 150–400)
RBC: 5.04 MIL/uL (ref 4.22–5.81)
RDW: 12 % (ref 11.5–15.5)
WBC: 8.9 10*3/uL (ref 4.0–10.5)

## 2021-07-20 LAB — TROPONIN I (HIGH SENSITIVITY)
Troponin I (High Sensitivity): 504 ng/L (ref <18)
Troponin I (High Sensitivity): 404 ng/L (ref <18)

## 2021-07-20 LAB — CREATININE, SERUM
Creatinine, Ser: 0.98 mg/dL (ref 0.61–1.24)
GFR, Estimated: >60 mL/min (ref >60)

## 2021-07-20 MED ORDER — CLOPIDOGREL BISULFATE 75 MG PO TABS
75.0000 mg | ORAL_TABLET | Freq: Every day | ORAL | Status: DC
  Administered 2021-07-21: 75 mg via ORAL
  Filled 2021-07-20: qty 1

## 2021-07-20 MED ORDER — ONDANSETRON HCL 4 MG/2ML IJ SOLN: 4.0000 mg | Freq: Four times a day (QID) | INTRAMUSCULAR | Status: DC | PRN

## 2021-07-20 MED ORDER — CLOPIDOGREL BISULFATE 75 MG PO TABS
75.0000 mg | ORAL_TABLET | Freq: Once | ORAL | Status: AC
  Administered 2021-07-20: 75 mg via ORAL
  Filled 2021-07-20: qty 1

## 2021-07-20 MED ORDER — ASPIRIN EC 81 MG PO TBEC
81.0000 mg | DELAYED_RELEASE_TABLET | Freq: Every day | ORAL | Status: DC
  Administered 2021-07-21: 81 mg via ORAL
  Filled 2021-07-20: qty 1

## 2021-07-20 MED ORDER — POTASSIUM CHLORIDE CRYS ER 20 MEQ PO TBCR
40.0000 meq | EXTENDED_RELEASE_TABLET | Freq: Once | ORAL | Status: AC
Start: 2021-07-20 — End: 2021-07-20
  Administered 2021-07-20: 40 meq via ORAL
  Filled 2021-07-20: qty 2

## 2021-07-20 MED ORDER — NITROGLYCERIN 0.4 MG SL SUBL: 0.4000 mg | SUBLINGUAL_TABLET | SUBLINGUAL | Status: DC | PRN

## 2021-07-20 MED ORDER — ASPIRIN 81 MG PO CHEW
324.0000 mg | CHEWABLE_TABLET | Freq: Once | ORAL | Status: AC
Start: 2021-07-20 — End: 2021-07-20
  Administered 2021-07-20: 324 mg via ORAL
  Filled 2021-07-20: qty 4

## 2021-07-20 MED ORDER — ROSUVASTATIN CALCIUM 20 MG PO TABS
20.0000 mg | ORAL_TABLET | Freq: Every day | ORAL | Status: DC
  Administered 2021-07-20: 20 mg via ORAL
  Filled 2021-07-20: qty 1

## 2021-07-20 MED ORDER — METOPROLOL SUCCINATE ER 25 MG PO TB24
25.0000 mg | ORAL_TABLET | Freq: Every day | ORAL | Status: DC
  Administered 2021-07-21: 25 mg via ORAL
  Filled 2021-07-20: qty 1

## 2021-07-20 MED ORDER — ACETAMINOPHEN 325 MG PO TABS: 650.0000 mg | ORAL_TABLET | ORAL | Status: DC | PRN

## 2021-07-20 MED ORDER — ENOXAPARIN SODIUM 40 MG/0.4ML IJ SOSY
40.0000 mg | PREFILLED_SYRINGE | INTRAMUSCULAR | Status: DC
  Filled 2021-07-20: qty 0.4

## 2021-07-20 NOTE — ED Provider Notes (Signed)
?[facility] DEPARTMENT ?Provider Note ? ? ?CSN: [id number] ?Arrival date & time: 07/20/21  0343 ? ?  ? ?History ? ?Chief Complaint  ?Patient presents with  ? Chest Pain  ? ? ?Todd [name] is a 61 y.o. male. ? ?The history is provided by the patient.  ?Chest Pain ?Pain location:  Substernal area and L chest ?Pain quality: pressure   ?Pain radiates to:  L arm ?Pain severity:  Moderate ?Onset quality:  Gradual ?Timing:  Constant ?Progression:  Resolved ?Chronicity:  Recurrent ?Associated symptoms: fatigue and shortness of breath   ?Associated symptoms: no fever   ?Patient reports about 2 hours ago been having left-sided chest pain and pressure and shortness of breath.  It is since resolved.  Patient just had a cardiac stent placed.  He reports his pain feels similar to prior episodes but is not as severe ?He reports compliance with aspirin and Plavix ?  ? ?Home Medications ?Prior to Admission medications   ?Medication Sig Start Date End Date Taking? Authorizing Provider  ?aspirin EC 81 MG tablet Take 81 mg by mouth once. Swallow whole.    [provider]  ?clopidogrel (PLAVIX) 75 MG tablet Take 1 tablet (75 mg total) by mouth daily with breakfast. 07/18/21   Sande Rives E, PA-C  ?metoprolol succinate (TOPROL XL) 25 MG 24 hr tablet Take 1 tablet (25 mg total) by mouth daily. 07/10/21   Loel Dubonnet, NP  ?nitroGLYCERIN (NITROSTAT) 0.4 MG SL tablet Place 1 tablet (0.4 mg total) under the tongue every 5 (five) minutes as needed for chest pain. 07/12/21 10/10/21  Loel Dubonnet, NP  ?rosuvastatin (CRESTOR) 20 MG tablet Take 1 tablet (20 mg total) by mouth daily. 07/18/21   Sande Rives E, PA-C  ?terbinafine (LAMISIL) 1 % cream Apply 1 application topically 2 (two) times daily. 12/11/20   Wendie Agreste, MD  ?   ? ?Allergies    ?Wellbutrin [bupropion hcl]   ? ?Review of Systems   ?Review of Systems  ?Constitutional:  Positive for fatigue. Negative for fever.  ?Respiratory:  Positive  for shortness of breath.   ?Cardiovascular:  Positive for chest pain.  ? ?Physical Exam ?Updated Vital Signs ?BP 128/78   Pulse 65   Temp 98.3 ?F (36.8 ?C) (Axillary)   Resp 17   Ht 1.702 m ('5\' 7"'$ )   Wt 77.6 kg   SpO2 100%   BMI 26.79 kg/m?  ?Physical Exam ?CONSTITUTIONAL: Well developed/well nourished ?HEAD: Normocephalic/atraumatic ?EYES: EOMI/PERRL ?ENMT: Mucous membranes moist ?NECK: supple no meningeal signs ?SPINE/BACK:entire spine nontender ?CV: S1/S2 noted, no murmurs/rubs/gallops noted ?LUNGS: Lungs are clear to auscultation bilaterally, no apparent distress ?ABDOMEN: soft, nontender ?NEURO: Pt is awake/alert/appropriate, moves all extremitiesx4.  No facial droop.   ?EXTREMITIES: pulses normal/equalx4, full ROM, cath site well-healed on right wrist ?SKIN: warm, color normal ?PSYCH: no abnormalities of mood noted, alert and oriented to situation ? ?ED Results / Procedures / Treatments   ?Labs ?(all labs ordered are listed, but only abnormal results are displayed) ?Labs Reviewed  ?CBC WITH DIFFERENTIAL/PLATELET - Abnormal; Notable for the following components:  ?    Result Value  ? MCHC 36.3 (*)   ? All other components within normal limits  ?COMPREHENSIVE METABOLIC PANEL - Abnormal; Notable for the following components:  ? Potassium 3.2 (*)   ? Glucose, Bld 108 (*)   ? Total Bilirubin 1.4 (*)   ? All other components within normal limits  ?TROPONIN I (HIGH SENSITIVITY) - Abnormal;  Notable for the following components:  ? Troponin I (High Sensitivity) 504 (*)   ? All other components within normal limits  ?TROPONIN I (HIGH SENSITIVITY)  ? ? ?EKG ?ED ECG REPORT ? ? Date: 07/20/2021 0349 ? Rate: 77 ? Rhythm: normal sinus rhythm ? QRS Axis: normal ? Intervals: normal ? ST/T Wave abnormalities: normal ? Conduction Disutrbances:none ? Narrative Interpretation:  ? Old EKG Reviewed: unchanged ? ?I have personally reviewed the EKG tracing and agree with the computerized printout as noted. ? ?Radiology ?DG  Chest Portable 1 View ? ?Result Date: 07/20/2021 ?CLINICAL DATA:  Left-sided chest pain and shortness of breath. EXAM: PORTABLE CHEST 1 VIEW COMPARISON:  07/16/2021 FINDINGS: The lungs are clear without focal pneumonia, edema, pneumothorax or pleural effusion. The cardiopericardial silhouette is within normal limits for size. The visualized bony structures of the thorax are unremarkable. Telemetry leads overlie the chest. IMPRESSION: No active disease. Electronically Signed   By: Misty Stanley M.D.   On: 07/20/2021 04:34   ? ?Procedures ?Marland KitchenCritical Care ?Performed by: Ripley Fraise, MD ?Authorized by: Ripley Fraise, MD  ? ?Critical care provider statement:  ?  Critical care time (minutes):  33 ?  Critical care start time:  07/20/2021 5:30 AM ?  Critical care end time:  07/20/2021 6:03 AM ?  Critical care time was exclusive of:  Separately billable procedures and treating other patients ?  Critical care was necessary to treat or prevent imminent or life-threatening deterioration of the following conditions:  Cardiac failure and circulatory failure ?  Critical care was time spent personally by me on the following activities:  Development of treatment plan with patient or surrogate, pulse oximetry, ordering and review of radiographic studies, ordering and review of laboratory studies, re-evaluation of patient's condition, ordering and performing treatments and interventions, examination of patient and review of old charts ?  I assumed direction of critical care for this patient from another provider in my specialty: no   ?  Care discussed with: admitting provider    ? ? ?Medications Ordered in ED ?Medications  ?aspirin chewable tablet 324 mg (has no administration in time range)  ?clopidogrel (PLAVIX) tablet 75 mg (has no administration in time range)  ?nitroGLYCERIN (NITROSTAT) SL tablet 0.4 mg (has no administration in time range)  ?potassium chloride SA (KLOR-CON M) CR tablet 40 mEq (40 mEq Oral Given 07/20/21  0601)  ? ? ?ED Course/ Medical Decision Making/ A&P ?Clinical Course as of 07/20/21 0609  ?Sat Jul 20, 2021  ?0354 Troponin I (High Sensitivity)(!!): 504 ?Non-STEMI noted, will consult cardiology [DW]  ?0559 Discussed with cardiology fellow Dr. Marcelle Smiling.  We reviewed recent cardiac cath as well as labs.  Due to non-STEMI patient will be transferred to [facility].  Defer any further meds at this time if he is pain-free unless his troponin continues to escalate [DW]  ?0609 Patient reports he had a brief episode of chest pain that has since resolved.  He reports he is due for his aspirin and Plavix today.  This will be ordered [DW]  ?  ?Clinical Course User Index ?[DW] Ripley Fraise, MD  ? ?                        ?Medical Decision Making ?Amount and/or Complexity of Data Reviewed ?Labs: ordered. Decision-making details documented in ED Course. ?Radiology: ordered. ? ?Risk ?OTC drugs. ?Prescription drug management. ?Decision regarding hospitalization. ? ? ?This patient presents to the ED for concern of  chest pain, this involves an extensive number of treatment options, and is a complaint that carries with it a high risk of complications and morbidity.  The differential diagnosis includes but is not limited to ACS, pulmonary embolism, aortic dissection, stent thrombosis, pneumonia, pneumothorax ? ?Comorbidities that complicate the patient evaluation: ?Patient?s presentation is complicated by their history of coronary artery disease ? ?Social Determinants of Health: ?Patient?s  frequent ER visit   increases the complexity of managing their presentation ? ?Additional history obtained: ?Records reviewed previous admission documents ? ?Lab Tests: ?I Ordered, and personally interpreted labs.  The pertinent results include:  elevated troponin ? ?Imaging Studies ordered: ?I ordered imaging studies including X-ray chest   ?I independently visualized and interpreted imaging which showed no acute finding ?I agree with the  radiologist interpretation ? ?Cardiac Monitoring: ?The patient was maintained on a cardiac monitor.  I personally viewed and interpreted the cardiac monitor which showed an underlying rhythm of:  sinus rhythm ? ?Medicine

## 2021-07-20 NOTE — H&P (Addendum)
?Cardiology Admission History and Physical:  ? ?Patient ID: Todd [name] ?MRN: [id number]; DOB: Feb 03, 1961  ? ?Admission date: 07/20/2021 ? ?PCP:  Wendie Agreste, MD ?  ?[facility] Providers ?Cardiologist:  Pixie Casino, MD  ?Chief Complaint: Chest pain ? ?Patient Profile:  ? ?Todd [name] is a 61 y.o. male with PMH of CAD s/p recent PCI/DES to LAD 07/17/2021, HLD, CVA, GERD, and migraines who is being seen 07/20/2021 for the evaluation of chest pain. ? ?History of Present Illness:  ? ?Mr. Suchan recently underwent cardiac catheterization with PCI/DES to LAD for management of 80% stenosis with additional 30% first marginal and 30% proximal RCA stenosis which were medically managed.  He was recommended to continue aspirin/Plavix for at least 6 months and increase his Crestor to 20 mg daily.  He was monitored overnight and discharged home 07/18/2021 the following day.  His cardiac work-up was prompted by atypical chest pain for which she underwent a coronary CTA revealing moderate CAD in the LAD which ultimately led to his heart catheterization as above. ? ?He returned to [facility] ED early 07/20/2021 with complaints of left-sided chest pain for approximately 2 hours with associated shortness of breath.  He reported episode of chest pain felt similar to prior episodes before.  He reports compliance with his aspirin and Plavix. EKG shows sinus rhythm, rate 77 BPM, no STE/D, overall unchanged from previous.  CXR without acute findings.  Labs notable for K3.2, creatinine 1.03, CBC WNL, HsTrop 504> 404.  Vitals with mildly elevated blood pressure, otherwise WNL.  He was given 40 mEq potassium in the ED as well as 324 mg aspirin and home Plavix 75 mg.  Decision made to transfer patient to [facility] for cardiology evaluation. ? ?At the time of this evaluation he is chest pain-free.  He reported feeling fine yesterday, was able to walk short distances without chest pain or significant shortness of breath.  Around 2 AM this  morning he reported having sharp chest pain which moved around his chest.  He noticed acute worsening of his shortness of breath prompting EMS activation.  At this time shortness of breath appears to be his primary complaint.  This has been going on for about year-he notices shortness of breath with walking short distances, eating, and talking.  He denies orthopnea, PND, lower extremity edema, or weight gain.  He denies palpitations, syncope, abdominal pain, nausea, vomiting, early satiety. ? ?Past Medical History:  ?Diagnosis Date  ? Complicated migraine   ? Diverticulosis   ? External hemorrhoids   ? External hemorrhoids   ? Headache   ? Hx of cardiovascular stress test   ? Myoview 8/10:  no ischemia, EF 62%; low risk  ? Hx of Doppler ultrasound   ? Carotid US 6/15:  bilat ICA 1-39%  ? Hx of echocardiogram   ? echo 6/15:  EF 60-65%, no RWMA  ? Internal hemorrhoids   ? Palpitations   ? Stroke [facility])   ? TIA (transient ischemic attack)   ? Tubular adenoma of colon   ? Vision abnormalities   ? ? ?Past Surgical History:  ?Procedure Laterality Date  ? APPENDECTOMY  12/01/11  ? lap appy  ? CORONARY STENT INTERVENTION N/A 07/17/2021  ? Procedure: CORONARY STENT INTERVENTION;  Surgeon: Burnell Blanks, MD;  Location: [place] CV LAB;  Service: Cardiovascular;  Laterality: N/A;  ? LAPAROSCOPIC APPENDECTOMY  12/02/2011  ? Procedure: APPENDECTOMY LAPAROSCOPIC;  Surgeon: Madilyn Hook, DO;  Location: [facility] OR[facility];  Service:  General;  Laterality: N/A;  ? LEFT HEART CATH AND CORONARY ANGIOGRAPHY N/A 07/17/2021  ? Procedure: LEFT HEART CATH AND CORONARY ANGIOGRAPHY;  Surgeon: Burnell Blanks, MD;  Location: [place] CV LAB;  Service: Cardiovascular;  Laterality: N/A;  ?  ? ?Medications Prior to Admission: ?Prior to Admission medications   ?Medication Sig Start Date End Date Taking? Authorizing Provider  ?aspirin EC 81 MG tablet Take 81 mg by mouth once. Swallow whole.    [provider]  ?clopidogrel (PLAVIX)  75 MG tablet Take 1 tablet (75 mg total) by mouth daily with breakfast. 07/18/21   Sande Rives E, PA-C  ?metoprolol succinate (TOPROL XL) 25 MG 24 hr tablet Take 1 tablet (25 mg total) by mouth daily. 07/10/21   Loel Dubonnet, NP  ?nitroGLYCERIN (NITROSTAT) 0.4 MG SL tablet Place 1 tablet (0.4 mg total) under the tongue every 5 (five) minutes as needed for chest pain. 07/12/21 10/10/21  Loel Dubonnet, NP  ?rosuvastatin (CRESTOR) 20 MG tablet Take 1 tablet (20 mg total) by mouth daily. 07/18/21   Sande Rives E, PA-C  ?terbinafine (LAMISIL) 1 % cream Apply 1 application topically 2 (two) times daily. 12/11/20   Wendie Agreste, MD  ?  ? ?Allergies:    ?Allergies  ?Allergen Reactions  ? Wellbutrin [Bupropion Hcl] Itching and Rash  ? ? ?Social History:   ?Social History  ? ?Socioeconomic History  ? Marital status: Divorced  ?  Spouse name: Not on file  ? Number of children: Not on file  ? Years of education: Not on file  ? Highest education level: Not on file  ?Occupational History  ? Occupation: part time  ?Tobacco Use  ? Smoking status: Never  ? Smokeless tobacco: Never  ?Vaping Use  ? Vaping Use: Never used  ?Substance and Sexual Activity  ? Alcohol use: No  ? Drug use: No  ? Sexual activity: Not on file  ?Other Topics Concern  ? Not on file  ?Social History Narrative  ? Lives alone  ? Right Handed  ? Drinks very little caffeine  ? ?Social Determinants of Health  ? ?Financial Resource Strain: Not on file  ?Food Insecurity: Not on file  ?Transportation Needs: Not on file  ?Physical Activity: Not on file  ?Stress: Not on file  ?Social Connections: Not on file  ?Intimate Partner Violence: Not on file  ?  ?Family History:   ?The patient's family history includes Hypertension in his father and mother. There is no history of Anemia, Arrhythmia, Asthma, Clotting disorder, Fainting, Heart attack, Heart disease, Heart failure, Hyperlipidemia, Colon cancer, Esophageal cancer, Stomach cancer, or Rectal cancer.    ? ?ROS:  ?Please see the history of present illness.  ?All other ROS reviewed and negative.    ? ?Physical Exam/Data:  ? ?Vitals:  ? 07/20/21 0900 07/20/21 0915 07/20/21 1008 07/20/21 1341  ?BP: 137/80 132/83 138/76 126/85  ?Pulse: 77 72 70 63  ?Resp: '20 17 17 19  '$ ?Temp:   98.3 ?F (36.8 ?C) 98.1 ?F (36.7 ?C)  ?TempSrc:   Oral Oral  ?SpO2: 94% 99% 100% 96%  ?Weight:      ?Height:      ? ?No intake or output data in the 24 hours ending 07/20/21 1620 ? ?  07/20/2021  ?  3:48 AM 07/17/2021  ? 10:57 AM 07/16/2021  ?  8:53 PM  ?Last 3 Weights  ?Weight (lbs) 171 lb 1.2 oz 171 lb 171 lb  ?Weight (kg) 77.6  kg 77.565 kg 77.565 kg  ?   ?Body mass index is 26.79 kg/m?.  ?General:  Well nourished, well developed, in no acute distress ?HEENT: Sclera anicteric ?Neck: no JVD ?Vascular: No carotid bruits; Distal pulses 2+ bilaterally   ?Cardiac:  normal S1, S2; RRR; no murmurs, rubs, or gallops ?Lungs:  clear to auscultation bilaterally, no wheezing, rhonchi or rales  ?Abd: soft, nontender, no hepatomegaly  ?Ext: no edema ?Musculoskeletal:  No deformities, BUE and BLE strength normal and equal ?Skin: warm and dry  ?Neuro:  CNs 2-12 intact, no focal abnormalities noted ?Psych:  Normal affect  ? ? ?EKG:  sinus rhythm, rate 77 BPM, no STE/D, overall unchanged from previous.   ? ?Relevant CV Studies: ?Left Cardiac Catheterization 07/17/2021: ?  Prox RCA lesion is 30% stenosed. ?  1st Mrg lesion is 30% stenosed. ?  Mid LAD lesion is 80% stenosed. ?  A drug-eluting stent was successfully placed using a [place] 2.75X22. ?  Post intervention, there is a 0% residual stenosis. ?  ?Severe mid LAD stenosis ?Mild non-obstructive disease in the mid RCA and first obtuse marginal branch ?Successful PTCA/DES x 1 mid LAD ?  ?Recommendations: Continue DAPT with ASA/Plavix for at least six months. Will continue beta blocker and increase Crestor to 20 mg daily. Pt will be watched overnight tonight on telemetry.  ?  ?Diagnostic ?Dominance:  Right ?Intervention ?  ?  ? ?Coronary CTA 07/09/2021: ?1. Left Main: FFR = 0.98 ?  ?2. LAD: Proximal FFR = 0.97, mid FFR = 0.75, distal FFR = 0.71 ?3. LCX: Proximal FFR = 0.94, distal FFR = 0.83 ?4. RCA: Was not a

## 2021-07-20 NOTE — Progress Notes (Signed)
Pt admitted to [place], VS wnL and as per flow. Pt oriented to 6E processes. Pt thirsty but educated on NPO status. Declined ice chips. All questions and concerns addressed. Call bell placed within reach, will continue to monitor and maintain safety.  ?

## 2021-07-20 NOTE — ED Triage Notes (Signed)
Pt c/o left sided chest pain with shob x 1 hour. Pt s/p cardiac stent ?

## 2021-07-21 ENCOUNTER — Observation Stay (HOSPITAL_BASED_OUTPATIENT_CLINIC_OR_DEPARTMENT_OTHER): Payer: 59

## 2021-07-21 DIAGNOSIS — E785 Hyperlipidemia, unspecified: Secondary | ICD-10-CM | POA: Diagnosis not present

## 2021-07-21 DIAGNOSIS — R079 Chest pain, unspecified: Secondary | ICD-10-CM | POA: Diagnosis not present

## 2021-07-21 DIAGNOSIS — Z8673 Personal history of transient ischemic attack (TIA), and cerebral infarction without residual deficits: Secondary | ICD-10-CM | POA: Diagnosis not present

## 2021-07-21 DIAGNOSIS — Z7982 Long term (current) use of aspirin: Secondary | ICD-10-CM | POA: Diagnosis not present

## 2021-07-21 DIAGNOSIS — I214 Non-ST elevation (NSTEMI) myocardial infarction: Secondary | ICD-10-CM | POA: Diagnosis not present

## 2021-07-21 LAB — HEMOGLOBIN A1C
Hgb A1c MFr Bld: 5.4 % (ref 4.8–5.6)
Mean Plasma Glucose: 108.28 mg/dL

## 2021-07-21 LAB — BASIC METABOLIC PANEL
Anion gap: 9 (ref 5–15)
BUN: 12 mg/dL (ref 6–20)
CO2: 21 mmol/L — ABNORMAL LOW (ref 22–32)
Calcium: 8.9 mg/dL (ref 8.9–10.3)
Chloride: 108 mmol/L (ref 98–111)
Creatinine, Ser: 0.99 mg/dL (ref 0.61–1.24)
GFR, Estimated: >60 mL/min (ref >60)
Glucose, Bld: 92 mg/dL (ref 70–99)
Potassium: 4 mmol/L (ref 3.5–5.1)
Sodium: 138 mmol/L (ref 135–145)

## 2021-07-21 LAB — ECHOCARDIOGRAM COMPLETE
AR max vel: 2.67 cm2
AV Area VTI: 3.12 cm2
AV Area mean vel: 2.81 cm2
AV Mean grad: 3.3 mmHg
AV Peak grad: 6.7 mmHg
Ao pk vel: 1.3 m/s
Area-P 1/2: 3.63 cm2
Height: 67 in
S' Lateral: 3.1 cm
Weight: 2737.23 oz

## 2021-07-21 LAB — BRAIN NATRIURETIC PEPTIDE: B Natriuretic Peptide: 14 pg/mL (ref 0.0–100.0)

## 2021-07-21 LAB — TSH: TSH: 4.224 u[IU]/mL (ref 0.350–4.500)

## 2021-07-21 LAB — HIV ANTIBODY (ROUTINE TESTING W REFLEX): HIV Screen 4th Generation wRfx: NONREACTIVE

## 2021-07-21 NOTE — Progress Notes (Signed)
Pt safely discharged. Discharge packet provided with teach-back method. VS wnL and as per flow. IVs removed, Pt verbalized understanding. All questions and concerns addressed. Brother arranging transport.  ?

## 2021-07-21 NOTE — Progress Notes (Signed)
?  Echocardiogram ?2D Echocardiogram has been performed. ? ?Todd [name] ?07/21/2021, 2:31 PM ?

## 2021-07-21 NOTE — Discharge Summary (Addendum)
?Discharge Summary  ?  ?Patient ID: Todd [name] ?MRN: [id number]; DOB: Sep 23, 1960 ? ?Admit date: 07/20/2021 ?Discharge date: 07/21/2021 ? ?PCP:  Wendie Agreste, MD ?  ?[facility] Providers ?Cardiologist:  Pixie Casino, MD  ? ?Discharge Diagnoses  ?  ?Principal Problem: ?  NSTEMI (non-ST elevated myocardial infarction) ([place]) ?Active Problems: ?  Shortness of breath ?  Chest pain ?  CAD (coronary artery disease) ?  Hyperlipidemia ? ? ?Diagnostic Studies/Procedures  ?  ?Echo 07/21/21: ?1. Left ventricular ejection fraction, by estimation, is 65 to 70%. The  ?left ventricle has normal function. The left ventricle has no regional  ?wall motion abnormalities. Left ventricular diastolic parameters are  ?indeterminate.  ? 2. Right ventricular systolic function is normal. The right ventricular  ?size is normal. Tricuspid regurgitation signal is inadequate for assessing  ?PA pressure.  ? 3. The mitral valve is normal in structure. No evidence of mitral valve  ?regurgitation. No evidence of mitral stenosis.  ? 4. The aortic valve has an indeterminant number of cusps. Aortic valve  ?regurgitation is mild.  ?_____________ ?  ?Left Cardiac Catheterization 07/17/2021: ?  Prox RCA lesion is 30% stenosed. ?  1st Mrg lesion is 30% stenosed. ?  Mid LAD lesion is 80% stenosed. ?  A drug-eluting stent was successfully placed using a [place] 2.75X22. ?  Post intervention, there is a 0% residual stenosis. ?  ?Severe mid LAD stenosis ?Mild non-obstructive disease in the mid RCA and first obtuse marginal branch ?Successful PTCA/DES x 1 mid LAD ?  ?Recommendations: Continue DAPT with ASA/Plavix for at least six months. Will continue beta blocker and increase Crestor to 20 mg daily. Pt will be watched overnight tonight on telemetry. ? ?History of Present Illness   ?  ?Todd [name] is a 61 y.o. male with PMH of CAD s/p recent PCI/DES to LAD 07/17/2021, HLD, CVA, GERD, and migraines who is being seen 07/20/2021 for the evaluation  of chest pain. ? ?[name] recently underwent cardiac catheterization with PCI/DES to LAD for management of 80% stenosis with additional 30% first marginal and 30% proximal RCA stenosis which were medically managed.  He was recommended to continue aspirin/Plavix for at least 6 months and increase his Crestor to 20 mg daily.  He was monitored overnight and discharged home 07/18/2021 the following day.  His cardiac work-up was prompted by atypical chest pain for which she underwent a coronary CTA revealing moderate CAD in the LAD which ultimately led to his heart catheterization as above. ?  ?He returned to [facility] ED early 07/20/2021 with complaints of left-sided chest pain for approximately 2 hours with associated shortness of breath.  He reported episode of chest pain felt similar to prior episodes before.  He reports compliance with his aspirin and Plavix. EKG shows sinus rhythm, rate 77 BPM, no STE/D, overall unchanged from previous.  CXR without acute findings.  Labs notable for K3.2, creatinine 1.03, CBC WNL, HsTrop 504> 404.  Vitals with mildly elevated blood pressure, otherwise WNL.  He was given 40 mEq potassium in the ED as well as 324 mg aspirin and home Plavix 75 mg.  Decision made to transfer patient to [facility] for cardiology evaluation. ?  ?At the time of this evaluation he is chest pain-free.  He reported feeling fine yesterday, was able to walk short distances without chest pain or significant shortness of breath.  Around 2 AM this morning he reported having sharp chest pain which moved around his chest.  He noticed acute worsening of his shortness of breath prompting EMS activation.  At this time shortness of breath appears to be his primary complaint.  This has been going on for about year-he notices shortness of breath with walking short distances, eating, and talking.  He denies orthopnea, PND, lower extremity edema, or weight gain.  He denies palpitations, syncope, abdominal pain, nausea,  vomiting, early satiety. ? ?Hospital Course  ?   ?Consultants:  ? ?Chest pain, shortness of breath ?HS troponin 504 --> 404 ?EKG was nonischemic ?CE were not trended during recent hospitalization in which heart cath was scheduled as an outpatient following abnormal CT coronary. He did have successful PCI with DES to LAD. He is on DAPT and has not missed any doses. He was admitted and observed overnight. Echocardiogram this morning revealed EF 65 to 70%, no RWMA, indeterminate LV diastolic function, normal RV size/function, and mild AI. ? ? ?Dyspnea on exertion ?Has been going on for the past several months, not improved with recent PCI. Echo reassuring, as above ?Will refer to pulmonology.  ? ? ?CAD s/p DES-LAD ?Continue DAPT, BB ?No evidence of stent failure. ? ? ?Hypertension  ?No change in therapy. ? ? ?Hyperlipidemia with LDL goal < 70 ?10/22/2020: Cholesterol 238; HDL 46.40; LDL Cholesterol 167; Triglycerides 123.0; VLDL 24.6 ?Crestor was increased from 5 mg to 20 mg at the time of last discharge.  ? ? ? ?Did the patient have an acute coronary syndrome (MI, NSTEMI, STEMI, etc) this admission?:  No                               ?Did the patient have a percutaneous coronary intervention (stent / angioplasty)?:  No.   ? ?   ? ?The patient will be scheduled for a TOC follow up appointment in 7-14 days.  A message has been sent to the [facility] and Scheduling Pool at the office where the patient should be seen for follow up.  ?_____________ ? ?Discharge Vitals ?Blood pressure 109/70, pulse 63, temperature 97.9 ?F (36.6 ?C), temperature source Oral, resp. rate 15, height '5\' 7"'$  (1.702 m), weight 77.6 kg, SpO2 96 %.  ?Filed Weights  ? 07/20/21 0348 07/21/21 0500  ?Weight: 77.6 kg 77.6 kg  ? ? ?Labs & Radiologic Studies  ?  ?CBC ?Recent Labs  ?  07/20/21 ?0356 07/20/21 ?9211  ?WBC 8.9 9.4  ?NEUTROABS 5.4  --   ?HGB 15.3 15.2  ?HCT 42.2 42.8  ?MCV 83.7 85.9  ?PLT 272 244  ? ?Basic Metabolic Panel ?Recent Labs  ?   07/20/21 ?0356 07/20/21 ?1631 07/21/21 ?0246  ?NA 137  --  138  ?K 3.2*  --  4.0  ?CL 107  --  108  ?CO2 22  --  21*  ?GLUCOSE 108*  --  92  ?BUN 17  --  12  ?CREATININE 1.03 0.98 0.99  ?CALCIUM 9.0  --  8.9  ? ?Liver Function Tests ?Recent Labs  ?  07/20/21 ?0356  ?AST 26  ?ALT 25  ?ALKPHOS 55  ?BILITOT 1.4*  ?PROT 7.5  ?ALBUMIN 4.0  ? ?No results for input(s): LIPASE, AMYLASE in the last 72 hours. ?High Sensitivity Troponin:   ?Recent Labs  ?Lab 07/12/21 ?0305 07/16/21 ?2054 07/16/21 ?2317 07/20/21 ?9417 07/20/21 ?0606  ?TROPONINIHS 2 <2 <2 504* 404*  ?  ?BNP ?Invalid input(s): POCBNP ?D-Dimer ?No results for input(s): DDIMER in the last 72 hours. ?  Hemoglobin A1C ?Recent Labs  ?  07/21/21 ?0246  ?HGBA1C 5.4  ? ?Fasting Lipid Panel ?No results for input(s): CHOL, HDL, LDLCALC, TRIG, CHOLHDL, LDLDIRECT in the last 72 hours. ?Thyroid Function Tests ?Recent Labs  ?  07/21/21 ?0246  ?TSH 4.224  ? ?_____________  ?DG Chest 2 View ? ?Result Date: 07/16/2021 ?CLINICAL DATA:  Chest pain EXAM: CHEST - 2 VIEW COMPARISON:  07/12/2021 FINDINGS: The heart size and mediastinal contours are within normal limits. Both lungs are clear. Mild scoliosis IMPRESSION: No active cardiopulmonary disease. Electronically Signed   By: Donavan Foil M.D.   On: 07/16/2021 21:12  ? ?DG Chest 2 View ? ?Result Date: 07/12/2021 ?CLINICAL DATA:  Chest pain EXAM: CHEST - 2 VIEW COMPARISON:  06/12/2021 FINDINGS: The heart size and mediastinal contours are within normal limits. Both lungs are clear. The visualized skeletal structures are unremarkable. IMPRESSION: No active cardiopulmonary disease. Electronically Signed   By: Ulyses Jarred M.D.   On: 07/12/2021 01:18  ? ?CARDIAC CATHETERIZATION ? ?Result Date: 07/17/2021 ?  Prox RCA lesion is 30% stenosed.   1st Mrg lesion is 30% stenosed.   Mid LAD lesion is 80% stenosed.   A drug-eluting stent was successfully placed using a STENT [place] 2.75X22.   Post intervention, there is a 0% residual  stenosis. Severe mid LAD stenosis Mild non-obstructive disease in the mid RCA and first obtuse marginal branch Successful PTCA/DES x 1 mid LAD Recommendations: Continue DAPT with ASA/Plavix for at least six months. Will cont

## 2021-07-21 NOTE — Progress Notes (Signed)
? ?Progress Note ? ?Patient Name: Todd [name] ?Date of Encounter: 07/21/2021 ? ?[facility] Cardiologist: Pixie Casino, MD  ? ?Subjective  ? ?Was worried mostly about his dyspnea on exertion that has been going on over the past several months.  If echocardiogram reassuring, we will have him set up with pulmonary. ? ?Inpatient Medications  ?  ?Scheduled Meds: ? aspirin EC  81 mg Oral Daily  ? clopidogrel  75 mg Oral Daily  ? enoxaparin (LOVENOX) injection  40 mg Subcutaneous Q24H  ? metoprolol succinate  25 mg Oral Daily  ? rosuvastatin  20 mg Oral QHS  ? ?Continuous Infusions: ? ?PRN Meds: ?acetaminophen, nitroGLYCERIN, ondansetron (ZOFRAN) IV  ? ?Vital Signs  ?  ?Vitals:  ? 07/20/21 1714 07/20/21 1942 07/21/21 0500 07/21/21 0600  ?BP: 126/78 132/83  109/70  ?Pulse: 67 74  63  ?Resp: '15 16  15  '$ ?Temp: 98.2 ?F (36.8 ?C) 98 ?F (36.7 ?C)  97.9 ?F (36.6 ?C)  ?TempSrc: Oral Oral  Oral  ?SpO2: 97% 99%  96%  ?Weight:   77.6 kg   ?Height:      ? ?No intake or output data in the 24 hours ending 07/21/21 0855 ? ?  07/21/2021  ?  5:00 AM 07/20/2021  ?  3:48 AM 07/17/2021  ? 10:57 AM  ?Last 3 Weights  ?Weight (lbs) 171 lb 1.2 oz 171 lb 1.2 oz 171 lb  ?Weight (kg) 77.6 kg 77.6 kg 77.565 kg  ?   ? ?Telemetry  ?  ?No adverse arrhythmias- Personally Reviewed ? ?ECG  ?  ?Sinus rhythm- Personally Reviewed ? ?Physical Exam  ? ?GEN: No acute distress.   ?Neck: No JVD ?Cardiac: RRR, no murmurs, rubs, or gallops.  ?Respiratory: Clear to auscultation bilaterally. ?GI: Soft, nontender, non-distended  ?MS: No edema; No deformity. ?Neuro:  Nonfocal  ?Psych: Normal affect  ? ?Labs  ?  ?High Sensitivity Troponin:   ?Recent Labs  ?Lab 07/12/21 ?0305 07/16/21 ?2054 07/16/21 ?2317 07/20/21 ?7564 07/20/21 ?0606  ?TROPONINIHS 2 <2 <2 504* 404*  ?   ?Chemistry ?Recent Labs  ?Lab 07/18/21 ?0310 07/20/21 ?0356 07/20/21 ?1631 07/21/21 ?0246  ?NA 138 137  --  138  ?K 3.5 3.2*  --  4.0  ?CL 110 107  --  108  ?CO2 20* 22  --  21*  ?GLUCOSE 81 108*  --   92  ?BUN 12 17  --  12  ?CREATININE 1.05 1.03 0.98 0.99  ?CALCIUM 8.3* 9.0  --  8.9  ?PROT  --  7.5  --   --   ?ALBUMIN  --  4.0  --   --   ?AST  --  26  --   --   ?ALT  --  25  --   --   ?ALKPHOS  --  55  --   --   ?BILITOT  --  1.4*  --   --   ?GFRNONAA >60 >60 >60 >60  ?ANIONGAP 8 8  --  9  ?  ?Lipids No results for input(s): CHOL, TRIG, HDL, LABVLDL, LDLCALC, CHOLHDL in the last 168 hours.  ?Hematology ?Recent Labs  ?Lab 07/18/21 ?0310 07/20/21 ?0356 07/20/21 ?1631  ?WBC 9.5 8.9 9.4  ?RBC 4.58 5.04 4.98  ?HGB 13.9 15.3 15.2  ?HCT 39.6 42.2 42.8  ?MCV 86.5 83.7 85.9  ?MCH 30.3 30.4 30.5  ?MCHC 35.1 36.3* 35.5  ?RDW 12.0 12.0 12.1  ?PLT 223 272 244  ? ?Thyroid  ?Recent  Labs  ?Lab 07/21/21 ?0246  ?TSH 4.224  ?  ?BNP ?Recent Labs  ?Lab 07/21/21 ?0246  ?BNP 14.0  ?  ?DDimer No results for input(s): DDIMER in the last 168 hours.  ? ?Radiology  ?  ?DG Chest Portable 1 View ? ?Result Date: 07/20/2021 ?CLINICAL DATA:  Left-sided chest pain and shortness of breath. EXAM: PORTABLE CHEST 1 VIEW COMPARISON:  07/16/2021 FINDINGS: The lungs are clear without focal pneumonia, edema, pneumothorax or pleural effusion. The cardiopericardial silhouette is within normal limits for size. The visualized bony structures of the thorax are unremarkable. Telemetry leads overlie the chest. IMPRESSION: No active disease. Electronically Signed   By: Misty Stanley M.D.   On: 07/20/2021 04:34   ? ?Cardiac Studies  ? ?Awaiting echocardiogram ? ?Left Cardiac Catheterization 07/17/2021: ?  Prox RCA lesion is 30% stenosed. ?  1st Mrg lesion is 30% stenosed. ?  Mid LAD lesion is 80% stenosed. ?  A drug-eluting stent was successfully placed using a [place] 2.75X22. ?  Post intervention, there is a 0% residual stenosis. ?  ?Severe mid LAD stenosis ?Mild non-obstructive disease in the mid RCA and first obtuse marginal branch ?Successful PTCA/DES x 1 mid LAD ?  ?Recommendations: Continue DAPT with ASA/Plavix for at least six months. Will  continue beta blocker and increase Crestor to 20 mg daily. Pt will be watched overnight tonight on telemetry.  ?  ?Diagnostic ?Dominance: Right ?Intervention ?  ?  ?  ?Coronary CTA 07/09/2021: ?1. Left Main: FFR = 0.98 ?  ?2. LAD: Proximal FFR = 0.97, mid FFR = 0.75, distal FFR = 0.71 ?3. LCX: Proximal FFR = 0.94, distal FFR = 0.83 ?4. RCA: Was not analyzed ?  ?IMPRESSION: ?1. CT FFR analysis showed flow limiting stenosis in the mid to ?distal LAD. ?  ?RECOMMENDATIONS: ?Recommend Cardiac Catheterization. ? ?Patient Profile  ?   ?61 y.o. male with CAD recent PCI dyspnea on exertion hypertension hyperlipidemia hypokalemia ? ?Assessment & Plan  ?  ?Dyspnea on exertion ?- Awaiting echocardiogram. ? ?Coronary artery disease ?- LAD stent placed just several days ago.  No evidence of stent failure. ? ?Elevated troponin ?- 500 originally down to 400 post stent placement.  Myocardial injury in the setting of stent placement. ? ?Hypertension ?- Stable continue with metoprolol ? ?Hyperlipidemia ?- LDL 167 in 2022.  Crestor was increased to 20 mg daily.  Follow with lipid panel in 8 weeks. ? ?Hypokalemia ?- Supplements given. ? ?For questions or updates, please contact [place] ?Please consult [email or web address] for contact info under  ? ?  ?   ?Signed, ?Candee Furbish, MD  ?07/21/2021, 8:55 AM   ? ?

## 2021-07-22 ENCOUNTER — Telehealth (HOSPITAL_BASED_OUTPATIENT_CLINIC_OR_DEPARTMENT_OTHER): Payer: Self-pay | Admitting: Family

## 2021-07-22 NOTE — Telephone Encounter (Signed)
Left message for patient to call and discuss scheduling a post hospital visit per 07/21/21 staff message fron Fabian Sharp, PA ?

## 2021-07-23 ENCOUNTER — Emergency Department (HOSPITAL_COMMUNITY)
Admission: EM | Admit: 2021-07-23 | Discharge: 2021-07-24 | Disposition: A | Discharge status: Home / Self Care | Payer: 59 | Source: Home / Self Care | Attending: Emergency Medicine | Admitting: Emergency Medicine

## 2021-07-23 ENCOUNTER — Encounter (HOSPITAL_COMMUNITY): Payer: Self-pay

## 2021-07-23 ENCOUNTER — Emergency Department (HOSPITAL_COMMUNITY): Payer: 59

## 2021-07-23 ENCOUNTER — Emergency Department (HOSPITAL_COMMUNITY)
Admission: EM | Admit: 2021-07-23 | Discharge: 2021-07-23 | Disposition: A | Discharge status: Home / Self Care | Payer: 59 | Attending: Emergency Medicine | Admitting: Emergency Medicine

## 2021-07-23 ENCOUNTER — Other Ambulatory Visit: Payer: Self-pay

## 2021-07-23 DIAGNOSIS — Z7902 Long term (current) use of antithrombotics/antiplatelets: Secondary | ICD-10-CM | POA: Insufficient documentation

## 2021-07-23 DIAGNOSIS — Z7982 Long term (current) use of aspirin: Secondary | ICD-10-CM | POA: Insufficient documentation

## 2021-07-23 DIAGNOSIS — R079 Chest pain, unspecified: Secondary | ICD-10-CM

## 2021-07-23 DIAGNOSIS — R0789 Other chest pain: Secondary | ICD-10-CM | POA: Insufficient documentation

## 2021-07-23 DIAGNOSIS — Z79899 Other long term (current) drug therapy: Secondary | ICD-10-CM | POA: Insufficient documentation

## 2021-07-23 DIAGNOSIS — K644 Residual hemorrhoidal skin tags: Secondary | ICD-10-CM | POA: Diagnosis present

## 2021-07-23 DIAGNOSIS — I2510 Atherosclerotic heart disease of native coronary artery without angina pectoris: Secondary | ICD-10-CM | POA: Insufficient documentation

## 2021-07-23 DIAGNOSIS — K649 Unspecified hemorrhoids: Secondary | ICD-10-CM

## 2021-07-23 LAB — CBC WITH DIFFERENTIAL/PLATELET
Abs Immature Granulocytes: 0.04 10*3/uL (ref 0.00–0.07)
Basophils Absolute: 0.1 10*3/uL (ref 0.0–0.1)
Basophils Relative: 1 %
Eosinophils Absolute: 0.2 10*3/uL (ref 0.0–0.5)
Eosinophils Relative: 2 %
HCT: 43.8 % (ref 39.0–52.0)
Hemoglobin: 15.3 g/dL (ref 13.0–17.0)
Immature Granulocytes: 1 %
Lymphocytes Relative: 19 %
Lymphs Abs: 1.6 10*3/uL (ref 0.7–4.0)
MCH: 30.4 pg (ref 26.0–34.0)
MCHC: 34.9 g/dL (ref 30.0–36.0)
MCV: 87.1 fL (ref 80.0–100.0)
Monocytes Absolute: 0.6 10*3/uL (ref 0.1–1.0)
Monocytes Relative: 7 %
Neutro Abs: 6.1 10*3/uL (ref 1.7–7.7)
Neutrophils Relative %: 70 %
Platelets: 264 10*3/uL (ref 150–400)
RBC: 5.03 MIL/uL (ref 4.22–5.81)
RDW: 12.1 % (ref 11.5–15.5)
WBC: 8.6 10*3/uL (ref 4.0–10.5)
nRBC: 0 % (ref 0.0–0.2)

## 2021-07-23 LAB — CBC
HCT: 43.1 % (ref 39.0–52.0)
HCT: 44.8 % (ref 39.0–52.0)
Hemoglobin: 15 g/dL (ref 13.0–17.0)
Hemoglobin: 15.5 g/dL (ref 13.0–17.0)
MCH: 30.5 pg (ref 26.0–34.0)
MCH: 30.6 pg (ref 26.0–34.0)
MCHC: 34.8 g/dL (ref 30.0–36.0)
MCHC: 34.6 g/dL (ref 30.0–36.0)
MCV: 87.6 fL (ref 80.0–100.0)
MCV: 88.4 fL (ref 80.0–100.0)
Platelets: 276 10*3/uL (ref 150–400)
Platelets: 301 10*3/uL (ref 150–400)
RBC: 4.92 MIL/uL (ref 4.22–5.81)
RBC: 5.07 MIL/uL (ref 4.22–5.81)
RDW: 12.2 % (ref 11.5–15.5)
RDW: 12.1 % (ref 11.5–15.5)
WBC: 11.5 10*3/uL — ABNORMAL HIGH (ref 4.0–10.5)
WBC: 10 10*3/uL (ref 4.0–10.5)
nRBC: 0 % (ref 0.0–0.2)
nRBC: 0 % (ref 0.0–0.2)

## 2021-07-23 LAB — BASIC METABOLIC PANEL
Anion gap: 7 (ref 5–15)
Anion gap: 7 (ref 5–15)
BUN: 21 mg/dL — ABNORMAL HIGH (ref 6–20)
BUN: 13 mg/dL (ref 6–20)
CO2: 24 mmol/L (ref 22–32)
CO2: 22 mmol/L (ref 22–32)
Calcium: 9 mg/dL (ref 8.9–10.3)
Calcium: 9.2 mg/dL (ref 8.9–10.3)
Chloride: 108 mmol/L (ref 98–111)
Chloride: 108 mmol/L (ref 98–111)
Creatinine, Ser: 1.18 mg/dL (ref 0.61–1.24)
Creatinine, Ser: 0.97 mg/dL (ref 0.61–1.24)
GFR, Estimated: >60 mL/min (ref >60)
GFR, Estimated: >60 mL/min (ref >60)
Glucose, Bld: 133 mg/dL — ABNORMAL HIGH (ref 70–99)
Glucose, Bld: 108 mg/dL — ABNORMAL HIGH (ref 70–99)
Potassium: 3.5 mmol/L (ref 3.5–5.1)
Potassium: 4.4 mmol/L (ref 3.5–5.1)
Sodium: 139 mmol/L (ref 135–145)
Sodium: 137 mmol/L (ref 135–145)

## 2021-07-23 LAB — TROPONIN I (HIGH SENSITIVITY): Troponin I (High Sensitivity): 19 ng/L — ABNORMAL HIGH (ref <18)

## 2021-07-23 MED ORDER — SENNA 8.6 MG PO TABS: 1.0000 | ORAL_TABLET | Freq: Two times a day (BID) | ORAL | 0 refills | Status: DC

## 2021-07-23 MED ORDER — POLYETHYLENE GLYCOL 3350 17 G PO PACK: 17.0000 g | PACK | Freq: Two times a day (BID) | ORAL | 0 refills | Status: DC

## 2021-07-23 NOTE — ED Provider Triage Note (Signed)
Emergency Medicine Provider Triage Evaluation Note ? ?Todd [name] , a 61 y.o. male  was evaluated in triage.  Pt complains of increasing intermittent chest pain and discomfort with exertion.  Had stent placement 1 week ago.  Had symptoms after discharge initially, was readmitted for 2 days for observation, noticed troponins trending downwards.  No notes increasing symptoms again.  Denies shortness of breath, fever, upper respiratory type symptoms. ? ?Review of Systems  ?Positive: As above ?Negative: As above ? ?Physical Exam  ?BP 132/83 (BP Location: Left Arm)   Pulse 74   Temp 97.6 ?F (36.4 ?C) (Oral)   Resp (!) 22   SpO2 99%  ?Gen:   Awake, no distress   ?Resp:  Normal effort CTAB ?MSK:   Moves extremities without difficulty  ?Other:  RRR without M/R/G.  No lower extremity edema.  Radial pulses 2+ bilaterally. ? ?Medical Decision Making  ?Medically screening exam initiated at 9:26 PM.  Appropriate orders placed.  Dillinger Aston was informed that the remainder of the evaluation will be completed by another provider, this initial triage assessment does not replace that evaluation, and the importance of remaining in the ED until their evaluation is complete. ? ?Labs, imaging, EKG ordered ?  ?Prince Rome, PA-C ?34/74/25 2131 ? ?

## 2021-07-23 NOTE — ED Triage Notes (Signed)
Pt presents with left sided chest pain.  Hx of recent stent placement and admission thereafter for CP and elevated troponins.  Pt states he is better now.  Encouraged to stay for eval and speak with EDPA ?

## 2021-07-23 NOTE — Discharge Instructions (Addendum)
Your bleeding was controlled in the emergency room using a quick clot medication.  Your blood count (hemoglobin) were stable in the emergency room.  You do have multiple hemorrhoids on exam.  I spoke with Dr. Marcello Moores who is a general surgeon regarding your case.  They did not recommend any intervention at this time however they do ask that you start bowel regimen including fiber supplement twice a day, and stool softeners twice a day as well.  You can also do sitz bath.  They also asked that you call their office to schedule an appointment.  I have attached their information above for you.  Please give them a call soon as their office opens up this morning.  If you have any recurrent episodes of bleeding or unable to control at home please return to the emergency room immediately for evaluation.  I have sent MiraLAX, and Senokot into the pharmacy for you.  I want you to buy fiber supplement over-the-counter. ?

## 2021-07-23 NOTE — Telephone Encounter (Signed)
Left message for patient to call and schedule post ED visit per 07/21/21 staff message from Fabian Sharp, [place] ?

## 2021-07-23 NOTE — ED Triage Notes (Addendum)
Pt to ED from home via [facility].  Pt has had diarrhea today and when he went to bathroom last, his hemorrhoids started bleeding.  Pt has active bleeding from hemorrhoids. Pt denies pain. Pt told EMS he was feeling slightly dizzy.  Pt A&Ox4, NAD noted on arrival. Pt taking plavix and ASA d/t stent 07/20/21. ? ?EMS VSS ?

## 2021-07-23 NOTE — ED Notes (Signed)
EKG done not crossing over 

## 2021-07-23 NOTE — ED Notes (Signed)
PA at bedside.

## 2021-07-23 NOTE — ED Provider Notes (Addendum)
?[facility] DEPT ?Provider Note ? ? ?CSN: [id number] ?Arrival date & time: 07/23/21  0023 ? ?  ? ?History ? ?Chief Complaint  ?Patient presents with  ? bleeding hemmorhoids  ? ? ?Todd [name] is a 61 y.o. male. ? ?61 year old male with past medical history significant for CAD status post recent stenting presents today for evaluation of hemorrhoid bleeding.  Patient states bleeding started around midnight.  Patient has history of hemorrhoids.  He states he is constipated and did have to strain during bowel movement earlier tonight.  He states he has tried pressure without success at home.  He states he has had this in the past however bleeding was easily controlled.  He was recently started on dual antiplatelet therapy which he reports compliance with.  He has never received surgical correction for his hemorrhoids in the past. ? ?The history is provided by the patient. No language interpreter was used.  ? ?  ? ?Home Medications ?Prior to Admission medications   ?Medication Sig Start Date End Date Taking? Authorizing Provider  ?aspirin EC 81 MG tablet Take 81 mg by mouth once. Swallow whole.    [provider]  ?clopidogrel (PLAVIX) 75 MG tablet Take 1 tablet (75 mg total) by mouth daily with breakfast. 07/18/21   Sande Rives E, PA-C  ?metoprolol succinate (TOPROL XL) 25 MG 24 hr tablet Take 1 tablet (25 mg total) by mouth daily. 07/10/21   Loel Dubonnet, NP  ?nitroGLYCERIN (NITROSTAT) 0.4 MG SL tablet Place 1 tablet (0.4 mg total) under the tongue every 5 (five) minutes as needed for chest pain. 07/12/21 10/10/21  Loel Dubonnet, NP  ?rosuvastatin (CRESTOR) 20 MG tablet Take 1 tablet (20 mg total) by mouth daily. 07/18/21   Sande Rives E, PA-C  ?terbinafine (LAMISIL) 1 % cream Apply 1 application topically 2 (two) times daily. 12/11/20   Wendie Agreste, MD  ?   ? ?Allergies    ?Wellbutrin [bupropion hcl]   ? ?Review of Systems   ?Review of Systems  ?Constitutional:   Negative for chills and fever.  ?Respiratory:  Negative for shortness of breath.   ?Cardiovascular:  Negative for chest pain.  ?Gastrointestinal:  Negative for abdominal pain.  ?Neurological:  Negative for syncope and light-headedness.  ?Hematological:  Bruises/bleeds easily.  ?All other systems reviewed and are negative. ? ?Physical Exam ?Updated Vital Signs ?BP 137/76   Pulse 94   Temp 98.2 ?F (36.8 ?C) (Oral)   Resp 16   Ht '5\' 7"'$  (1.702 m)   Wt 77.1 kg   SpO2 99%   BMI 26.63 kg/m?  ?Physical Exam ?Vitals and nursing note reviewed. Exam conducted with a chaperone present.  ?Constitutional:   ?   General: He is not in acute distress. ?   Appearance: Normal appearance. He is not ill-appearing.  ?HENT:  ?   Head: Normocephalic and atraumatic.  ?   Nose: Nose normal.  ?Eyes:  ?   General: No scleral icterus. ?   Extraocular Movements: Extraocular movements intact.  ?   Conjunctiva/sclera: Conjunctivae normal.  ?Cardiovascular:  ?   Rate and Rhythm: Normal rate and regular rhythm.  ?   Pulses: Normal pulses.  ?   Heart sounds: Normal heart sounds.  ?Pulmonary:  ?   Effort: Pulmonary effort is normal. No respiratory distress.  ?   Breath sounds: Normal breath sounds. No wheezing or rales.  ?Abdominal:  ?   General: There is no distension.  ?  Tenderness: There is no abdominal tenderness.  ?Genitourinary: ?   Comments: Rectal exam with multiple obvious external hemorrhoids with active bleeding noted.  Initial exam did note large external clot. ?Musculoskeletal:     ?   General: Normal range of motion.  ?   Cervical back: Normal range of motion.  ?Skin: ?   General: Skin is warm and dry.  ?Neurological:  ?   General: No focal deficit present.  ?   Mental Status: He is alert. Mental status is at baseline.  ? ? ?ED Results / Procedures / Treatments   ?Labs ?(all labs ordered are listed, but only abnormal results are displayed) ?Labs Reviewed  ?BASIC METABOLIC PANEL - Abnormal; Notable for the following components:   ?    Result Value  ? Glucose, Bld 133 (*)   ? BUN 21 (*)   ? All other components within normal limits  ?CBC WITH DIFFERENTIAL/PLATELET  ? ? ?EKG ?None ? ?Radiology ?ECHOCARDIOGRAM COMPLETE ? ?Result Date: 07/21/2021 ?   ECHOCARDIOGRAM REPORT   Patient Name:   Todd [name] Date of Exam: 07/21/2021 Medical Rec #:  [id number]   Height:       67.0 in Accession #:    [id number]  Weight:       171.1 lb Date of Birth:  Aug 08, 1960    BSA:          1.892 m? Patient Age:    71 years    BP:           119/82 mmHg Patient Gender: M           HR:           72 bpm. Exam Location:  Inpatient Procedure: 2D Echo, Cardiac Doppler and Color Doppler Indications:    Chest Pain  History:        Patient has no prior history of Echocardiogram examinations.                 CAD; Cath 07/17/21.  Sonographer:    Merrie Roof RDCS Referring Phys: [id number] [place]  1. Left ventricular ejection fraction, by estimation, is 65 to 70%. The left ventricle has normal function. The left ventricle has no regional wall motion abnormalities. Left ventricular diastolic parameters are indeterminate.  2. Right ventricular systolic function is normal. The right ventricular size is normal. Tricuspid regurgitation signal is inadequate for assessing PA pressure.  3. The mitral valve is normal in structure. No evidence of mitral valve regurgitation. No evidence of mitral stenosis.  4. The aortic valve has an indeterminant number of cusps. Aortic valve regurgitation is mild. FINDINGS  Left Ventricle: Left ventricular ejection fraction, by estimation, is 65 to 70%. The left ventricle has normal function. The left ventricle has no regional wall motion abnormalities. The left ventricular internal cavity size was normal in size. There is  no left ventricular hypertrophy. Left ventricular diastolic parameters are indeterminate. Normal left ventricular filling pressure. Right Ventricle: The right ventricular size is normal. Right vetricular wall thickness  was not well visualized. Right ventricular systolic function is normal. Tricuspid regurgitation signal is inadequate for assessing PA pressure. Left Atrium: Left atrial size was normal in size. Right Atrium: Right atrial size was normal in size. Pericardium: There is no evidence of pericardial effusion. Mitral Valve: The mitral valve is normal in structure. No evidence of mitral valve regurgitation. No evidence of mitral valve stenosis. Tricuspid Valve: The tricuspid valve is normal in structure. Tricuspid valve regurgitation is  not demonstrated. No evidence of tricuspid stenosis. Aortic Valve: The aortic valve has an indeterminant number of cusps. Aortic valve regurgitation is mild. Aortic valve mean gradient measures 3.3 mmHg. Aortic valve peak gradient measures 6.7 mmHg. Aortic valve area, by VTI measures 3.12 cm?. Pulmonic Valve: The pulmonic valve was not well visualized. Pulmonic valve regurgitation is not visualized. No evidence of pulmonic stenosis. Aorta: The aortic root is normal in size and structure. IAS/Shunts: The interatrial septum was not well visualized.  LEFT VENTRICLE PLAX 2D LVIDd:         4.20 cm   Diastology LVIDs:         3.10 cm   LV e' medial:    9.15 cm/s LV PW:         0.90 cm   LV E/e' medial:  5.9 LV IVS:        0.80 cm   LV e' lateral:   11.50 cm/s LVOT diam:     2.20 cm   LV E/e' lateral: 4.7 LV SV:         82 LV SV Index:   43 LVOT Area:     3.80 cm?  RIGHT VENTRICLE RV Basal diam:  3.20 cm RV S prime:     13.20 cm/s TAPSE (M-mode): 1.6 cm LEFT ATRIUM             Index        RIGHT ATRIUM          Index LA diam:        4.00 cm 2.11 cm/m?   RA Area:     9.94 cm? LA Vol (A2C):   51.3 ml 27.11 ml/m?  RA Volume:   17.40 ml 9.19 ml/m? LA Vol (A4C):   54.0 ml 28.54 ml/m? LA Biplane Vol: 53.5 ml 28.27 ml/m?  AORTIC VALVE AV Area (Vmax):    2.67 cm? AV Area (Vmean):   2.81 cm? AV Area (VTI):     3.12 cm? AV Vmax:           129.62 cm/s AV Vmean:          85.106 cm/s AV VTI:            0.263 m  AV Peak Grad:      6.7 mmHg AV Mean Grad:      3.3 mmHg LVOT Vmax:         91.20 cm/s LVOT Vmean:        62.900 cm/s LVOT VTI:          0.216 m LVOT/AV VTI ratio: 0.82  AORTA Ao Root diam: 3.10 cm MITRAL VALV

## 2021-07-24 ENCOUNTER — Emergency Department (HOSPITAL_COMMUNITY)
Admission: EM | Admit: 2021-07-24 | Discharge: 2021-07-24 | Disposition: A | Discharge status: Home / Self Care | Payer: 59 | Attending: Emergency Medicine | Admitting: Emergency Medicine

## 2021-07-24 ENCOUNTER — Other Ambulatory Visit: Payer: Self-pay

## 2021-07-24 DIAGNOSIS — Z7902 Long term (current) use of antithrombotics/antiplatelets: Secondary | ICD-10-CM | POA: Insufficient documentation

## 2021-07-24 DIAGNOSIS — Z7982 Long term (current) use of aspirin: Secondary | ICD-10-CM | POA: Insufficient documentation

## 2021-07-24 DIAGNOSIS — K649 Unspecified hemorrhoids: Secondary | ICD-10-CM | POA: Diagnosis present

## 2021-07-24 DIAGNOSIS — Z79899 Other long term (current) drug therapy: Secondary | ICD-10-CM | POA: Insufficient documentation

## 2021-07-24 DIAGNOSIS — I2510 Atherosclerotic heart disease of native coronary artery without angina pectoris: Secondary | ICD-10-CM | POA: Diagnosis not present

## 2021-07-24 LAB — TROPONIN I (HIGH SENSITIVITY): Troponin I (High Sensitivity): 19 ng/L — ABNORMAL HIGH (ref <18)

## 2021-07-24 MED ORDER — HYDROCORTISONE (PERIANAL) 2.5 % EX CREA: 1.0000 "application " | TOPICAL_CREAM | Freq: Two times a day (BID) | CUTANEOUS | 0 refills | Status: DC

## 2021-07-24 NOTE — ED Triage Notes (Signed)
Pt just discharged from [facility] for evaluation of CP and bleeding hemorrhoid. Pt states that once he got home he noticed scant blood from his rectum. Pt is on Plavix.  ?

## 2021-07-24 NOTE — ED Provider Notes (Signed)
?[facility] ?Provider Note ? ? ?CSN: [id number] ?Arrival date & time: 07/23/21  1923 ? ?  ? ?History ? ?Chief Complaint  ?Patient presents with  ? Chest Pain  ? ? ?Todd [name] is a 61 y.o. male. ? ? ?Chest Pain ? ?Patient with medical history notable for CAD status post recent PCI/DES to LAD 07/17/2021, hyperlipidemia, CVA, GERD presenting today due to chest pain.  Patient states he has been having intermittent left-sided chest pain since the stent placement last week.  The pain is left-sided, unable to identify any provoking feature.  It typically feels sharp, does not radiate.  It lasts for about 15 minutes then self resolves.  Denies any nausea or vomiting.  States when it occurs he feels like "I am not getting enough oxygen to my lungs".  He is taking his home medicine, voices concern that his blood pressure medicine may be making his breathing worse.  He is currently chest pain-free currently.  He is on dual platelet antiplatelet therapy. ? ?Of note, patient was seen yesterday due to concerns about bleeding hemorrhoids.  Denies any issues with rectal bleeding today. ? ?Home Medications ?Prior to Admission medications   ?Medication Sig Start Date End Date Taking? Authorizing Provider  ?aspirin EC 81 MG tablet Take 81 mg by mouth daily. Swallow whole.   Yes [provider]  ?clopidogrel (PLAVIX) 75 MG tablet Take 1 tablet (75 mg total) by mouth daily with breakfast. 07/18/21  Yes Sande Rives E, PA-C  ?metoprolol succinate (TOPROL XL) 25 MG 24 hr tablet Take 1 tablet (25 mg total) by mouth daily. 07/10/21  Yes Loel Dubonnet, NP  ?nitroGLYCERIN (NITROSTAT) 0.4 MG SL tablet Place 1 tablet (0.4 mg total) under the tongue every 5 (five) minutes as needed for chest pain. 07/12/21 10/10/21 Yes Loel Dubonnet, NP  ?rosuvastatin (CRESTOR) 20 MG tablet Take 1 tablet (20 mg total) by mouth daily. 07/18/21  Yes Sande Rives E, PA-C  ?polyethylene glycol (MIRALAX) 17 g  packet Take 17 g by mouth 2 (two) times daily. 07/23/21   Evlyn Courier, PA-C  ?senna (SENOKOT) 8.6 MG TABS tablet Take 1 tablet (8.6 mg total) by mouth in the morning and at bedtime. 07/23/21   Evlyn Courier, PA-C  ?   ? ?Allergies    ?Wellbutrin [bupropion hcl]   ? ?Review of Systems   ?Review of Systems  ?Cardiovascular:  Positive for chest pain.  ? ?Physical Exam ?Updated Vital Signs ?BP 113/73   Pulse (!) 55   Temp 97.6 ?F (36.4 ?C) (Oral)   Resp 16   SpO2 98%  ?Physical Exam ?Vitals and nursing note reviewed. Exam conducted with a chaperone present.  ?Constitutional:   ?   Appearance: Normal appearance.  ?HENT:  ?   Head: Normocephalic and atraumatic.  ?Eyes:  ?   General: No scleral icterus.    ?   Right eye: No discharge.     ?   Left eye: No discharge.  ?   Extraocular Movements: Extraocular movements intact.  ?   Pupils: Pupils are equal, round, and reactive to light.  ?Cardiovascular:  ?   Rate and Rhythm: Normal rate and regular rhythm.  ?   Pulses: Normal pulses.  ?   Heart sounds: Normal heart sounds. No murmur heard. ?  No friction rub. No gallop.  ?Pulmonary:  ?   Effort: Pulmonary effort is normal. No respiratory distress.  ?   Breath sounds: Normal breath sounds.  ?  Abdominal:  ?   General: Abdomen is flat. Bowel sounds are normal. There is no distension.  ?   Palpations: Abdomen is soft.  ?   Tenderness: There is no abdominal tenderness.  ?Skin: ?   General: Skin is warm and dry.  ?   Coloration: Skin is not jaundiced.  ?Neurological:  ?   Mental Status: He is alert. Mental status is at baseline.  ?   Coordination: Coordination normal.  ? ? ?ED Results / Procedures / Treatments   ?Labs ?(all labs ordered are listed, but only abnormal results are displayed) ?Labs Reviewed  ?BASIC METABOLIC PANEL - Abnormal; Notable for the following components:  ?    Result Value  ? Glucose, Bld 108 (*)   ? All other components within normal limits  ?TROPONIN I (HIGH SENSITIVITY) - Abnormal; Notable for the following  components:  ? Troponin I (High Sensitivity) 19 (*)   ? All other components within normal limits  ?TROPONIN I (HIGH SENSITIVITY) - Abnormal; Notable for the following components:  ? Troponin I (High Sensitivity) 19 (*)   ? All other components within normal limits  ?CBC  ? ? ?EKG ?None ? ?Radiology ?DG Chest 2 View ? ?Result Date: 07/23/2021 ?CLINICAL DATA:  Left chest pain. EXAM: CHEST - 2 VIEW COMPARISON:  Chest x-ray 07/20/2021. FINDINGS: The heart size and mediastinal contours are within normal limits. Both lungs are clear. The visualized skeletal structures are unremarkable. IMPRESSION: No active cardiopulmonary disease. Electronically Signed   By: Ronney Asters M.D.   On: 07/23/2021 22:00   ? ?Procedures ?Procedures  ? ? ?Medications Ordered in ED ?Medications - No data to display ? ?ED Course/ Medical Decision Making/ A&P ?  ?                        ?Medical Decision Making ?Amount and/or Complexity of Data Reviewed ?Labs: ordered. ?Radiology: ordered. ? ? ?This patient presents to the ED for concern of chest pain, this involves an extensive number of treatment options, and is a complaint that carries with it a high risk of complications and morbidity.  The differential diagnosis includes ACS, PE, PNA, GERD, Anxiety ? ?Additional history obtained:  ? ?I reviewed external records from his cardiology including his admission and discharge from 4/26 through 4/29.  Patient presented with similar symptoms as today, troponins were in the 400/500 range at time of his admission. ?  ?Lab Tests: ? ?I ordered, viewed, and personally interpreted labs.  The pertinent results include: CBC without leukocytosis or anemia.  No gross electrolyte derangement or AKI.  Troponin 19, delta troponin 0. Improved from 4 days per chart review.  ? ?  ?Imaging Studies ordered: ? ?I directly visualized the CXR, which showed no acute process ? ?I agree with the radiologist interpretation ?  ? ?ECG/Cardiac monitoring:  ? ?Per my  interpretation, EKG shows early R wave progression but no acute ischemic findings or reciprocal changes.  ? ?The patient was maintained on a cardiac monitor.  Visualized monitor strip which showed NSR per my interpretation.  ? ? ?Medicines ordered and prescription drug management: ? ? ?I have reviewed the patients home medicines and have made adjustments as needed ? ? ?Test Considered: ?Considered admission for observation but given neg delta Trop, asymptomatic, no ischemic or reciprocal changes on EKG do not necessarily feel this is imperative.  Discussed with patient, he states he prefer to follow-up with cardiology outpatient given he is asymptomatic.  ? ? ?  Reevaluation: ? ?After the interventions noted above, I reevaluated the patient and found patient remains asymptomatic.  ? ? ?Problems addressed / ED Course: ? ?  ?Social Determinants of Health: ?Cardiology F/U ?  ?Disposition: ? ? ?After consideration of the diagnostic results and the patients response to treatment, I feel that the patent would benefit from outpatient cardiology follow-up. ? ?Discussed HPI, physical exam and plan of care for this patient with attending Gerlene Fee. The attending physician evaluated this patient as part of a shared visit and agrees with plan of care. ? ? ? ? ? ? ? ? ?Final Clinical Impression(s) / ED Diagnoses ?Final diagnoses:  ?None  ? ? ?Rx / DC Orders ?ED Discharge Orders   ? ? None  ? ?  ? ? ?  ?Sherrill Raring, PA-C ?07/24/21 5015 ? ?  ?Maudie Flakes, MD ?07/24/21 [phone] ? ?

## 2021-07-24 NOTE — Discharge Instructions (Signed)
Continue taking your home medicine  ?Follow-up with cardiology, call tomorrow to schedule your ED follow-up visit. ?Return for new or worsening symptoms. ?

## 2021-07-24 NOTE — ED Provider Notes (Signed)
?[facility] ?Provider Note ? ? ?CSN: [id number] ?Arrival date & time: 07/24/21  0502 ? ?  ? ?History ? ?Chief Complaint  ?Patient presents with  ? Rectal Bleeding  ? ? ?Todd [name] is a 61 y.o. male with history significant for CAD status post PCI/DCS to LAD 07/17/2021, hyperlipidemia, GERD, CVA and recently diagnosed amyloid presents to the ED today for evaluation of bleeding from the hemorrhoid.  Patient was just discharged from the hospital yesterday with complaints of chest pain and for his hemorrhoid.  He had very reassuring work-up without need for acute intervention.  Patient states that he went home after being discharged and noticed trace amounts of blood leaking from his rectum.  He is concerned about the bleeding and return to the ED.  Patient was discharged home with prescription for MiraLAX and Senokot but has not taken either of these yet.  Patient denies all other complaints. ? ? ?Rectal Bleeding ? ?  ? ?Home Medications ?Prior to Admission medications   ?Medication Sig Start Date End Date Taking? Authorizing Provider  ?hydrocortisone (ANUSOL-HC) 2.5 % rectal cream Place 1 application. rectally 2 (two) times daily. 07/24/21  Yes Kathe Becton R, PA-C  ?aspirin EC 81 MG tablet Take 81 mg by mouth daily. Swallow whole.    [provider]  ?clopidogrel (PLAVIX) 75 MG tablet Take 1 tablet (75 mg total) by mouth daily with breakfast. 07/18/21   Sande Rives E, PA-C  ?metoprolol succinate (TOPROL XL) 25 MG 24 hr tablet Take 1 tablet (25 mg total) by mouth daily. 07/10/21   Loel Dubonnet, NP  ?nitroGLYCERIN (NITROSTAT) 0.4 MG SL tablet Place 1 tablet (0.4 mg total) under the tongue every 5 (five) minutes as needed for chest pain. 07/12/21 10/10/21  Loel Dubonnet, NP  ?polyethylene glycol (MIRALAX) 17 g packet Take 17 g by mouth 2 (two) times daily. 07/23/21   Evlyn Courier, PA-C  ?rosuvastatin (CRESTOR) 20 MG tablet Take 1 tablet (20 mg total) by mouth daily.  07/18/21   Darreld Mclean, PA-C  ?senna (SENOKOT) 8.6 MG TABS tablet Take 1 tablet (8.6 mg total) by mouth in the morning and at bedtime. 07/23/21   Evlyn Courier, PA-C  ?   ? ?Allergies    ?Wellbutrin [bupropion hcl]   ? ?Review of Systems   ?Review of Systems  ?Gastrointestinal:  Positive for hematochezia.  ? ?Physical Exam ?Updated Vital Signs ?BP 134/87 (BP Location: Left Arm)   Pulse (!) 101   Temp 98 ?F (36.7 ?C)   Resp 18   Ht '5\' 7"'$  (1.702 m)   Wt 77.1 kg   SpO2 99%   BMI 26.63 kg/m?  ?Physical Exam ?Vitals and nursing note reviewed.  ?Constitutional:   ?   General: He is not in acute distress. ?HENT:  ?   Head: Atraumatic.  ?Eyes:  ?   Conjunctiva/sclera: Conjunctivae normal.  ?Cardiovascular:  ?   Rate and Rhythm: Normal rate and regular rhythm.  ?   Pulses: Normal pulses.  ?   Heart sounds: No murmur heard. ?Pulmonary:  ?   Effort: Pulmonary effort is normal. No respiratory distress.  ?   Breath sounds: Normal breath sounds.  ?Abdominal:  ?   General: Abdomen is flat. There is no distension.  ?   Palpations: Abdomen is soft.  ?   Tenderness: There is no abdominal tenderness.  ?Musculoskeletal:     ?   General: Normal range of motion.  ?  Cervical back: Normal range of motion.  ?Skin: ?   General: Skin is warm and dry.  ?   Capillary Refill: Capillary refill takes less than 2 seconds.  ?Neurological:  ?   General: No focal deficit present.  ?   Mental Status: He is alert.  ?Psychiatric:     ?   Mood and Affect: Mood normal.  ? ? ?ED Results / Procedures / Treatments   ?Labs ?(all labs ordered are listed, but only abnormal results are displayed) ?Labs Reviewed - No data to display ? ?EKG ?None ? ?Radiology ?DG Chest 2 View ? ?Result Date: 07/23/2021 ?CLINICAL DATA:  Left chest pain. EXAM: CHEST - 2 VIEW COMPARISON:  Chest x-ray 07/20/2021. FINDINGS: The heart size and mediastinal contours are within normal limits. Both lungs are clear. The visualized skeletal structures are unremarkable. IMPRESSION: No  active cardiopulmonary disease. Electronically Signed   By: Ronney Asters M.D.   On: 07/23/2021 22:00   ? ?Procedures ?Procedures  ? ? ?Medications Ordered in ED ?Medications - No data to display ? ?ED Course/ Medical Decision Making/ A&P ?  ?                        ?Medical Decision Making ?Risk ?Prescription drug management. ? ? ?61 year old male in no acute distress, nontoxic-appearing presents to the ED for evaluation of rectal bleeding from hemorrhoid.  Vitals without acute abnormalities.  Physical exam reassuring although patient does appear tired.  Patient notes just trace amounts of blood noted after he was discharged.  Currently not bleeding.  Advised patient that bleeding from hemorrhoids is quite common, particularly since he has been started on Plavix and may be more prone to bleeding.  Advised him to take the prescribed medications.  Also given prescription for rectal hemorrhoid cream to use as needed.  Discussed strict return precautions.  Patient expresses understanding and is amenable to plan.  Discharged home in good condition. ? ?Final Clinical Impression(s) / ED Diagnoses ?Final diagnoses:  ?Hemorrhoids, unspecified hemorrhoid type  ? ? ?Rx / DC Orders ?ED Discharge Orders   ? ?      Ordered  ?  hydrocortisone (ANUSOL-HC) 2.5 % rectal cream  2 times daily       ? 07/24/21 0840  ? ?  ?  ? ?  ? ? ?  ?Tonye Pearson, [place] ?07/24/21 3291 ? ?  ?Blanchie Dessert, MD ?07/24/21 1437 ? ?

## 2021-07-24 NOTE — ED Notes (Signed)
Patient verbalizes understanding of d/c instructions. Opportunities for questions and answers were provided. Pt d/c from ED and ambulated to lobby.  

## 2021-07-24 NOTE — ED Provider Triage Note (Signed)
Emergency Medicine Provider Triage Evaluation Note ? ?Todd [name] , a 61 y.o. male  was evaluated in triage.  Pt complains of rectal bleeding that started soon after he was discharged.  Patient was just evaluated in the emergency room for unstable angina.  Patient states soon after his discharge he has spontaneous breathing.  Patient was seen for hemorrhoid bleeding like prior and had control of the bleeding with quick clot.  Patient states since he left following that visit he has not had a bowel movement.  He has not started his bowel regimen.  He did call the surgeons office and was told they would call him back to schedule an appointment. ? ?Review of Systems  ?Positive: As above ?Negative: As above ? ?Physical Exam  ?BP 134/87 (BP Location: Left Arm)   Pulse (!) 101   Temp 98 ?F (36.7 ?C)   Resp 18   SpO2 99%  ?Gen:   Awake, no distress   ?Resp:  Normal effort  ?MSK:   Moves extremities without difficulty  ?Other:  Hemorrhoids present on rectal exam.  Recent bleeding noted.  No active bleeding. ? ?Medical Decision Making  ?Medically screening exam initiated at 5:35 AM.  Appropriate orders placed.  Todd [name] was informed that the remainder of the evaluation will be completed by another provider, this initial triage assessment does not replace that evaluation, and the importance of remaining in the ED until their evaluation is complete. ? ?Given patient was she was evaluated in the emergency room and had blood work done will defer this at this time.  No significant blood loss evident on exam.  Patient states his bleeding is not as severe as it was yesterday. ?  Evlyn Courier, PA-C ?07/24/21 5300 ? ?

## 2021-07-24 NOTE — Discharge Instructions (Addendum)
Your bleeding is secondary to a hemorrhoid, which can take several days to heal. Take the prescriptions prescribed to you yesterday, and I have also sent you in a cream which can help with some discomfort. Insert that rectally twice daily. A small amount of bleeding is to be expected with hemorrhoids, especially since you are on plavix. Your PCP will be able to manage this condition as well if it does not improve. If you have uncontrollable bleeding or develop worsening chest pain, weakness or shortness of breath, return to the ED. ?

## 2021-07-25 NOTE — Telephone Encounter (Signed)
Left message for patient to call and schedule post ED visit follow up appointment----per 07/21/21 staff message from Fabian Sharp, [place]  ?

## 2021-07-26 ENCOUNTER — Telehealth (HOSPITAL_COMMUNITY): Payer: Self-pay

## 2021-07-26 NOTE — Telephone Encounter (Signed)
Attempted to call patient in regards to Cardiac Rehab - Unable to leave VM. ?

## 2021-07-26 NOTE — Telephone Encounter (Signed)
Pt insurance is active and benefits verified through [employer] Co-pay 0, DED $800/$800 met, out of pocket $3,000/$2,974.10 met, co-insurance 30%. no pre-authorization required. Passport, 07/26/2021_0 :25am, REF# [id number] ? ?Will contact patient to see if he is interested in the Cardiac Rehab Program. If interested, patient will need to complete follow up appt. Once completed, patient will be contacted for scheduling upon review by the RN Navigator. ?

## 2021-07-29 ENCOUNTER — Emergency Department (HOSPITAL_COMMUNITY)
Admission: EM | Admit: 2021-07-29 | Discharge: 2021-07-29 | Disposition: A | Discharge status: Home / Self Care | Payer: 59 | Attending: Emergency Medicine | Admitting: Emergency Medicine

## 2021-07-29 ENCOUNTER — Emergency Department (HOSPITAL_COMMUNITY): Payer: 59

## 2021-07-29 ENCOUNTER — Encounter (HOSPITAL_COMMUNITY): Payer: Self-pay

## 2021-07-29 ENCOUNTER — Other Ambulatory Visit: Payer: Self-pay

## 2021-07-29 DIAGNOSIS — R0602 Shortness of breath: Secondary | ICD-10-CM | POA: Diagnosis not present

## 2021-07-29 DIAGNOSIS — R079 Chest pain, unspecified: Secondary | ICD-10-CM | POA: Diagnosis present

## 2021-07-29 DIAGNOSIS — Z7902 Long term (current) use of antithrombotics/antiplatelets: Secondary | ICD-10-CM | POA: Insufficient documentation

## 2021-07-29 DIAGNOSIS — I2510 Atherosclerotic heart disease of native coronary artery without angina pectoris: Secondary | ICD-10-CM | POA: Diagnosis not present

## 2021-07-29 DIAGNOSIS — Z7982 Long term (current) use of aspirin: Secondary | ICD-10-CM | POA: Diagnosis not present

## 2021-07-29 DIAGNOSIS — Z8673 Personal history of transient ischemic attack (TIA), and cerebral infarction without residual deficits: Secondary | ICD-10-CM | POA: Insufficient documentation

## 2021-07-29 LAB — BASIC METABOLIC PANEL
Anion gap: 5 (ref 5–15)
BUN: 14 mg/dL (ref 6–20)
CO2: 25 mmol/L (ref 22–32)
Calcium: 9 mg/dL (ref 8.9–10.3)
Chloride: 108 mmol/L (ref 98–111)
Creatinine, Ser: 1.08 mg/dL (ref 0.61–1.24)
GFR, Estimated: >60 mL/min (ref >60)
Glucose, Bld: 94 mg/dL (ref 70–99)
Potassium: 3.7 mmol/L (ref 3.5–5.1)
Sodium: 138 mmol/L (ref 135–145)

## 2021-07-29 LAB — CBC
HCT: 42.1 % (ref 39.0–52.0)
Hemoglobin: 14.6 g/dL (ref 13.0–17.0)
MCH: 30.4 pg (ref 26.0–34.0)
MCHC: 34.7 g/dL (ref 30.0–36.0)
MCV: 87.7 fL (ref 80.0–100.0)
Platelets: 294 10*3/uL (ref 150–400)
RBC: 4.8 MIL/uL (ref 4.22–5.81)
RDW: 12 % (ref 11.5–15.5)
WBC: 8.6 10*3/uL (ref 4.0–10.5)
nRBC: 0 % (ref 0.0–0.2)

## 2021-07-29 LAB — TROPONIN I (HIGH SENSITIVITY)
Troponin I (High Sensitivity): 3 ng/L (ref <18)
Troponin I (High Sensitivity): 3 ng/L (ref <18)

## 2021-07-29 NOTE — Discharge Instructions (Signed)
Your cardiac work-up today was normal. ?I have sent a message to Dr. Debara Pickett to try and expedite your follow-up appointment.  If you do not hear from the clinic in a few days, call their office to follow-up on this. ?Return to the ED for new or worsening symptoms. ?

## 2021-07-29 NOTE — ED Triage Notes (Signed)
Pt is here today due to sharp/stabbing pain in he center of his chest . Pt reports stent placement 12 days ago. Pt reports nausea with chest pain & mild sob.  ?

## 2021-07-29 NOTE — ED Provider Notes (Signed)
?[facility] ?Provider Note ? ? ?CSN: [id number] ?Arrival date & time: 07/29/21  0149 ? ?  ? ?History ? ?Chief Complaint  ?Patient presents with  ? Chest Pain  ? ? ?Todd [name] is a 61 y.o. male. ? ?The history is provided by the patient and medical records.  ?Chest Pain ? ?61 y.o. M with hx of TIA, prior stroke, CAD with PCI to LAD with single DES on 07/20/21, presenting to the ED with chest pain.  This is his 4th ED visit in the past week since discharge from the hospital.  He states he keeps waking up and having chest pain which scares him.  He reports tonight pain began about 1.5 hours PTA, mid-left chest.  He reports some SOB but denies nausea, vomiting, diaphoresis, dizziness, etc.  He denies smoking or drug use.  No sick contacts, cough, or fever.  He states he keeps coming to the ER and being told everything is ok but he is scared and does not know what else to do.  He has not followed up with his cardiologist since his cath-- states appt not until August 2023. ? ?Home Medications ?Prior to Admission medications   ?Medication Sig Start Date End Date Taking? Authorizing Provider  ?aspirin EC 81 MG tablet Take 81 mg by mouth daily. Swallow whole.   Yes [provider]  ?clopidogrel (PLAVIX) 75 MG tablet Take 1 tablet (75 mg total) by mouth daily with breakfast. 07/18/21  Yes Sande Rives E, PA-C  ?hydrocortisone (ANUSOL-HC) 2.5 % rectal cream Place 1 application. rectally 2 (two) times daily. 07/24/21  Yes Kathe Becton R, PA-C  ?metoprolol succinate (TOPROL XL) 25 MG 24 hr tablet Take 1 tablet (25 mg total) by mouth daily. 07/10/21  Yes Loel Dubonnet, NP  ?rosuvastatin (CRESTOR) 20 MG tablet Take 1 tablet (20 mg total) by mouth daily. 07/18/21  Yes Sande Rives E, PA-C  ?nitroGLYCERIN (NITROSTAT) 0.4 MG SL tablet Place 1 tablet (0.4 mg total) under the tongue every 5 (five) minutes as needed for chest pain. 07/12/21 10/10/21  Loel Dubonnet, NP   ?polyethylene glycol (MIRALAX) 17 g packet Take 17 g by mouth 2 (two) times daily. ?Patient not taking: Reported on 07/29/2021 07/23/21   Evlyn Courier, PA-C  ?senna (SENOKOT) 8.6 MG TABS tablet Take 1 tablet (8.6 mg total) by mouth in the morning and at bedtime. ?Patient not taking: Reported on 07/29/2021 07/23/21   Evlyn Courier, PA-C  ?   ? ?Allergies    ?Wellbutrin [bupropion hcl]   ? ?Review of Systems   ?Review of Systems  ?Cardiovascular:  Positive for chest pain.  ?All other systems reviewed and are negative. ? ?Physical Exam ?Updated Vital Signs ?BP 116/76   Pulse 63   Temp 97.8 ?F (36.6 ?C) (Oral)   Resp 17   SpO2 97%  ? ?Physical Exam ?Vitals and nursing note reviewed.  ?Constitutional:   ?   Appearance: He is well-developed.  ?HENT:  ?   Head: Normocephalic and atraumatic.  ?Eyes:  ?   Conjunctiva/sclera: Conjunctivae normal.  ?   Pupils: Pupils are equal, round, and reactive to light.  ?Cardiovascular:  ?   Rate and Rhythm: Normal rate and regular rhythm.  ?   Heart sounds: Normal heart sounds.  ?Pulmonary:  ?   Effort: Pulmonary effort is normal.  ?   Breath sounds: Normal breath sounds.  ?Abdominal:  ?   General: Bowel sounds are normal.  ?  Palpations: Abdomen is soft.  ?Musculoskeletal:     ?   General: Normal range of motion.  ?   Cervical back: Normal range of motion.  ?Skin: ?   General: Skin is warm and dry.  ?Neurological:  ?   Mental Status: He is alert and oriented to person, place, and time.  ? ? ?ED Results / Procedures / Treatments   ?Labs ?(all labs ordered are listed, but only abnormal results are displayed) ?Labs Reviewed  ?BASIC METABOLIC PANEL  ?CBC  ?TROPONIN I (HIGH SENSITIVITY)  ?TROPONIN I (HIGH SENSITIVITY)  ? ? ?EKG ?EKG Interpretation ? ?Date/Time:  Monday Jul 29 2021 01:59:53 EDT ?Ventricular Rate:  87 ?PR Interval:  150 ?QRS Duration: 88 ?QT Interval:  380 ?QTC Calculation: 457 ?R Axis:   33 ?Text Interpretation: Normal sinus rhythm Normal ECG Confirmed by Ripley Fraise [phone]) on  07/29/2021 2:03:28 AM ? ?Radiology ?DG Chest 2 View ? ?Result Date: 07/29/2021 ?CLINICAL DATA:  Chest pain. EXAM: CHEST - 2 VIEW COMPARISON:  Chest radiograph dated 07/23/2021. FINDINGS: No focal consolidation, pleural effusion, pneumothorax. The cardiac silhouette is within limits. Coronary vascular stent. Atherosclerotic calcification of the aorta. No acute osseous pathology. IMPRESSION: No active cardiopulmonary disease. Electronically Signed   By: Anner Crete M.D.   On: 07/29/2021 03:00   ? ?Procedures ?Procedures  ? ? ?Medications Ordered in ED ?Medications - No data to display ? ?ED Course/ Medical Decision Making/ A&P ?  ?                        ?Medical Decision Making ?Amount and/or Complexity of Data Reviewed ?Labs: ordered. ?Radiology: ordered and independent interpretation performed. ?ECG/medicine tests: ordered and independent interpretation performed. ? ? ?61 year old male presenting to the ED with chest pain.  Had PCI to LAD with single DES on 07/20/2021.  [facility] ED visit since that time for recurrent chest pain.  EKG today unchanged from prior, no acute ischemia.  Initial labs reviewed and overall reassuring.  Troponin negative at 3.  Chest x-ray is clear.  Given his history, will obtain delta troponin. ? ?Delta troponin remains negative at 3.  Results discussed with patient.  He states he cannot understand why he continues having chest pain.  Will send message to cardiology group to try to expedite follow-up rather than waiting until his appointment in August.  He understands to follow-up with the clinic if he does not hear from them in the next few days.  He can return here for any new or acute changes. ? ?Final Clinical Impression(s) / ED Diagnoses ?Final diagnoses:  ?Chest pain in adult  ? ? ?Rx / DC Orders ?ED Discharge Orders   ? ?      Ordered  ?  Ambulatory referral to Cardiology       ?Comments: Stent placed 07/20/21.  4 ED visits since then with ongoing chest pain. Next appt not scheduled  until august. Needs urgent follow-up.  ? 07/29/21 0534  ? ?  ?  ? ?  ? ? ?  ?Larene Pickett, PA-C ?07/29/21 0539 ? ?  ?Ripley Fraise, MD ?07/29/21 [phone] ? ?

## 2021-07-31 ENCOUNTER — Ambulatory Visit (INDEPENDENT_AMBULATORY_CARE_PROVIDER_SITE_OTHER): Payer: 59 | Admitting: Internal Medicine

## 2021-07-31 ENCOUNTER — Ambulatory Visit (INDEPENDENT_AMBULATORY_CARE_PROVIDER_SITE_OTHER): Payer: 59 | Admitting: Pulmonary Disease

## 2021-07-31 ENCOUNTER — Encounter (HOSPITAL_BASED_OUTPATIENT_CLINIC_OR_DEPARTMENT_OTHER): Payer: Self-pay | Admitting: Internal Medicine

## 2021-07-31 ENCOUNTER — Encounter: Payer: Self-pay | Admitting: Pulmonary Disease

## 2021-07-31 VITALS — BP 110/65 | HR 64 | Ht 67.0 in | Wt 167.5 lb

## 2021-07-31 VITALS — BP 120/78 | HR 66 | Temp 98.0°F | Ht 67.0 in | Wt 166.0 lb

## 2021-07-31 DIAGNOSIS — Z955 Presence of coronary angioplasty implant and graft: Secondary | ICD-10-CM

## 2021-07-31 DIAGNOSIS — R072 Precordial pain: Secondary | ICD-10-CM

## 2021-07-31 DIAGNOSIS — I25118 Atherosclerotic heart disease of native coronary artery with other forms of angina pectoris: Secondary | ICD-10-CM | POA: Diagnosis not present

## 2021-07-31 DIAGNOSIS — R0609 Other forms of dyspnea: Secondary | ICD-10-CM

## 2021-07-31 DIAGNOSIS — E785 Hyperlipidemia, unspecified: Secondary | ICD-10-CM

## 2021-07-31 MED ORDER — ALPRAZOLAM 0.5 MG PO TABS: 0.5000 mg | ORAL_TABLET | Freq: Two times a day (BID) | ORAL | 2 refills | Status: DC | PRN

## 2021-07-31 NOTE — Progress Notes (Signed)
? ? ?OFFICE NOTE ? ?Chief Complaint:  ?DOE, palpitations ? ?Primary Care Physician: ?Wendie Agreste, MD ? ?HPI:  ?Todd [name] is a 61 year old male who was previously seen at [facility] heart and vascular by Dr. Elisabeth Cara. He underwent a stress test, echocardiogram and monitor which were unrevealing. He was told he was well and did not need to follow back up with [place]. He recently had an episode where he had some visual abnormalities including seeing white spots. He presented to the emergency room and there was concern for TIA or stroke. Workup was generally unremarkable except for some cerebral atherosclerosis. It was also considered that he might have had a migraine although he had no headache and has no history of headaches. He currently denies any chest pain although does get some mild shortness of breath with exertion. 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 He does have a family history of heart disease in his father who apparently had an enlarged heart and fluid around the heart. He also has a history of anxiety and was briefly a medication for anxiety and depression in the past. He takes Lipitor for elevated LDL cholesterol recently which was 130. ? ?I saw [name] back in the office today. He denies any worsening palpitations. He was in the emergency room apparently this morning with blurred vision and seeing spots. He also had a headache on the right side of his head. He left apparently before being fully evaluated. He's had episodes like this before. There is question is whether this could be complicated migraine or TIA. He denies any chest pain or worsening shortness of breath. ? ?11/12/2016 ? ?[name] was seen today in follow-up. He was recently seen in the ER for chest discomfort. He says he occasionally gets some palpitations which are short-lived and chest discomfort. He's also been having visual changes. That is typically  followed by headache. He underwent neurology evaluation in the past for this as previously described in my notes. MRI showed some cerebral atherosclerosis but was felt to have no evidence of stroke. It was thought that his symptoms are related to migraine. He reports chronic stress and running his own business. She's not currently on medications. He was prescribed some medications for migraines ever since his symptoms improved he stopped taking them and did not follow-up with the neurologist. With regard to the chest pain it is mostly sharp, not necessarily associated with exertion or relieved by rest. Sometimes associated with palpitations.  ? ?01/04/2021 ? ?[name] not is seen today in follow-up.  He is considered a new patient today since he has not been seen in the past 3 years.  He is again having some palpitations.  They occur fairly sporadically but sometimes can be more intense.  We talked a lot about his diet.  He said he does not drink often but he drinks green tea.  There is between 30 to 50 mg of caffeine and every cup of green tea and therefore if he could cut that back or switch to decaffeinated it may be helpful.  He may ultimately need low-dose beta-blocker.  He is also been experiencing shortness of breath per tickly walking upstairs.  This is worsened recently.  He has no history of coronary disease although lipids in August showed significant elevation.  He says his diet is pretty unregulated, meaning he eats what ever he can.  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  He recently started on 5 mg rosuvastatin for this.  Total cholesterol was 238, HDL 46, triglycerides 123 and LDL 167. ? ?07/31/2021 ? ?[name] returns today for follow-up.  He was recently seen a number of times by Laurann Montana, NP for chest pressure.  Ultimately he underwent coronary CT angiography which showed a calcium score of 370, 45th percentile for age and sex matched control, however he  was noted to have moderate stenosis in the mid LAD.  The study was sent for [facility] which suggested flow-limiting stenosis in the mid to distal LAD.  Subsequently he was sent for cardiac catheterization and was found to have an 80% focal stenosis of the mid LAD.  He also had 30% proximal RCA and 30% first marginal stenoses.  He underwent a drug-eluting stent to the LAD which reduced the stenosis to 0.  He was advised to stay on aspirin and Plavix for minimum of 6 months and his rosuvastatin was increased to 20 mg daily.  Subsequently he has had numerous emergency department visits.  He reports his chest pressure was resolved and although he had a little bit of shortness of breath when leaving the hospital that improved as well after a few days.  He has subsequently had numerous episodes of sharp chest pain.  This is described as stabbing and along the left sternal wall.  He also has some discomfort in over the left trapezius area.  These episodes occur on a daily basis, sometimes wake him up at night or in the morning but occurred for example today in the afternoon just prior to his visit.  He is noted occasional low blood pressure with systolic blood pressures in the 90s however was seen this morning by pulmonary and blood pressure was normal.  Blood pressure today is 110/65.  He is on a low-dose of Toprol but no other medications for blood pressure. ? ?PMHx:  ?Past Medical History:  ?Diagnosis Date  ? Complicated migraine   ? Diverticulosis   ? External hemorrhoids   ? External hemorrhoids   ? Headache   ? Hx of cardiovascular stress test   ? Myoview 8/10:  no ischemia, EF 62%; low risk  ? Hx of Doppler ultrasound   ? Carotid US 6/15:  bilat ICA 1-39%  ? Hx of echocardiogram   ? echo 6/15:  EF 60-65%, no RWMA  ? Hypertension   ? Internal hemorrhoids   ? Palpitations   ? Stroke [facility])   ? TIA (transient ischemic attack)   ? Tubular adenoma of colon   ? Vision abnormalities   ? ? ?Past Surgical History:  ?Procedure  Laterality Date  ? APPENDECTOMY  12/01/11  ? lap appy  ? CORONARY STENT INTERVENTION N/A 07/17/2021  ? Procedure: CORONARY STENT INTERVENTION;  Surgeon: Burnell Blanks, MD;  Location: [facility];  Service: Cardiovascular;  Laterality: N/A;  ? LAPAROSCOPIC APPENDECTOMY  12/02/2011  ? Procedure: APPENDECTOMY LAPAROSCOPIC;  Surgeon: Madilyn Hook, DO;  Location: [facility];  Service: General;  Laterality: N/A;  ? LEFT HEART CATH AND CORONARY ANGIOGRAPHY N/A 07/17/2021  ? Procedure: LEFT HEART CATH AND CORONARY ANGIOGRAPHY;  Surgeon: Burnell Blanks, MD;  Location: [facility];  Service: Cardiovascular;  Laterality: N/A;  ? ? ?FAMHx:  ?Family History  ?Problem Relation Age of Onset  ? Hypertension Mother   ? Hypertension Father   ? Anemia Neg Hx   ? Arrhythmia Neg  Hx   ? Asthma Neg Hx   ? Clotting disorder Neg Hx   ? Fainting Neg Hx   ? Heart attack Neg Hx   ? Heart disease Neg Hx   ? Heart failure Neg Hx   ? Hyperlipidemia Neg Hx   ? Colon cancer Neg Hx   ? Esophageal cancer Neg Hx   ? Stomach cancer Neg Hx   ? Rectal cancer Neg Hx   ? ? ?SOCHx:  ? reports that he has never smoked. He has never used smokeless tobacco. He reports that he does not drink alcohol and does not use drugs. ? ?ALLERGIES:  ?Allergies  ?Allergen Reactions  ? Wellbutrin [Bupropion Hcl] Itching and Rash  ? ? ?ROS: ?Pertinent items noted in HPI and remainder of comprehensive ROS otherwise negative. ? ?HOME MEDS: ?Current Outpatient Medications  ?Medication Sig Dispense Refill  ? aspirin EC 81 MG tablet Take 81 mg by mouth daily. Swallow whole.    ? clopidogrel (PLAVIX) 75 MG tablet Take 1 tablet (75 mg total) by mouth daily with breakfast. 90 tablet 2  ? metoprolol succinate (TOPROL XL) 25 MG 24 hr tablet Take 1 tablet (25 mg total) by mouth daily. 90 tablet 3  ? rosuvastatin (CRESTOR) 20 MG tablet Take 1 tablet (20 mg total) by mouth daily. 30 tablet 5  ? ALPRAZolam (XANAX) 0.5 MG tablet Take 1 tablet (0.5 mg total) by mouth  2 (two) times daily as needed for anxiety. (Patient not taking: Reported on 07/31/2021) 90 tablet 2  ? hydrocortisone (ANUSOL-HC) 2.5 % rectal cream Place 1 application. rectally 2 (two) times daily. (Patient not t

## 2021-07-31 NOTE — Progress Notes (Signed)
? ?      Todd [name]    [id number]    28-Nov-1960 ? ?Primary Care Physician:Greene, [name], MD ? ?Referring Physician: Abigail Butts., PA-C ?[address] ?[address] ?[place],  [place] [address] ? ?Chief complaint:   ?Patient being seen for shortness of breath ? ?HPI: ? ?Shortness of breath at rest ? ?Feels he needs to take an extra breath ?Feels his breathing is shallow ? ?Worse since having a stent placed ? ?Does not feel is limited with activities ?He can tolerate exertion without what he feels is significant limitation ? ?Denies any chest pains or chest discomfort ? ?He does feel stressed from work, there may be a component of anxiety ? ?Never smoker ? ?No pertinent occupational history ? ?No family history of lung disease known to him ? ? ?Outpatient Encounter Medications as of 07/31/2021  ?Medication Sig  ? ALPRAZolam (XANAX) 0.5 MG tablet Take 1 tablet (0.5 mg total) by mouth 2 (two) times daily as needed for anxiety.  ? aspirin EC 81 MG tablet Take 81 mg by mouth daily. Swallow whole.  ? clopidogrel (PLAVIX) 75 MG tablet Take 1 tablet (75 mg total) by mouth daily with breakfast.  ? hydrocortisone (ANUSOL-HC) 2.5 % rectal cream Place 1 application. rectally 2 (two) times daily.  ? metoprolol succinate (TOPROL XL) 25 MG 24 hr tablet Take 1 tablet (25 mg total) by mouth daily.  ? nitroGLYCERIN (NITROSTAT) 0.4 MG SL tablet Place 1 tablet (0.4 mg total) under the tongue every 5 (five) minutes as needed for chest pain.  ? polyethylene glycol (MIRALAX) 17 g packet Take 17 g by mouth 2 (two) times daily.  ? rosuvastatin (CRESTOR) 20 MG tablet Take 1 tablet (20 mg total) by mouth daily.  ? senna (SENOKOT) 8.6 MG TABS tablet Take 1 tablet (8.6 mg total) by mouth in the morning and at bedtime.  ? ?No facility-administered encounter medications on file as of 07/31/2021.  ? ? ?Allergies as of 07/31/2021 - Review Complete 07/31/2021  ?Allergen Reaction Noted  ? Wellbutrin [bupropion hcl] Itching and Rash 07/01/2011   ? ? ?Past Medical History:  ?Diagnosis Date  ? Complicated migraine   ? Diverticulosis   ? External hemorrhoids   ? External hemorrhoids   ? Headache   ? Hx of cardiovascular stress test   ? Myoview 8/10:  no ischemia, EF 62%; low risk  ? Hx of Doppler ultrasound   ? Carotid US 6/15:  bilat ICA 1-39%  ? Hx of echocardiogram   ? echo 6/15:  EF 60-65%, no RWMA  ? Hypertension   ? Internal hemorrhoids   ? Palpitations   ? Stroke [facility])   ? TIA (transient ischemic attack)   ? Tubular adenoma of colon   ? Vision abnormalities   ? ? ?Past Surgical History:  ?Procedure Laterality Date  ? APPENDECTOMY  12/01/11  ? lap appy  ? CORONARY STENT INTERVENTION N/A 07/17/2021  ? Procedure: CORONARY STENT INTERVENTION;  Surgeon: Burnell Blanks, MD;  Location: [facility];  Service: Cardiovascular;  Laterality: N/A;  ? LAPAROSCOPIC APPENDECTOMY  12/02/2011  ? Procedure: APPENDECTOMY LAPAROSCOPIC;  Surgeon: Madilyn Hook, DO;  Location: [facility];  Service: General;  Laterality: N/A;  ? LEFT HEART CATH AND CORONARY ANGIOGRAPHY N/A 07/17/2021  ? Procedure: LEFT HEART CATH AND CORONARY ANGIOGRAPHY;  Surgeon: Burnell Blanks, MD;  Location: [facility];  Service: Cardiovascular;  Laterality: N/A;  ? ? ?Family History  ?Problem  Relation Age of Onset  ? Hypertension Mother   ? Hypertension Father   ? Anemia Neg Hx   ? Arrhythmia Neg Hx   ? Asthma Neg Hx   ? Clotting disorder Neg Hx   ? Fainting Neg Hx   ? Heart attack Neg Hx   ? Heart disease Neg Hx   ? Heart failure Neg Hx   ? Hyperlipidemia Neg Hx   ? Colon cancer Neg Hx   ? Esophageal cancer Neg Hx   ? Stomach cancer Neg Hx   ? Rectal cancer Neg Hx   ? ? ?Social History  ? ?Socioeconomic History  ? Marital status: Divorced  ?  Spouse name: Not on file  ? Number of children: Not on file  ? Years of education: Not on file  ? Highest education level: Not on file  ?Occupational History  ? Occupation: part time  ?Tobacco Use  ? Smoking status: Never  ? Smokeless  tobacco: Never  ?Vaping Use  ? Vaping Use: Never used  ?Substance and Sexual Activity  ? Alcohol use: No  ? Drug use: No  ? Sexual activity: Not on file  ?Other Topics Concern  ? Not on file  ?Social History Narrative  ? Lives alone  ? Right Handed  ? Drinks very little caffeine  ? ?Social Determinants of Health  ? ?Financial Resource Strain: Not on file  ?Food Insecurity: Not on file  ?Transportation Needs: Not on file  ?Physical Activity: Not on file  ?Stress: Not on file  ?Social Connections: Not on file  ?Intimate Partner Violence: Not on file  ? ? ?Review of Systems  ?Respiratory:  Positive for shortness of breath.   ? ?Vitals:  ? 07/31/21 1016  ?BP: 120/78  ?Pulse: 66  ?Temp: 98 ?F (36.7 ?C)  ?SpO2: 96%  ? ? ? ?Physical Exam ?Vitals reviewed.  ?HENT:  ?   Head: Normocephalic.  ?   Nose: No congestion.  ?   Mouth/Throat:  ?   Mouth: Mucous membranes are moist.  ?Cardiovascular:  ?   Rate and Rhythm: Normal rate and regular rhythm.  ?   Heart sounds: No murmur heard. ?  No friction rub.  ?Pulmonary:  ?   Effort: No respiratory distress.  ?   Breath sounds: No stridor. No wheezing or rhonchi.  ?Musculoskeletal:  ?   Cervical back: No rigidity or tenderness.  ?Neurological:  ?   Mental Status: He is alert.  ?Psychiatric:     ?   Mood and Affect: Mood normal.  ? ?Data Reviewed: ?Recent chest x-ray 07/29/2021-reviewed with the patient-no significant abnormality ? ?Recent echocardiogram 07/21/2021 with normal ejection fraction ? ?Assessment:  ?Shortness of breath at rest ? ?Occasional anxiety ? ?No underlying significant lung disease, no significant occupational predisposition to lung disease ? ?Coronary artery disease-s/p stent placement ?-He does have microvascular disease on his recent cath performed 07/17/2021 ?-30% stenosis, 30% stenosis and 80% stenosis that was stented ? ?Plan/Recommendations: ?Obtain pulmonary function tests ? ?Graded exercises as tolerated ? ?Tentative follow-up in 6 to 8 weeks ? ?There may be a  component of physical deconditioning which graded exercises will help ? ?As he has no occupational predisposition, never smoker, recent cardiac intervention-optimizing activity level will be important ? ?No need for any inhalers or any other intervention at present ? ?Regarding anxiety, prescription for Xanax to be used as needed-he states he does not usually like using any medications ?-a medication to be used as needed may  be helpful in the situation ? ? ?Sherrilyn Rist MD ?[facility] ?07/31/2021, 10:42 AM ? ?CC: Kroeger, Lorelee Cover., PA-C ? ? ? ?

## 2021-07-31 NOTE — Patient Instructions (Signed)
Medication Instructions:  ?HOLD rosuvastatin (crestor) 2-3 weeks and call office or send a message via MyChart with an update on your symptoms  ? ?*If you need a refill on your cardiac medications before your next appointment, please call your pharmacy* ? ? ?Follow-Up: ?At [facility], you and your health needs are our priority.  As part of our continuing mission to provide you with exceptional heart care, we have created designated Provider Care Teams.  These Care Teams include your primary Cardiologist (physician) and Advanced Practice Providers (APPs -  Physician Assistants and Nurse Practitioners) who all work together to provide you with the care you need, when you need it. ? ?We recommend signing up for the patient portal called "MyChart".  Sign up information is provided on this After Visit Summary.  MyChart is used to connect with patients for Virtual Visits (Telemedicine).  Patients are able to view lab/test results, encounter notes, upcoming appointments, etc.  Non-urgent messages can be sent to your provider as well.   ?To learn more about what you can do with MyChart, go to [email or web address].   ? ?Your next appointment:   ?August 2023 with Dr. Debara Pickett  ? ?Your 5/23 appointment with Urban Gibson NP has been cancelled ?

## 2021-07-31 NOTE — Patient Instructions (Signed)
We will schedule you for breathing study ? ?Graded exercises, up to 30 minutes of exercise on a regular basis, at least 3 to 4 days a week ? ?Prescription for Xanax to be used only as needed ? ?Call us with significant concerns ? ?Follow-up in about 6 to 8 weeks ?

## 2021-08-02 ENCOUNTER — Other Ambulatory Visit (HOSPITAL_COMMUNITY): Payer: Self-pay

## 2021-08-02 ENCOUNTER — Telehealth: Payer: Self-pay | Admitting: Pulmonary Disease

## 2021-08-02 NOTE — Telephone Encounter (Signed)
ATC patient. Patient did not answer. Unable to LVM due to the patients mailbox being full. Will try to call him back.  ?

## 2021-08-02 NOTE — Telephone Encounter (Signed)
I see in the system that Epic did not send the medication. Can you please re-send the medication?  ?

## 2021-08-05 MED ORDER — ALPRAZOLAM 0.5 MG PO TABS
0.5000 mg | ORAL_TABLET | Freq: Two times a day (BID) | ORAL | 2 refills | Status: DC | PRN
Start: 2021-08-05 — End: 2022-03-29

## 2021-08-05 NOTE — Telephone Encounter (Signed)
Dr. Ander Slade please sign PRN Xanax order so pharmacy may send it.  ?

## 2021-08-05 NOTE — Telephone Encounter (Signed)
Estill Bamberg let the patient know and he is aware. Nothing further needed.  ?

## 2021-08-12 ENCOUNTER — Ambulatory Visit (INDEPENDENT_AMBULATORY_CARE_PROVIDER_SITE_OTHER): Payer: 59 | Admitting: Family Medicine

## 2021-08-12 VITALS — BP 116/78 | HR 64 | Temp 98.0°F | Resp 16 | Ht 65.0 in | Wt 163.6 lb

## 2021-08-12 DIAGNOSIS — R739 Hyperglycemia, unspecified: Secondary | ICD-10-CM | POA: Diagnosis not present

## 2021-08-12 DIAGNOSIS — R0609 Other forms of dyspnea: Secondary | ICD-10-CM

## 2021-08-12 DIAGNOSIS — Z955 Presence of coronary angioplasty implant and graft: Secondary | ICD-10-CM | POA: Diagnosis not present

## 2021-08-12 DIAGNOSIS — Z0000 Encounter for general adult medical examination without abnormal findings: Secondary | ICD-10-CM

## 2021-08-12 DIAGNOSIS — E785 Hyperlipidemia, unspecified: Secondary | ICD-10-CM | POA: Diagnosis not present

## 2021-08-12 DIAGNOSIS — F411 Generalized anxiety disorder: Secondary | ICD-10-CM

## 2021-08-12 DIAGNOSIS — I2510 Atherosclerotic heart disease of native coronary artery without angina pectoris: Secondary | ICD-10-CM

## 2021-08-12 DIAGNOSIS — Z125 Encounter for screening for malignant neoplasm of prostate: Secondary | ICD-10-CM | POA: Diagnosis not present

## 2021-08-12 LAB — LIPID PANEL
Cholesterol: 200 mg/dL (ref 0–200)
HDL: 37.4 mg/dL — ABNORMAL LOW (ref >39.00)
LDL Cholesterol: 135 mg/dL — ABNORMAL HIGH (ref 0–99)
NonHDL: 162.56
Total CHOL/HDL Ratio: 5
Triglycerides: 139 mg/dL (ref 0.0–149.0)
VLDL: 27.8 mg/dL (ref 0.0–40.0)

## 2021-08-12 LAB — COMPREHENSIVE METABOLIC PANEL
ALT: 48 U/L (ref 0–53)
AST: 32 U/L (ref 0–37)
Albumin: 4.4 g/dL (ref 3.5–5.2)
Alkaline Phosphatase: 60 U/L (ref 39–117)
BUN: 14 mg/dL (ref 6–23)
CO2: 29 mEq/L (ref 19–32)
Calcium: 9.6 mg/dL (ref 8.4–10.5)
Chloride: 105 mEq/L (ref 96–112)
Creatinine, Ser: 1.01 mg/dL (ref 0.40–1.50)
GFR: 81.52 mL/min (ref >60.00)
Glucose, Bld: 92 mg/dL (ref 70–99)
Potassium: 4.5 mEq/L (ref 3.5–5.1)
Sodium: 140 mEq/L (ref 135–145)
Total Bilirubin: 1.7 mg/dL — ABNORMAL HIGH (ref 0.2–1.2)
Total Protein: 6.8 g/dL (ref 6.0–8.3)

## 2021-08-12 LAB — PSA: PSA: 1 ng/mL (ref 0.10–4.00)

## 2021-08-12 LAB — HEMOGLOBIN A1C: Hgb A1c MFr Bld: 5.5 % (ref 4.6–6.5)

## 2021-08-12 MED ORDER — SERTRALINE HCL 25 MG PO TABS: 25.0000 mg | ORAL_TABLET | Freq: Every day | ORAL | 3 refills | Status: DC

## 2021-08-12 NOTE — Patient Instructions (Addendum)
Try famotidine over the counter to see if heartburn component. If soreness is better off the cholesterol med, can try restarting at 1/2 pill per day or 1 every other day.  If pain increases with restarting cholesterol med let myself or cardiology know.  Start sertraline once per day for anxiety, only use alprazolam if needed and see  info about managing anxiety. Recheck in 2 weeks.  Return to the clinic or go to the nearest emergency room if any of your symptoms worsen or new symptoms occur. Please schedule appointment with eye specialist.   Preventive Care 59-53 Years Old, Male Preventive care refers to lifestyle choices and visits with your health care provider that can promote health and wellness. Preventive care visits are also called wellness exams. What can I expect for my preventive care visit? Counseling During your preventive care visit, your health care provider may ask about your: Medical history, including: Past medical problems. Family medical history. Current health, including: Emotional well-being. Home life and relationship well-being. Sexual activity. Lifestyle, including: Alcohol, nicotine or tobacco, and drug use. Access to firearms. Diet, exercise, and sleep habits. Safety issues such as seatbelt and bike helmet use. Sunscreen use. Work and work Statistician. Physical exam Your health care provider will check your: Height and weight. These may be used to calculate your BMI (body mass index). BMI is a measurement that tells if you are at a healthy weight. Waist circumference. This measures the distance around your waistline. This measurement also tells if you are at a healthy weight and may help predict your risk of certain diseases, such as type 2 diabetes and high blood pressure. Heart rate and blood pressure. Body temperature. Skin for abnormal spots. What immunizations do I need?  Vaccines are usually given at various ages, according to a schedule. Your health  care provider will recommend vaccines for you based on your age, medical history, and lifestyle or other factors, such as travel or where you work. What tests do I need? Screening Your health care provider may recommend screening tests for certain conditions. This may include: Lipid and cholesterol levels. Diabetes screening. This is done by checking your blood sugar (glucose) after you have not eaten for a while (fasting). Hepatitis B test. Hepatitis C test. HIV (human immunodeficiency virus) test. STI (sexually transmitted infection) testing, if you are at risk. Lung cancer screening. Prostate cancer screening. Colorectal cancer screening. Talk with your health care provider about your test results, treatment options, and if necessary, the need for more tests. Follow these instructions at home: Eating and drinking  Eat a diet that includes fresh fruits and vegetables, whole grains, lean protein, and low-fat dairy products. Take vitamin and mineral supplements as recommended by your health care provider. Do not drink alcohol if your health care provider tells you not to drink. If you drink alcohol: Limit how much you have to 0-2 drinks a day. Know how much alcohol is in your drink. In the [place] one drink equals one 12 oz bottle of beer (355 mL), one 5 oz glass of wine (148 mL), or one 1 oz glass of hard liquor (44 mL). Lifestyle Brush your teeth every morning and night with fluoride toothpaste. Floss one time each day. Exercise for at least 30 minutes 5 or more days each week. Do not use any products that contain nicotine or tobacco. These products include cigarettes, chewing tobacco, and vaping devices, such as e-cigarettes. If you need help quitting, ask your health care provider. Do not use  drugs. If you are sexually active, practice safe sex. Use a condom or other form of protection to prevent STIs. Take aspirin only as told by your health care provider. Make sure that you  understand how much to take and what form to take. Work with your health care provider to find out whether it is safe and beneficial for you to take aspirin daily. Find healthy ways to manage stress, such as: Meditation, yoga, or listening to music. Journaling. Talking to a trusted person. Spending time with friends and family. Minimize exposure to UV radiation to reduce your risk of skin cancer. Safety Always wear your seat belt while driving or riding in a vehicle. Do not drive: If you have been drinking alcohol. Do not ride with someone who has been drinking. When you are tired or distracted. While texting. If you have been using any mind-altering substances or drugs. Wear a helmet and other protective equipment during sports activities. If you have firearms in your house, make sure you follow all gun safety procedures. What's next? Go to your health care provider once a year for an annual wellness visit. Ask your health care provider how often you should have your eyes and teeth checked. Stay up to date on all vaccines. This information is not intended to replace advice given to you by your health care provider. Make sure you discuss any questions you have with your health care provider. Document Revised: 09/05/2020 Document Reviewed: 09/05/2020 Elsevier Patient Education  [place], Adult After being diagnosed with anxiety, you may be relieved to know why you have felt or behaved a certain way. You may also feel overwhelmed about the treatment ahead and what it will mean for your life. With care and support, you can manage this condition. How to manage lifestyle changes Managing stress and anxiety  Stress is your body's reaction to life changes and events, both good and bad. Most stress will last just a few hours, but stress can be ongoing and can lead to more than just stress. Although stress can play a major role in anxiety, it is not the same as  anxiety. Stress is usually caused by something external, such as a deadline, test, or competition. Stress normally passes after the triggering event has ended.  Anxiety is caused by something internal, such as imagining a terrible outcome or worrying that something will go wrong that will devastate you. Anxiety often does not go away even after the triggering event is over, and it can become long-term (chronic) worry. It is important to understand the differences between stress and anxiety and to manage your stress effectively so that it does not lead to an anxious response. Talk with your health care provider or a counselor to learn more about reducing anxiety and stress. He or she may suggest tension reduction techniques, such as: Music therapy. Spend time creating or listening to music that you enjoy and that inspires you. Mindfulness-based meditation. Practice being aware of your normal breaths while not trying to control your breathing. It can be done while sitting or walking. Centering prayer. This involves focusing on a word, phrase, or sacred image that means something to you and brings you peace. Deep breathing. To do this, expand your stomach and inhale slowly through your nose. Hold your breath for 3-5 seconds. Then exhale slowly, letting your stomach muscles relax. Self-talk. Learn to notice and identify thought patterns that lead to anxiety reactions and change those patterns to thoughts that  feel peaceful. Muscle relaxation. Taking time to tense muscles and then relax them. Choose a tension reduction technique that fits your lifestyle and personality. These techniques take time and practice. Set aside 5-15 minutes a day to do them. Therapists can offer counseling and training in these techniques. The training to help with anxiety may be covered by some insurance plans. Other things you can do to manage stress and anxiety include: Keeping a stress diary. This can help you learn what triggers  your reaction and then learn ways to manage your response. Thinking about how you react to certain situations. You may not be able to control everything, but you can control your response. Making time for activities that help you relax and not feeling guilty about spending your time in this way. Doing visual imagery. This involves imagining or creating mental pictures to help you relax. Practicing yoga. Through yoga poses, you can lower tension and promote relaxation.  Medicines Medicines can help ease symptoms. Medicines for anxiety include: Antidepressant medicines. These are usually prescribed for long-term daily control. Anti-anxiety medicines. These may be added in severe cases, especially when panic attacks occur. Medicines will be prescribed by a health care provider. When used together, medicines, psychotherapy, and tension reduction techniques may be the most effective treatment. Relationships Relationships can play a big part in helping you recover. Try to spend more time connecting with trusted friends and family members. Consider going to couples counseling if you have a partner, taking family education classes, or going to family therapy. Therapy can help you and others better understand your condition. How to recognize changes in your anxiety Everyone responds differently to treatment for anxiety. Recovery from anxiety happens when symptoms decrease and stop interfering with your daily activities at home or work. This may mean that you will start to: Have better concentration and focus. Worry will interfere less in your daily thinking. Sleep better. Be less irritable. Have more energy. Have improved memory. It is also important to recognize when your condition is getting worse. Contact your health care provider if your symptoms interfere with home or work and you feel like your condition is not improving. Follow these instructions at home: Activity Exercise. Adults should do  the following: Exercise for at least 150 minutes each week. The exercise should increase your heart rate and make you sweat (moderate-intensity exercise). Strengthening exercises at least twice a week. Get the right amount and quality of sleep. Most adults need 7-9 hours of sleep each night. Lifestyle  Eat a healthy diet that includes plenty of vegetables, fruits, whole grains, low-fat dairy products, and lean protein. Do not eat a lot of foods that are high in fats, added sugars, or salt (sodium). Make choices that simplify your life. Do not use any products that contain nicotine or tobacco. These products include cigarettes, chewing tobacco, and vaping devices, such as e-cigarettes. If you need help quitting, ask your health care provider. Avoid caffeine, alcohol, and certain over-the-counter cold medicines. These may make you feel worse. Ask your pharmacist which medicines to avoid. General instructions Take over-the-counter and prescription medicines only as told by your health care provider. Keep all follow-up visits. This is important. Where to find support You can get help and support from these sources: Self-help groups. Online and [employer]. A trusted spiritual leader. Couples counseling. Family education classes. Family therapy. Where to find more information You may find that joining a support group helps you deal with your anxiety. The following sources can help  you locate counselors or support groups near you: [place]: www.mentalhealthamerica.net Anxiety and Depression Association of [place] (ADAA): https://www.clark.net/ National Alliance on Mental Illness (NAMI): www.nami.org Contact a health care provider if: You have a hard time staying focused or finishing daily tasks. You spend many hours a day feeling worried about everyday life. You become exhausted by worry. You start to have headaches or frequently feel tense. You develop chronic nausea or  diarrhea. Get help right away if: You have a racing heart and shortness of breath. You have thoughts of hurting yourself or others. If you ever feel like you may hurt yourself or others, or have thoughts about taking your own life, get help right away. Go to your nearest emergency department or: Call your local emergency services (911 in the [place] Call a suicide crisis helpline, such as the [place] at (574)236-4967 or 988 in the [place]. This is open 24 hours a day in the [place] Text the Crisis Text Line at (817)630-3772 (in the [place].). Summary Taking steps to learn and use tension reduction techniques can help calm you and help prevent triggering an anxiety reaction. When used together, medicines, psychotherapy, and tension reduction techniques may be the most effective treatment. Family, friends, and partners can play a big part in supporting you. This information is not intended to replace advice given to you by your health care provider. Make sure you discuss any questions you have with your health care provider. Document Revised: 10/03/2020 Document Reviewed: 07/01/2020 Elsevier Patient Education  [place].

## 2021-08-12 NOTE — Progress Notes (Unsigned)
Subjective:  Patient ID: Todd [name], male    DOB: [birth year]  Age: 61 y.o. MRN: [id number]  CC:  Chief Complaint  Patient presents with   Annual Exam    Pt here for annual no concerns, is seeing cardiology per referral on 07/29/21     HPI Todd [name] presents for Annual Exam And  follow-up of recent health changes.  Coronary artery disease With previous unstable angina.  Records reviewed.  Hospitalized April 26 through 27, PTCA/DES x1 to the mid LAD with severe mid LAD stenosis.  Dual antiplatelet therapy with aspirin/Plavix for at least 6 months and continued on beta-blocker with increase of Crestor to 20 mg daily.  Admitted April 29 through 30 after ER visit for chest pain with dyspnea.  Troponin 504, 404.  No evidence of stent failure. Persistent dyspnea past few months, referred to pulmonary as not significantly improved after PCI.  Reassuring echo 07/21/2021 with EF 65 to 70%.  No regional wall motion abnormalities.  Right ventricular systolic function normal, mild aortic regurgitation, normal mitral valve.  Had been seen in ER for bleeding hemorrhoids, chest pain on May 2, 3, 8.   Cardiology follow-up appointment noted from May 10.  Chest pressure resolved after DES.  Ruled out for MI without EKG changes on the numerous ER eval's since DES.  Hemorrhoidal bleeding was improved.  Continued on aspirin and Plavix for minimum of 6 months.  Chest symptoms possibly chest wall pain, and did not appear to be a complication or related to his cardiac cath without ST elevation.  Also possible statin side effect so 2-week statin holiday discussed.  Over-the-counter famotidine for possible reflux.  Follow-up in August. stopped crestor - better initially, but not completely gone. Made a little difference. Not every night, not as bad. Notes at night for about 45 min. No pain with eating. Notes hours after eating. Stabbing feeling or sore chest muscle.  Has not tried famotidine.  Hemorrhoids better with  change in diet.   Dyspnea See prior notes, discussed in the hospital as well as subsequent ER visits and cardiology eval. Pulmonary eval noted from May 10.  Dr. Ander Slade.  No underlying significant lung disease or significant occupational pre-existing delay disease.  Plan for pulmonary function test, graded exercise as tolerated and tentative follow-up in 6 to 8 weeks.  Possible component of physical deconditioning. Plan for PFT in next few weeks.  Breathing better with walking.  Anxiety Short-term Xanax prescription given from pulmonary at most recent visit.  No daily meds. Has not taken xanax.  Feeling anxious at times, does feel anxiety past year.     08/12/2021   10:18 AM  GAD 7 : Generalized Anxiety Score  Nervous, Anxious, on Edge 1  Control/stop worrying 0  Worry too much - different things 3  Trouble relaxing 0  Restless 0  Easily annoyed or irritable 1  Afraid - awful might happen 2  Total GAD 7 Score 7       07/15/2021   10:54 AM 05/13/2021    8:43 AM 10/22/2020   12:34 PM 06/05/2020    1:58 PM 04/11/2020    2:03 PM  Depression screen PHQ 2/9  Decreased Interest 0 0 0 0 0  Down, Depressed, Hopeless 0 0 0 0 0  PHQ - 2 Score 0 0 0 0 0  Altered sleeping 0      Tired, decreased energy 0      Change in appetite 0  Feeling bad or failure about yourself  0      Trouble concentrating 0      Moving slowly or fidgety/restless 0      Suicidal thoughts 0      PHQ-9 Score 0      Difficult doing work/chores Not difficult at all        Health Maintenance  Topic Date Due   COVID-19 Vaccine (3 - Booster for Pfizer series) 08/28/2021 (Originally 10/22/2019)   Zoster Vaccines- Shingrix (1 of 2) 10/14/2021 (Originally 04/24/2012)   TETANUS/TDAP  10/22/2021 (Originally 04/24/1981)   Hepatitis C Screening  10/22/2021 (Originally 04/24/1980)   INFLUENZA VACCINE  10/22/2021   COLONOSCOPY (Pts 45-45yr Insurance coverage will need to be confirmed)  07/03/2029   HIV Screening  Completed    HPV VACCINES  Aged Out  Colonoscopy 06/2019. Repeat 10 yrs.  Prostate: does NOT have family history of prostate cancer The natural history of prostate cancer and ongoing controversy regarding screening and potential treatment outcomes of prostate cancer has been discussed with the patient. The meaning of a false positive PSA and a false negative PSA has been discussed. He indicates understanding of the limitations of this screening test and wishes  to proceed with screening PSA testing. Lab Results  Component Value Date   PSA1 1.2 09/19/2019   PSA 1.00 06/17/2014    Immunization History  Administered Date(s) Administered   PFIZER Comirnaty(Gray Top)Covid-19 Tri-Sucrose Vaccine 08/06/2019, 08/27/2019  Shingrix: declines Covid booster: declines.   No results found. Optho: none. Wears glasses. Last visit 1 year.  [place].   Alcohol: none  Tobacco: none  Exercise:walking 30-457m per day. Plans to get back to gym.    History Patient Active Problem List   Diagnosis Date Noted   [place]I (non-ST elevated myocardial infarction) [place]   CAD (coronary artery disease) 07/18/2021   Hyperlipidemia 07/18/2021   Unstable angina (HCC)    Shortness of breath 11/12/2016   Chest pain 11/12/2016   Heart palpitations 10/20/2013   Situational anxiety 10/20/2013   Migraine with visual aura 08/24/2013   TIA (transient ischemic attack) 08/23/2013   Blurred vision 08/23/2013   Anomia 08/23/2013   Past Medical History:  Diagnosis Date   Complicated migraine    Diverticulosis    External hemorrhoids    External hemorrhoids    Headache    Hx of cardiovascular stress test    Myoview 8/10:  no ischemia, EF 62%; low risk   Hx of Doppler ultrasound    Carotid [place]:  bilat ICA 1-39%   Hx of echocardiogram    echo 6/15:  EF 60-65%, no RWMA   Hypertension    Internal hemorrhoids    Palpitations    Stroke ([facility]   TIA (transient ischemic attack)     Tubular adenoma of colon    Vision abnormalities    Past Surgical History:  Procedure Laterality Date   APPENDECTOMY  12/01/11   lap appy   CORONARY STENT INTERVENTION N/A 07/17/2021   Procedure: CORONARY STENT INTERVENTION;  Surgeon: McBurnell BlanksMD;  [facility];  Service: Cardiovascular;  Laterality: N/A;   LAPAROSCOPIC APPENDECTOMY  12/02/2011   Procedure: APPENDECTOMY LAPAROSCOPIC;  Surgeon: BrMadilyn HookDO;  Location: [facility];  Service: General;  Laterality: N/A;   LEFT HEART CATH AND CORONARY ANGIOGRAPHY N/A 07/17/2021   Procedure: LEFT HEART CATH AND CORONARY ANGIOGRAPHY;  Surgeon: McBurnell BlanksMD;  [facility];  Service: Cardiovascular;  Laterality: N/A;   Allergies  Allergen Reactions   Wellbutrin [Bupropion Hcl] Itching and Rash   Prior to Admission medications   Medication Sig Start Date End Date Taking? Authorizing Provider  ALPRAZolam Duanne Moron) 0.5 MG tablet Take 1 tablet (0.5 mg total) by mouth 2 (two) times daily as needed for anxiety. 08/05/21  Yes Olalere, Adewale A, MD  aspirin EC 81 MG tablet Take 81 mg by mouth daily. Swallow whole.   Yes [provider]  metoprolol succinate (TOPROL XL) 25 MG 24 hr tablet Take 1 tablet (25 mg total) by mouth daily. 07/10/21  Yes Loel Dubonnet, NP  clopidogrel (PLAVIX) 75 MG tablet Take 1 tablet (75 mg total) by mouth daily with breakfast. Patient not taking: Reported on 08/12/2021 07/18/21   Darreld Mclean, PA-C  hydrocortisone (ANUSOL-HC) 2.5 % rectal cream Place 1 application. rectally 2 (two) times daily. Patient not taking: Reported on 07/31/2021 07/24/21   Tonye Pearson, PA-C  nitroGLYCERIN (NITROSTAT) 0.4 MG SL tablet Place 1 tablet (0.4 mg total) under the tongue every 5 (five) minutes as needed for chest pain. Patient not taking: Reported on 07/31/2021 07/12/21 10/10/21  Loel Dubonnet, NP  polyethylene glycol (MIRALAX) 17 g packet Take 17 g by mouth 2 (two)  times daily. Patient not taking: Reported on 07/31/2021 07/23/21   Evlyn Courier, PA-C  rosuvastatin (CRESTOR) 20 MG tablet Take 1 tablet (20 mg total) by mouth daily. Patient not taking: Reported on 08/12/2021 07/18/21   Darreld Mclean, PA-C  senna (SENOKOT) 8.6 MG TABS tablet Take 1 tablet (8.6 mg total) by mouth in the morning and at bedtime. Patient not taking: Reported on 07/31/2021 07/23/21   Evlyn Courier, PA-C   Social History   Socioeconomic History   Marital status: Divorced    Spouse name: Not on file   Number of children: Not on file   Years of education: Not on file   Highest education level: Not on file  Occupational History   Occupation: part time  Tobacco Use   Smoking status: Never   Smokeless tobacco: Never  Vaping Use   Vaping Use: Never used  Substance and Sexual Activity   Alcohol use: No   Drug use: No   Sexual activity: Not on file  Other Topics Concern   Not on file  Social History Narrative   Lives alone   Right Handed   Drinks very little caffeine   Social Determinants of Health   Financial Resource Strain: Not on file  Food Insecurity: Not on file  Transportation Needs: Not on file  Physical Activity: Not on file  Stress: Not on file  Social Connections: Not on file  Intimate Partner Violence: Not on file    Review of Systems 13 point review of systems per patient health survey noted.  Negative other than as indicated above or in HPI.    Objective:   Vitals:   08/12/21 0931  BP: 116/78  Pulse: 64  Resp: 16  Temp: 98 F (36.7 C)  TempSrc: Oral  SpO2: 98%  Weight: 163 lb 9.6 oz (74.2 kg)  Height: '5\' 5"'$  (1.651 m)   {Vitals History (Optional):23777}  Physical Exam Vitals reviewed.  Constitutional:      General: He is not in acute distress.    Appearance: He is well-developed. He is not ill-appearing or diaphoretic.  HENT:     Head: Normocephalic and atraumatic.     Right Ear: External ear  normal.     Left Ear: External ear normal.   Eyes:     Conjunctiva/sclera: Conjunctivae normal.     Pupils: Pupils are equal, round, and reactive to light.  Neck:     Thyroid: No thyromegaly.  Cardiovascular:     Rate and Rhythm: Normal rate and regular rhythm.     Heart sounds: Normal heart sounds.     Comments: No current chest pain, chest wall nontender Pulmonary:     Effort: Pulmonary effort is normal. No respiratory distress.     Breath sounds: Normal breath sounds. No wheezing.  Abdominal:     General: There is no distension.     Palpations: Abdomen is soft.     Tenderness: There is no abdominal tenderness. There is no guarding.  Musculoskeletal:        General: No tenderness. Normal range of motion.     Cervical back: Normal range of motion and neck supple.  Lymphadenopathy:     Cervical: No cervical adenopathy.  Skin:    General: Skin is warm and dry.  Neurological:     Mental Status: He is alert and oriented to person, place, and time.     Deep Tendon Reflexes: Reflexes are normal and symmetric.  Psychiatric:        Behavior: Behavior normal.     Comments: Slightly anxious appearing, good eye contact with appropriate responses, does not appear to be responding to internal stimuli.       Assessment & Plan:  Todd [name] is a 61 y.o. male . Annual physical exam  Hyperlipidemia, unspecified hyperlipidemia type  Hyperglycemia  Screening for prostate cancer  S/P drug eluting coronary stent placement  Coronary artery disease involving native heart without angina pectoris, unspecified vessel or lesion type  DOE (dyspnea on exertion)   No orders of the defined types were placed in this encounter.  There are no Patient Instructions on file for this visit.    Signed,   Merri Ray, MD [facility] 08/12/21 9:49 AM

## 2021-08-13 ENCOUNTER — Ambulatory Visit (HOSPITAL_BASED_OUTPATIENT_CLINIC_OR_DEPARTMENT_OTHER): Payer: 59 | Admitting: Family

## 2021-08-15 ENCOUNTER — Encounter (HOSPITAL_COMMUNITY): Payer: Self-pay

## 2021-08-26 ENCOUNTER — Ambulatory Visit: Payer: 59 | Admitting: Family Medicine

## 2021-08-26 ENCOUNTER — Encounter: Payer: Self-pay | Admitting: Family Medicine

## 2021-08-26 VITALS — BP 130/74 | HR 65 | Temp 97.9°F | Resp 15 | Ht 65.0 in | Wt 164.8 lb

## 2021-08-26 DIAGNOSIS — R12 Heartburn: Secondary | ICD-10-CM | POA: Diagnosis not present

## 2021-08-26 DIAGNOSIS — K642 Third degree hemorrhoids: Secondary | ICD-10-CM | POA: Insufficient documentation

## 2021-08-26 DIAGNOSIS — E785 Hyperlipidemia, unspecified: Secondary | ICD-10-CM

## 2021-08-26 DIAGNOSIS — Z955 Presence of coronary angioplasty implant and graft: Secondary | ICD-10-CM | POA: Insufficient documentation

## 2021-08-26 DIAGNOSIS — Z7901 Long term (current) use of anticoagulants: Secondary | ICD-10-CM | POA: Insufficient documentation

## 2021-08-26 DIAGNOSIS — R0789 Other chest pain: Secondary | ICD-10-CM

## 2021-08-26 DIAGNOSIS — F411 Generalized anxiety disorder: Secondary | ICD-10-CM

## 2021-08-26 DIAGNOSIS — K648 Other hemorrhoids: Secondary | ICD-10-CM | POA: Insufficient documentation

## 2021-08-26 NOTE — Patient Instructions (Addendum)
I am glad to hear that you are doing better.  Ok to use pepcid or zantac over the counter if needed for heartburn.  Return to the clinic or go to the nearest emergency room if any of your symptoms worsen or new symptoms occur.  If anxiety increases, I recommend taking sertraline daily - it can take up to 6 weeks of use to see how it works.   Try taking rosuvastatin daily. Let me know if any new side effects but I think you will do well. Recheck 1 month.   Managing Anxiety, Adult After being diagnosed with anxiety, you may be relieved to know why you have felt or behaved a certain way. You may also feel overwhelmed about the treatment ahead and what it will mean for your life. With care and support, you can manage this condition. How to manage lifestyle changes Managing stress and anxiety  Stress is your body's reaction to life changes and events, both good and bad. Most stress will last just a few hours, but stress can be ongoing and can lead to more than just stress. Although stress can play a major role in anxiety, it is not the same as anxiety. Stress is usually caused by something external, such as a deadline, test, or competition. Stress normally passes after the triggering event has ended.  Anxiety is caused by something internal, such as imagining a terrible outcome or worrying that something will go wrong that will devastate you. Anxiety often does not go away even after the triggering event is over, and it can become long-term (chronic) worry. It is important to understand the differences between stress and anxiety and to manage your stress effectively so that it does not lead to an anxious response. Talk with your health care provider or a counselor to learn more about reducing anxiety and stress. He or she may suggest tension reduction techniques, such as: Music therapy. Spend time creating or listening to music that you enjoy and that inspires you. Mindfulness-based meditation. Practice  being aware of your normal breaths while not trying to control your breathing. It can be done while sitting or walking. Centering prayer. This involves focusing on a word, phrase, or sacred image that means something to you and brings you peace. Deep breathing. To do this, expand your stomach and inhale slowly through your nose. Hold your breath for 3-5 seconds. Then exhale slowly, letting your stomach muscles relax. Self-talk. Learn to notice and identify thought patterns that lead to anxiety reactions and change those patterns to thoughts that feel peaceful. Muscle relaxation. Taking time to tense muscles and then relax them. Choose a tension reduction technique that fits your lifestyle and personality. These techniques take time and practice. Set aside 5-15 minutes a day to do them. Therapists can offer counseling and training in these techniques. The training to help with anxiety may be covered by some insurance plans. Other things you can do to manage stress and anxiety include: Keeping a stress diary. This can help you learn what triggers your reaction and then learn ways to manage your response. Thinking about how you react to certain situations. You may not be able to control everything, but you can control your response. Making time for activities that help you relax and not feeling guilty about spending your time in this way. Doing visual imagery. This involves imagining or creating mental pictures to help you relax. Practicing yoga. Through yoga poses, you can lower tension and promote relaxation.  Medicines Medicines  can help ease symptoms. Medicines for anxiety include: Antidepressant medicines. These are usually prescribed for long-term daily control. Anti-anxiety medicines. These may be added in severe cases, especially when panic attacks occur. Medicines will be prescribed by a health care provider. When used together, medicines, psychotherapy, and tension reduction techniques may be  the most effective treatment. Relationships Relationships can play a big part in helping you recover. Try to spend more time connecting with trusted friends and family members. Consider going to couples counseling if you have a partner, taking family education classes, or going to family therapy. Therapy can help you and others better understand your condition. How to recognize changes in your anxiety Everyone responds differently to treatment for anxiety. Recovery from anxiety happens when symptoms decrease and stop interfering with your daily activities at home or work. This may mean that you will start to: Have better concentration and focus. Worry will interfere less in your daily thinking. Sleep better. Be less irritable. Have more energy. Have improved memory. It is also important to recognize when your condition is getting worse. Contact your health care provider if your symptoms interfere with home or work and you feel like your condition is not improving. Follow these instructions at home: Activity Exercise. Adults should do the following: Exercise for at least 150 minutes each week. The exercise should increase your heart rate and make you sweat (moderate-intensity exercise). Strengthening exercises at least twice a week. Get the right amount and quality of sleep. Most adults need 7-9 hours of sleep each night. Lifestyle  Eat a healthy diet that includes plenty of vegetables, fruits, whole grains, low-fat dairy products, and lean protein. Do not eat a lot of foods that are high in fats, added sugars, or salt (sodium). Make choices that simplify your life. Do not use any products that contain nicotine or tobacco. These products include cigarettes, chewing tobacco, and vaping devices, such as e-cigarettes. If you need help quitting, ask your health care provider. Avoid caffeine, alcohol, and certain over-the-counter cold medicines. These may make you feel worse. Ask your pharmacist  which medicines to avoid. General instructions Take over-the-counter and prescription medicines only as told by your health care provider. Keep all follow-up visits. This is important. Where to find support You can get help and support from these sources: Self-help groups. Online and [employer]. A trusted spiritual leader. Couples counseling. Family education classes. Family therapy. Where to find more information You may find that joining a support group helps you deal with your anxiety. The following sources can help you locate counselors or support groups near you: [employer]: www.mentalhealthamerica.net Anxiety and Depression Association of [place] (ADAA): https://www.clark.net/ National Alliance on Mental Illness (NAMI): www.nami.org Contact a health care provider if: You have a hard time staying focused or finishing daily tasks. You spend many hours a day feeling worried about everyday life. You become exhausted by worry. You start to have headaches or frequently feel tense. You develop chronic nausea or diarrhea. Get help right away if: You have a racing heart and shortness of breath. You have thoughts of hurting yourself or others. If you ever feel like you may hurt yourself or others, or have thoughts about taking your own life, get help right away. Go to your nearest emergency department or: Call your local emergency services (911 in the [place] Call a suicide crisis helpline, such as the [place] at (724)050-3214 or 988 in the [place]. This is open 24 hours a day in  the [place] Text the Crisis Text Line at (614)540-4519 (in the [place].). Summary Taking steps to learn and use tension reduction techniques can help calm you and help prevent triggering an anxiety reaction. When used together, medicines, psychotherapy, and tension reduction techniques may be the most effective treatment. Family, friends, and partners can play a big part in supporting  you. This information is not intended to replace advice given to you by your health care provider. Make sure you discuss any questions you have with your health care provider. Document Revised: 10/03/2020 Document Reviewed: 07/01/2020 Elsevier Patient Education  [place].

## 2021-08-26 NOTE — Progress Notes (Signed)
Subjective:  Patient ID: Todd [name], male    DOB: [birth year]  Age: 61 y.o. MRN: [id number]  CC:  Chief Complaint  Patient presents with   chest wall pain    Pt reports chest wall pain has resolved at this time   Anxiety    Doing well no concerns from pt     HPI Todd [name] presents for   Anxiety: See last visit May 22, short-term Xanax provided by pulmonary.  Did report some anxiety over the previous year with intermittent spells of anxiety.  Started on sertraline 25 mg daily. Not taking daily - only took 6 times. No side effects.  Not taking alprazolam. Feeling back to his normal self.   Chest wall pain Follow-up May 22 visit.  Minimal change in symptoms with stopping statin, recommended restarting Crestor given underlying CAD.  Restarted at lower dose.  Possible component of reflux, was started on H2 blocker. Has taken otc H2 blocker few times. No further chest pain/chest wall pain.  Rare heartburn only  Taking statin every other day. Crestor '20mg'$ . No new side effects.   History Patient Active Problem List   Diagnosis Date Noted   NSTEMI (non-ST elevated myocardial infarction) ([place]) 07/20/2021   CAD (coronary artery disease) 07/18/2021   Hyperlipidemia 07/18/2021   Unstable angina ([facility])    Shortness of breath 11/12/2016   Chest pain 11/12/2016   Heart palpitations 10/20/2013   Situational anxiety 10/20/2013   Migraine with visual aura 08/24/2013   TIA (transient ischemic attack) 08/23/2013   Blurred vision 08/23/2013   Anomia 08/23/2013   Past Medical History:  Diagnosis Date   Complicated migraine    Diverticulosis    External hemorrhoids    External hemorrhoids    Headache    Hx of cardiovascular stress test    Myoview 8/10:  no ischemia, EF 62%; low risk   Hx of Doppler ultrasound    Carotid US 6/15:  bilat ICA 1-39%   Hx of echocardiogram    echo 6/15:  EF 60-65%, no RWMA   Hypertension    Internal hemorrhoids    Palpitations    Stroke ([place])    TIA  (transient ischemic attack)    Tubular adenoma of colon    Vision abnormalities    Past Surgical History:  Procedure Laterality Date   APPENDECTOMY  12/01/11   lap appy   CORONARY STENT INTERVENTION N/A 07/17/2021   Procedure: CORONARY STENT INTERVENTION;  Surgeon: Burnell Blanks, MD;  Location: [facility];  Service: Cardiovascular;  Laterality: N/A;   LAPAROSCOPIC APPENDECTOMY  12/02/2011   Procedure: APPENDECTOMY LAPAROSCOPIC;  Surgeon: Madilyn Hook, DO;  Location: [facility];  Service: General;  Laterality: N/A;   LEFT HEART CATH AND CORONARY ANGIOGRAPHY N/A 07/17/2021   Procedure: LEFT HEART CATH AND CORONARY ANGIOGRAPHY;  Surgeon: Burnell Blanks, MD;  Location: [facility];  Service: Cardiovascular;  Laterality: N/A;   Allergies  Allergen Reactions   Wellbutrin [Bupropion Hcl] Itching and Rash   Prior to Admission medications   Medication Sig Start Date End Date Taking? Authorizing Provider  ALPRAZolam Duanne Moron) 0.5 MG tablet Take 1 tablet (0.5 mg total) by mouth 2 (two) times daily as needed for anxiety. 08/05/21  Yes Olalere, Adewale A, MD  aspirin EC 81 MG tablet Take 81 mg by mouth daily. Swallow whole.   Yes [provider]  metoprolol succinate (TOPROL XL) 25 MG 24 hr tablet Take 1 tablet (25 mg total) by mouth  daily. 07/10/21  Yes Loel Dubonnet, NP  rosuvastatin (CRESTOR) 20 MG tablet Take 1 tablet (20 mg total) by mouth daily. 07/18/21  Yes Sande Rives E, PA-C  sertraline (ZOLOFT) 25 MG tablet Take 1 tablet (25 mg total) by mouth daily. 08/12/21  Yes Wendie Agreste, MD  clopidogrel (PLAVIX) 75 MG tablet Take 1 tablet (75 mg total) by mouth daily with breakfast. Patient not taking: Reported on 08/12/2021 07/18/21   Darreld Mclean, PA-C  hydrocortisone (ANUSOL-HC) 2.5 % rectal cream Place 1 application. rectally 2 (two) times daily. Patient not taking: Reported on 07/31/2021 07/24/21   Tonye Pearson, PA-C  nitroGLYCERIN (NITROSTAT) 0.4  MG SL tablet Place 1 tablet (0.4 mg total) under the tongue every 5 (five) minutes as needed for chest pain. Patient not taking: Reported on 07/31/2021 07/12/21 10/10/21  Loel Dubonnet, NP  polyethylene glycol (MIRALAX) 17 g packet Take 17 g by mouth 2 (two) times daily. Patient not taking: Reported on 07/31/2021 07/23/21   Evlyn Courier, PA-C  senna (SENOKOT) 8.6 MG TABS tablet Take 1 tablet (8.6 mg total) by mouth in the morning and at bedtime. Patient not taking: Reported on 07/31/2021 07/23/21   Evlyn Courier, PA-C   Social History   Socioeconomic History   Marital status: Divorced    Spouse name: Not on file   Number of children: Not on file   Years of education: Not on file   Highest education level: Not on file  Occupational History   Occupation: part time  Tobacco Use   Smoking status: Never   Smokeless tobacco: Never  Vaping Use   Vaping Use: Never used  Substance and Sexual Activity   Alcohol use: No   Drug use: No   Sexual activity: Not on file  Other Topics Concern   Not on file  Social History Narrative   Lives alone   Right Handed   Drinks very little caffeine   Social Determinants of Health   Financial Resource Strain: Not on file  Food Insecurity: Not on file  Transportation Needs: Not on file  Physical Activity: Not on file  Stress: Not on file  Social Connections: Not on file  Intimate Partner Violence: Not on file    Review of Systems Per hpi   Objective:   Vitals:   08/26/21 1313  BP: 130/74  Pulse: 65  Resp: 15  Temp: 97.9 F (36.6 C)  TempSrc: Temporal  SpO2: 99%  Weight: 164 lb 12.8 oz (74.8 kg)  Height: '5\' 5"'$  (1.651 m)     Physical Exam Vitals reviewed.  Constitutional:      Appearance: He is well-developed.  HENT:     Head: Normocephalic and atraumatic.  Neck:     Vascular: No carotid bruit or JVD.  Cardiovascular:     Rate and Rhythm: Normal rate and regular rhythm.     Heart sounds: Normal heart sounds. No murmur  heard. Pulmonary:     Effort: Pulmonary effort is normal.     Breath sounds: Normal breath sounds. No rales.     Comments: Chest wall nontender.  Musculoskeletal:     Right lower leg: No edema.     Left lower leg: No edema.  Skin:    General: Skin is warm and dry.  Neurological:     Mental Status: He is alert and oriented to person, place, and time.  Psychiatric:        Mood and Affect: Mood normal.  Assessment & Plan:  Todd [name] is a 61 y.o. male . Generalized anxiety disorder  -Improved.  Handout given on managing anxiety as he would like to avoid meds.  Discussed potential benefit with daily SSRI but if doing well currently can make decision on whether or not he continues on that med for continue to monitor symptoms.  Recheck 1 month.  Asked about exercise, recommended he discuss with cardiology on allowed exercise at this time.  This will likely help with anxiety management as well .  Chest wall pain  -Resolved.  RTC precautions.   Heartburn  -Improved, option of daily H2 blocker, trigger avoidance.  Hyperlipidemia, unspecified hyperlipidemia type  -Tolerating intermittent restart of statin, and likely will do fine with daily dosing of 20 mg.  Try increase with RTC precautions, 1 month follow-up for fasting labs.  Recheck anxiety at that time as well.  No orders of the defined types were placed in this encounter.  Patient Instructions  I am glad to hear that you are doing better.  Ok to use pepcid or zantac over the counter if needed for heartburn.  Return to the clinic or go to the nearest emergency room if any of your symptoms worsen or new symptoms occur.  If anxiety increases, I recommend taking sertraline daily - it can take up to 6 weeks of use to see how it works.   Try taking rosuvastatin daily. Let me know if any new side effects but I think you will do well. Recheck 1 month.   Managing Anxiety, Adult After being diagnosed with anxiety, you may be  relieved to know why you have felt or behaved a certain way. You may also feel overwhelmed about the treatment ahead and what it will mean for your life. With care and support, you can manage this condition. How to manage lifestyle changes Managing stress and anxiety  Stress is your body's reaction to life changes and events, both good and bad. Most stress will last just a few hours, but stress can be ongoing and can lead to more than just stress. Although stress can play a major role in anxiety, it is not the same as anxiety. Stress is usually caused by something external, such as a deadline, test, or competition. Stress normally passes after the triggering event has ended.  Anxiety is caused by something internal, such as imagining a terrible outcome or worrying that something will go wrong that will devastate you. Anxiety often does not go away even after the triggering event is over, and it can become long-term (chronic) worry. It is important to understand the differences between stress and anxiety and to manage your stress effectively so that it does not lead to an anxious response. Talk with your health care provider or a counselor to learn more about reducing anxiety and stress. He or she may suggest tension reduction techniques, such as: Music therapy. Spend time creating or listening to music that you enjoy and that inspires you. Mindfulness-based meditation. Practice being aware of your normal breaths while not trying to control your breathing. It can be done while sitting or walking. Centering prayer. This involves focusing on a word, phrase, or sacred image that means something to you and brings you peace. Deep breathing. To do this, expand your stomach and inhale slowly through your nose. Hold your breath for 3-5 seconds. Then exhale slowly, letting your stomach muscles relax. Self-talk. Learn to notice and identify thought patterns that lead to anxiety reactions and change  those patterns to  thoughts that feel peaceful. Muscle relaxation. Taking time to tense muscles and then relax them. Choose a tension reduction technique that fits your lifestyle and personality. These techniques take time and practice. Set aside 5-15 minutes a day to do them. Therapists can offer counseling and training in these techniques. The training to help with anxiety may be covered by some insurance plans. Other things you can do to manage stress and anxiety include: Keeping a stress diary. This can help you learn what triggers your reaction and then learn ways to manage your response. Thinking about how you react to certain situations. You may not be able to control everything, but you can control your response. Making time for activities that help you relax and not feeling guilty about spending your time in this way. Doing visual imagery. This involves imagining or creating mental pictures to help you relax. Practicing yoga. Through yoga poses, you can lower tension and promote relaxation.  Medicines Medicines can help ease symptoms. Medicines for anxiety include: Antidepressant medicines. These are usually prescribed for long-term daily control. Anti-anxiety medicines. These may be added in severe cases, especially when panic attacks occur. Medicines will be prescribed by a health care provider. When used together, medicines, psychotherapy, and tension reduction techniques may be the most effective treatment. Relationships Relationships can play a big part in helping you recover. Try to spend more time connecting with trusted friends and family members. Consider going to couples counseling if you have a partner, taking family education classes, or going to family therapy. Therapy can help you and others better understand your condition. How to recognize changes in your anxiety Everyone responds differently to treatment for anxiety. Recovery from anxiety happens when symptoms decrease and stop interfering  with your daily activities at home or work. This may mean that you will start to: Have better concentration and focus. Worry will interfere less in your daily thinking. Sleep better. Be less irritable. Have more energy. Have improved memory. It is also important to recognize when your condition is getting worse. Contact your health care provider if your symptoms interfere with home or work and you feel like your condition is not improving. Follow these instructions at home: Activity Exercise. Adults should do the following: Exercise for at least 150 minutes each week. The exercise should increase your heart rate and make you sweat (moderate-intensity exercise). Strengthening exercises at least twice a week. Get the right amount and quality of sleep. Most adults need 7-9 hours of sleep each night. Lifestyle  Eat a healthy diet that includes plenty of vegetables, fruits, whole grains, low-fat dairy products, and lean protein. Do not eat a lot of foods that are high in fats, added sugars, or salt (sodium). Make choices that simplify your life. Do not use any products that contain nicotine or tobacco. These products include cigarettes, chewing tobacco, and vaping devices, such as e-cigarettes. If you need help quitting, ask your health care provider. Avoid caffeine, alcohol, and certain over-the-counter cold medicines. These may make you feel worse. Ask your pharmacist which medicines to avoid. General instructions Take over-the-counter and prescription medicines only as told by your health care provider. Keep all follow-up visits. This is important. Where to find support You can get help and support from these sources: Self-help groups. Online and [employer]. A trusted spiritual leader. Couples counseling. Family education classes. Family therapy. Where to find more information You may find that joining a support group helps you deal with your anxiety.  The following sources  can help you locate counselors or support groups near you: [place]: www.mentalhealthamerica.net Anxiety and Depression Association of [place] (ADAA): https://www.clark.net/ National Alliance on Mental Illness (NAMI): www.nami.org Contact a health care provider if: You have a hard time staying focused or finishing daily tasks. You spend many hours a day feeling worried about everyday life. You become exhausted by worry. You start to have headaches or frequently feel tense. You develop chronic nausea or diarrhea. Get help right away if: You have a racing heart and shortness of breath. You have thoughts of hurting yourself or others. If you ever feel like you may hurt yourself or others, or have thoughts about taking your own life, get help right away. Go to your nearest emergency department or: Call your local emergency services (911 in the [place] Call a suicide crisis helpline, such as the [place] at 6611871757 or 988 in the [place]. This is open 24 hours a day in the [place] Text the Crisis Text Line at 959 654 5142 (in the [place].). Summary Taking steps to learn and use tension reduction techniques can help calm you and help prevent triggering an anxiety reaction. When used together, medicines, psychotherapy, and tension reduction techniques may be the most effective treatment. Family, friends, and partners can play a big part in supporting you. This information is not intended to replace advice given to you by your health care provider. Make sure you discuss any questions you have with your health care provider. Document Revised: 10/03/2020 Document Reviewed: 07/01/2020 Elsevier Patient Education  [place],   Merri Ray, MD [place] 08/26/21 2:04 PM

## 2021-09-06 ENCOUNTER — Telehealth (HOSPITAL_COMMUNITY): Payer: Self-pay

## 2021-09-06 NOTE — Telephone Encounter (Signed)
No response from pt in regards to cardiac rehab. Closed referral 

## 2021-09-19 ENCOUNTER — Ambulatory Visit: Payer: 59 | Admitting: Pulmonary Disease

## 2021-09-30 ENCOUNTER — Ambulatory Visit: Payer: 59 | Admitting: Family Medicine

## 2021-10-02 ENCOUNTER — Telehealth: Payer: Self-pay | Admitting: Internal Medicine

## 2021-10-02 NOTE — Telephone Encounter (Signed)
*  STAT* If patient is at the pharmacy, call can be transferred to refill team.   1. Which medications need to be refilled? (please list name of each medication and dose if known) rosuvastatin (CRESTOR) 20 MG tablet  2. Which pharmacy/location (including street and city if local pharmacy) is medication to be sent to? [id number] [place], [place] - [address]  3. Do they need a 30 day or 90 day supply? [address]

## 2021-10-03 MED ORDER — ROSUVASTATIN CALCIUM 20 MG PO TABS: 20.0000 mg | ORAL_TABLET | Freq: Every day | ORAL | 5 refills | Status: DC

## 2021-10-03 NOTE — Telephone Encounter (Signed)
I spoke to Mr Todd [name] and he informed me that he stopped the rosuvastatin for 2 weeks as Dr Buren Kos recommended and that there was no change in how he was feeling. Mr Todd [name] believes what was causing his pain was the after effects of the procedure and he is now back to normal. [facility]

## 2021-10-10 ENCOUNTER — Telehealth: Payer: Self-pay | Admitting: Internal Medicine

## 2021-10-10 ENCOUNTER — Other Ambulatory Visit: Payer: Self-pay

## 2021-10-10 MED ORDER — CLOPIDOGREL BISULFATE 75 MG PO TABS: 75.0000 mg | ORAL_TABLET | Freq: Every day | ORAL | 2 refills | Status: DC

## 2021-10-10 MED ORDER — METOPROLOL SUCCINATE ER 25 MG PO TB24: 25.0000 mg | ORAL_TABLET | Freq: Every day | ORAL | 2 refills | Status: DC

## 2021-10-10 NOTE — Telephone Encounter (Signed)
Medication refilled and sent to desired pharmacy and patient has been notified.

## 2021-10-10 NOTE — Telephone Encounter (Signed)
*  STAT* If patient is at the pharmacy, call can be transferred to refill team.   1. Which medications need to be refilled? (please list name of each medication and dose if known) clopidogrel (PLAVIX) 75 MG tablet metoprolol succinate (TOPROL XL) 25 MG 24 hr tablet  2. Which pharmacy/location (including street and city if local pharmacy) is medication to be sent to? [id number] [place], [place] - [address]  3. Do they need a 30 day or 90 day supply? [address]

## 2021-11-04 ENCOUNTER — Encounter: Payer: Self-pay | Admitting: Internal Medicine

## 2021-11-04 ENCOUNTER — Ambulatory Visit: Payer: Commercial Managed Care - HMO | Admitting: Internal Medicine

## 2021-11-04 VITALS — BP 110/76 | HR 67 | Ht 67.0 in | Wt 172.2 lb

## 2021-11-04 DIAGNOSIS — I25118 Atherosclerotic heart disease of native coronary artery with other forms of angina pectoris: Secondary | ICD-10-CM | POA: Diagnosis not present

## 2021-11-04 DIAGNOSIS — Z955 Presence of coronary angioplasty implant and graft: Secondary | ICD-10-CM

## 2021-11-04 DIAGNOSIS — E785 Hyperlipidemia, unspecified: Secondary | ICD-10-CM | POA: Diagnosis not present

## 2021-11-04 DIAGNOSIS — R002 Palpitations: Secondary | ICD-10-CM | POA: Diagnosis not present

## 2021-11-04 NOTE — Progress Notes (Signed)
OFFICE NOTE  Chief Complaint:  Follow-up DOE, palpitations  Primary Care Physician: Wendie Agreste, MD  HPI:  Todd [name] is a 61 year old male who was previously seen at [facility] heart and vascular by Dr. Elisabeth Cara. He underwent a stress test, echocardiogram and monitor which were unrevealing. He was told he was well and did [name] need to follow back up with [place]. He recently had an episode where he had some visual abnormalities including seeing white spots. He presented to the emergency room and there was concern for TIA or stroke. Workup was generally unremarkable except for some cerebral atherosclerosis. It was also considered that he might have had a migraine although he had no headache and has no history of headaches. He currently denies any chest pain although does get some mild shortness of breath with exertion. 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 He does have a family history of heart disease in his father who apparently had an enlarged heart and fluid around the heart. He also has a history of anxiety and was briefly a medication for anxiety and depression in the past. He takes Lipitor for elevated LDL cholesterol recently which was 130.  I saw Todd [name] back in the office today. He denies any worsening palpitations. He was in the emergency room apparently this morning with blurred vision and seeing spots. He also had a headache on the right side of his head. He left apparently before being fully evaluated. He's had episodes like this before. There is question is whether this could be complicated migraine or TIA. He denies any chest pain or worsening shortness of breath.  11/12/2016  Todd [name] was seen today in follow-up. He was recently seen in the ER for chest discomfort. He says he occasionally gets some palpitations which are short-lived and chest discomfort. He's also been having visual changes. That is  typically followed by headache. He underwent neurology evaluation in the past for this as previously described in my notes. MRI showed some cerebral atherosclerosis but was felt to have no evidence of stroke. It was thought that his symptoms are related to migraine. He reports chronic stress and running his own business. She's [name] currently on medications. He was prescribed some medications for migraines ever since his symptoms improved he stopped taking them and did [name] follow-up with the neurologist. With regard to the chest pain it is mostly sharp, [name] necessarily associated with exertion or relieved by rest. Sometimes associated with palpitations.   01/04/2021  Todd [name] is seen today in follow-up.  He is considered a new patient today since he has [name] been seen in the past 3 years.  He is again having some palpitations.  They occur fairly sporadically but sometimes can be more intense.  We talked a lot about his diet.  He said he does [name] drink often but he drinks green tea.  There is between 30 to 50 mg of caffeine and every cup of green tea and therefore if he could cut that back or switch to decaffeinated it may be helpful.  He may ultimately need low-dose beta-blocker.  He is also been experiencing shortness of breath per tickly walking upstairs.  This is worsened recently.  He has no history of coronary disease although lipids in August showed significant elevation.  He says his diet is pretty unregulated, meaning he eats what ever he can.  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  He recently started on 5 mg rosuvastatin for this.  Total cholesterol was 238, HDL 46, triglycerides 123 and LDL 167.  07/31/2021  Todd [name] returns today for follow-up.  He was recently seen a number of times by Laurann Montana, NP for chest pressure.  Ultimately he underwent coronary CT angiography which showed a calcium score of 370, 45th percentile for age and sex matched control,  however he was noted to have moderate stenosis in the mid LAD.  The study was sent for [facility] which suggested flow-limiting stenosis in the mid to distal LAD.  Subsequently he was sent for cardiac catheterization and was found to have an 80% focal stenosis of the mid LAD.  He also had 30% proximal RCA and 30% first marginal stenoses.  He underwent a drug-eluting stent to the LAD which reduced the stenosis to 0.  He was advised to stay on aspirin and Plavix for minimum of 6 months and his rosuvastatin was increased to 20 mg daily.  Subsequently he has had numerous emergency department visits.  He reports his chest pressure was resolved and although he had a little bit of shortness of breath when leaving the hospital that improved as well after a few days.  He has subsequently had numerous episodes of sharp chest pain.  This is described as stabbing and along the left sternal wall.  He also has some discomfort in over the left trapezius area.  These episodes occur on a daily basis, sometimes wake him up at night or in the morning but occurred for example today in the afternoon just prior to his visit.  He is noted occasional low blood pressure with systolic blood pressures in the 90s however was seen this morning by pulmonary and blood pressure was normal.  Blood pressure today is 110/65.  He is on a low-dose of Toprol but no other medications for blood pressure.  11/04/2021  Todd [name] returns today for follow-up.  He reports he is actually doing quite a bit better.  He gets very infrequent palpitations that only last for a few seconds.  He shortness of breath is improved and his chest pain is gone.  He has had some hemorrhoidal bleeding.  He is change his diet notes better more regular bowel movements with less constipation.  He has discussed with GI about possible surgery however they are waiting on that.  From a cardiac standpoint he would be able to stop the clopidogrel for 5 days as of October 2023.  Preferably,  would like to restart it and have him continue the clopidogrel and aspirin until 1 year or April 2024.  He did have lipids in May which showed an elevated LDL of 135, however he had taken a holiday off the statin.  He reported that that did [name] actually change his symptoms so he started taking the medicine again.  He will need repeat lipid testing in a few months.   PMHx:  Past Medical History:  Diagnosis Date   Complicated migraine    Diverticulosis    External hemorrhoids    External hemorrhoids    Headache    Hx of cardiovascular stress test    Myoview 8/10:  no ischemia, EF 62%; low risk   Hx of Doppler ultrasound    Carotid US 6/15:  bilat ICA 1-39%   Hx of echocardiogram    echo 6/15:  EF 60-65%, no RWMA   Hypertension  Internal hemorrhoids    Palpitations    Stroke [facility])    TIA (transient ischemic attack)    Tubular adenoma of colon    Vision abnormalities     Past Surgical History:  Procedure Laterality Date   APPENDECTOMY  12/01/11   lap appy   CORONARY STENT INTERVENTION N/A 07/17/2021   Procedure: CORONARY STENT INTERVENTION;  Surgeon: Burnell Blanks, MD;  Location: [place] CV LAB;  Service: Cardiovascular;  Laterality: N/A;   LAPAROSCOPIC APPENDECTOMY  12/02/2011   Procedure: APPENDECTOMY LAPAROSCOPIC;  Surgeon: Madilyn Hook, DO;  Location: [facility];  Service: General;  Laterality: N/A;   LEFT HEART CATH AND CORONARY ANGIOGRAPHY N/A 07/17/2021   Procedure: LEFT HEART CATH AND CORONARY ANGIOGRAPHY;  Surgeon: Burnell Blanks, MD;  Location: [place] CV LAB;  Service: Cardiovascular;  Laterality: N/A;    FAMHx:  Family History  Problem Relation Age of Onset   Hypertension Mother    Hypertension Father    Anemia Neg Hx    Arrhythmia Neg Hx    Asthma Neg Hx    Clotting disorder Neg Hx    Fainting Neg Hx    Heart attack Neg Hx    Heart disease Neg Hx    Heart failure Neg Hx    Hyperlipidemia Neg Hx    Colon cancer Neg Hx    Esophageal  cancer Neg Hx    Stomach cancer Neg Hx    Rectal cancer Neg Hx     SOCHx:   reports that he has never smoked. He has never used smokeless tobacco. He reports that he does [name] drink alcohol and does [name] use drugs.  ALLERGIES:  Allergies  Allergen Reactions   Wellbutrin [Bupropion Hcl] Itching and Rash    ROS: Pertinent items noted in HPI and remainder of comprehensive ROS otherwise negative.  HOME MEDS: Current Outpatient Medications  Medication Sig Dispense Refill   aspirin EC 81 MG tablet Take 81 mg by mouth daily. Swallow whole.     clopidogrel (PLAVIX) 75 MG tablet Take 1 tablet (75 mg total) by mouth daily with breakfast. 90 tablet 2   hydrocortisone (ANUSOL-HC) 2.5 % rectal cream Place 1 application. rectally 2 (two) times daily. 30 g 0   metoprolol succinate (TOPROL XL) 25 MG 24 hr tablet Take 1 tablet (25 mg total) by mouth daily. 90 tablet 2   nitroGLYCERIN (NITROSTAT) 0.4 MG SL tablet Place 1 tablet (0.4 mg total) under the tongue every 5 (five) minutes as needed for chest pain. 25 tablet 3   rosuvastatin (CRESTOR) 20 MG tablet Take 1 tablet (20 mg total) by mouth daily. 30 tablet 5   ALPRAZolam (XANAX) 0.5 MG tablet Take 1 tablet (0.5 mg total) by mouth 2 (two) times daily as needed for anxiety. (Patient [name] taking: Reported on 11/04/2021) 90 tablet 2   senna (SENOKOT) 8.6 MG TABS tablet Take 1 tablet (8.6 mg total) by mouth in the morning and at bedtime. (Patient [name] taking: Reported on 11/04/2021) 60 tablet 0   sertraline (ZOLOFT) 25 MG tablet Take 1 tablet (25 mg total) by mouth daily. (Patient [name] taking: Reported on 11/04/2021) 30 tablet 3   No current facility-administered medications for this visit.    LABS/IMAGING: No results found for this or any previous visit (from the past 48 hour(s)). No results found.  VITALS: BP 110/76   Pulse 67   Ht '5\' 7"'$  (1.702 m)   Wt 172 lb 3.2 oz (78.1 kg)   SpO2  97%   BMI 26.97 kg/m   EXAM: General appearance: alert and no  distress Neck: no carotid bruit and no JVD Lungs: clear to auscultation bilaterally Heart: regular rate and rhythm, S1, S2 normal, no murmur, click, rub or gallop Abdomen: soft, non-tender; bowel sounds normal; no masses,  no organomegaly Extremities: extremities normal, atraumatic, no cyanosis or edema Pulses: 2+ and symmetric Skin: Skin color, texture, turgor normal. No rashes or lesions Neurologic: Grossly normal Psych: Mildly anxious  EKG: N/A  ASSESSMENT: Coronary artery disease status post PCI to the mid LAD with DES (06/2021) Abnormal coronary CT with CAC score of 370, 45th percentile for age and sex matched control DOE Palpitations Visual changes with headache - suspect migraine Mild dyspnea with exertion Atherosclerosis/dyslipidemia Anxiety   PLAN: 1.   Mr. Sm[place]urst he is doing much better.  He is now about 4 months out from his PCI to the LAD.  In October he is able to stop the Plavix electively for 5 days if he needed hemorrhoidal surgery or other procedures and then restarted afterwards with the plan to discontinue it at 1 year in April 2024.  Otherwise, he is doing well.  He has very infrequent palpitations that do [name] last long.  We will continue his current dose metoprolol.  Blood pressure is well controlled.  His cholesterol was above target however he was on statin holiday when that was assessed.  We will repeat a lipid profile in about 3 months.  And follow-up with me in April 2024.  Pixie Casino, MD, [facility], [place] Director of the Advanced Lipid Disorders &  Cardiovascular Risk Reduction Clinic Diplomate of the American Board of Clinical Lipidology Attending Cardiologist  Direct Dial: [phone]  Fax: [phone]  Website:  [email or web address] 11/04/2021, 8:10 AM

## 2021-11-04 NOTE — Patient Instructions (Signed)
Medication Instructions:  The current medical regimen is effective;  continue present plan and medications.  *If you need a refill on your cardiac medications before your next appointment, please call your pharmacy*   Lab Work: LIPID (fasting, in 3 months, no lab appointment needed)   If you have labs (blood work) drawn today and your tests are completely normal, you will receive your results only by: [place] (if you have MyChart) OR A paper copy in the mail If you have any lab test that is abnormal or we need to change your treatment, we will call you to review the results.    Follow-Up: At [facility], you and your health needs are our priority.  As part of our continuing mission to provide you with exceptional heart care, we have created designated Provider Care Teams.  These Care Teams include your primary Cardiologist (physician) and Advanced Practice Providers (APPs -  Physician Assistants and Nurse Practitioners) who all work together to provide you with the care you need, when you need it.  We recommend signing up for the patient portal called "MyChart".  Sign up information is provided on this After Visit Summary.  MyChart is used to connect with patients for Virtual Visits (Telemedicine).  Patients are able to view lab/test results, encounter notes, upcoming appointments, etc.  Non-urgent messages can be sent to your provider as well.   To learn more about what you can do with MyChart, go to [email or web address].    Your next appointment:   8 month(s) (April)  The format for your next appointment:   In Person  Provider:   Pixie Casino, MD

## 2021-11-11 ENCOUNTER — Encounter (HOSPITAL_BASED_OUTPATIENT_CLINIC_OR_DEPARTMENT_OTHER): Payer: Self-pay

## 2021-11-11 ENCOUNTER — Emergency Department (HOSPITAL_BASED_OUTPATIENT_CLINIC_OR_DEPARTMENT_OTHER)
Admission: EM | Admit: 2021-11-11 | Discharge: 2021-11-11 | Discharge status: Against Medical Advice | Payer: Commercial Managed Care - HMO | Attending: Emergency Medicine | Admitting: Emergency Medicine

## 2021-11-11 DIAGNOSIS — R509 Fever, unspecified: Secondary | ICD-10-CM | POA: Insufficient documentation

## 2021-11-11 DIAGNOSIS — M545 Low back pain, unspecified: Secondary | ICD-10-CM | POA: Insufficient documentation

## 2021-11-11 DIAGNOSIS — Z5321 Procedure and treatment not carried out due to patient leaving prior to being seen by health care provider: Secondary | ICD-10-CM | POA: Insufficient documentation

## 2021-11-11 NOTE — ED Triage Notes (Signed)
Pt presents to the ED with lower back pain, fever, and body aches. Had a covid/flu test at [facility] prior to arrival that was negative. Pt states that they sent him here because he is a heart patient. Denies cough, SHOB, N/V/D, or chest pain. Pt A&Ox4 at time of triage. VSS. Denies urinary sx. Last took tylenol 3hrs ago

## 2021-11-14 ENCOUNTER — Telehealth: Payer: Self-pay | Admitting: Family Medicine

## 2021-11-14 NOTE — Telephone Encounter (Signed)
[  3:52 PM] Todd [name]  Hey Dr. Carlota Raspberry this patient states he is on a Blood thinner and bit his tongue and he is concerned about the bleeding and redness. Sent in teams as well.

## 2021-11-15 NOTE — Telephone Encounter (Signed)
Discussed with Brooke.  If small amount of bleeding, can apply tea bag to area of bleeding but if that does not stop bleeding quickly, recommended urgent care or ER evaluation.

## 2021-12-02 ENCOUNTER — Other Ambulatory Visit (HOSPITAL_COMMUNITY): Payer: Self-pay | Admitting: Cardiology

## 2021-12-10 ENCOUNTER — Ambulatory Visit: Payer: 59 | Admitting: Pulmonary Disease

## 2022-01-02 ENCOUNTER — Ambulatory Visit: Payer: Commercial Managed Care - HMO | Admitting: Family Medicine

## 2022-02-05 ENCOUNTER — Ambulatory Visit: Payer: Commercial Managed Care - HMO | Admitting: Family Medicine

## 2022-02-06 ENCOUNTER — Ambulatory Visit (INDEPENDENT_AMBULATORY_CARE_PROVIDER_SITE_OTHER): Payer: Commercial Managed Care - HMO | Admitting: Family Medicine

## 2022-02-06 ENCOUNTER — Encounter: Payer: Self-pay | Admitting: Family Medicine

## 2022-02-06 VITALS — BP 118/80 | HR 57 | Temp 98.0°F | Ht 67.0 in | Wt 170.4 lb

## 2022-02-06 DIAGNOSIS — B354 Tinea corporis: Secondary | ICD-10-CM | POA: Diagnosis not present

## 2022-02-06 DIAGNOSIS — K649 Unspecified hemorrhoids: Secondary | ICD-10-CM

## 2022-02-06 MED ORDER — HYDROCORTISONE ACETATE 25 MG RE SUPP: 25.0000 mg | Freq: Two times a day (BID) | RECTAL | 1 refills | Status: DC

## 2022-02-06 MED ORDER — CLOTRIMAZOLE-BETAMETHASONE 1-0.05 % EX CREA: 1.0000 | TOPICAL_CREAM | Freq: Every day | CUTANEOUS | 1 refills | Status: DC

## 2022-02-06 NOTE — Progress Notes (Signed)
Subjective:  Patient ID: Todd [name], male    DOB: 1960/10/04  Age: 61 y.o. MRN: [id number]  CC:  Chief Complaint  Patient presents with   spot on back    Pt has a spot on his back that has been there for about 3 months    Hemorrhoids    Pt states he is having issues with hemorrhoids     HPI Todd [name] presents for   Spot on back: Started with a circle a few months ago and spread, got bigger then same size past few weeks, itches. No d/c or bleeding.  Tx: none.   Hemorrhoids Has seen specialist. Plan for surgery when he comes off blood thinner.  Needs rf of hydrocortisone '25mg'$  suppositories. Uses once every 2 weeks - working ok. Rx by specialist. Has used anusol rectal cream with milder sx's.   History Patient Active Problem List   Diagnosis Date Noted   NSTEMI (non-ST elevated myocardial infarction) ([place]) 07/20/2021   CAD (coronary artery disease) 07/18/2021   Hyperlipidemia 07/18/2021   Unstable angina ([facility])    Shortness of breath 11/12/2016   Chest pain 11/12/2016   Heart palpitations 10/20/2013   Situational anxiety 10/20/2013   Migraine with visual aura 08/24/2013   TIA (transient ischemic attack) 08/23/2013   Blurred vision 08/23/2013   Anomia 08/23/2013   Past Medical History:  Diagnosis Date   Complicated migraine    Diverticulosis    External hemorrhoids    External hemorrhoids    Headache    Hx of cardiovascular stress test    Myoview 8/10:  no ischemia, EF 62%; low risk   Hx of Doppler ultrasound    Carotid US 6/15:  bilat ICA 1-39%   Hx of echocardiogram    echo 6/15:  EF 60-65%, no RWMA   Hypertension    Internal hemorrhoids    Palpitations    Stroke ([place])    TIA (transient ischemic attack)    Tubular adenoma of colon    Vision abnormalities    Past Surgical History:  Procedure Laterality Date   APPENDECTOMY  12/01/11   lap appy   CORONARY STENT INTERVENTION N/A 07/17/2021   Procedure: CORONARY STENT INTERVENTION;  Surgeon: Burnell Blanks, MD;  Location: [place] [facility];  Service: Cardiovascular;  Laterality: N/A;   LAPAROSCOPIC APPENDECTOMY  12/02/2011   Procedure: APPENDECTOMY LAPAROSCOPIC;  Surgeon: Madilyn Hook, DO;  Location: [facility];  Service: General;  Laterality: N/A;   LEFT HEART CATH AND CORONARY ANGIOGRAPHY N/A 07/17/2021   Procedure: LEFT HEART CATH AND CORONARY ANGIOGRAPHY;  Surgeon: Burnell Blanks, MD;  Location: [place] [facility];  Service: Cardiovascular;  Laterality: N/A;   Allergies  Allergen Reactions   Wellbutrin [Bupropion Hcl] Itching and Rash   Prior to Admission medications   Medication Sig Start Date End Date Taking? Authorizing Provider  aspirin EC 81 MG tablet Take 81 mg by mouth daily. Swallow whole.   Yes [provider]  clopidogrel (PLAVIX) 75 MG tablet Take 1 tablet (75 mg total) by mouth daily with breakfast. 10/10/21  Yes Sande Rives E, PA-C  metoprolol succinate (TOPROL XL) 25 MG 24 hr tablet Take 1 tablet (25 mg total) by mouth daily. 10/10/21  Yes Sande Rives E, PA-C  rosuvastatin (CRESTOR) 20 MG tablet Take 1 tablet (20 mg total) by mouth daily. 10/03/21  Yes Hilty, Nadean Corwin, MD  ALPRAZolam Duanne Moron) 0.5 MG tablet Take 1 tablet (0.5 mg total) by mouth 2 (two) times  daily as needed for anxiety. Patient not taking: Reported on 11/04/2021 08/05/21   Laurin Coder, MD  hydrocortisone (ANUSOL-HC) 2.5 % rectal cream Place 1 application. rectally 2 (two) times daily. Patient not taking: Reported on 02/06/2022 07/24/21   Tonye Pearson, PA-C  nitroGLYCERIN (NITROSTAT) 0.4 MG SL tablet Place 1 tablet (0.4 mg total) under the tongue every 5 (five) minutes as needed for chest pain. 07/12/21 11/04/21  Loel Dubonnet, NP  senna (SENOKOT) 8.6 MG TABS tablet Take 1 tablet (8.6 mg total) by mouth in the morning and at bedtime. Patient not taking: Reported on 11/04/2021 07/23/21   Evlyn Courier, PA-C  sertraline (ZOLOFT) 25 MG tablet Take 1 tablet (25 mg total) by mouth  daily. Patient not taking: Reported on 11/04/2021 08/12/21   Wendie Agreste, MD   Social History   Socioeconomic History   Marital status: Divorced    Spouse name: Not on file   Number of children: Not on file   Years of education: Not on file   Highest education level: Not on file  Occupational History   Occupation: part time  Tobacco Use   Smoking status: Never   Smokeless tobacco: Never  Vaping Use   Vaping Use: Never used  Substance and Sexual Activity   Alcohol use: No   Drug use: No   Sexual activity: Not on file  Other Topics Concern   Not on file  Social History Narrative   Lives alone   Right Handed   Drinks very little caffeine   Social Determinants of Health   Financial Resource Strain: Not on file  Food Insecurity: Not on file  Transportation Needs: Not on file  Physical Activity: Not on file  Stress: Not on file  Social Connections: Not on file  Intimate Partner Violence: Not on file    Review of Systems Per HPI.   Objective:   Vitals:   02/06/22 1143  BP: 118/80  Pulse: (!) 57  Temp: 98 F (36.7 C)  SpO2: 98%  Weight: 170 lb 6.4 oz (77.3 kg)  Height: '5\' 7"'$  (1.702 m)     Physical Exam Constitutional:      General: He is not in acute distress.    Appearance: Normal appearance. He is well-developed.  HENT:     Head: Normocephalic and atraumatic.  Cardiovascular:     Rate and Rhythm: Normal rate.  Pulmonary:     Effort: Pulmonary effort is normal.  Skin:      Neurological:     Mental Status: He is alert and oriented to person, place, and time.  Psychiatric:        Mood and Affect: Mood normal.         Assessment & Plan:  Todd [name] is a 61 y.o. male . Hemorrhoids, unspecified hemorrhoid type - Plan: hydrocortisone (ANUSOL-HC) 25 MG suppository  - infrequent, unable to stop blood thinner for procedure at this time. Continue as needed hydrocortisone as infrequent use, with rtc precautions or follow up with GI if more  frequent   Tinea corporis - Plan: clotrimazole-betamethasone (LOTRISONE) cream  - Rash appears to be tinea corporis.  We will try Lotrisone cream to help with itching and antifungal.  RTC precautions if not improving next few weeks, and if it does improve, recommended continued treatment for an additional week after rash clears.  Meds ordered this encounter  Medications   hydrocortisone (ANUSOL-HC) 25 MG suppository    Sig: Place 1 suppository (25 mg total)  rectally 2 (two) times daily. As needed with flare.    Dispense:  12 suppository    Refill:  1   clotrimazole-betamethasone (LOTRISONE) cream    Sig: Apply 1 Application topically daily.    Dispense:  45 g    Refill:  1   Patient Instructions  Rash on back appears to be ringworm.  Apply cream twice per day until that rash has resolved and then continue 1 additional week.  If not improving with the cream in the next few weeks or any worsening symptoms return for recheck.  Hydrocortisone for hemorrhoids refilled, use sparingly.  If any worsening symptoms please follow-up with specialist.  Let me know if there are questions and take care.    Signed,   Merri Ray, MD [place] 02/06/22 1:03 PM

## 2022-02-06 NOTE — Patient Instructions (Addendum)
Rash on back appears to be ringworm.  Apply cream twice per day until that rash has resolved and then continue 1 additional week.  If not improving with the cream in the next few weeks or any worsening symptoms return for recheck.  Hydrocortisone for hemorrhoids refilled, use sparingly.  If any worsening symptoms please follow-up with specialist.  Let me know if there are questions and take care.  Body Ringworm Body ringworm is an infection of the skin that often causes a ring-shaped rash. Body ringworm is also called tinea corporis. Body ringworm can affect any part of your skin. This condition is easily spread from person to person (is very contagious). What are the causes? This condition is caused by fungi called dermatophytes. The condition develops when these fungi grow out of control on the skin. You can get this condition if you touch a person or animal that has it. You can also get it if you share any items with an infected person or pet. These include: Clothing, bedding, and towels. Brushes or combs. Gym equipment. Any other object that has the fungus on it. What increases the risk? You are more likely to develop this condition if you: Play sports that involve close physical contact, such as wrestling. Sweat a lot. Live in areas that are hot and humid. Use public showers. Have a weakened disease-fighting system (immune system). What are the signs or symptoms? Symptoms of this condition include: Itchy, raised red spots and bumps. Red scaly patches. A ring-shaped rash. The rash may have: A clear center. Scales or red bumps at its center. Redness near its borders. Dry and scaly skin on or around it. How is this diagnosed? This condition can usually be diagnosed with a skin exam. A skin scraping may be taken from the affected area and examined under a microscope to see if the fungus is present. How is this treated? This condition may be treated with: An antifungal cream or  ointment. An antifungal shampoo. Antifungal medicines. These may be prescribed if your ringworm: Is severe. Keeps coming back or lasts a long time. Follow these instructions at home: Take over-the-counter and prescription medicines only as told by your health care provider. If you were given an antifungal cream or ointment: Use it as told by your health care provider. Wash the infected area and dry it completely before applying the cream or ointment. If you were given an antifungal shampoo: Use it as told by your health care provider. Leave the shampoo on your body for 3-5 minutes before rinsing. While you have a rash: Wear loose clothing to stop clothes from rubbing and irritating it. Wash or change your bed sheets every night. Wash clothes and bed sheets in hot water. Disinfect or throw out items that may be infected. Wash your hands often with soap and water for at least 20 seconds. If soap and water are not available, use hand sanitizer. If your pet has the same infection, take your pet to see a veterinarian for treatment. How is this prevented? Take a bath or shower every day and after every time you work out or play sports. Dry your skin completely after bathing. Wear sandals or shoes in public places and showers. Wash athletic clothes after each use. Do not share personal items with others. Avoid touching red patches of skin on other people. Avoid touching pets that have bald spots. If you touch an animal that has a bald spot, wash your hands. Contact a health care provider if:  Your rash continues to spread after 7 days of treatment. Your rash is not gone in 4 weeks. The area around your rash gets red, warm, tender, and swollen. This information is not intended to replace advice given to you by your health care provider. Make sure you discuss any questions you have with your health care provider. Document Revised: 08/22/2021 Document Reviewed: 08/22/2021 Elsevier Patient  Education  Todd [name].

## 2022-02-20 ENCOUNTER — Encounter (HOSPITAL_COMMUNITY): Payer: Self-pay | Admitting: Emergency Medicine

## 2022-02-20 ENCOUNTER — Ambulatory Visit (HOSPITAL_COMMUNITY)
Admission: EM | Admit: 2022-02-20 | Discharge: 2022-02-20 | Disposition: A | Discharge status: Home / Self Care | Payer: Commercial Managed Care - HMO | Attending: Internal Medicine | Admitting: Internal Medicine

## 2022-02-20 ENCOUNTER — Other Ambulatory Visit: Payer: Self-pay

## 2022-02-20 DIAGNOSIS — J029 Acute pharyngitis, unspecified: Secondary | ICD-10-CM | POA: Insufficient documentation

## 2022-02-20 DIAGNOSIS — Z955 Presence of coronary angioplasty implant and graft: Secondary | ICD-10-CM | POA: Insufficient documentation

## 2022-02-20 DIAGNOSIS — R059 Cough, unspecified: Secondary | ICD-10-CM | POA: Insufficient documentation

## 2022-02-20 DIAGNOSIS — Z7902 Long term (current) use of antithrombotics/antiplatelets: Secondary | ICD-10-CM | POA: Diagnosis not present

## 2022-02-20 DIAGNOSIS — Z1152 Encounter for screening for COVID-19: Secondary | ICD-10-CM | POA: Diagnosis not present

## 2022-02-20 DIAGNOSIS — J069 Acute upper respiratory infection, unspecified: Secondary | ICD-10-CM

## 2022-02-20 MED ORDER — BENZONATATE 100 MG PO CAPS: 100.0000 mg | ORAL_CAPSULE | Freq: Three times a day (TID) | ORAL | 0 refills | Status: DC

## 2022-02-20 NOTE — Discharge Instructions (Addendum)
You have a viral upper respiratory infection.  COVID-19 testing is pending. We will call you with results if positive. If your COVID test is positive, you must stay at home until day 6 of symptoms. On day 6, you may go out into public and go back to work, but you must wear a mask until day 11 of symptoms to prevent spread to others.  Take guaifenesin '1200mg'$   every 12 hours to thin your mucous so that you can get it out of your body easier with coughing/blowing your nose. Drink plenty of water while taking this medication so that it works well in your body (at least 8 cups a day).  May purchase this over-the-counter (Mucinex).  Take tessalon pearles every 8 hours as needed for cough.  This has been sent to the pharmacy for you.  You may take Tylenol 1000 mg every 6 hours as needed for fever, chills, sore throat, and aches and pains.  You may do salt water and baking soda gargles every 4 hours as needed for your throat pain.  Please put 1 teaspoon of salt and 1/2 teaspoon of baking soda in 8 ounces of warm water then gargle and spit the water out. You may also put 1 tablespoon of honey in warm water and drink this to soothe your throat.  Place a humidifier in your room at night to help decrease dry air that can irritate your airway and cause you to have a sore throat and cough.  Please try to eat a well-balanced diet while you are sick so that your body gets proper nutrition to heal.  If you develop any new or worsening symptoms, please return.  If your symptoms are severe, please go to the emergency room.  Follow-up with your primary care provider for further evaluation and management of your symptoms as well as ongoing wellness visits.  I hope you feel better!

## 2022-02-20 NOTE — ED Provider Notes (Signed)
[place]    CSN: [id number] Arrival date & time: 02/20/22  1930      History   Chief Complaint Chief Complaint  Patient presents with   URI    HPI Todd [name] is a 61 y.o. male.   Patient presents urgent care for evaluation of cough, nasal congestion, sore throat, intermittent headache, and fatigue for the last 3 days. Headache is intermittent and generalized without vision changes or dizziness. [facility] where he found his BP to be low at 97/72. Patient takes Plavix and metoprolol due to history of coronary stenting.  He has been taking all of his home medications as prescribed and denies recent falls.  Blood pressure is currently 126/76 and he is not dizzy, nauseous, or lightheaded.  He denies fever and chills, nausea, vomiting, diarrhea, abdominal pain, body aches, and known sick contacts.  Denies chest pain, heart palpitations, shortness of breath, wheeze, and weakness.  No recent antibiotic or steroid use.  Patient is not a smoker and denies drug use.  No recent travel.  He has not attempted use of any over-the-counter medications prior to arrival urgent care due to fear that over-the-counter medications may interact with his Plavix.    URI   Past Medical History:  Diagnosis Date   Complicated migraine    Diverticulosis    External hemorrhoids    External hemorrhoids    Headache    Hx of cardiovascular stress test    Myoview 8/10:  no ischemia, EF 62%; low risk   Hx of Doppler ultrasound    Carotid US 6/15:  bilat ICA 1-39%   Hx of echocardiogram    echo 6/15:  EF 60-65%, no RWMA   Hypertension    Internal hemorrhoids    Palpitations    Stroke [facility])    TIA (transient ischemic attack)    Tubular adenoma of colon    Vision abnormalities     Patient Active Problem List   Diagnosis Date Noted   NSTEMI (non-ST elevated myocardial infarction) ([place]) 07/20/2021   CAD (coronary artery disease) 07/18/2021   Hyperlipidemia 07/18/2021    Unstable angina (HCC)    Shortness of breath 11/12/2016   Chest pain 11/12/2016   Heart palpitations 10/20/2013   Situational anxiety 10/20/2013   Migraine with visual aura 08/24/2013   TIA (transient ischemic attack) 08/23/2013   Blurred vision 08/23/2013   Anomia 08/23/2013    Past Surgical History:  Procedure Laterality Date   APPENDECTOMY  12/01/11   lap appy   CORONARY STENT INTERVENTION N/A 07/17/2021   Procedure: CORONARY STENT INTERVENTION;  Surgeon: Burnell Blanks, MD;  Location: [place] [facility];  Service: Cardiovascular;  Laterality: N/A;   LAPAROSCOPIC APPENDECTOMY  12/02/2011   Procedure: APPENDECTOMY LAPAROSCOPIC;  Surgeon: Madilyn Hook, DO;  Location: [facility];  Service: General;  Laterality: N/A;   LEFT HEART CATH AND CORONARY ANGIOGRAPHY N/A 07/17/2021   Procedure: LEFT HEART CATH AND CORONARY ANGIOGRAPHY;  Surgeon: Burnell Blanks, MD;  Location: [place] [facility];  Service: Cardiovascular;  Laterality: N/A;       Home Medications    Prior to Admission medications   Medication Sig Start Date End Date Taking? Authorizing Provider  benzonatate (TESSALON) 100 MG capsule Take 1 capsule (100 mg total) by mouth every 8 (eight) hours. 02/20/22  Yes Caliann Leckrone, Stasia Cavalier, FNP  ALPRAZolam Duanne Moron) 0.5 MG tablet Take 1 tablet (0.5 mg total) by mouth 2 (two) times daily as needed for  anxiety. Patient not taking: Reported on 11/04/2021 08/05/21   Laurin Coder, MD  aspirin EC 81 MG tablet Take 81 mg by mouth daily. Swallow whole.    [provider]  clopidogrel (PLAVIX) 75 MG tablet Take 1 tablet (75 mg total) by mouth daily with breakfast. 10/10/21   Sande Rives E, PA-C  clotrimazole-betamethasone (LOTRISONE) cream Apply 1 Application topically daily. 02/06/22   Wendie Agreste, MD  hydrocortisone (ANUSOL-HC) 2.5 % rectal cream Place 1 application. rectally 2 (two) times daily. Patient not taking: Reported on 02/06/2022 07/24/21   Tonye Pearson, PA-C  hydrocortisone (ANUSOL-HC) 25 MG suppository Place 1 suppository (25 mg total) rectally 2 (two) times daily. As needed with flare. 02/06/22   Wendie Agreste, MD  metoprolol succinate (TOPROL XL) 25 MG 24 hr tablet Take 1 tablet (25 mg total) by mouth daily. 10/10/21   Darreld Mclean, PA-C  nitroGLYCERIN (NITROSTAT) 0.4 MG SL tablet Place 1 tablet (0.4 mg total) under the tongue every 5 (five) minutes as needed for chest pain. 07/12/21 11/04/21  Loel Dubonnet, NP  rosuvastatin (CRESTOR) 20 MG tablet Take 1 tablet (20 mg total) by mouth daily. 10/03/21   Hilty, Nadean Corwin, MD  senna (SENOKOT) 8.6 MG TABS tablet Take 1 tablet (8.6 mg total) by mouth in the morning and at bedtime. Patient not taking: Reported on 11/04/2021 07/23/21   Evlyn Courier, PA-C  sertraline (ZOLOFT) 25 MG tablet Take 1 tablet (25 mg total) by mouth daily. Patient not taking: Reported on 11/04/2021 08/12/21   Wendie Agreste, MD    Family History Family History  Problem Relation Age of Onset   Hypertension Mother    Hypertension Father    Anemia Neg Hx    Arrhythmia Neg Hx    Asthma Neg Hx    Clotting disorder Neg Hx    Fainting Neg Hx    Heart attack Neg Hx    Heart disease Neg Hx    Heart failure Neg Hx    Hyperlipidemia Neg Hx    Colon cancer Neg Hx    Esophageal cancer Neg Hx    Stomach cancer Neg Hx    Rectal cancer Neg Hx     Social History Social History   Tobacco Use   Smoking status: Never   Smokeless tobacco: Never  Vaping Use   Vaping Use: Never used  Substance Use Topics   Alcohol use: No   Drug use: No     Allergies   Wellbutrin [bupropion hcl]   Review of Systems Review of Systems Per HPI  Physical Exam Triage Vital Signs ED Triage Vitals  Enc Vitals Group     BP 02/20/22 1947 126/76     Pulse Rate 02/20/22 1947 82     Resp 02/20/22 1947 18     Temp 02/20/22 1947 98.7 F (37.1 C)     Temp Source 02/20/22 1947 Oral     SpO2 02/20/22 1947 97 %     Weight --       Height --      Head Circumference --      Peak Flow --      Pain Score 02/20/22 1945 5     Pain Loc --      Pain Edu? --      Excl. in [place]? --    No data found.  Updated Vital Signs BP 126/76 (BP Location: Right Arm)   Pulse 82   Temp 98.7  F (37.1 C) (Oral)   Resp 18   SpO2 97%   Visual Acuity Right Eye Distance:   Left Eye Distance:   Bilateral Distance:    Right Eye Near:   Left Eye Near:    Bilateral Near:     Physical Exam Vitals and nursing note reviewed.  Constitutional:      Appearance: He is not ill-appearing or toxic-appearing.  HENT:     Head: Normocephalic and atraumatic.     Right Ear: Hearing, tympanic membrane, ear canal and external ear normal.     Left Ear: Hearing, tympanic membrane, ear canal and external ear normal.     Nose: Congestion present.     Mouth/Throat:     Lips: Pink.     Mouth: Mucous membranes are moist.     Pharynx: Posterior oropharyngeal erythema present.     Comments: Small amount of clear postnasal drainage visualized to the posterior oropharynx.  Eyes:     General: Lids are normal. Vision grossly intact. Gaze aligned appropriately.        Right eye: No discharge.        Left eye: No discharge.     Extraocular Movements: Extraocular movements intact.     Conjunctiva/sclera: Conjunctivae normal.     Pupils: Pupils are equal, round, and reactive to light.  Cardiovascular:     Rate and Rhythm: Normal rate and regular rhythm.     Heart sounds: Normal heart sounds, S1 normal and S2 normal.  Pulmonary:     Effort: Pulmonary effort is normal. No respiratory distress.     Breath sounds: Normal breath sounds and air entry.  Abdominal:     General: Bowel sounds are normal.     Palpations: Abdomen is soft.     Tenderness: There is no abdominal tenderness. There is no right CVA tenderness, left CVA tenderness or guarding.  Musculoskeletal:     Cervical back: Neck supple.     Right lower leg: No edema.     Left lower leg: No edema.   Lymphadenopathy:     Cervical: No cervical adenopathy.  Skin:    General: Skin is warm and dry.     Capillary Refill: Capillary refill takes less than 2 seconds.     Findings: No rash.  Neurological:     General: No focal deficit present.     Mental Status: He is alert and oriented to person, place, and time. Mental status is at baseline.     Cranial Nerves: No dysarthria or facial asymmetry.  Psychiatric:        Mood and Affect: Mood normal.        Speech: Speech normal.        Behavior: Behavior normal.        Thought Content: Thought content normal.        Judgment: Judgment normal.      UC Treatments / Results  Labs (all labs ordered are listed, but only abnormal results are displayed) Labs Reviewed  SARS CORONAVIRUS 2 (TAT 6-24 HRS)    EKG   Radiology No results found.  Procedures Procedures (including critical care time)  Medications Ordered in UC Medications - No data to display  Initial Impression / Assessment and Plan / UC Course  I have reviewed the triage vital signs and the nursing notes.  Pertinent labs & imaging results that were available during my care of the patient were reviewed by me and considered in my medical decision making (see chart  for details).   Viral URI with cough Symptoms and physical exam consistent with a viral upper respiratory tract infection that will likely resolve with rest, fluids, and prescriptions for symptomatic relief. No indication for imaging today based on stable cardiopulmonary exam and hemodynamically stable vital signs. COVID-19 testing is pending.  We will call patient if this is positive.  Quarantine guidelines discussed. Currently on day 4 of symptoms and does qualify for antiviral therapy.   Tessalon pearles sent to pharmacy for symptomatic relief to be taken as prescribed. May purchase mucinex over the counter and use this to help with nasal congestion and cough.   May use ibuprofen/tylenol over the counter for  body aches, fever/chills, and overall discomfort associated with viral illness. Nonpharmacologic interventions for symptom relief provided and after visit summary below.   Strict ED/urgent care return precautions given.  Patient verbalizes understanding and agreement with plan.  Counseled patient regarding possible side effects and uses of all medications prescribed at today's visit.  Patient verbalizes understanding and agreement with plan.  All questions answered.  Patient discharged from urgent care in stable condition.       Final Clinical Impressions(s) / UC Diagnoses   Final diagnoses:  Viral URI with cough     Discharge Instructions      You have a viral upper respiratory infection.  COVID-19 testing is pending. We will call you with results if positive. If your COVID test is positive, you must stay at home until day 6 of symptoms. On day 6, you may go out into public and go back to work, but you must wear a mask until day 11 of symptoms to prevent spread to others.  Take guaifenesin '1200mg'$   every 12 hours to thin your mucous so that you can get it out of your body easier with coughing/blowing your nose. Drink plenty of water while taking this medication so that it works well in your body (at least 8 cups a day).  May purchase this over-the-counter (Mucinex).  Take tessalon pearles every 8 hours as needed for cough.  This has been sent to the pharmacy for you.  You may take Tylenol 1000 mg every 6 hours as needed for fever, chills, sore throat, and aches and pains.  You may do salt water and baking soda gargles every 4 hours as needed for your throat pain.  Please put 1 teaspoon of salt and 1/2 teaspoon of baking soda in 8 ounces of warm water then gargle and spit the water out. You may also put 1 tablespoon of honey in warm water and drink this to soothe your throat.  Place a humidifier in your room at night to help decrease dry air that can irritate your airway and cause you to  have a sore throat and cough.  Please try to eat a well-balanced diet while you are sick so that your body gets proper nutrition to heal.  If you develop any new or worsening symptoms, please return.  If your symptoms are severe, please go to the emergency room.  Follow-up with your primary care provider for further evaluation and management of your symptoms as well as ongoing wellness visits.  I hope you feel better!      ED Prescriptions     Medication Sig Dispense Auth. Provider   benzonatate (TESSALON) 100 MG capsule Take 1 capsule (100 mg total) by mouth every 8 (eight) hours. 21 capsule Talbot Grumbling, FNP      PDMP not reviewed this  encounter.   Talbot Grumbling, [place] 02/23/22 1844

## 2022-02-20 NOTE — ED Triage Notes (Signed)
Symptoms started 3 days ago.  Denies any phlegm with coughing.  Patient has a cough, runny nose, chills, and fatigue, sore throat.    Patient has not taken any medications for symptoms

## 2022-02-21 LAB — SARS CORONAVIRUS 2 (TAT 6-24 HRS): SARS Coronavirus 2: NEGATIVE

## 2022-03-28 ENCOUNTER — Encounter (HOSPITAL_BASED_OUTPATIENT_CLINIC_OR_DEPARTMENT_OTHER): Payer: Self-pay | Admitting: Urology

## 2022-03-28 ENCOUNTER — Other Ambulatory Visit: Payer: Self-pay

## 2022-03-28 ENCOUNTER — Emergency Department (HOSPITAL_BASED_OUTPATIENT_CLINIC_OR_DEPARTMENT_OTHER)
Admission: EM | Admit: 2022-03-28 | Discharge: 2022-03-29 | Disposition: A | Discharge status: Home / Self Care | Payer: Commercial Managed Care - HMO | Attending: Emergency Medicine | Admitting: Emergency Medicine

## 2022-03-28 DIAGNOSIS — Z79899 Other long term (current) drug therapy: Secondary | ICD-10-CM | POA: Insufficient documentation

## 2022-03-28 DIAGNOSIS — I2510 Atherosclerotic heart disease of native coronary artery without angina pectoris: Secondary | ICD-10-CM | POA: Insufficient documentation

## 2022-03-28 DIAGNOSIS — I10 Essential (primary) hypertension: Secondary | ICD-10-CM | POA: Insufficient documentation

## 2022-03-28 DIAGNOSIS — Z7982 Long term (current) use of aspirin: Secondary | ICD-10-CM | POA: Diagnosis not present

## 2022-03-28 DIAGNOSIS — Z7902 Long term (current) use of antithrombotics/antiplatelets: Secondary | ICD-10-CM | POA: Diagnosis not present

## 2022-03-28 DIAGNOSIS — K029 Dental caries, unspecified: Secondary | ICD-10-CM | POA: Insufficient documentation

## 2022-03-28 DIAGNOSIS — M542 Cervicalgia: Secondary | ICD-10-CM | POA: Diagnosis present

## 2022-03-28 DIAGNOSIS — R590 Localized enlarged lymph nodes: Secondary | ICD-10-CM | POA: Diagnosis not present

## 2022-03-28 HISTORY — DX: Atherosclerotic heart disease of native coronary artery without angina pectoris: I25.10

## 2022-03-28 HISTORY — DX: Unspecified hearing loss, left ear: H91.92

## 2022-03-28 NOTE — ED Provider Notes (Signed)
[facility] Provider Note: Georgena Spurling, MD, FACEP  CSN: [id number] MRN: [id number] ARRIVAL: 03/28/22 at 2054 ROOM: [place]  Neck Pain   HISTORY OF PRESENT ILLNESS  03/28/22 11:46 PM Todd [name] is a 62 y.o. male with 10 days of left anterior cervical lymphadenopathy.  The lymph node is painful and he rates the pain as a 6 out of 10.  It is worse with swallowing.  He denies sore throat, fever, headache, malaise.  He has been deaf in the left ear since age 3.   Past Medical History:  Diagnosis Date   CAD (coronary artery disease)    Complicated migraine    Diverticulosis    External hemorrhoids    Headache    Hypertension    Internal hemorrhoids    Palpitations    Stroke [facility])    TIA (transient ischemic attack)    Tubular adenoma of colon    Vision abnormalities     Past Surgical History:  Procedure Laterality Date   APPENDECTOMY  12/01/11   lap appy   CORONARY STENT INTERVENTION N/A 07/17/2021   Procedure: CORONARY STENT INTERVENTION;  Surgeon: Burnell Blanks, MD;  Location: [place] [facility];  Service: Cardiovascular;  Laterality: N/A;   LAPAROSCOPIC APPENDECTOMY  12/02/2011   Procedure: APPENDECTOMY LAPAROSCOPIC;  Surgeon: Madilyn Hook, DO;  Location: [facility];  Service: General;  Laterality: N/A;   LEFT HEART CATH AND CORONARY ANGIOGRAPHY N/A 07/17/2021   Procedure: LEFT HEART CATH AND CORONARY ANGIOGRAPHY;  Surgeon: Burnell Blanks, MD;  Location: [place] [facility];  Service: Cardiovascular;  Laterality: N/A;    Family History  Problem Relation Age of Onset   Hypertension Mother    Hypertension Father    Anemia Neg Hx    Arrhythmia Neg Hx    Asthma Neg Hx    Clotting disorder Neg Hx    Fainting Neg Hx    Heart attack Neg Hx    Heart disease Neg Hx    Heart failure Neg Hx    Hyperlipidemia Neg Hx    Colon cancer Neg Hx    Esophageal cancer Neg Hx    Stomach cancer Neg Hx    Rectal cancer Neg Hx     Social  History   Tobacco Use   Smoking status: Never   Smokeless tobacco: Never  Vaping Use   Vaping Use: Never used  Substance Use Topics   Alcohol use: No   Drug use: No    Prior to Admission medications   Medication Sig Start Date End Date Taking? Authorizing Provider  ALPRAZolam Duanne Moron) 0.5 MG tablet Take 1 tablet (0.5 mg total) by mouth 2 (two) times daily as needed for anxiety. Patient not taking: Reported on 11/04/2021 08/05/21   Laurin Coder, MD  aspirin EC 81 MG tablet Take 81 mg by mouth daily. Swallow whole.    [provider]  benzonatate (TESSALON) 100 MG capsule Take 1 capsule (100 mg total) by mouth every 8 (eight) hours. 02/20/22   Talbot Grumbling, FNP  clopidogrel (PLAVIX) 75 MG tablet Take 1 tablet (75 mg total) by mouth daily with breakfast. 10/10/21   Sande Rives E, PA-C  clotrimazole-betamethasone (LOTRISONE) cream Apply 1 Application topically daily. 02/06/22   Wendie Agreste, MD  hydrocortisone (ANUSOL-HC) 2.5 % rectal cream Place 1 application. rectally 2 (two) times daily. Patient not taking: Reported on 02/06/2022 07/24/21   Tonye Pearson, PA-C  hydrocortisone (ANUSOL-HC) 25  MG suppository Place 1 suppository (25 mg total) rectally 2 (two) times daily. As needed with flare. 02/06/22   Wendie Agreste, MD  metoprolol succinate (TOPROL XL) 25 MG 24 hr tablet Take 1 tablet (25 mg total) by mouth daily. 10/10/21   Darreld Mclean, PA-C  nitroGLYCERIN (NITROSTAT) 0.4 MG SL tablet Place 1 tablet (0.4 mg total) under the tongue every 5 (five) minutes as needed for chest pain. 07/12/21 11/04/21  Loel Dubonnet, NP  rosuvastatin (CRESTOR) 20 MG tablet Take 1 tablet (20 mg total) by mouth daily. 10/03/21   Hilty, Nadean Corwin, MD  senna (SENOKOT) 8.6 MG TABS tablet Take 1 tablet (8.6 mg total) by mouth in the morning and at bedtime. Patient not taking: Reported on 11/04/2021 07/23/21   Evlyn Courier, PA-C  sertraline (ZOLOFT) 25 MG tablet Take 1 tablet (25  mg total) by mouth daily. Patient not taking: Reported on 11/04/2021 08/12/21   Wendie Agreste, MD    Allergies Wellbutrin [bupropion hcl]   REVIEW OF SYSTEMS  Negative except as noted here or in the History of Present Illness.   PHYSICAL EXAMINATION  Initial Vital Signs Blood pressure 125/85, pulse 78, temperature 98 F (36.7 C), temperature source Oral, resp. rate 15, height '5\' 7"'$  (1.702 m), weight 77.1 kg, SpO2 97 %.  Examination General: Well-developed, well-nourished male in no acute distress; appearance consistent with age of record HENT: normocephalic; atraumatic; TMs normal; no pharyngeal erythema or exudate Eyes: pupils equal, round and reactive to light; extraocular muscles intact Neck: supple; left anterior cervical lymphadenopathy; no supraclavicular lymph nodes palpated Heart: regular rate and rhythm Lungs: clear to auscultation bilaterally Abdomen: soft; nondistended; nontender; bowel sounds present Extremities: No deformity; full range of motion Neurologic: Awake, alert and oriented; motor function intact in all extremities and symmetric; no facial droop Skin: Warm and dry Psychiatric: Normal mood and affect   RESULTS  Summary of this visit's results, reviewed and interpreted by myself:   EKG Interpretation  Date/Time:    Ventricular Rate:    PR Interval:    QRS Duration:   QT Interval:    QTC Calculation:   R Axis:     Text Interpretation:         Laboratory Studies: No results found for this or any previous visit (from the past 24 hour(s)). Imaging Studies: No results found.  ED COURSE and MDM  Nursing notes, initial and subsequent vitals signs, including pulse oximetry, reviewed and interpreted by myself.  Vitals:   03/28/22 2121 03/28/22 2122  BP:  125/85  Pulse:  78  Resp:  15  Temp:  98 F (36.7 C)  TempSrc:  Oral  SpO2:  97%  Weight: 77.1 kg   Height: '5\' 7"'$  (1.702 m)    Medications - No data to display    PROCEDURES   Procedures   ED DIAGNOSES  No diagnosis found.

## 2022-03-28 NOTE — ED Triage Notes (Signed)
Left sided neck lymph node pain worse with swallowing x 1 week  Denies fever or sore throat

## 2022-03-29 ENCOUNTER — Emergency Department (HOSPITAL_BASED_OUTPATIENT_CLINIC_OR_DEPARTMENT_OTHER): Payer: Commercial Managed Care - HMO

## 2022-03-29 ENCOUNTER — Encounter (HOSPITAL_BASED_OUTPATIENT_CLINIC_OR_DEPARTMENT_OTHER): Payer: Self-pay

## 2022-03-29 LAB — CBC WITH DIFFERENTIAL/PLATELET
Abs Immature Granulocytes: 0.06 10*3/uL (ref 0.00–0.07)
Basophils Absolute: 0.1 10*3/uL (ref 0.0–0.1)
Basophils Relative: 1 %
Eosinophils Absolute: 0.2 10*3/uL (ref 0.0–0.5)
Eosinophils Relative: 2 %
HCT: 45.2 % (ref 39.0–52.0)
Hemoglobin: 15.4 g/dL (ref 13.0–17.0)
Immature Granulocytes: 1 %
Lymphocytes Relative: 21 %
Lymphs Abs: 1.9 10*3/uL (ref 0.7–4.0)
MCH: 29.6 pg (ref 26.0–34.0)
MCHC: 34.1 g/dL (ref 30.0–36.0)
MCV: 86.8 fL (ref 80.0–100.0)
Monocytes Absolute: 0.8 10*3/uL (ref 0.1–1.0)
Monocytes Relative: 8 %
Neutro Abs: 6.2 10*3/uL (ref 1.7–7.7)
Neutrophils Relative %: 67 %
Platelets: 196 10*3/uL (ref 150–400)
RBC: 5.21 MIL/uL (ref 4.22–5.81)
RDW: 12.1 % (ref 11.5–15.5)
WBC: 9.1 10*3/uL (ref 4.0–10.5)
nRBC: 0 % (ref 0.0–0.2)

## 2022-03-29 LAB — BASIC METABOLIC PANEL
Anion gap: 6 (ref 5–15)
BUN: 17 mg/dL (ref 6–20)
CO2: 27 mmol/L (ref 22–32)
Calcium: 8.8 mg/dL — ABNORMAL LOW (ref 8.9–10.3)
Chloride: 102 mmol/L (ref 98–111)
Creatinine, Ser: 1.24 mg/dL (ref 0.61–1.24)
GFR, Estimated: >60 mL/min (ref >60)
Glucose, Bld: 115 mg/dL — ABNORMAL HIGH (ref 70–99)
Potassium: 4.2 mmol/L (ref 3.5–5.1)
Sodium: 135 mmol/L (ref 135–145)

## 2022-03-29 LAB — GROUP A STREP BY PCR: Group A Strep by PCR: NOT DETECTED

## 2022-03-29 LAB — MONONUCLEOSIS SCREEN: Mono Screen: NEGATIVE

## 2022-03-29 MED ORDER — AMOXICILLIN 500 MG PO CAPS: 500.0000 mg | ORAL_CAPSULE | Freq: Three times a day (TID) | ORAL | 0 refills | Status: DC

## 2022-03-29 MED ORDER — AMOXICILLIN 500 MG PO CAPS
1000.0000 mg | ORAL_CAPSULE | Freq: Once | ORAL | Status: AC
  Administered 2022-03-29: 1000 mg via ORAL
  Filled 2022-03-29: qty 2

## 2022-03-29 MED ORDER — IOHEXOL 300 MG/ML  SOLN
75.0000 mL | Freq: Once | INTRAMUSCULAR | Status: AC | PRN
  Administered 2022-03-29: 75 mL via INTRAVENOUS

## 2022-03-29 NOTE — Discharge Instructions (Signed)
The CT scan did not show any evidence of cancer but did show multiple caries (cavities) of the teeth on the left upper jaw.  Please make an appointment with your dentist for further evaluation and treatment.

## 2022-04-30 ENCOUNTER — Ambulatory Visit: Payer: 59 | Admitting: Family Medicine

## 2022-05-07 ENCOUNTER — Ambulatory Visit (INDEPENDENT_AMBULATORY_CARE_PROVIDER_SITE_OTHER): Payer: Commercial Managed Care - HMO | Admitting: Family Medicine

## 2022-05-07 ENCOUNTER — Ambulatory Visit (INDEPENDENT_AMBULATORY_CARE_PROVIDER_SITE_OTHER)
Admission: RE | Admit: 2022-05-07 | Discharge: 2022-05-07 | Disposition: A | Discharge status: Home / Self Care | Payer: Commercial Managed Care - HMO | Source: Ambulatory Visit | Attending: Family Medicine | Admitting: Family Medicine

## 2022-05-07 ENCOUNTER — Encounter: Payer: Self-pay | Admitting: Family Medicine

## 2022-05-07 VITALS — BP 118/60 | HR 68 | Temp 98.1°F | Ht 67.0 in | Wt 181.8 lb

## 2022-05-07 DIAGNOSIS — R0600 Dyspnea, unspecified: Secondary | ICD-10-CM

## 2022-05-07 DIAGNOSIS — R140 Abdominal distension (gaseous): Secondary | ICD-10-CM

## 2022-05-07 DIAGNOSIS — I2510 Atherosclerotic heart disease of native coronary artery without angina pectoris: Secondary | ICD-10-CM | POA: Diagnosis not present

## 2022-05-07 LAB — CBC
HCT: 45 % (ref 39.0–52.0)
Hemoglobin: 15.4 g/dL (ref 13.0–17.0)
MCHC: 34.2 g/dL (ref 30.0–36.0)
MCV: 88 fl (ref 78.0–100.0)
Platelets: 226 10*3/uL (ref 150.0–400.0)
RBC: 5.11 Mil/uL (ref 4.22–5.81)
RDW: 13.1 % (ref 11.5–15.5)
WBC: 6.6 10*3/uL (ref 4.0–10.5)

## 2022-05-07 LAB — LIPID PANEL
Cholesterol: 143 mg/dL (ref 0–200)
HDL: 48.2 mg/dL (ref >39.00)
LDL Cholesterol: 77 mg/dL (ref 0–99)
NonHDL: 94.39
Total CHOL/HDL Ratio: 3
Triglycerides: 89 mg/dL (ref 0.0–149.0)
VLDL: 17.8 mg/dL (ref 0.0–40.0)

## 2022-05-07 LAB — COMPREHENSIVE METABOLIC PANEL
ALT: 34 U/L (ref 0–53)
AST: 28 U/L (ref 0–37)
Albumin: 4.1 g/dL (ref 3.5–5.2)
Alkaline Phosphatase: 50 U/L (ref 39–117)
BUN: 15 mg/dL (ref 6–23)
CO2: 27 mEq/L (ref 19–32)
Calcium: 9.2 mg/dL (ref 8.4–10.5)
Chloride: 105 mEq/L (ref 96–112)
Creatinine, Ser: 0.9 mg/dL (ref 0.40–1.50)
GFR: 93.14 mL/min (ref >60.00)
Glucose, Bld: 92 mg/dL (ref 70–99)
Potassium: 4.1 mEq/L (ref 3.5–5.1)
Sodium: 139 mEq/L (ref 135–145)
Total Bilirubin: 1.6 mg/dL — ABNORMAL HIGH (ref 0.2–1.2)
Total Protein: 6.9 g/dL (ref 6.0–8.3)

## 2022-05-07 NOTE — Progress Notes (Signed)
Subjective:  Patient ID: Todd [name], male    DOB: 1960-12-02  Age: 62 y.o. MRN: [id number]  CC:  Chief Complaint  Patient presents with   Shortness of Breath    Pt notes experiences SOB on and off at times notes no correlation to exertion or movement     HPI Todd [name] presents for   Dyspnea See prior visits.  History of CAD, hospitalized last April 2023 with PTCA/DES x 1 to mid LAD with severe mid LAD stenosis.  Treated with dual antiplatelet therapy with aspirin, Plavix through April of this year, and beta-blocker and statin.  Multiple emergency room evaluations since his stenting procedure last year for chest symptoms, and has ruled out multiple times, thought to have possible chest wall pain, also possible anxiety component.  Also discussed dyspnea on previous visits, also discussed with cardiology and pulmonary.  No significant lung disease noted.  Possible component of physical deconditioning, anxiety, Xanax was prescribed by pulmonary in May 2023.  Plan for pulmonary function testing, then pulmonary follow-up.   Did not complete PFTs, no recent pulmonary follow up.  Last CXR in 07/2021. No active cardiopulmonary disease.  Last visit with cardiology August 14, recommended lipid panel in 3 months and then office visit this April.  Has not completed lipid panel.  Today - notes that shortness of breath form last year had resolved, then recently restarted past few months. Not every day. 1-2 times per week.  Noted this morning when woke up.  No chest pain, no palpitations.  No cough.  Some dyspnea with walking at times, other times not dyspneic. Feels like needs to take deep breath - sometimes has to do 2-3 times.  Some stress at times, but denies feeling stressed, anxious, or depression. Last took xanax 4-5 months ago.   Abdominal bloating Past 3 months. Just occasional belching/burping. More flatus past few months.  BM every other day. Soft stool. No stool changes, no blood in  stool.  No nausea/vomiting.      History Patient Active Problem List   Diagnosis Date Noted   NSTEMI (non-ST elevated myocardial infarction) ([place]) 07/20/2021   CAD (coronary artery disease) 07/18/2021   Hyperlipidemia 07/18/2021   Unstable angina ([facility])    Shortness of breath 11/12/2016   Chest pain 11/12/2016   Heart palpitations 10/20/2013   Situational anxiety 10/20/2013   Migraine with visual aura 08/24/2013   TIA (transient ischemic attack) 08/23/2013   Blurred vision 08/23/2013   Anomia 08/23/2013   Past Medical History:  Diagnosis Date   CAD (coronary artery disease)    Complicated migraine    Deafness in left ear    Diverticulosis    External hemorrhoids    Headache    Hypertension    Internal hemorrhoids    Palpitations    Stroke ([place])    TIA (transient ischemic attack)    Tubular adenoma of colon    Vision abnormalities    Past Surgical History:  Procedure Laterality Date   APPENDECTOMY  12/01/11   lap appy   CORONARY STENT INTERVENTION N/A 07/17/2021   Procedure: CORONARY STENT INTERVENTION;  Surgeon: Burnell Blanks, MD;  Location: [facility];  Service: Cardiovascular;  Laterality: N/A;   LAPAROSCOPIC APPENDECTOMY  12/02/2011   Procedure: APPENDECTOMY LAPAROSCOPIC;  Surgeon: Madilyn Hook, DO;  Location: [facility];  Service: General;  Laterality: N/A;   LEFT HEART CATH AND CORONARY ANGIOGRAPHY N/A 07/17/2021   Procedure: LEFT HEART CATH AND CORONARY ANGIOGRAPHY;  Surgeon:  Burnell Blanks, MD;  Location: [facility];  Service: Cardiovascular;  Laterality: N/A;   Allergies  Allergen Reactions   Wellbutrin [Bupropion Hcl] Itching and Rash   Prior to Admission medications   Medication Sig Start Date End Date Taking? Authorizing Provider  aspirin EC 81 MG tablet Take 81 mg by mouth daily. Swallow whole.   Yes [provider]  clopidogrel (PLAVIX) 75 MG tablet Take 1 tablet (75 mg total) by mouth daily with breakfast. 10/10/21   Yes Sande Rives E, PA-C  clotrimazole-betamethasone (LOTRISONE) cream Apply 1 Application topically daily. 02/06/22  Yes Wendie Agreste, MD  hydrocortisone (ANUSOL-HC) 25 MG suppository Place 1 suppository (25 mg total) rectally 2 (two) times daily. As needed with flare. 02/06/22  Yes Wendie Agreste, MD  metoprolol succinate (TOPROL XL) 25 MG 24 hr tablet Take 1 tablet (25 mg total) by mouth daily. 10/10/21  Yes Sande Rives E, PA-C  rosuvastatin (CRESTOR) 20 MG tablet Take 1 tablet (20 mg total) by mouth daily. 10/03/21  Yes Hilty, Nadean Corwin, MD  amoxicillin (AMOXIL) 500 MG capsule Take 1 capsule (500 mg total) by mouth 3 (three) times daily. Patient not taking: Reported on 05/07/2022 03/29/22   Molpus, Jenny Reichmann, MD  benzonatate (TESSALON) 100 MG capsule Take 1 capsule (100 mg total) by mouth every 8 (eight) hours. Patient not taking: Reported on 05/07/2022 02/20/22   Talbot Grumbling, FNP  nitroGLYCERIN (NITROSTAT) 0.4 MG SL tablet Place 1 tablet (0.4 mg total) under the tongue every 5 (five) minutes as needed for chest pain. 07/12/21 11/04/21  Loel Dubonnet, NP   Social History   Socioeconomic History   Marital status: Divorced    Spouse name: Not on file   Number of children: Not on file   Years of education: Not on file   Highest education level: Not on file  Occupational History   Occupation: part time  Tobacco Use   Smoking status: Never   Smokeless tobacco: Never  Vaping Use   Vaping Use: Never used  Substance and Sexual Activity   Alcohol use: No   Drug use: No   Sexual activity: Not on file  Other Topics Concern   Not on file  Social History Narrative   Lives alone   Right Handed   Drinks very little caffeine   Social Determinants of Health   Financial Resource Strain: Not on file  Food Insecurity: Not on file  Transportation Needs: Not on file  Physical Activity: Not on file  Stress: Not on file  Social Connections: Not on file  Intimate Partner  Violence: Not on file   Review of Systems Per HPI.   Objective:   Vitals:   05/07/22 1056  BP: 118/60  Pulse: 68  Temp: 98.1 F (36.7 C)  TempSrc: Temporal  SpO2: 99%  Weight: 181 lb 12.8 oz (82.5 kg)  Height: 5' 7"$  (1.702 m)     Physical Exam Vitals reviewed.  Constitutional:      Appearance: He is well-developed.  HENT:     Head: Normocephalic and atraumatic.  Neck:     Vascular: No carotid bruit or JVD.  Cardiovascular:     Rate and Rhythm: Normal rate and regular rhythm.     Heart sounds: Normal heart sounds. No murmur heard. Pulmonary:     Effort: Pulmonary effort is normal.     Breath sounds: No rales.     Comments: end expiratory coarse breath sounds, distant in the right  upper lobe.  Normal effort, no distress, no wheeze.  Otherwise clear. Abdominal:     General: Abdomen is flat. There is no distension.     Palpations: Abdomen is soft.     Tenderness: There is no abdominal tenderness.  Musculoskeletal:     Right lower leg: No edema.     Left lower leg: No edema.  Skin:    General: Skin is warm and dry.  Neurological:     Mental Status: He is alert and oriented to person, place, and time.  Psychiatric:        Mood and Affect: Mood normal.      EKG, sinus rhythm, rate 63, no acute ST or T wave changes or significant changes when compared to 07/29/2021 EKG  Assessment & Plan:  Todd [name] is a 62 y.o. male . Dyspnea, unspecified type - Plan: CBC, DG Chest 2 View, EKG 12-Lead  -Intermittent symptoms of feeling like he needs to take a deep breath or 2.  Episodic dyspnea with activity, but not every time.  Atypical.  Denies chest pain or anginal symptoms.  No acute findings or changes on EKG.  Question coarse breath sounds on the right upper lobe, otherwise clear.  -Check chest x-ray, stressed importance of pulmonary follow-up, especially now that symptoms have returned.  May still need to proceed with PFTs.  Phone number provided for pulmonary.  RTC  precautions.  Bloating symptom  -Reassuring exam.  Notes increased flatus, low FODMAP foods recommended initially and handout given on bloating.  RTC precautions if persistent after those changes.  Coronary artery disease involving native heart without angina pectoris, unspecified vessel or lesion type - Plan: Comprehensive metabolic panel, Lipid panel, DG Chest 2 View, EKG 12-Lead  -Check lipids as planned recheck in few months at his August cardiology visit.  Continue follow-up with cardiology as planned.  No concerns on EKG identified at this time.  RTC/ER precautions given.  No orders of the defined types were placed in this encounter.  Patient Instructions  See foods that can cause more gas/bloating and try to avoid those for now. If bloating not improved in the next few weeks, return to discuss further.    Please call lung doctor, Dr. Gala Murdoch [phone] to follow-up and decide if that testing is needed and to discuss the shortness of breath.  Please have x-ray performed at the imaging center below. Return to the clinic or go to the nearest emergency room if any of your symptoms worsen or new symptoms occur.  [facility] for xray.  Walk in 8:30-4:30 during weekdays, no appointment needed Todd [name].  [place], [place] [address]   Your heart doctor also ordered a cholesterol test for you to have done a few months ago.  I will add that to your blood work today, but please follow-up with your specialists as planned.  Recheck with me in 1 month for a physical.  That will be a preventative visit.  If there are any acute concerns or other concerns, please schedule a separate visit and I am happy to review those further.  Take care!    Low-FODMAP Eating Plan  FODMAP stands for fermentable oligosaccharides, disaccharides, monosaccharides, and polyols. These are sugars that are hard for some people to digest. A low-FODMAP eating plan may help some people who have irritable bowel syndrome  (IBS) and certain other bowel (intestinal) diseases to manage their symptoms. This meal plan can be complicated to follow. Work with a diet and nutrition  specialist (dietitian) to make a low-FODMAP eating plan that is right for you. A dietitian can help make sure that you get enough nutrition from this diet. What are tips for following this plan? Reading food labels Check labels for hidden FODMAPs such as: High-fructose syrup. Honey. Agave. Natural fruit flavors. Onion or garlic powder. Choose low-FODMAP foods that contain 3-4 grams of fiber per serving. Check food labels for serving sizes. Eat only one serving at a time to make sure FODMAP levels stay low. Shopping Shop with a list of foods that are recommended on this diet and make a meal plan. Meal planning Follow a low-FODMAP eating plan for up to 6 weeks, or as told by your health care provider or dietitian. To follow the eating plan: Eliminate high-FODMAP foods from your diet completely. Choose only low-FODMAP foods to eat. You will do this for 2-6 weeks. Gradually reintroduce high-FODMAP foods into your diet one at a time. Most people should wait a few days before introducing the next new high-FODMAP food into their meal plan. Your dietitian can recommend how quickly you may reintroduce foods. Keep a daily record of what and how much you eat and drink. Make note of any symptoms that you have after eating. Review your daily record with a dietitian regularly to identify which foods you can eat and which foods you should avoid. General tips Drink enough fluid each day to keep your urine pale yellow. Avoid processed foods. These often have added sugar and may be high in FODMAPs. Avoid most dairy products, whole grains, and sweeteners. Work with a dietitian to make sure you get enough fiber in your diet. Avoid high FODMAP foods at meals to manage symptoms. Recommended foods Fruits Bananas, oranges, tangerines, lemons, limes,  blueberries, raspberries, strawberries, grapes, cantaloupe, honeydew melon, kiwi, papaya, passion fruit, and pineapple. Limited amounts of dried cranberries, banana chips, and shredded coconut. Vegetables Eggplant, zucchini, cucumber, peppers, green beans, bean sprouts, lettuce, arugula, kale, Swiss chard, spinach, collard greens, bok choy, summer squash, potato, and tomato. Limited amounts of corn, carrot, and sweet potato. Green parts of scallions. Grains Gluten-free grains, such as rice, oats, buckwheat, quinoa, corn, polenta, and millet. Gluten-free pasta, bread, or cereal. Rice noodles. Corn tortillas. Meats and other proteins Unseasoned beef, pork, poultry, or fish. Eggs. Todd [name]. Tofu (firm) and tempeh. Limited amounts of nuts and seeds, such as almonds, walnuts, [place] nuts, pecans, peanuts, nut butters, pumpkin seeds, chia seeds, and sunflower seeds. Dairy Lactose-free milk, yogurt, and kefir. Lactose-free cottage cheese and ice cream. Non-dairy milks, such as almond, coconut, hemp, and rice milk. Non-dairy yogurt. Limited amounts of goat cheese, brie, mozzarella, parmesan, swiss, and other hard cheeses. Fats and oils Butter-free spreads. Vegetable oils, such as olive, canola, and sunflower oil. Seasoning and other foods Artificial sweeteners with names that do not end in "ol," such as aspartame, saccharine, and stevia. Maple syrup, white table sugar, raw sugar, brown sugar, and molasses. Mayonnaise, soy sauce, and tamari. Fresh basil, coriander, parsley, rosemary, and thyme. Beverages Water and mineral water. Sugar-sweetened soft drinks. Small amounts of orange juice or cranberry juice. Black and green tea. Most dry wines. Coffee. The items listed above may not be a complete list of foods and beverages you can eat. Contact a dietitian for more information. Foods to avoid Fruits Fresh, dried, and juiced forms of apple, pear, watermelon, peach, plum, cherries, apricots, blackberries,  boysenberries, figs, nectarines, and mango. Avocado. Vegetables Chicory root, artichoke, asparagus, cabbage, snow peas, Brussels sprouts, broccoli, sugar snap  peas, mushrooms, celery, and cauliflower. Onions, garlic, leeks, and the white part of scallions. Grains Wheat, including kamut, durum, and semolina. Barley and bulgur. Couscous. Wheat-based cereals. Wheat noodles, bread, crackers, and pastries. Meats and other proteins Fried or fatty meat. Sausage. Cashews and pistachios. Soybeans, baked beans, black beans, chickpeas, kidney beans, fava beans, navy beans, lentils, black-eyed peas, and split peas. Dairy Milk, yogurt, ice cream, and soft cheese. Cream and sour cream. Milk-based sauces. Custard. Buttermilk. Soy milk. Seasoning and other foods Any sugar-free gum or candy. Foods that contain artificial sweeteners such as sorbitol, mannitol, isomalt, or xylitol. Foods that contain honey, high-fructose corn syrup, or agave. Bouillon, vegetable stock, beef stock, and chicken stock. Garlic and onion powder. Condiments made with onion, such as hummus, chutney, pickles, relish, salad dressing, and salsa. Tomato paste. Beverages Chicory-based drinks. Coffee substitutes. Chamomile tea. Fennel tea. Sweet or fortified wines such as port or sherry. Diet soft drinks made with isomalt, mannitol, maltitol, sorbitol, or xylitol. Apple, pear, and mango juice. Juices with high-fructose corn syrup. The items listed above may not be a complete list of foods and beverages you should avoid. Contact a dietitian for more information. Summary FODMAP stands for fermentable oligosaccharides, disaccharides, monosaccharides, and polyols. These are sugars that are hard for some people to digest. A low-FODMAP eating plan is a short-term diet that helps to ease symptoms of certain bowel diseases. The eating plan usually lasts up to 6 weeks. After that, high-FODMAP foods are reintroduced gradually and one at a time. This can  help you find out which foods may be causing symptoms. A low-FODMAP eating plan can be complicated. It is best to work with a dietitian who has experience with this type of plan. This information is not intended to replace advice given to you by your health care provider. Make sure you discuss any questions you have with your health care provider. Document Revised: 07/28/2019 Document Reviewed: 07/28/2019 Elsevier Patient Education  Todd [name].   Abdominal Bloating When you have abdominal bloating, your abdomen may feel full, tight, or painful. It may also look bigger than normal or swollen (distended). Common causes of abdominal bloating include: Swallowing air. Constipation. Problems digesting food. Eating too much. Irritable bowel syndrome. This is a condition that affects the large intestine. Lactose intolerance. This is an inability to digest lactose, a natural sugar in dairy products. Celiac disease. This is a condition that affects the ability to digest gluten, a protein found in some grains. Gastroparesis. This is a condition that slows down the movement of food in the stomach and small intestine. It is more common in people with diabetes mellitus. Gastroesophageal reflux disease (GERD). This is a condition that makes stomach acid flow back into the esophagus. Urinary retention. This means that the body is holding onto urine, and the bladder cannot be emptied all the way. Follow these instructions at home: Eating and drinking Avoid eating too much. Try not to swallow air while talking or eating. Avoid eating while lying down. Avoid these foods and drinks: Foods that cause gas, such as broccoli, cabbage, cauliflower, and baked beans. Carbonated drinks. Hard candy. Chewing gum. Medicines Take over-the-counter and prescription medicines only as told by your health care provider. Take probiotic medicines. These medicines contain live bacteria or yeasts that can help  digestion. Take coated peppermint oil capsules. General instructions Try to exercise regularly. Exercise may help to relieve bloating that is caused by gas and relieve constipation. Keep all follow-up visits. This is important.  Contact a health care provider if: You have nausea and vomiting. You have diarrhea. You have abdominal pain. You have unusual weight loss or weight gain. You have severe pain, and medicines do not help. Get help right away if: You have chest pain. You have trouble breathing. You have shortness of breath. You have trouble urinating. You have darker urine than normal. You have blood in your stools or have dark, tarry stools. These symptoms may represent a serious problem that is an emergency. Do not wait to see if the symptoms will go away. Get medical help right away. Call your local emergency services (911 in the [place] Do not drive yourself to the hospital. Summary Abdominal bloating means that the abdomen is swollen. Common causes of abdominal bloating are swallowing air, constipation, and problems digesting food. Avoid eating too much and avoid swallowing air. Avoid foods that cause gas, carbonated drinks, hard candy, and chewing gum. This information is not intended to replace advice given to you by your health care provider. Make sure you discuss any questions you have with your health care provider. Document Revised: 10/11/2019 Document Reviewed: 10/11/2019 Elsevier Patient Education  [place],   Merri Ray, MD [place] 05/07/22 12:12 PM

## 2022-05-07 NOTE — Patient Instructions (Addendum)
See foods that can cause more gas/bloating and try to avoid those for now. If bloating not improved in the next few weeks, return to discuss further.    Please call lung doctor, Dr. Gala Murdoch [phone] to follow-up and decide if that testing is needed and to discuss the shortness of breath.  Please have x-ray performed at the imaging center below. Return to the clinic or go to the nearest emergency room if any of your symptoms worsen or new symptoms occur.  [facility] for xray.  Walk in 8:30-4:30 during weekdays, no appointment needed [employer].  [place], [place] [address]   Your heart doctor also ordered a cholesterol test for you to have done a few months ago.  I will add that to your blood work today, but please follow-up with your specialists as planned.  Recheck with me in 1 month for a physical.  That will be a preventative visit.  If there are any acute concerns or other concerns, please schedule a separate visit and I am happy to review those further.  Take care!    Low-FODMAP Eating Plan  FODMAP stands for fermentable oligosaccharides, disaccharides, monosaccharides, and polyols. These are sugars that are hard for some people to digest. A low-FODMAP eating plan may help some people who have irritable bowel syndrome (IBS) and certain other bowel (intestinal) diseases to manage their symptoms. This meal plan can be complicated to follow. Work with a diet and nutrition specialist (dietitian) to make a low-FODMAP eating plan that is right for you. A dietitian can help make sure that you get enough nutrition from this diet. What are tips for following this plan? Reading food labels Check labels for hidden FODMAPs such as: High-fructose syrup. Honey. Agave. Natural fruit flavors. Onion or garlic powder. Choose low-FODMAP foods that contain 3-4 grams of fiber per serving. Check food labels for serving sizes. Eat only one serving at a time to make sure FODMAP levels stay  low. [employer] with a list of foods that are recommended on this diet and make a meal plan. Meal planning Follow a low-FODMAP eating plan for up to 6 weeks, or as told by your health care provider or dietitian. To follow the eating plan: Eliminate high-FODMAP foods from your diet completely. Choose only low-FODMAP foods to eat. You will do this for 2-6 weeks. Gradually reintroduce high-FODMAP foods into your diet one at a time. Most people should wait a few days before introducing the next new high-FODMAP food into their meal plan. Your dietitian can recommend how quickly you may reintroduce foods. Keep a daily record of what and how much you eat and drink. Make note of any symptoms that you have after eating. Review your daily record with a dietitian regularly to identify which foods you can eat and which foods you should avoid. General tips Drink enough fluid each day to keep your urine pale yellow. Avoid processed foods. These often have added sugar and may be high in FODMAPs. Avoid most dairy products, whole grains, and sweeteners. Work with a dietitian to make sure you get enough fiber in your diet. Avoid high FODMAP foods at meals to manage symptoms. Recommended foods Fruits Bananas, oranges, tangerines, lemons, limes, blueberries, raspberries, strawberries, grapes, cantaloupe, honeydew melon, kiwi, papaya, passion fruit, and pineapple. Limited amounts of dried cranberries, banana chips, and shredded coconut. Vegetables Eggplant, zucchini, cucumber, peppers, green beans, bean sprouts, lettuce, arugula, kale, Swiss chard, spinach, collard greens, bok choy, summer squash, potato, and tomato. Limited  amounts of corn, carrot, and sweet potato. Green parts of scallions. Grains Gluten-free grains, such as rice, oats, buckwheat, quinoa, corn, polenta, and millet. Gluten-free pasta, bread, or cereal. Rice noodles. Corn tortillas. Meats and other proteins Unseasoned beef, pork, poultry, or  fish. Eggs. Berniece Salines. Tofu (firm) and tempeh. Limited amounts of nuts and seeds, such as almonds, walnuts, [place] nuts, pecans, peanuts, nut butters, pumpkin seeds, chia seeds, and sunflower seeds. Dairy Lactose-free milk, yogurt, and kefir. Lactose-free cottage cheese and ice cream. Non-dairy milks, such as almond, coconut, hemp, and rice milk. Non-dairy yogurt. Limited amounts of goat cheese, brie, mozzarella, parmesan, swiss, and other hard cheeses. Fats and oils Butter-free spreads. Vegetable oils, such as olive, canola, and sunflower oil. Seasoning and other foods Artificial sweeteners with names that do not end in "ol," such as aspartame, saccharine, and stevia. Maple syrup, white table sugar, raw sugar, brown sugar, and molasses. Mayonnaise, soy sauce, and tamari. Fresh basil, coriander, parsley, rosemary, and thyme. Beverages Water and mineral water. Sugar-sweetened soft drinks. Small amounts of orange juice or cranberry juice. Black and green tea. Most dry wines. Coffee. The items listed above may not be a complete list of foods and beverages you can eat. Contact a dietitian for more information. Foods to avoid Fruits Fresh, dried, and juiced forms of apple, pear, watermelon, peach, plum, cherries, apricots, blackberries, boysenberries, figs, nectarines, and mango. Avocado. Vegetables Chicory root, artichoke, asparagus, cabbage, snow peas, Brussels sprouts, broccoli, sugar snap peas, mushrooms, celery, and cauliflower. Onions, garlic, leeks, and the white part of scallions. Grains Wheat, including kamut, durum, and semolina. Barley and bulgur. Couscous. Wheat-based cereals. Wheat noodles, bread, crackers, and pastries. Meats and other proteins Fried or fatty meat. Sausage. Cashews and pistachios. Soybeans, baked beans, black beans, chickpeas, kidney beans, fava beans, navy beans, lentils, black-eyed peas, and split peas. Dairy Milk, yogurt, ice cream, and soft cheese. Cream and sour cream.  Milk-based sauces. Custard. Buttermilk. Soy milk. Seasoning and other foods Any sugar-free gum or candy. Foods that contain artificial sweeteners such as sorbitol, mannitol, isomalt, or xylitol. Foods that contain honey, high-fructose corn syrup, or agave. Bouillon, vegetable stock, beef stock, and chicken stock. Garlic and onion powder. Condiments made with onion, such as hummus, chutney, pickles, relish, salad dressing, and salsa. Tomato paste. Beverages Chicory-based drinks. Coffee substitutes. Chamomile tea. Fennel tea. Sweet or fortified wines such as port or sherry. Diet soft drinks made with isomalt, mannitol, maltitol, sorbitol, or xylitol. Apple, pear, and mango juice. Juices with high-fructose corn syrup. The items listed above may not be a complete list of foods and beverages you should avoid. Contact a dietitian for more information. Summary FODMAP stands for fermentable oligosaccharides, disaccharides, monosaccharides, and polyols. These are sugars that are hard for some people to digest. A low-FODMAP eating plan is a short-term diet that helps to ease symptoms of certain bowel diseases. The eating plan usually lasts up to 6 weeks. After that, high-FODMAP foods are reintroduced gradually and one at a time. This can help you find out which foods may be causing symptoms. A low-FODMAP eating plan can be complicated. It is best to work with a dietitian who has experience with this type of plan. This information is not intended to replace advice given to you by your health care provider. Make sure you discuss any questions you have with your health care provider. Document Revised: 07/28/2019 Document Reviewed: 07/28/2019 Elsevier Patient Education  [place].   Abdominal Bloating When you have abdominal bloating, your abdomen  may feel full, tight, or painful. It may also look bigger than normal or swollen (distended). Common causes of abdominal bloating include: Swallowing  air. Constipation. Problems digesting food. Eating too much. Irritable bowel syndrome. This is a condition that affects the large intestine. Lactose intolerance. This is an inability to digest lactose, a natural sugar in dairy products. Celiac disease. This is a condition that affects the ability to digest gluten, a protein found in some grains. Gastroparesis. This is a condition that slows down the movement of food in the stomach and small intestine. It is more common in people with diabetes mellitus. Gastroesophageal reflux disease (GERD). This is a condition that makes stomach acid flow back into the esophagus. Urinary retention. This means that the body is holding onto urine, and the bladder cannot be emptied all the way. Follow these instructions at home: Eating and drinking Avoid eating too much. Try not to swallow air while talking or eating. Avoid eating while lying down. Avoid these foods and drinks: Foods that cause gas, such as broccoli, cabbage, cauliflower, and baked beans. Carbonated drinks. Hard candy. Chewing gum. Medicines Take over-the-counter and prescription medicines only as told by your health care provider. Take probiotic medicines. These medicines contain live bacteria or yeasts that can help digestion. Take coated peppermint oil capsules. General instructions Try to exercise regularly. Exercise may help to relieve bloating that is caused by gas and relieve constipation. Keep all follow-up visits. This is important. Contact a health care provider if: You have nausea and vomiting. You have diarrhea. You have abdominal pain. You have unusual weight loss or weight gain. You have severe pain, and medicines do not help. Get help right away if: You have chest pain. You have trouble breathing. You have shortness of breath. You have trouble urinating. You have darker urine than normal. You have blood in your stools or have dark, tarry stools. These symptoms may  represent a serious problem that is an emergency. Do not wait to see if the symptoms will go away. Get medical help right away. Call your local emergency services (911 in the [place] Do not drive yourself to the hospital. Summary Abdominal bloating means that the abdomen is swollen. Common causes of abdominal bloating are swallowing air, constipation, and problems digesting food. Avoid eating too much and avoid swallowing air. Avoid foods that cause gas, carbonated drinks, hard candy, and chewing gum. This information is not intended to replace advice given to you by your health care provider. Make sure you discuss any questions you have with your health care provider. Document Revised: 10/11/2019 Document Reviewed: 10/11/2019 Elsevier Patient Education  [place].

## 2022-05-19 ENCOUNTER — Emergency Department (HOSPITAL_COMMUNITY): Payer: Commercial Managed Care - HMO

## 2022-05-19 ENCOUNTER — Emergency Department (HOSPITAL_COMMUNITY)
Admission: EM | Admit: 2022-05-19 | Discharge: 2022-05-19 | Disposition: A | Discharge status: Home / Self Care | Payer: Commercial Managed Care - HMO | Attending: Emergency Medicine | Admitting: Emergency Medicine

## 2022-05-19 DIAGNOSIS — R55 Syncope and collapse: Secondary | ICD-10-CM | POA: Insufficient documentation

## 2022-05-19 DIAGNOSIS — Z8673 Personal history of transient ischemic attack (TIA), and cerebral infarction without residual deficits: Secondary | ICD-10-CM | POA: Diagnosis not present

## 2022-05-19 DIAGNOSIS — R197 Diarrhea, unspecified: Secondary | ICD-10-CM | POA: Insufficient documentation

## 2022-05-19 DIAGNOSIS — R1010 Upper abdominal pain, unspecified: Secondary | ICD-10-CM | POA: Diagnosis not present

## 2022-05-19 DIAGNOSIS — I10 Essential (primary) hypertension: Secondary | ICD-10-CM | POA: Insufficient documentation

## 2022-05-19 DIAGNOSIS — Z7902 Long term (current) use of antithrombotics/antiplatelets: Secondary | ICD-10-CM | POA: Insufficient documentation

## 2022-05-19 DIAGNOSIS — Z79899 Other long term (current) drug therapy: Secondary | ICD-10-CM | POA: Diagnosis not present

## 2022-05-19 DIAGNOSIS — Z1152 Encounter for screening for COVID-19: Secondary | ICD-10-CM | POA: Diagnosis not present

## 2022-05-19 DIAGNOSIS — Z7982 Long term (current) use of aspirin: Secondary | ICD-10-CM | POA: Diagnosis not present

## 2022-05-19 DIAGNOSIS — D72829 Elevated white blood cell count, unspecified: Secondary | ICD-10-CM | POA: Insufficient documentation

## 2022-05-19 DIAGNOSIS — I2510 Atherosclerotic heart disease of native coronary artery without angina pectoris: Secondary | ICD-10-CM | POA: Insufficient documentation

## 2022-05-19 DIAGNOSIS — R110 Nausea: Secondary | ICD-10-CM | POA: Diagnosis present

## 2022-05-19 LAB — COMPREHENSIVE METABOLIC PANEL
ALT: 21 U/L (ref 0–44)
AST: 24 U/L (ref 15–41)
Albumin: 3.2 g/dL — ABNORMAL LOW (ref 3.5–5.0)
Alkaline Phosphatase: 40 U/L (ref 38–126)
Anion gap: 8 (ref 5–15)
BUN: 20 mg/dL (ref 6–20)
CO2: 23 mmol/L (ref 22–32)
Calcium: 7.4 mg/dL — ABNORMAL LOW (ref 8.9–10.3)
Chloride: 110 mmol/L (ref 98–111)
Creatinine, Ser: 1 mg/dL (ref 0.61–1.24)
GFR, Estimated: >60 mL/min (ref >60)
Glucose, Bld: 101 mg/dL — ABNORMAL HIGH (ref 70–99)
Potassium: 3.4 mmol/L — ABNORMAL LOW (ref 3.5–5.1)
Sodium: 141 mmol/L (ref 135–145)
Total Bilirubin: 1.3 mg/dL — ABNORMAL HIGH (ref 0.3–1.2)
Total Protein: 5.9 g/dL — ABNORMAL LOW (ref 6.5–8.1)

## 2022-05-19 LAB — CBC WITH DIFFERENTIAL/PLATELET
Abs Immature Granulocytes: 0.05 10*3/uL (ref 0.00–0.07)
Basophils Absolute: 0 10*3/uL (ref 0.0–0.1)
Basophils Relative: 0 %
Eosinophils Absolute: 0.1 10*3/uL (ref 0.0–0.5)
Eosinophils Relative: 1 %
HCT: 47.1 % (ref 39.0–52.0)
Hemoglobin: 15.7 g/dL (ref 13.0–17.0)
Immature Granulocytes: 0 %
Lymphocytes Relative: 6 %
Lymphs Abs: 0.8 10*3/uL (ref 0.7–4.0)
MCH: 29.3 pg (ref 26.0–34.0)
MCHC: 33.3 g/dL (ref 30.0–36.0)
MCV: 87.9 fL (ref 80.0–100.0)
Monocytes Absolute: 0.9 10*3/uL (ref 0.1–1.0)
Monocytes Relative: 7 %
Neutro Abs: 10.9 10*3/uL — ABNORMAL HIGH (ref 1.7–7.7)
Neutrophils Relative %: 86 %
Platelets: 233 10*3/uL (ref 150–400)
RBC: 5.36 MIL/uL (ref 4.22–5.81)
RDW: 12.3 % (ref 11.5–15.5)
WBC: 12.9 10*3/uL — ABNORMAL HIGH (ref 4.0–10.5)
nRBC: 0 % (ref 0.0–0.2)

## 2022-05-19 LAB — URINALYSIS, ROUTINE W REFLEX MICROSCOPIC
Bilirubin Urine: NEGATIVE
Glucose, UA: NEGATIVE mg/dL
Hgb urine dipstick: NEGATIVE
Ketones, ur: NEGATIVE mg/dL
Leukocytes,Ua: NEGATIVE
Nitrite: NEGATIVE
Protein, ur: NEGATIVE mg/dL
Specific Gravity, Urine: 1.044 — ABNORMAL HIGH (ref 1.005–1.030)
pH: 6 (ref 5.0–8.0)

## 2022-05-19 LAB — TROPONIN I (HIGH SENSITIVITY)
Troponin I (High Sensitivity): 3 ng/L (ref <18)
Troponin I (High Sensitivity): 3 ng/L (ref <18)

## 2022-05-19 LAB — RESP PANEL BY RT-PCR (RSV, FLU A&B, COVID)  RVPGX2
Influenza A by PCR: NEGATIVE
Influenza B by PCR: NEGATIVE
Resp Syncytial Virus by PCR: NEGATIVE
SARS Coronavirus 2 by RT PCR: NEGATIVE

## 2022-05-19 LAB — CBG MONITORING, ED: Glucose-Capillary: 106 mg/dL — ABNORMAL HIGH (ref 70–99)

## 2022-05-19 LAB — LIPASE, BLOOD: Lipase: 38 U/L (ref 11–51)

## 2022-05-19 MED ORDER — SODIUM CHLORIDE 0.9 % IV BOLUS
1000.0000 mL | Freq: Once | INTRAVENOUS | Status: AC
  Administered 2022-05-19: 1000 mL via INTRAVENOUS

## 2022-05-19 MED ORDER — SODIUM CHLORIDE (PF) 0.9 % IJ SOLN
INTRAMUSCULAR | Status: AC
  Filled 2022-05-19: qty 50

## 2022-05-19 MED ORDER — IOHEXOL 300 MG/ML  SOLN
100.0000 mL | Freq: Once | INTRAMUSCULAR | Status: AC | PRN
  Administered 2022-05-19: 100 mL via INTRAVENOUS

## 2022-05-19 NOTE — Discharge Instructions (Signed)
Follow-up with your primary care provider.  Return to the emergency room for worsening or concerning symptoms.  Home to rest and hydrate.  Recommend bland diet until diarrhea resolves.

## 2022-05-19 NOTE — ED Triage Notes (Signed)
Patient BIB EMS for syncopal episode. Has been sick with N/V/D and had episode on the way to the bathroom. Denies any injury. Complaining of abdominal pain. CBG 102.

## 2022-05-19 NOTE — ED Provider Notes (Signed)
[facility] Provider Note   CSN: [id number] Arrival date & time: 05/19/22  [id number]     History  No chief complaint on file.   Todd [name] is a 62 y.o. male.  62 year old male brought in by EMS from home with abdominal pain and syncopal episode. Patient last ate around midnight, went to bed at 2am, woke up at 5am with upper abdominal pain, nausea. Patient went to the bathroom, had diarrhea. Then developed intense head pressure and called 911. While on the phone with 911, passed out, woke up lying on the floor to the dispatcher calling him back. States at this time, headache has resolved, states he did not hit his head and does not have any injuries (is on Plavix and ASA). Reports upper abdominal pain, states he has been having upper abdominal bloating after eating for a few months, was seen at PCP office about a week ago for same and has tried to make dietary changes. Denies CP, SHOB, unilateral weakness or numbness.        Home Medications Prior to Admission medications   Medication Sig Start Date End Date Taking? Authorizing Provider  aspirin EC 81 MG tablet Take 81 mg by mouth daily. Swallow whole.    [provider]  clopidogrel (PLAVIX) 75 MG tablet Take 1 tablet (75 mg total) by mouth daily with breakfast. 10/10/21   Sande Rives E, PA-C  clotrimazole-betamethasone (LOTRISONE) cream Apply 1 Application topically daily. 02/06/22   Wendie Agreste, MD  hydrocortisone (ANUSOL-HC) 25 MG suppository Place 1 suppository (25 mg total) rectally 2 (two) times daily. As needed with flare. 02/06/22   Wendie Agreste, MD  metoprolol succinate (TOPROL XL) 25 MG 24 hr tablet Take 1 tablet (25 mg total) by mouth daily. 10/10/21   Darreld Mclean, PA-C  nitroGLYCERIN (NITROSTAT) 0.4 MG SL tablet Place 1 tablet (0.4 mg total) under the tongue every 5 (five) minutes as needed for chest pain. 07/12/21 11/04/21  Loel Dubonnet, NP   rosuvastatin (CRESTOR) 20 MG tablet Take 1 tablet (20 mg total) by mouth daily. 10/03/21   Hilty, Nadean Corwin, MD      Allergies    Wellbutrin [bupropion hcl]    Review of Systems   Review of Systems Negative excpet as per HPI Physical Exam Updated Vital Signs BP 113/69   Pulse 73   Temp 98.1 F (36.7 C) (Oral)   Resp 18   Ht '5\' 7"'$  (1.702 m)   Wt 82 kg   SpO2 97%   BMI 28.31 kg/m  Physical Exam Vitals and nursing note reviewed.  Constitutional:      General: He is not in acute distress.    Appearance: He is well-developed. He is not diaphoretic.  HENT:     Head: Normocephalic and atraumatic.     Mouth/Throat:     Mouth: Mucous membranes are moist.  Eyes:     Conjunctiva/sclera: Conjunctivae normal.  Cardiovascular:     Rate and Rhythm: Normal rate and regular rhythm.     Heart sounds: Normal heart sounds.  Pulmonary:     Effort: Pulmonary effort is normal.     Breath sounds: Normal breath sounds.  Abdominal:     Palpations: Abdomen is soft.     Tenderness: There is abdominal tenderness.  Musculoskeletal:     Cervical back: Neck supple. No tenderness or bony tenderness.     Thoracic back: No tenderness or bony tenderness.  Lumbar back: No tenderness or bony tenderness.     Right lower leg: No edema.     Left lower leg: No edema.  Skin:    General: Skin is warm and dry.     Findings: No erythema or rash.  Neurological:     Mental Status: He is alert and oriented to person, place, and time.     Cranial Nerves: No cranial nerve deficit.     Sensory: No sensory deficit.     Motor: No weakness.     Coordination: Coordination normal.  Psychiatric:        Behavior: Behavior normal.     ED Results / Procedures / Treatments   Labs (all labs ordered are listed, but only abnormal results are displayed) Labs Reviewed  CBC WITH DIFFERENTIAL/PLATELET - Abnormal; Notable for the following components:      Result Value   WBC 12.9 (*)    Neutro Abs 10.9 (*)    All  other components within normal limits  COMPREHENSIVE METABOLIC PANEL - Abnormal; Notable for the following components:   Potassium 3.4 (*)    Glucose, Bld 101 (*)    Calcium 7.4 (*)    Total Protein 5.9 (*)    Albumin 3.2 (*)    Total Bilirubin 1.3 (*)    All other components within normal limits  URINALYSIS, ROUTINE W REFLEX MICROSCOPIC - Abnormal; Notable for the following components:   Color, Urine STRAW (*)    Specific Gravity, Urine 1.044 (*)    All other components within normal limits  CBG MONITORING, ED - Abnormal; Notable for the following components:   Glucose-Capillary 106 (*)    All other components within normal limits  RESP PANEL BY RT-PCR (RSV, FLU A&B, COVID)  RVPGX2  LIPASE, BLOOD  TROPONIN I (HIGH SENSITIVITY)  TROPONIN I (HIGH SENSITIVITY)    EKG EKG Interpretation  Date/Time:  Monday May 19 2022 07:28:19 EST Ventricular Rate:  73 PR Interval:  157 QRS Duration: 96 QT Interval:  407 QTC Calculation: 449 R Axis:   74 Text Interpretation: Sinus rhythm Abnormal R-wave progression, early transition Confirmed by Dorie Rank [id number]) on 05/19/2022 8:46:12 AM  Radiology CT Abdomen Pelvis W Contrast  Result Date: 05/19/2022 CLINICAL DATA:  Acute generalized abdominal pain. EXAM: CT ABDOMEN AND PELVIS WITH CONTRAST TECHNIQUE: Multidetector CT imaging of the abdomen and pelvis was performed using the standard protocol following bolus administration of intravenous contrast. RADIATION DOSE REDUCTION: This exam was performed according to the departmental dose-optimization program which includes automated exposure control, adjustment of the mA and/or kV according to patient size and/or use of iterative reconstruction technique. CONTRAST:  13m OMNIPAQUE IOHEXOL 300 MG/ML  SOLN COMPARISON:  May 01, 2019. FINDINGS: Lower chest: No acute abnormality. Hepatobiliary: No focal liver abnormality is seen. No gallstones, gallbladder wall thickening, or biliary dilatation.  Pancreas: Unremarkable. No pancreatic ductal dilatation or surrounding inflammatory changes. Spleen: Normal in size without focal abnormality. Adrenals/Urinary Tract: Adrenal glands are unremarkable. Kidneys are normal, without renal calculi, focal lesion, or hydronephrosis. Bladder is unremarkable. Stomach/Bowel: The stomach is unremarkable. There is no evidence of bowel obstruction or inflammation. Status post appendectomy. Vascular/Lymphatic: No significant vascular findings are present. No enlarged abdominal or pelvic lymph nodes. Reproductive: Prostate is unremarkable. Other: No abdominal wall hernia or abnormality. No abdominopelvic ascites. Musculoskeletal: No acute or significant osseous findings. IMPRESSION: No acute abnormality seen in the abdomen or pelvis. Electronically Signed   By: JMarijo ConceptionM.D.   On: 05/19/2022  09:43   CT HEAD WO CONTRAST  Result Date: 05/19/2022 CLINICAL DATA:  Head trauma, coagulopathy (Age 43-64y) syncope, ASA and plavix EXAM: CT HEAD WITHOUT CONTRAST TECHNIQUE: Contiguous axial images were obtained from the base of the skull through the vertex without intravenous contrast. RADIATION DOSE REDUCTION: This exam was performed according to the departmental dose-optimization program which includes automated exposure control, adjustment of the mA and/or kV according to patient size and/or use of iterative reconstruction technique. COMPARISON:  CT head 03/31/2020. FINDINGS: Brain: No evidence of acute infarction, hemorrhage, hydrocephalus, extra-axial collection or mass lesion/mass effect. Vascular: No hyperdense vessel identified. Skull: No acute fracture. Sinuses/Orbits: Clear sinuses. No mastoid effusions. No acute orbital findings. IMPRESSION: No evidence of acute intracranial abnormality. Electronically Signed   By: Margaretha Sheffield M.D.   On: 05/19/2022 08:23   DG Chest Port 1 View  Result Date: 05/19/2022 CLINICAL DATA:  Chest pain and syncope EXAM: PORTABLE CHEST 1  VIEW COMPARISON:  05/07/2022 FINDINGS: Heart size is normal. Mild aortic atherosclerotic calcification is noted. The lungs are clear. The vascularity is normal. No abnormal bone finding. IMPRESSION: No active disease. Mild aortic atherosclerotic calcification. Electronically Signed   By: Nelson Chimes M.D.   On: 05/19/2022 08:09    Procedures Procedures    Medications Ordered in ED Medications  sodium chloride (PF) 0.9 % injection (has no administration in time range)  sodium chloride 0.9 % bolus 1,000 mL (0 mLs Intravenous Stopped 05/19/22 1122)  iohexol (OMNIPAQUE) 300 MG/ML solution 100 mL (100 mLs Intravenous Contrast Given 05/19/22 [id number])    ED Course/ Medical Decision Making/ A&P                             Medical Decision Making Amount and/or Complexity of Data Reviewed Labs: ordered. Radiology: ordered. ECG/medicine tests: ordered.   This patient presents to the ED for concern of abdominal pain, diarrhea, syncope, this involves an extensive number of treatment options, and is a complaint that carries with it a high risk of complications and morbidity.  The differential diagnosis includes but not limited to intracranial injury from fall/syncope, arrhythmia, metabolic abnormality, gastroenteritis, colitis, diverticulitis    Co morbidities that complicate the patient evaluation  TIA, CVA, hypertension, palpitations, diverticulosis, CAD (NSTEMI 07/19/2021, 1 stent placed)   Additional history obtained:  Additional history obtained from EMS who reports that CBG of 102 External records from outside source obtained and reviewed including follow up visit to PCP dated 05/07/22 for [facility] and abdominal bloating   Lab Tests:  I Ordered, and personally interpreted labs.  The pertinent results include:  CBG normal at 106. Lipase WNL, CBC with mild leukocytosis at 12.6, CMP without significant findings. RVP negative for covid/flu/rsv. Troponins negative at 3 and 3. UA normal.    Imaging  Studies ordered:  I ordered imaging studies including CXR, CT head, CT abd/pelvis  I independently visualized and interpreted imaging which showed no acute intracranial or intraabdominal findings. CXR normal. I agree with the radiologist interpretation   Cardiac Monitoring: / EKG:  The patient was maintained on a cardiac monitor.  I personally viewed and interpreted the cardiac monitored which showed an underlying rhythm of: sinus rhythm, rate 73   Consultations Obtained:  I requested consultation with the Dr. Tomi Bamberger, ER attending,  and discussed lab and imaging findings as well as pertinent plan - they recommend: have seen the patient, agree with plan for dc with PCP follow up  Problem List / ED Course / Critical interventions / Medication management  62 year old male brought in by EMS from home as above. On exam, well appearing, non toxic, in no distress. Exam is unremarkable/reassuring. Work up unrevealing. Recommend recheck with PCP, return to ER as needed.  I ordered medication including IVF  for syncopal episode  Reevaluation of the patient after these medicines showed that the patient improved I have reviewed the patients home medicines and have made adjustments as needed   Social Determinants of Health:  Has PCP   Test / Admission - Considered:  After comprehensive ER workup, stable for dc to follow up with PCP         Final Clinical Impression(s) / ED Diagnoses Final diagnoses:  Syncope, unspecified syncope type  Pain of upper abdomen  Diarrhea, unspecified type    Rx / DC Orders ED Discharge Orders     None         Roque Lias 05/19/22 1211    Dorie Rank, MD 05/19/22 [phone]

## 2022-05-22 ENCOUNTER — Encounter: Payer: Self-pay | Admitting: Pulmonary Disease

## 2022-05-22 ENCOUNTER — Ambulatory Visit: Payer: Commercial Managed Care - HMO | Admitting: Pulmonary Disease

## 2022-05-22 ENCOUNTER — Other Ambulatory Visit: Payer: Self-pay | Admitting: Internal Medicine

## 2022-05-22 VITALS — BP 118/78 | HR 67 | Ht 67.0 in | Wt 175.8 lb

## 2022-05-22 DIAGNOSIS — R0609 Other forms of dyspnea: Secondary | ICD-10-CM | POA: Diagnosis not present

## 2022-05-22 MED ORDER — METOCLOPRAMIDE HCL 10 MG PO TABS: 10.0000 mg | ORAL_TABLET | Freq: Three times a day (TID) | ORAL | 1 refills | Status: DC | PRN

## 2022-05-22 MED ORDER — ROSUVASTATIN CALCIUM 20 MG PO TABS: 20.0000 mg | ORAL_TABLET | Freq: Every day | ORAL | 5 refills | Status: DC

## 2022-05-22 NOTE — Progress Notes (Signed)
Todd [name]    [id number]    Sep 20, 1960  Primary Care Physician:Greene, [name], MD  Referring Physician: Wendie Agreste, MD [address] [place],  [place] [address]  Chief complaint:   Patient being seen for shortness of breath Complaining about bloating  HPI:  Shortness of breath at rest Continues to have a bloated feeling  Was seen about a year ago Schedule for PFT which he canceled after he started feeling better  Does not have any underlying lung disease known  States he tries to walk on a regular basis up to 30 minutes  Moves his bowels regularly, no usual gas  Just feels bloated all the time and feels this may be contributing to his shortness of breath  Never smoker  No pertinent occupational history  No family history of lung disease known to him   Outpatient Encounter Medications as of 05/22/2022  Medication Sig   aspirin EC 81 MG tablet Take 81 mg by mouth daily. Swallow whole.   clopidogrel (PLAVIX) 75 MG tablet Take 1 tablet (75 mg total) by mouth daily with breakfast.   clotrimazole-betamethasone (LOTRISONE) cream Apply 1 Application topically daily.   metoprolol succinate (TOPROL XL) 25 MG 24 hr tablet Take 1 tablet (25 mg total) by mouth daily.   rosuvastatin (CRESTOR) 20 MG tablet Take 1 tablet (20 mg total) by mouth daily.   hydrocortisone (ANUSOL-HC) 25 MG suppository Place 1 suppository (25 mg total) rectally 2 (two) times daily. As needed with flare. (Patient not taking: Reported on 05/22/2022)   nitroGLYCERIN (NITROSTAT) 0.4 MG SL tablet Place 1 tablet (0.4 mg total) under the tongue every 5 (five) minutes as needed for chest pain.   No facility-administered encounter medications on file as of 05/22/2022.    Allergies as of 05/22/2022 - Review Complete 05/22/2022  Allergen Reaction Noted   Wellbutrin [bupropion hcl] Itching and Rash 07/01/2011    Past Medical History:  Diagnosis Date   CAD (coronary artery disease)     Complicated migraine    Deafness in left ear    Diverticulosis    External hemorrhoids    Headache    Hypertension    Internal hemorrhoids    Palpitations    Stroke ([facility])    TIA (transient ischemic attack)    Tubular adenoma of colon    Vision abnormalities     Past Surgical History:  Procedure Laterality Date   APPENDECTOMY  12/01/11   lap appy   CORONARY STENT INTERVENTION N/A 07/17/2021   Procedure: CORONARY STENT INTERVENTION;  Surgeon: Burnell Blanks, MD;  Location: [facility];  Service: Cardiovascular;  Laterality: N/A;   LAPAROSCOPIC APPENDECTOMY  12/02/2011   Procedure: APPENDECTOMY LAPAROSCOPIC;  Surgeon: Madilyn Hook, DO;  Location: [facility];  Service: General;  Laterality: N/A;   LEFT HEART CATH AND CORONARY ANGIOGRAPHY N/A 07/17/2021   Procedure: LEFT HEART CATH AND CORONARY ANGIOGRAPHY;  Surgeon: Burnell Blanks, MD;  Location: [facility];  Service: Cardiovascular;  Laterality: N/A;    Family History  Problem Relation Age of Onset   Hypertension Mother    Hypertension Father    Anemia Neg Hx    Arrhythmia Neg Hx    Asthma Neg Hx    Clotting disorder Neg Hx    Fainting Neg Hx    Heart attack Neg Hx    Heart disease Neg Hx    Heart failure Neg Hx  Hyperlipidemia Neg Hx    Colon cancer Neg Hx    Esophageal cancer Neg Hx    Stomach cancer Neg Hx    Rectal cancer Neg Hx     Social History   Socioeconomic History   Marital status: Divorced    Spouse name: Not on file   Number of children: Not on file   Years of education: Not on file   Highest education level: Not on file  Occupational History   Occupation: part time  Tobacco Use   Smoking status: Never   Smokeless tobacco: Never  Vaping Use   Vaping Use: Never used  Substance and Sexual Activity   Alcohol use: No   Drug use: No   Sexual activity: Not on file  Other Topics Concern   Not on file  Social History Narrative   Lives alone   Right Handed   Drinks very  little caffeine   Social Determinants of Health   Financial Resource Strain: Not on file  Food Insecurity: Not on file  Transportation Needs: Not on file  Physical Activity: Not on file  Stress: Not on file  Social Connections: Not on file  Intimate Partner Violence: Not on file    Review of Systems  Respiratory:  Positive for shortness of breath.   Gastrointestinal:        Bloating    Vitals:   05/22/22 1541  BP: 118/78  Pulse: 67  SpO2: 98%     Physical Exam Vitals reviewed.  Constitutional:      Appearance: Normal appearance.  HENT:     Head: Normocephalic.     Nose: No congestion.     Mouth/Throat:     Mouth: Mucous membranes are moist.  Eyes:     General: No scleral icterus.    Pupils: Pupils are equal, round, and reactive to light.  Cardiovascular:     Rate and Rhythm: Normal rate and regular rhythm.     Heart sounds: No murmur heard.    No friction rub.  Pulmonary:     Effort: No respiratory distress.     Breath sounds: No stridor. No wheezing or rhonchi.  Musculoskeletal:     Cervical back: No rigidity or tenderness.  Neurological:     Mental Status: He is alert.  Psychiatric:        Mood and Affect: Mood normal.    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

## 2022-05-22 NOTE — Patient Instructions (Signed)
Prescription for Reglan sent to pharmacy for you -This will help symptoms of bloating, gas, helps forward flow of your gut  Simethicone is over-the-counter -Can be used for bloating  Keep your appointment for your breathing study  I will see you in about 3 months

## 2022-06-01 ENCOUNTER — Emergency Department (HOSPITAL_BASED_OUTPATIENT_CLINIC_OR_DEPARTMENT_OTHER)
Admission: EM | Admit: 2022-06-01 | Discharge: 2022-06-02 | Disposition: A | Discharge status: Home / Self Care | Payer: Commercial Managed Care - HMO | Attending: Emergency Medicine | Admitting: Emergency Medicine

## 2022-06-01 ENCOUNTER — Encounter (HOSPITAL_BASED_OUTPATIENT_CLINIC_OR_DEPARTMENT_OTHER): Payer: Self-pay | Admitting: Emergency Medicine

## 2022-06-01 ENCOUNTER — Other Ambulatory Visit: Payer: Self-pay

## 2022-06-01 DIAGNOSIS — Z7982 Long term (current) use of aspirin: Secondary | ICD-10-CM | POA: Insufficient documentation

## 2022-06-01 DIAGNOSIS — R251 Tremor, unspecified: Secondary | ICD-10-CM | POA: Diagnosis present

## 2022-06-01 DIAGNOSIS — Z7902 Long term (current) use of antithrombotics/antiplatelets: Secondary | ICD-10-CM | POA: Insufficient documentation

## 2022-06-01 DIAGNOSIS — I2510 Atherosclerotic heart disease of native coronary artery without angina pectoris: Secondary | ICD-10-CM | POA: Diagnosis not present

## 2022-06-01 DIAGNOSIS — Z79899 Other long term (current) drug therapy: Secondary | ICD-10-CM | POA: Diagnosis not present

## 2022-06-01 DIAGNOSIS — R450 Nervousness: Secondary | ICD-10-CM

## 2022-06-01 LAB — CBC
HCT: 40.9 % (ref 39.0–52.0)
Hemoglobin: 14.1 g/dL (ref 13.0–17.0)
MCH: 29.7 pg (ref 26.0–34.0)
MCHC: 34.5 g/dL (ref 30.0–36.0)
MCV: 86.3 fL (ref 80.0–100.0)
Platelets: 212 10*3/uL (ref 150–400)
RBC: 4.74 MIL/uL (ref 4.22–5.81)
RDW: 12.2 % (ref 11.5–15.5)
WBC: 10.1 10*3/uL (ref 4.0–10.5)
nRBC: 0 % (ref 0.0–0.2)

## 2022-06-01 LAB — CBG MONITORING, ED: Glucose-Capillary: 100 mg/dL — ABNORMAL HIGH (ref 70–99)

## 2022-06-01 NOTE — ED Provider Notes (Signed)
[facility] AT [facility]  Provider Note  CSN: [id number] Arrival date & time: 06/01/22 2145  History Chief Complaint  Patient presents with   Shaking    Todd [name] is a 62 y.o. male with history of CAD, on ASA/Plavix reports for the last 3 days he has had episodes of feeling hungry, then feeling jittery/shaky all over. He has had improvement in prior episodes with eating. Tonight also reports his legs felt weak. He went to [facility] and bought a glucometer and reports his glucose was 80. He is not diabetic. He reports he ate some M&Ms and his symptoms resolved. Currently is asymptomatic. He reports for several weeks he has felt bloated and has been having occasional diarrhea. He was in the ED 2/26 for syncope with a negative workup. He saw his Pulmonologist on 2/29 and mentioned the bloating as part of that visit, prescribed Reglan but has not taken any yet. He denies any vomiting, no blood in stool. He has had intermittent episodes of epistaxis recently.    Home Medications Prior to Admission medications   Medication Sig Start Date End Date Taking? Authorizing Provider  aspirin EC 81 MG tablet Take 81 mg by mouth daily. Swallow whole.    [provider]  clopidogrel (PLAVIX) 75 MG tablet Take 1 tablet (75 mg total) by mouth daily with breakfast. 10/10/21   Marjie Skiff E, PA-C  clotrimazole-betamethasone (LOTRISONE) cream Apply 1 Application topically daily. 02/06/22   Shade Flood, MD  hydrocortisone (ANUSOL-HC) 25 MG suppository Place 1 suppository (25 mg total) rectally 2 (two) times daily. As needed with flare. Patient not taking: Reported on 05/22/2022 02/06/22   Shade Flood, MD  metoCLOPramide (REGLAN) 10 MG tablet Take 1 tablet (10 mg total) by mouth every 8 (eight) hours as needed for nausea. 05/22/22   Virl Diamond A, MD  metoprolol succinate (TOPROL XL) 25 MG 24 hr tablet Take 1 tablet (25 mg total) by mouth daily. 10/10/21    Corrin Parker, PA-C  nitroGLYCERIN (NITROSTAT) 0.4 MG SL tablet Place 1 tablet (0.4 mg total) under the tongue every 5 (five) minutes as needed for chest pain. 07/12/21 11/04/21  Alver Sorrow, NP  rosuvastatin (CRESTOR) 20 MG tablet Take 1 tablet (20 mg total) by mouth daily. 05/22/22   Hilty, Lisette Abu, MD     Allergies    Wellbutrin [bupropion hcl]   Review of Systems   Review of Systems Please see HPI for pertinent positives and negatives  Physical Exam BP 120/80   Pulse 65   Temp 98 F (36.7 C) (Oral)   Resp 16   Wt 78 kg   SpO2 95%   BMI 26.94 kg/m   Physical Exam Vitals and nursing note reviewed.  Constitutional:      Appearance: Normal appearance.  HENT:     Head: Normocephalic and atraumatic.     Nose:     Comments: Dried blood in L naris, no active bleeding    Mouth/Throat:     Mouth: Mucous membranes are moist.  Eyes:     Extraocular Movements: Extraocular movements intact.     Conjunctiva/sclera: Conjunctivae normal.  Cardiovascular:     Rate and Rhythm: Normal rate.  Pulmonary:     Effort: Pulmonary effort is normal.     Breath sounds: Normal breath sounds.  Abdominal:     General: Abdomen is flat.     Palpations: Abdomen is soft.     Tenderness: There is  no abdominal tenderness. There is no guarding.  Musculoskeletal:        General: No swelling. Normal range of motion.     Cervical back: Neck supple.  Skin:    General: Skin is warm and dry.  Neurological:     General: No focal deficit present.     Mental Status: He is alert.  Psychiatric:        Mood and Affect: Mood normal.     ED Results / Procedures / Treatments   EKG EKG Interpretation  Date/Time:  Sunday June 01 2022 22:08:21 EDT Ventricular Rate:  73 PR Interval:  148 QRS Duration: 86 QT Interval:  402 QTC Calculation: 442 R Axis:   29 Text Interpretation: Normal sinus rhythm Normal ECG When compared with ECG of 19-May-2022 07:28, PREVIOUS ECG IS PRESENT No significant  change since last tracing Confirmed by Gwyneth Sprout ([id number]) on 06/01/2022 10:31:20 PM  Procedures Procedures  Medications Ordered in the ED Medications - No data to display  Initial Impression and Plan  Patient here with symptoms of hunger and jitteriness. No clear etiology but could be transient hypoglycemia or vasovagal pre-syncope from his GI symptoms. EKG is normal. Vitals and exam are reassuring. Will check basic labs.  ED Course   Clinical Course as of 06/02/22 0032  Wynelle Link Jun 01, 2022  2356 CBC is normal.  [CS]  Mon Jun 02, 2022  0025 CMP is normal. Patient remains asymptomatic. Advised to check his sugar immediately if symptoms return. Follow up with PCP. Referral to GI for his stomach symptoms. RTED for any other concerns.  [CS]    Clinical Course User Index [CS] Pollyann Savoy, MD     MDM Rules/Calculators/A&P Medical Decision Making Problems Addressed: Jittery feeling: acute illness or injury  Amount and/or Complexity of Data Reviewed Labs: ordered. Decision-making details documented in ED Course.     Final Clinical Impression(s) / ED Diagnoses Final diagnoses:  Jittery feeling    Rx / DC Orders ED Discharge Orders     None        Pollyann Savoy, MD 06/02/22 [phone]

## 2022-06-01 NOTE — ED Triage Notes (Addendum)
Pt in with shaking hands and knees. Pt states this has gone on throughout the evening, and thinks it may be related to low blood sugar. States he felt syncopal and then had some M&M's 4mn PTA and felt better, CBG 100 in triage. Pt also states he has had some jaundice before in the past, but no formal diagnosis regarding this, "it just resolved and got better". Reports nose bleeds x 3 days. Takes Plavix daily

## 2022-06-02 ENCOUNTER — Encounter: Payer: Self-pay | Admitting: Family Medicine

## 2022-06-02 ENCOUNTER — Ambulatory Visit (INDEPENDENT_AMBULATORY_CARE_PROVIDER_SITE_OTHER): Payer: Commercial Managed Care - HMO | Admitting: Family Medicine

## 2022-06-02 VITALS — BP 118/70 | HR 69 | Temp 98.7°F | Ht 67.0 in | Wt 179.0 lb

## 2022-06-02 DIAGNOSIS — R6881 Early satiety: Secondary | ICD-10-CM | POA: Diagnosis not present

## 2022-06-02 DIAGNOSIS — R140 Abdominal distension (gaseous): Secondary | ICD-10-CM | POA: Diagnosis not present

## 2022-06-02 DIAGNOSIS — R251 Tremor, unspecified: Secondary | ICD-10-CM

## 2022-06-02 DIAGNOSIS — H9192 Unspecified hearing loss, left ear: Secondary | ICD-10-CM | POA: Insufficient documentation

## 2022-06-02 LAB — COMPREHENSIVE METABOLIC PANEL
ALT: 18 U/L (ref 0–44)
AST: 19 U/L (ref 15–41)
Albumin: 3.7 g/dL (ref 3.5–5.0)
Alkaline Phosphatase: 42 U/L (ref 38–126)
Anion gap: 9 (ref 5–15)
BUN: 19 mg/dL (ref 6–20)
CO2: 23 mmol/L (ref 22–32)
Calcium: 9.1 mg/dL (ref 8.9–10.3)
Chloride: 106 mmol/L (ref 98–111)
Creatinine, Ser: 0.9 mg/dL (ref 0.61–1.24)
GFR, Estimated: >60 mL/min (ref >60)
Glucose, Bld: 98 mg/dL (ref 70–99)
Potassium: 4 mmol/L (ref 3.5–5.1)
Sodium: 138 mmol/L (ref 135–145)
Total Bilirubin: 1.2 mg/dL (ref 0.3–1.2)
Total Protein: 6.5 g/dL (ref 6.5–8.1)

## 2022-06-02 NOTE — Progress Notes (Signed)
Subjective:  Patient ID: Todd [name], male    DOB: Dec 09, 1960  Age: 62 y.o. MRN: [id number]  CC:  Chief Complaint  Patient presents with   Blood Sugar Problem    Pt is here today to discuss blood Sugar. SX . Shaking hands and legs, blurred vision, weak. Pt states this episode happen 3 days starting 05/31/2022 . Was seen at ER ([facility]) last night and sugar range was 80-100. Had labs done.    HPI Todd [name] presents for   Jittery feeling/shakiness Follow-up from ER visit yesterday.  Notes reviewed.  Episodes of feeling hungry, jittery, shaky.  Symptoms did improve with eating.  On night of the ER visit felt like his legs were weak.  Bought a glucometer, glucose was 80.  Ate some M&Ms candies and symptoms resolved.  Asymptomatic in ER.  CBC normal.  CMP normal.  Glucose 100 in the ER yesterday. Lowest measured blood sugar 80 yesterday - occurred at 6 hours after meal.  Prior day - 3 hours after meal.  Similar episodes about once a year, hunger feeling in stomach, hands become shaky, vision gets blurry, feels weak all over, then symptoms resolve in 5-10 minutes  with eating. Concerned that continued flares for 3 days.  Has typically about 4 hours or more after eating. No abdominal pain. Still some abdominal bloating during the day.  Denies preceding anxiety or anxiety at time of symptoms.  Glucose 92.  Fasting for Ramadan - 2am - 7pm - today is first day - for 30 days.  Food schedule: Wakes at 9, breakfast at 12pm-1pm - not feeling hungry until then. Concerned may get sick if eats earlier. Egg biscuit.  Next meal at 5-8pm, snacks inbetween.  No recent syncope.  2 16oz bottles water per day.   He was also evaluated in ER February 26 after abdominal pain, nausea, diarrhea episode, headache and syncopal episode.  Had reported some bloating at that time.  Chest x-ray, CT head and CT abdomen pelvis obtained without any acute findings.  Workup/exam was unremarkable and reassuring.  He was  seen on February 29 by pulmonary.  Evaluated for dyspnea exertion.  Plan for rescheduling of pulmonary function test.  Bloating was discussed and prescription for Reglan up to 3 times daily if needed along with simethicone over-the-counter for bloating.  Had not taken any Reglan or simethicone.   Has cut out broccoli, and other gas producing food, less garlic.  Smaller portions of meals. Feels like gets full sooner with smaller meals. No carbonated beverages.    Read online. Concerned about liver.    Wt Readings from Last 3 Encounters:  06/02/22 179 lb (81.2 kg)  06/01/22 172 lb (78 kg)  05/22/22 175 lb 12.8 oz (79.7 kg)   Weight 170 in 01/2022, 181 on 05/07/22.    History Patient Active Problem List   Diagnosis Date Noted   Hearing loss of left ear 06/02/2022   Chronic anticoagulation 08/26/2021   Internal hemorrhoid, bleeding 08/26/2021   Prolapsed internal hemorrhoids, grade 3 08/26/2021   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 coronary artery disease 08/26/2021   NSTEMI (non-ST elevated myocardial infarction) ([facility]) 07/20/2021   CAD (coronary artery disease) 07/18/2021   Hyperlipidemia 07/18/2021   Unstable angina ([facility])    Non-recurrent acute serous otitis media of right ear 10/14/2018   Shortness of breath 11/12/2016   Chest pain 11/12/2016   Heart palpitations 10/20/2013   Situational anxiety 10/20/2013   Migraine with  visual aura 08/24/2013   TIA (transient ischemic attack) 08/23/2013   Blurred vision 08/23/2013   Anomia 08/23/2013   Past Medical History:  Diagnosis Date   CAD (coronary artery disease)    Complicated migraine    Deafness in left ear    Diverticulosis    External hemorrhoids    Headache    Hypertension    Internal hemorrhoids    Palpitations    Stroke ([facility])    TIA (transient ischemic attack)    Tubular adenoma of colon    Vision abnormalities    Past Surgical History:  Procedure Laterality Date    APPENDECTOMY  12/01/11   lap appy   CORONARY STENT INTERVENTION N/A 07/17/2021   Procedure: CORONARY STENT INTERVENTION;  Surgeon: Burnell Blanks, MD;  Location: [facility];  Service: Cardiovascular;  Laterality: N/A;   LAPAROSCOPIC APPENDECTOMY  12/02/2011   Procedure: APPENDECTOMY LAPAROSCOPIC;  Surgeon: Madilyn Hook, DO;  Location: [facility];  Service: General;  Laterality: N/A;   LEFT HEART CATH AND CORONARY ANGIOGRAPHY N/A 07/17/2021   Procedure: LEFT HEART CATH AND CORONARY ANGIOGRAPHY;  Surgeon: Burnell Blanks, MD;  Location: [facility];  Service: Cardiovascular;  Laterality: N/A;   Allergies  Allergen Reactions   Wellbutrin [Bupropion Hcl] Itching and Rash   Prior to Admission medications   Medication Sig Start Date End Date Taking? Authorizing Provider  aspirin EC 81 MG tablet Take 81 mg by mouth daily. Swallow whole.   Yes [provider]  clopidogrel (PLAVIX) 75 MG tablet Take 1 tablet (75 mg total) by mouth daily with breakfast. 10/10/21  Yes Sande Rives E, PA-C  clotrimazole-betamethasone (LOTRISONE) cream Apply 1 Application topically daily. 02/06/22  Yes Wendie Agreste, MD  hydrocortisone (ANUSOL-HC) 25 MG suppository Place 1 suppository (25 mg total) rectally 2 (two) times daily. As needed with flare. 02/06/22  Yes Wendie Agreste, MD  levocetirizine Harlow Ohms) 5 MG tablet Take by mouth. 10/12/18  Yes [provider]  metoCLOPramide (REGLAN) 10 MG tablet Take 1 tablet (10 mg total) by mouth every 8 (eight) hours as needed for nausea. 05/22/22  Yes Olalere, Adewale A, MD  metoprolol succinate (TOPROL XL) 25 MG 24 hr tablet Take 1 tablet (25 mg total) by mouth daily. 10/10/21  Yes Sande Rives E, PA-C  rosuvastatin (CRESTOR) 20 MG tablet Take 1 tablet (20 mg total) by mouth daily. 05/22/22  Yes Hilty, Nadean Corwin, MD  fluticasone (FLONASE) 50 MCG/ACT nasal spray 2 sprays Once Daily. Patient not taking: Reported on 06/02/2022 10/14/18    [provider]  nitroGLYCERIN (NITROSTAT) 0.4 MG SL tablet Place 1 tablet (0.4 mg total) under the tongue every 5 (five) minutes as needed for chest pain. 07/12/21 11/04/21  Loel Dubonnet, NP   Social History   Socioeconomic History   Marital status: Divorced    Spouse name: Not on file   Number of children: Not on file   Years of education: Not on file   Highest education level: Not on file  Occupational History   Occupation: part time  Tobacco Use   Smoking status: Never   Smokeless tobacco: Never  Vaping Use   Vaping Use: Never used  Substance and Sexual Activity   Alcohol use: No   Drug use: No   Sexual activity: Not on file  Other Topics Concern   Not on file  Social History Narrative   Lives alone   Right Handed   Drinks very little caffeine  Social Determinants of Health   Financial Resource Strain: Not on file  Food Insecurity: Not on file  Transportation Needs: Not on file  Physical Activity: Not on file  Stress: Not on file  Social Connections: Not on file  Intimate Partner Violence: Not on file    Review of Systems  Per HPI.  Objective:   Vitals:   06/02/22 1340  BP: 118/70  Pulse: 69  Temp: 98.7 F (37.1 C)  SpO2: 98%  Weight: 179 lb (81.2 kg)  Height: '5\' 7"'$  (1.702 m)     Physical Exam Vitals reviewed.  Constitutional:      Appearance: He is well-developed.  HENT:     Head: Normocephalic and atraumatic.  Neck:     Vascular: No carotid bruit or JVD.  Cardiovascular:     Rate and Rhythm: Normal rate and regular rhythm.     Heart sounds: Normal heart sounds. No murmur heard. Pulmonary:     Effort: Pulmonary effort is normal.     Breath sounds: Normal breath sounds. No rales.  Abdominal:     General: Abdomen is flat. Bowel sounds are normal. There is no distension.     Palpations: There is no mass.     Tenderness: There is no abdominal tenderness. There is no guarding.  Musculoskeletal:     Right lower leg: No edema.      Left lower leg: No edema.  Skin:    General: Skin is warm and dry.  Neurological:     Mental Status: He is alert and oriented to person, place, and time.  Psychiatric:        Mood and Affect: Mood normal.        Behavior: Behavior normal.     Comments: Flat affect, appropriate responses.         Assessment & Plan:  Bertin Cacciatore is a 62 y.o. male . Shakiness - Plan: Ambulatory referral to Gastroenterology  Abdominal bloating - Plan: Ambulatory referral to Gastroenterology  Early satiety - Plan: Ambulatory referral to Gastroenterology  Intermittent episodes of stomach sensation followed by hand shakiness, generalized weakness, blurry vision, quickly resolved with food but no documented hypoglycemia.  He is not diabetic, unlikely med related.  Differential includes glucagonoma but no documented true hypoglycemia.  Reassuring CMP, glucose in ER, and CT abdomen.  -Recommended regular meals, 3/day with snacks in between, and increase fluid intake.  This may be problematic currently as in the middle of a fast but discussed potential need for breaking fast for medical reasons.  If symptoms recur, would recommend doing so.  -Persistent abdominal bloating with early satiety, minimal change with avoiding FODMAPs.  Prior handout given.  As above, abdominal CT reassuring.  Weight is overall stable when compared to November of last year.  Will refer to gastroenterology to discuss further with RTC/ER precautions given.  No orders of the defined types were placed in this encounter.  Patient Instructions  Lab work including your liver tests looked good at that visit in the ER yesterday.  CAT scan did not show any liver or stomach issues, no tumors, no concerns.  I will refer you to gastroenterology to discuss the bloating symptoms and getting full on meal sooner, but continue to avoid the foods on the previous handout for abdominal bloating.  Shakiness episodes may be due to not having sufficient  food.  I would recommend 3 meals per day with snacks in between, not going more than 2 to 3 hours in between some  kind of nutrition.  Also recommend increasing fluid intake with a goal of 48 to 64 ounces of water per day.  I understand you are fasting right now and I do want to respect that, but if your symptoms return you may need to increase meals during the day temporarily.  If you do feel episodes of shakiness, check your blood sugar right away and have food available.  Let me know if you have any readings below 70.  Thank you for coming in to see me today.  Let me know if there are further questions.        Signed,   Merri Ray, MD [facility] 06/02/22 2:48 PM

## 2022-06-02 NOTE — Patient Instructions (Signed)
Lab work including your liver tests looked good at that visit in the ER yesterday.  CAT scan did not show any liver or stomach issues, no tumors, no concerns.  I will refer you to gastroenterology to discuss the bloating symptoms and getting full on meal sooner, but continue to avoid the foods on the previous handout for abdominal bloating.  Shakiness episodes may be due to not having sufficient food.  I would recommend 3 meals per day with snacks in between, not going more than 2 to 3 hours in between some kind of nutrition.  Also recommend increasing fluid intake with a goal of 48 to 64 ounces of water per day.  I understand you are fasting right now and I do want to respect that, but if your symptoms return you may need to increase meals during the day temporarily.  If you do feel episodes of shakiness, check your blood sugar right away and have food available.  Let me know if you have any readings below 70.  Thank you for coming in to see me today.  Let me know if there are further questions.

## 2022-06-09 ENCOUNTER — Ambulatory Visit (HOSPITAL_COMMUNITY)
Admission: EM | Admit: 2022-06-09 | Discharge: 2022-06-09 | Disposition: A | Discharge status: Home / Self Care | Payer: Commercial Managed Care - HMO | Attending: Family Medicine | Admitting: Family Medicine

## 2022-06-09 ENCOUNTER — Encounter (HOSPITAL_COMMUNITY): Payer: Self-pay | Admitting: Emergency Medicine

## 2022-06-09 DIAGNOSIS — Z7902 Long term (current) use of antithrombotics/antiplatelets: Secondary | ICD-10-CM | POA: Diagnosis not present

## 2022-06-09 DIAGNOSIS — J029 Acute pharyngitis, unspecified: Secondary | ICD-10-CM

## 2022-06-09 DIAGNOSIS — I25110 Atherosclerotic heart disease of native coronary artery with unstable angina pectoris: Secondary | ICD-10-CM | POA: Insufficient documentation

## 2022-06-09 DIAGNOSIS — Z1152 Encounter for screening for COVID-19: Secondary | ICD-10-CM | POA: Diagnosis not present

## 2022-06-09 LAB — POCT RAPID STREP A, ED / UC: Streptococcus, Group A Screen (Direct): NEGATIVE

## 2022-06-09 MED ORDER — BENZONATATE 100 MG PO CAPS: 100.0000 mg | ORAL_CAPSULE | Freq: Three times a day (TID) | ORAL | 0 refills | Status: DC | PRN

## 2022-06-09 MED ORDER — TRAMADOL HCL 50 MG PO TABS: 50.0000 mg | ORAL_TABLET | Freq: Four times a day (QID) | ORAL | 0 refills | Status: DC | PRN

## 2022-06-09 NOTE — ED Provider Notes (Signed)
[place]    CSN: [id number] Arrival date & time: 06/09/22  1321      History   Chief Complaint Chief Complaint  Patient presents with   Cough   Sore Throat    HPI Todd [name] is a 63 y.o. male.    Cough Sore Throat   Here for sore throat.  Symptoms began on March 15.  It got worse the next day and it is worse every morning.  It hurts to swallow and it makes it difficult to eat or drink much.  He has a slight cough, but he denies any nasal drainage or congestion.  He felt chilled first 24 hours but has not noticed that since.  No fever noted at home.  No nausea or vomiting or diarrhea.   He has coronary artery disease and takes Plavix.  Past Medical History:  Diagnosis Date   CAD (coronary artery disease)    Complicated migraine    Deafness in left ear    Diverticulosis    External hemorrhoids    Headache    Hypertension    Internal hemorrhoids    Palpitations    Stroke [facility])    TIA (transient ischemic attack)    Tubular adenoma of colon    Vision abnormalities     Patient Active Problem List   Diagnosis Date Noted   Hearing loss of left ear 06/02/2022   Chronic anticoagulation 08/26/2021   Internal hemorrhoid, bleeding 08/26/2021   Prolapsed internal hemorrhoids, grade 3 08/26/2021   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 coronary artery disease 08/26/2021   NSTEMI (non-ST elevated myocardial infarction) ([place]) 07/20/2021   CAD (coronary artery disease) 07/18/2021   Hyperlipidemia 07/18/2021   Unstable angina (HCC)    Non-recurrent acute serous otitis media of right ear 10/14/2018   Shortness of breath 11/12/2016   Chest pain 11/12/2016   Heart palpitations 10/20/2013   Situational anxiety 10/20/2013   Migraine with visual aura 08/24/2013   TIA (transient ischemic attack) 08/23/2013   Blurred vision 08/23/2013   Anomia 08/23/2013    Past Surgical History:  Procedure Laterality Date   APPENDECTOMY   12/01/11   lap appy   CORONARY STENT INTERVENTION N/A 07/17/2021   Procedure: CORONARY STENT INTERVENTION;  Surgeon: Burnell Blanks, MD;  Location: [facility];  Service: Cardiovascular;  Laterality: N/A;   LAPAROSCOPIC APPENDECTOMY  12/02/2011   Procedure: APPENDECTOMY LAPAROSCOPIC;  Surgeon: Madilyn Hook, DO;  Location: [facility];  Service: General;  Laterality: N/A;   LEFT HEART CATH AND CORONARY ANGIOGRAPHY N/A 07/17/2021   Procedure: LEFT HEART CATH AND CORONARY ANGIOGRAPHY;  Surgeon: Burnell Blanks, MD;  Location: [facility];  Service: Cardiovascular;  Laterality: N/A;       Home Medications    Prior to Admission medications   Medication Sig Start Date End Date Taking? Authorizing Provider  benzonatate (TESSALON) 100 MG capsule Take 1 capsule (100 mg total) by mouth 3 (three) times daily as needed for cough. 06/09/22  Yes Ellianah Cordy, Gwenlyn Perking, MD  traMADol (ULTRAM) 50 MG tablet Take 1 tablet (50 mg total) by mouth every 6 (six) hours as needed (pain). 06/09/22  Yes Barrett Henle, MD  aspirin EC 81 MG tablet Take 81 mg by mouth daily. Swallow whole.    [provider]  clopidogrel (PLAVIX) 75 MG tablet Take 1 tablet (75 mg total) by mouth daily with breakfast. 10/10/21   Darreld Mclean, PA-C  clotrimazole-betamethasone (LOTRISONE) cream Apply 1 Application topically daily. 02/06/22   Wendie Agreste, MD  fluticasone (FLONASE) 50 MCG/ACT nasal spray 2 sprays Once Daily. Patient not taking: Reported on 06/02/2022 10/14/18   [provider]  hydrocortisone (ANUSOL-HC) 25 MG suppository Place 1 suppository (25 mg total) rectally 2 (two) times daily. As needed with flare. 02/06/22   Wendie Agreste, MD  levocetirizine Harlow Ohms) 5 MG tablet Take by mouth. 10/12/18   [provider]  metoCLOPramide (REGLAN) 10 MG tablet Take 1 tablet (10 mg total) by mouth every 8 (eight) hours as needed for nausea. 05/22/22   Sherrilyn Rist A, MD   metoprolol succinate (TOPROL XL) 25 MG 24 hr tablet Take 1 tablet (25 mg total) by mouth daily. 10/10/21   Darreld Mclean, PA-C  nitroGLYCERIN (NITROSTAT) 0.4 MG SL tablet Place 1 tablet (0.4 mg total) under the tongue every 5 (five) minutes as needed for chest pain. 07/12/21 11/04/21  Loel Dubonnet, NP  rosuvastatin (CRESTOR) 20 MG tablet Take 1 tablet (20 mg total) by mouth daily. 05/22/22   Hilty, Nadean Corwin, MD    Family History Family History  Problem Relation Age of Onset   Hypertension Mother    Hypertension Father    Anemia Neg Hx    Arrhythmia Neg Hx    Asthma Neg Hx    Clotting disorder Neg Hx    Fainting Neg Hx    Heart attack Neg Hx    Heart disease Neg Hx    Heart failure Neg Hx    Hyperlipidemia Neg Hx    Colon cancer Neg Hx    Esophageal cancer Neg Hx    Stomach cancer Neg Hx    Rectal cancer Neg Hx     Social History Social History   Tobacco Use   Smoking status: Never   Smokeless tobacco: Never  Vaping Use   Vaping Use: Never used  Substance Use Topics   Alcohol use: No   Drug use: No     Allergies   Wellbutrin [bupropion hcl]   Review of Systems Review of Systems  Respiratory:  Positive for cough.      Physical Exam Triage Vital Signs ED Triage Vitals [06/09/22 1457]  Enc Vitals Group     BP 121/81     Pulse Rate 67     Resp 14     Temp 98.5 F (36.9 C)     Temp Source Oral     SpO2 94 %     Weight      Height      Head Circumference      Peak Flow      Pain Score 7     Pain Loc      Pain Edu?      Excl. in [place]?    No data found.  Updated Vital Signs BP 121/81 (BP Location: Right Arm)   Pulse 67   Temp 98.5 F (36.9 C) (Oral)   Resp 14   SpO2 94%   Visual Acuity Right Eye Distance:   Left Eye Distance:   Bilateral Distance:    Right Eye Near:   Left Eye Near:    Bilateral Near:     Physical Exam Vitals reviewed.  Constitutional:      General: He is not in acute distress.    Appearance: He is not  toxic-appearing.  HENT:     Right Ear: Tympanic membrane and ear canal normal.  Left Ear: Tympanic membrane and ear canal normal.     Nose: Nose normal.     Mouth/Throat:     Mouth: Mucous membranes are moist.     Comments: Edema of the soft palate in the oropharynx.  There is some clear mucus in the oropharynx. Eyes:     Extraocular Movements: Extraocular movements intact.     Conjunctiva/sclera: Conjunctivae normal.     Pupils: Pupils are equal, round, and reactive to light.  Cardiovascular:     Rate and Rhythm: Normal rate and regular rhythm.     Heart sounds: No murmur heard. Pulmonary:     Effort: Pulmonary effort is normal. No respiratory distress.     Breath sounds: No stridor. No wheezing, rhonchi or rales.  Musculoskeletal:     Cervical back: Neck supple.  Lymphadenopathy:     Cervical: No cervical adenopathy.  Skin:    Capillary Refill: Capillary refill takes less than 2 seconds.     Coloration: Skin is not jaundiced or pale.  Neurological:     General: No focal deficit present.     Mental Status: He is alert and oriented to person, place, and time.  Psychiatric:        Behavior: Behavior normal.      UC Treatments / Results  Labs (all labs ordered are listed, but only abnormal results are displayed) Labs Reviewed  CULTURE, GROUP A STREP ([place])  SARS CORONAVIRUS 2 (TAT 6-24 HRS)  POCT RAPID STREP A, ED / UC    EKG   Radiology No results found.  Procedures Procedures (including critical care time)  Medications Ordered in UC Medications - No data to display  Initial Impression / Assessment and Plan / UC Course  I have reviewed the triage vital signs and the nursing notes.  Pertinent labs & imaging results that were available during my care of the patient were reviewed by me and considered in my medical decision making (see chart for details).        Rapid strep is negative and c/s is sent; we will notify and tx per protocol if  positive.  COVID swab is done, and if positive he is a candidate for molnupiravir; he has CAD, and cannot take paxlovid as it interacts with his clopidogrel Final Clinical Impressions(s) / UC Diagnoses   Final diagnoses:  Acute pharyngitis, unspecified etiology     Discharge Instructions      Your strep test is negative.  Culture of the throat will be sent, and staff will notify you if that is in turn positive.  Take tramadol 50 mg-- 1 tablet every 6 hours as needed for pain.  This medication can make you sleepy or dizzy  Take benzonatate 100 mg, 1 tab every 8 hours as needed for cough.   You have been swabbed for COVID, and the test will result in the next 24 hours. Our staff will call you if positive. If the COVID test is positive, you should quarantine until you are fever free for 24 hours and you are starting to feel better, and then take added precautions for the next 5 days, such as physical distancing/wearing a mask and good hand hygiene/washing.        ED Prescriptions     Medication Sig Dispense Auth. Provider   benzonatate (TESSALON) 100 MG capsule Take 1 capsule (100 mg total) by mouth 3 (three) times daily as needed for cough. 21 capsule Barrett Henle, MD   traMADol (ULTRAM) 50 MG  tablet Take 1 tablet (50 mg total) by mouth every 6 (six) hours as needed (pain). 12 tablet Verenis Nicosia, Gwenlyn Perking, MD      I have reviewed the PDMP during this encounter.   Barrett Henle, MD 06/09/22 [id number]

## 2022-06-09 NOTE — Discharge Instructions (Addendum)
Your strep test is negative.  Culture of the throat will be sent, and staff will notify you if that is in turn positive.  Take tramadol 50 mg-- 1 tablet every 6 hours as needed for pain.  This medication can make you sleepy or dizzy  Take benzonatate 100 mg, 1 tab every 8 hours as needed for cough.   You have been swabbed for COVID, and the test will result in the next 24 hours. Our staff will call you if positive. If the COVID test is positive, you should quarantine until you are fever free for 24 hours and you are starting to feel better, and then take added precautions for the next 5 days, such as physical distancing/wearing a mask and good hand hygiene/washing.

## 2022-06-09 NOTE — ED Triage Notes (Signed)
Pt had cough, congestion and sore throat for 3 days. Hasn't taken any medications

## 2022-06-10 LAB — SARS CORONAVIRUS 2 (TAT 6-24 HRS): SARS Coronavirus 2: NEGATIVE

## 2022-06-11 LAB — CULTURE, GROUP A STREP (THRC)

## 2022-06-12 ENCOUNTER — Encounter: Payer: Commercial Managed Care - HMO | Admitting: Family Medicine

## 2022-07-05 ENCOUNTER — Other Ambulatory Visit: Payer: Self-pay | Admitting: Student

## 2022-07-10 ENCOUNTER — Ambulatory Visit (INDEPENDENT_AMBULATORY_CARE_PROVIDER_SITE_OTHER): Payer: Commercial Managed Care - HMO | Admitting: Family Medicine

## 2022-07-10 ENCOUNTER — Encounter: Payer: Self-pay | Admitting: Family Medicine

## 2022-07-10 VITALS — BP 110/68 | HR 66 | Temp 98.7°F | Resp 16 | Ht 67.0 in | Wt 178.0 lb

## 2022-07-10 DIAGNOSIS — E785 Hyperlipidemia, unspecified: Secondary | ICD-10-CM

## 2022-07-10 DIAGNOSIS — R739 Hyperglycemia, unspecified: Secondary | ICD-10-CM

## 2022-07-10 DIAGNOSIS — Z125 Encounter for screening for malignant neoplasm of prostate: Secondary | ICD-10-CM

## 2022-07-10 DIAGNOSIS — Z0000 Encounter for general adult medical examination without abnormal findings: Secondary | ICD-10-CM | POA: Diagnosis not present

## 2022-07-10 DIAGNOSIS — Z955 Presence of coronary angioplasty implant and graft: Secondary | ICD-10-CM

## 2022-07-10 LAB — COMPREHENSIVE METABOLIC PANEL
ALT: 26 U/L (ref 0–53)
AST: 25 U/L (ref 0–37)
Albumin: 4.4 g/dL (ref 3.5–5.2)
Alkaline Phosphatase: 60 U/L (ref 39–117)
BUN: 16 mg/dL (ref 6–23)
CO2: 27 mEq/L (ref 19–32)
Calcium: 9.3 mg/dL (ref 8.4–10.5)
Chloride: 105 mEq/L (ref 96–112)
Creatinine, Ser: 0.98 mg/dL (ref 0.40–1.50)
GFR: 83.99 mL/min (ref >60.00)
Glucose, Bld: 92 mg/dL (ref 70–99)
Potassium: 4.2 mEq/L (ref 3.5–5.1)
Sodium: 139 mEq/L (ref 135–145)
Total Bilirubin: 1.9 mg/dL — ABNORMAL HIGH (ref 0.2–1.2)
Total Protein: 6.9 g/dL (ref 6.0–8.3)

## 2022-07-10 LAB — LIPID PANEL
Cholesterol: 158 mg/dL (ref 0–200)
HDL: 47.3 mg/dL (ref >39.00)
LDL Cholesterol: 90 mg/dL (ref 0–99)
NonHDL: 110.83
Total CHOL/HDL Ratio: 3
Triglycerides: 105 mg/dL (ref 0.0–149.0)
VLDL: 21 mg/dL (ref 0.0–40.0)

## 2022-07-10 LAB — PSA: PSA: 1.18 ng/mL (ref 0.10–4.00)

## 2022-07-10 LAB — HEMOGLOBIN A1C: Hgb A1c MFr Bld: 5.8 % (ref 4.6–6.5)

## 2022-07-10 NOTE — Patient Instructions (Addendum)
You can try metamucil or citrucel fiber supplement to see if that helps more regular bowels and bloating. Keep follow up with stomach specialist, heart doctor and lung specialist as planned.   Call eye specialist for appointment.   No med changes for now. If any concerns on labs I will let you know.   Keeping you healthy  Get these tests Blood pressure- Have your blood pressure checked once a year by your healthcare provider.  Normal blood pressure is 120/80 Weight- Have your body mass index (BMI) calculated to screen for obesity.  BMI is a measure of body fat based on height and weight. You can also calculate your own BMI at [email or web address]. Cholesterol- Have your cholesterol checked every year. Diabetes- Have your blood sugar checked regularly if you have high blood pressure, high cholesterol, have a family history of diabetes or if you are overweight. Screening for Colon Cancer- Colonoscopy starting at age 32.  Screening may begin sooner depending on your family history and other health conditions. Follow up colonoscopy as directed by your Gastroenterologist. Screening for Prostate Cancer- Both blood work (PSA) and a rectal exam help screen for Prostate Cancer.  Screening begins at age 53 with African-American men and at age 71 with Caucasian men.  Screening may begin sooner depending on your family history.  Take these medicines Aspirin- One aspirin daily can help prevent Heart disease and Stroke. Flu shot- Every fall. Tetanus- Every 10 years. Zostavax- Once after the age of 85 to prevent Shingles. Pneumonia shot- Once after the age of 4; if you are younger than 53, ask your healthcare provider if you need a Pneumonia shot.  Take these steps Don't smoke- If you do smoke, talk to your doctor about quitting.  For tips on how to quit, go to www.smokefree.gov or call 1-800-QUIT-NOW. Be physically active- Exercise 5 days a week for at least 30 minutes.  If you are not already  physically active start slow and gradually work up to 30 minutes of moderate physical activity.  Examples of moderate activity include walking briskly, mowing the yard, dancing, swimming, bicycling, etc. Eat a healthy diet- Eat a variety of healthy food such as fruits, vegetables, low fat milk, low fat cheese, yogurt, lean meant, poultry, fish, beans, tofu, etc. For more information go to www.thenutritionsource.org Drink alcohol in moderation- Limit alcohol intake to less than two drinks a day. Never drink and drive. Dentist- Brush and floss twice daily; visit your dentist twice a year. Depression- Your emotional health is as important as your physical health. If you're feeling down, or losing interest in things you would normally enjoy please talk to your healthcare provider. Eye exam- Visit your eye doctor every year. Safe sex- If you may be exposed to a sexually transmitted infection, use a condom. Seat belts- Seat belts can save your life; always wear one. Smoke/Carbon Monoxide detectors- These detectors need to be installed on the appropriate level of your home.  Replace batteries at least once a year. Skin cancer- When out in the sun, cover up and use sunscreen 15 SPF or higher. Violence- If anyone is threatening you, please tell your healthcare provider. Living Will/ Health care power of attorney- Speak with your healthcare provider and family.

## 2022-07-10 NOTE — Progress Notes (Signed)
Subjective:  Patient ID: Todd [name], male    DOB: [birth year]  Age: 62 y.o. MRN: [id number]  CC:  Chief Complaint  Patient presents with   Annual Exam    Pt has no concerns     HPI Todd [name] presents for Annual Exam  Seen March 11 for concerns of shakiness, early satiety.  Regular meals discussed with snacks throughout the day and increase fluid intake.  We also discussed avoidance of FODMAPs with abdominal bloating and early satiety, was referred to gastroenterology, weight was overall stable when comparing to last year. Better after completion of fast 1 week ago. Shaking has resolved. Some bloating in the morning, but better. BM every day, but not hard stools. Had been up to every 4=5 days.   Cardiology, Dr. Rennis Golden, appointment on the 22nd.  History of CAD, hospitalized April 2023 with DES to mid LAD, dual antiplatelet therapy for 1 year, and on beta-blocker/statin. Gastroenterology appointment on May 2nd, Dr. Aldean Ast, prior thought of possible anxiety component, has been prescribed Xanax temporarily. Pulmonary appointment May 20th, history of dyspnea on exertion. Breathing better.       07/10/2022    1:27 PM 05/07/2022   10:53 AM 02/06/2022   11:41 AM 07/15/2021   10:54 AM 05/13/2021    8:43 AM  Depression screen PHQ 2/9  Decreased Interest 0 0 1 0 0  Down, Depressed, Hopeless 0 0 1 0 0  PHQ - 2 Score 0 0 2 0 0  Altered sleeping 0 0 0 0   Tired, decreased energy 0 0 0 0   Change in appetite 0 0 0 0   Feeling bad or failure about yourself  0 0 0 0   Trouble concentrating 0 0 0 0   Moving slowly or fidgety/restless 0 0 0 0   Suicidal thoughts 0 0 0 0   PHQ-9 Score 0 0 2 0   Difficult doing work/chores Not difficult at all   Not difficult at all     Health Maintenance  Topic Date Due   DTaP/Tdap/Td (1 - Tdap) Never done   Zoster Vaccines- Shingrix (1 of 2) Never done   COVID-19 Vaccine (3 - 2023-24 season) 11/22/2021   Hepatitis C Screening  02/07/2023 (Originally  04/24/1978)   INFLUENZA VACCINE  10/23/2022   COLONOSCOPY (Pts 45-8yrs Insurance coverage will need to be confirmed)  07/03/2029   HIV Screening  Completed   HPV VACCINES  Aged Out  Colonoscopy 07/04/19 - repeat 10 years.  Prostate: does have family history of prostate cancer The natural history of prostate cancer and ongoing controversy regarding screening and potential treatment outcomes of prostate cancer has been discussed with the patient. The meaning of a false positive PSA and a false negative PSA has been discussed. He indicates understanding of the limitations of this screening test and wishes  to proceed with screening PSA testing. Lab Results  Component Value Date   PSA1 1.2 09/19/2019   PSA 1.00 08/12/2021   PSA 1.00 06/17/2014    Immunization History  Administered Date(s) Administered   PFIZER Comirnaty(Gray Top)Covid-19 Tri-Sucrose Vaccine 08/06/2019, 08/27/2019  Declines shingrix, covid booster, tdap, rsv vaccine.   No results found. Glasses - last optho visit 1.5- 2 years ago.   Dental:Yes - back home - once a year or so.   Alcohol:none  Tobacco: none  Exercise: 20 min walk per day. No resistance exercise.   Fasting today.   Chronic left hearing loss since age 89.  Glucose 101 on 05/19/22.  Lab Results  Component Value Date   HGBA1C 5.5 08/12/2021      History Patient Active Problem List   Diagnosis Date Noted   Hearing loss of left ear 06/02/2022   Chronic anticoagulation 08/26/2021   Internal hemorrhoid, bleeding 08/26/2021   Prolapsed internal hemorrhoids, grade 3 08/26/2021   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 coronary artery disease 08/26/2021   NSTEMI (non-ST elevated myocardial infarction) 07/20/2021   CAD (coronary artery disease) 07/18/2021   Hyperlipidemia 07/18/2021   Unstable angina    Non-recurrent acute serous otitis media of right ear 10/14/2018   Shortness of breath 11/12/2016   Chest pain  11/12/2016   Heart palpitations 10/20/2013   Situational anxiety 10/20/2013   Migraine with visual aura 08/24/2013   TIA (transient ischemic attack) 08/23/2013   Blurred vision 08/23/2013   Anomia 08/23/2013   Past Medical History:  Diagnosis Date   CAD (coronary artery disease)    Complicated migraine    Deafness in left ear    Diverticulosis    External hemorrhoids    Headache    Hypertension    Internal hemorrhoids    Palpitations    Stroke    TIA (transient ischemic attack)    Tubular adenoma of colon    Vision abnormalities    Past Surgical History:  Procedure Laterality Date   APPENDECTOMY  12/01/11   lap appy   CORONARY STENT INTERVENTION N/A 07/17/2021   Procedure: CORONARY STENT INTERVENTION;  Surgeon: Kathleene Hazel, MD;  Location: [facility];  Service: Cardiovascular;  Laterality: N/A;   LAPAROSCOPIC APPENDECTOMY  12/02/2011   Procedure: APPENDECTOMY LAPAROSCOPIC;  Surgeon: Lodema Pilot, DO;  Location: [facility];  Service: General;  Laterality: N/A;   LEFT HEART CATH AND CORONARY ANGIOGRAPHY N/A 07/17/2021   Procedure: LEFT HEART CATH AND CORONARY ANGIOGRAPHY;  Surgeon: Kathleene Hazel, MD;  Location: [facility];  Service: Cardiovascular;  Laterality: N/A;   Allergies  Allergen Reactions   Wellbutrin [Bupropion Hcl] Itching and Rash   Prior to Admission medications   Medication Sig Start Date End Date Taking? Authorizing Provider  aspirin EC 81 MG tablet Take 81 mg by mouth daily. Swallow whole.   Yes [provider]  benzonatate (TESSALON) 100 MG capsule Take 1 capsule (100 mg total) by mouth 3 (three) times daily as needed for cough. 06/09/22  Yes Zenia Resides, MD  clopidogrel (PLAVIX) 75 MG tablet TAKE 1 TABLET BY MOUTH DAILY WITH BREAKFAST. 07/07/22  Yes Hilty, Lisette Abu, MD  clotrimazole-betamethasone (LOTRISONE) cream Apply 1 Application topically daily. 02/06/22  Yes Shade Flood, MD  levocetirizine Elita Boone) 5 MG  tablet Take by mouth. 10/12/18  Yes [provider]  metoCLOPramide (REGLAN) 10 MG tablet Take 1 tablet (10 mg total) by mouth every 8 (eight) hours as needed for nausea. 05/22/22  Yes Olalere, Adewale A, MD  metoprolol succinate (TOPROL XL) 25 MG 24 hr tablet Take 1 tablet (25 mg total) by mouth daily. 10/10/21  Yes Marjie Skiff E, PA-C  rosuvastatin (CRESTOR) 20 MG tablet Take 1 tablet (20 mg total) by mouth daily. 05/22/22  Yes Hilty, Lisette Abu, MD  fluticasone (FLONASE) 50 MCG/ACT nasal spray 2 sprays Once Daily. Patient not taking: Reported on 06/02/2022 10/14/18   [provider]  hydrocortisone (ANUSOL-HC) 25 MG suppository Place 1 suppository (25 mg total) rectally 2 (two) times daily. As needed with flare. Patient not taking:  Reported on 07/10/2022 02/06/22   Shade Flood, MD  nitroGLYCERIN (NITROSTAT) 0.4 MG SL tablet Place 1 tablet (0.4 mg total) under the tongue every 5 (five) minutes as needed for chest pain. 07/12/21 11/04/21  Alver Sorrow, NP  traMADol (ULTRAM) 50 MG tablet Take 1 tablet (50 mg total) by mouth every 6 (six) hours as needed (pain). Patient not taking: Reported on 07/10/2022 06/09/22   Zenia Resides, MD   Social History   Socioeconomic History   Marital status: Divorced    Spouse name: Not on file   Number of children: Not on file   Years of education: Not on file   Highest education level: Not on file  Occupational History   Occupation: part time  Tobacco Use   Smoking status: Never   Smokeless tobacco: Never  Vaping Use   Vaping Use: Never used  Substance and Sexual Activity   Alcohol use: No   Drug use: No   Sexual activity: Not on file  Other Topics Concern   Not on file  Social History Narrative   Lives alone   Right Handed   Drinks very little caffeine   Social Determinants of Health   Financial Resource Strain: Not on file  Food Insecurity: Not on file  Transportation Needs: Not on file  Physical Activity: Not  on file  Stress: Not on file  Social Connections: Not on file  Intimate Partner Violence: Not on file    Review of Systems  13 point review of systems per patient health survey noted.  Negative other than as indicated above or in HPI.   Objective:   Vitals:   07/10/22 1324  BP: 110/68  Pulse: 66  Resp: 16  Temp: 98.7 F (37.1 C)  TempSrc: Temporal  SpO2: 97%  Weight: 178 lb (80.7 kg)  Height: 5\' 7"  (1.702 m)     Physical Exam Vitals reviewed.  Constitutional:      Appearance: He is well-developed.  HENT:     Head: Normocephalic and atraumatic.     Right Ear: External ear normal.     Left Ear: External ear normal.  Eyes:     Conjunctiva/sclera: Conjunctivae normal.     Pupils: Pupils are equal, round, and reactive to light.  Neck:     Thyroid: No thyromegaly.  Cardiovascular:     Rate and Rhythm: Normal rate and regular rhythm.     Heart sounds: Normal heart sounds.  Pulmonary:     Effort: Pulmonary effort is normal. No respiratory distress.     Breath sounds: Normal breath sounds. No wheezing.  Abdominal:     General: There is no distension.     Palpations: Abdomen is soft.     Tenderness: There is no abdominal tenderness.  Musculoskeletal:        General: No tenderness. Normal range of motion.     Cervical back: Normal range of motion and neck supple.  Lymphadenopathy:     Cervical: No cervical adenopathy.  Skin:    General: Skin is warm and dry.  Neurological:     Mental Status: He is alert and oriented to person, place, and time.     Deep Tendon Reflexes: Reflexes are normal and symmetric.  Psychiatric:        Behavior: Behavior normal.        Assessment & Plan:  Cottrell Gentles is a 62 y.o. male . Annual physical exam  S/P drug eluting coronary stent placement  Hyperlipidemia, unspecified  hyperlipidemia type - Plan: Comprehensive metabolic panel, Lipid panel  Hyperglycemia - Plan: Hemoglobin A1c  Screening for prostate cancer - Plan:  PSA -anticipatory guidance as below in AVS, screening labs above. Health maintenance items as above in HPI discussed/recommended as applicable.  -Check A1c with prior borderline hyperglycemia, check lipids for management/monitoring of current meds, history of CAD, and continue follow-up with specialist as planned including upcoming cardiology visit. -Bloating has improved but still some intermittent symptoms.  With bowel movements every other day could still have component of constipation, recommended low-dose fiber supplement like Citrucel or Metamucil over-the-counter until he is able to see gastroenterology as planned.  RTC precautions given.  No orders of the defined types were placed in this encounter.  Patient Instructions  You can try metamucil or citrucel fiber supplement to see if that helps more regular bowels and bloating. Keep follow up with stomach specialist, heart doctor and lung specialist as planned.   Call eye specialist for appointment.   No med changes for now. If any concerns on labs I will let you know.   Keeping you healthy  Get these tests Blood pressure- Have your blood pressure checked once a year by your healthcare provider.  Normal blood pressure is 120/80 Weight- Have your body mass index (BMI) calculated to screen for obesity.  BMI is a measure of body fat based on height and weight. You can also calculate your own BMI at [email or web address]. Cholesterol- Have your cholesterol checked every year. Diabetes- Have your blood sugar checked regularly if you have high blood pressure, high cholesterol, have a family history of diabetes or if you are overweight. Screening for Colon Cancer- Colonoscopy starting at age 58.  Screening may begin sooner depending on your family history and other health conditions. Follow up colonoscopy as directed by your Gastroenterologist. Screening for Prostate Cancer- Both blood work (PSA) and a rectal exam help screen for Prostate  Cancer.  Screening begins at age 67 with African-American men and at age 16 with Caucasian men.  Screening may begin sooner depending on your family history.  Take these medicines Aspirin- One aspirin daily can help prevent Heart disease and Stroke. Flu shot- Every fall. Tetanus- Every 10 years. Zostavax- Once after the age of 62 to prevent Shingles. Pneumonia shot- Once after the age of 35; if you are younger than 57, ask your healthcare provider if you need a Pneumonia shot.  Take these steps Don't smoke- If you do smoke, talk to your doctor about quitting.  For tips on how to quit, go to www.smokefree.gov or call 1-800-QUIT-NOW. Be physically active- Exercise 5 days a week for at least 30 minutes.  If you are not already physically active start slow and gradually work up to 30 minutes of moderate physical activity.  Examples of moderate activity include walking briskly, mowing the yard, dancing, swimming, bicycling, etc. Eat a healthy diet- Eat a variety of healthy food such as fruits, vegetables, low fat milk, low fat cheese, yogurt, lean meant, poultry, fish, beans, tofu, etc. For more information go to www.thenutritionsource.org Drink alcohol in moderation- Limit alcohol intake to less than two drinks a day. Never drink and drive. Dentist- Brush and floss twice daily; visit your dentist twice a year. Depression- Your emotional health is as important as your physical health. If you're feeling down, or losing interest in things you would normally enjoy please talk to your healthcare provider. Eye exam- Visit your eye doctor every year. Safe sex- If you may be exposed  to a sexually transmitted infection, use a condom. Seat belts- Seat belts can save your life; always wear one. Smoke/Carbon Monoxide detectors- These detectors need to be installed on the appropriate level of your home.  Replace batteries at least once a year. Skin cancer- When out in the sun, cover up and use sunscreen 15 SPF or  higher. Violence- If anyone is threatening you, please tell your healthcare provider. Living Will/ Health care power of attorney- Speak with your healthcare provider and family.    Signed,   Meredith Staggers, MD [facility], [facility] 07/10/22 2:02 PM

## 2022-07-14 ENCOUNTER — Ambulatory Visit: Payer: Commercial Managed Care - HMO | Attending: Internal Medicine | Admitting: Internal Medicine

## 2022-07-14 VITALS — BP 100/64 | HR 67 | Ht 67.0 in | Wt 175.0 lb

## 2022-07-14 DIAGNOSIS — I25118 Atherosclerotic heart disease of native coronary artery with other forms of angina pectoris: Secondary | ICD-10-CM | POA: Diagnosis not present

## 2022-07-14 DIAGNOSIS — Z955 Presence of coronary angioplasty implant and graft: Secondary | ICD-10-CM | POA: Diagnosis not present

## 2022-07-14 DIAGNOSIS — E785 Hyperlipidemia, unspecified: Secondary | ICD-10-CM | POA: Diagnosis not present

## 2022-07-14 MED ORDER — METOPROLOL SUCCINATE ER 25 MG PO TB24: 25.0000 mg | ORAL_TABLET | Freq: Every day | ORAL | 3 refills | Status: DC

## 2022-07-14 NOTE — Progress Notes (Signed)
OFFICE NOTE  Chief Complaint:  Follow-up DOE, palpitations  Primary Care Physician: Shade Flood, MD  HPI:  Todd [name] is a 62 year old male who was previously seen at [facility] by Dr. Lynnea Ferrier. He underwent a stress test, echocardiogram and monitor which were unrevealing. He was told he was well and did [name] need to follow back up with [place]. He recently had an episode where he had some visual abnormalities including seeing white spots. He presented to the emergency room and there was concern for TIA or stroke. Workup was generally unremarkable except for some cerebral atherosclerosis. It was also considered that he might have had a migraine although he had no headache and has no history of headaches. He currently denies any chest pain although does get some mild shortness of breath with exertion. 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 He does have a family history of heart disease in his father who apparently had an enlarged heart and fluid around the heart. He also has a history of anxiety and was briefly a medication for anxiety and depression in the past. He takes Lipitor for elevated LDL cholesterol recently which was 130.  I saw Todd [name] back in the office today. He denies any worsening palpitations. He was in the emergency room apparently this morning with blurred vision and seeing spots. He also had a headache on the right side of his head. He left apparently before being fully evaluated. He's had episodes like this before. There is question is whether this could be complicated migraine or TIA. He denies any chest pain or worsening shortness of breath.  11/12/2016  Todd [name] was seen today in follow-up. He was recently seen in the ER for chest discomfort. He says he occasionally gets some palpitations which are short-lived and chest discomfort. He's also been having visual changes. That is typically  followed by headache. He underwent neurology evaluation in the past for this as previously described in my notes. MRI showed some cerebral atherosclerosis but was felt to have no evidence of stroke. It was thought that his symptoms are related to migraine. He reports chronic stress and running his own business. She's [name] currently on medications. He was prescribed some medications for migraines ever since his symptoms improved he stopped taking them and did [name] follow-up with the neurologist. With regard to the chest pain it is mostly sharp, [name] necessarily associated with exertion or relieved by rest. Sometimes associated with palpitations.   01/04/2021  Todd [name] is seen today in follow-up.  He is considered a new patient today since he has [name] been seen in the past 3 years.  He is again having some palpitations.  They occur fairly sporadically but sometimes can be more intense.  We talked a lot about his diet.  He said he does [name] drink often but he drinks green tea.  There is between 30 to 50 mg of caffeine and every cup of green tea and therefore if he could cut that back or switch to decaffeinated it may be helpful.  He may ultimately need low-dose beta-blocker.  He is also been experiencing shortness of breath per tickly walking upstairs.  This is worsened recently.  He has no history of coronary disease although lipids in August showed significant elevation.  He says his diet is pretty unregulated, meaning he eats what ever he can.  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  He recently started on 5 mg rosuvastatin for this.  Total cholesterol was 238, HDL 46, triglycerides 123 and LDL 167.  07/31/2021  Todd [name] returns today for follow-up.  He was recently seen a number of times by Gillian Shields, NP for chest pressure.  Ultimately he underwent coronary CT angiography which showed a calcium score of 370, 45th percentile for age and sex matched control, however he  was noted to have moderate stenosis in the mid LAD.  The study was sent for [facility] which suggested flow-limiting stenosis in the mid to distal LAD.  Subsequently he was sent for cardiac catheterization and was found to have an 80% focal stenosis of the mid LAD.  He also had 30% proximal RCA and 30% first marginal stenoses.  He underwent a drug-eluting stent to the LAD which reduced the stenosis to 0.  He was advised to stay on aspirin and Plavix for minimum of 6 months and his rosuvastatin was increased to 20 mg daily.  Subsequently he has had numerous emergency department visits.  He reports his chest pressure was resolved and although he had a little bit of shortness of breath when leaving the hospital that improved as well after a few days.  He has subsequently had numerous episodes of sharp chest pain.  This is described as stabbing and along the left sternal wall.  He also has some discomfort in over the left trapezius area.  These episodes occur on a daily basis, sometimes wake him up at night or in the morning but occurred for example today in the afternoon just prior to his visit.  He is noted occasional low blood pressure with systolic blood pressures in the 90s however was seen this morning by pulmonary and blood pressure was normal.  Blood pressure today is 110/65.  He is on a low-dose of Toprol but no other medications for blood pressure.  11/04/2021  Todd [name] returns today for follow-up.  He reports he is actually doing quite a bit better.  He gets very infrequent palpitations that only last for a few seconds.  He shortness of breath is improved and his chest pain is gone.  He has had some hemorrhoidal bleeding.  He is change his diet notes better more regular bowel movements with less constipation.  He has discussed with GI about possible surgery however they are waiting on that.  From a cardiac standpoint he would be able to stop the clopidogrel for 5 days as of October 2023.  Preferably, would like  to restart it and have him continue the clopidogrel and aspirin until 1 year or April 2024.  He did have lipids in May which showed an elevated LDL of 135, however he had taken a holiday off the statin.  He reported that that did [name] actually change his symptoms so he started taking the medicine again.  He will need repeat lipid testing in a few months.  07/14/2022  Todd [name] is seen today in follow-up.  He denies any recurrent angina.  He has had some palpitations but he says these have been going on for years.  Cholesterol is climbing somewhat with LDL recently at 90.  He understands that his target is less than 70 and he will need to continue to work on that.  He is now a year out from stenting and from my standpoint could stop his clopidogrel.  PMHx:  Past Medical History:  Diagnosis Date  CAD (coronary artery disease)    Complicated migraine    Deafness in left ear    Diverticulosis    External hemorrhoids    Headache    Hypertension    Internal hemorrhoids    Palpitations    Stroke    TIA (transient ischemic attack)    Tubular adenoma of colon    Vision abnormalities     Past Surgical History:  Procedure Laterality Date   APPENDECTOMY  12/01/11   lap appy   CORONARY STENT INTERVENTION N/A 07/17/2021   Procedure: CORONARY STENT INTERVENTION;  Surgeon: Kathleene Hazel, MD;  Location: [facility];  Service: Cardiovascular;  Laterality: N/A;   LAPAROSCOPIC APPENDECTOMY  12/02/2011   Procedure: APPENDECTOMY LAPAROSCOPIC;  Surgeon: Lodema Pilot, DO;  Location: [facility];  Service: General;  Laterality: N/A;   LEFT HEART CATH AND CORONARY ANGIOGRAPHY N/A 07/17/2021   Procedure: LEFT HEART CATH AND CORONARY ANGIOGRAPHY;  Surgeon: Kathleene Hazel, MD;  Location: [facility];  Service: Cardiovascular;  Laterality: N/A;    FAMHx:  Family History  Problem Relation Age of Onset   Hypertension Mother    Hypertension Father    Anemia Neg Hx    Arrhythmia Neg Hx     Asthma Neg Hx    Clotting disorder Neg Hx    Fainting Neg Hx    Heart attack Neg Hx    Heart disease Neg Hx    Heart failure Neg Hx    Hyperlipidemia Neg Hx    Colon cancer Neg Hx    Esophageal cancer Neg Hx    Stomach cancer Neg Hx    Rectal cancer Neg Hx     SOCHx:   reports that he has never smoked. He has never used smokeless tobacco. He reports that he does [name] drink alcohol and does [name] use drugs.  ALLERGIES:  Allergies  Allergen Reactions   Wellbutrin [Bupropion Hcl] Itching and Rash    ROS: Pertinent items noted in HPI and remainder of comprehensive ROS otherwise negative.  HOME MEDS: Current Outpatient Medications  Medication Sig Dispense Refill   aspirin EC 81 MG tablet Take 81 mg by mouth daily. Swallow whole.     clopidogrel (PLAVIX) 75 MG tablet TAKE 1 TABLET BY MOUTH DAILY WITH BREAKFAST. 30 tablet 4   metoprolol succinate (TOPROL XL) 25 MG 24 hr tablet Take 1 tablet (25 mg total) by mouth daily. 90 tablet 2   rosuvastatin (CRESTOR) 20 MG tablet Take 1 tablet (20 mg total) by mouth daily. 30 tablet 5   benzonatate (TESSALON) 100 MG capsule Take 1 capsule (100 mg total) by mouth 3 (three) times daily as needed for cough. (Patient [name] taking: Reported on 07/14/2022) 21 capsule 0   clotrimazole-betamethasone (LOTRISONE) cream Apply 1 Application topically daily. (Patient [name] taking: Reported on 07/14/2022) 45 g 1   levocetirizine (XYZAL) 5 MG tablet Take by mouth. (Patient [name] taking: Reported on 07/14/2022)     metoCLOPramide (REGLAN) 10 MG tablet Take 1 tablet (10 mg total) by mouth every 8 (eight) hours as needed for nausea. (Patient [name] taking: Reported on 07/14/2022) 60 tablet 1   nitroGLYCERIN (NITROSTAT) 0.4 MG SL tablet Place 1 tablet (0.4 mg total) under the tongue every 5 (five) minutes as needed for chest pain. 25 tablet 3   No current facility-administered medications for this visit.    LABS/IMAGING: No results found for this or any previous visit  (from the past 48 hour(s)). No results found.  VITALS: BP 100/64 (BP Location: Left Arm, Patient Position: Sitting, Cuff Size: Normal)   Pulse 67   Ht  (1.702 m)   Wt 175 lb (79.4 kg)   SpO2 98%   BMI 27.41 kg/m   EXAM: General appearance: alert and no distress Neck: no carotid bruit and no JVD Lungs: clear to auscultation bilaterally Heart: regular rate and rhythm, S1, S2 normal, no murmur, click, rub or gallop Abdomen: soft, non-tender; bowel sounds normal; no masses,  no organomegaly Extremities: extremities normal, atraumatic, no cyanosis or edema Pulses: 2+ and symmetric Skin: Skin color, texture, turgor normal. No rashes or lesions Neurologic: Grossly normal Psych: Mildly anxious  EKG: N/A  ASSESSMENT: Coronary artery disease status post PCI to the mid LAD with DES (06/2021) Abnormal coronary CT with CAC score of 370, 45th percentile for age and sex matched control DOE Palpitations Visual changes with headache - suspect migraine Mild dyspnea with exertion Atherosclerosis/dyslipidemia Anxiety   PLAN: 1.   Mr. Delisle denies any chest pain or shortness of breath.  He can stop his clopidogrel but should remain on low-dose aspirin 81 mg daily indefinitely.  He has had some palpitations and I encouraged him to get a smart watch to monitor these.  His cholesterol is gone up slightly and he may need additional therapy if he is [name] able to keep it below 70.  Otherwise no changes to his meds today.  Plan follow-up with me annually or sooner as necessary.  Chrystie Nose, MD, [facility], FACP  [place]  [facility]  Medical Director of the [facility]  [facility] Diplomate of the American Board of Clinical Lipidology Attending Cardiologist  Direct Dial: [phone]  Fax: [phone]  Website:  [email or web address]   Chrystie Nose 07/14/2022, 2:57 PM

## 2022-07-14 NOTE — Patient Instructions (Signed)
Medication Instructions:  STOP clopidogrel (plavix)  *If you need a refill on your cardiac medications before your next appointment, please call your pharmacy*   Follow-Up: At [facility], you and your health needs are our priority.  As part of our continuing mission to provide you with exceptional heart care, we have created designated Provider Care Teams.  These Care Teams include your primary Cardiologist (physician) and Advanced Practice Providers (APPs -  Physician Assistants and Nurse Practitioners) who all work together to provide you with the care you need, when you need it.  We recommend signing up for the patient portal called "MyChart".  Sign up information is provided on this After Visit Summary.  MyChart is used to connect with patients for Virtual Visits (Telemedicine).  Patients are able to view lab/test results, encounter notes, upcoming appointments, etc.  Non-urgent messages can be sent to your provider as well.   To learn more about what you can do with MyChart, go to [email or web address].    Your next appointment:   12 months with Dr. Rennis Golden   Other Instructions Dr. Rennis Golden suggested a heart rate monitor - FitBit, AppleWatch, KardiaMobile by [employer] to check your heart rate at home since you have had palpitations

## 2022-07-22 ENCOUNTER — Emergency Department (HOSPITAL_COMMUNITY): Payer: Commercial Managed Care - HMO

## 2022-07-22 ENCOUNTER — Other Ambulatory Visit: Payer: Self-pay

## 2022-07-22 ENCOUNTER — Emergency Department (HOSPITAL_COMMUNITY)
Admission: EM | Admit: 2022-07-22 | Discharge: 2022-07-22 | Disposition: A | Discharge status: Home / Self Care | Payer: Commercial Managed Care - HMO | Attending: Emergency Medicine | Admitting: Emergency Medicine

## 2022-07-22 DIAGNOSIS — I2510 Atherosclerotic heart disease of native coronary artery without angina pectoris: Secondary | ICD-10-CM | POA: Insufficient documentation

## 2022-07-22 DIAGNOSIS — R072 Precordial pain: Secondary | ICD-10-CM | POA: Diagnosis not present

## 2022-07-22 DIAGNOSIS — R519 Headache, unspecified: Secondary | ICD-10-CM | POA: Insufficient documentation

## 2022-07-22 DIAGNOSIS — Z7982 Long term (current) use of aspirin: Secondary | ICD-10-CM | POA: Diagnosis not present

## 2022-07-22 DIAGNOSIS — R079 Chest pain, unspecified: Secondary | ICD-10-CM | POA: Diagnosis present

## 2022-07-22 DIAGNOSIS — I10 Essential (primary) hypertension: Secondary | ICD-10-CM | POA: Insufficient documentation

## 2022-07-22 DIAGNOSIS — Z8673 Personal history of transient ischemic attack (TIA), and cerebral infarction without residual deficits: Secondary | ICD-10-CM | POA: Insufficient documentation

## 2022-07-22 DIAGNOSIS — Z79899 Other long term (current) drug therapy: Secondary | ICD-10-CM | POA: Insufficient documentation

## 2022-07-22 LAB — CBC WITH DIFFERENTIAL/PLATELET
Abs Immature Granulocytes: 0.06 10*3/uL (ref 0.00–0.07)
Basophils Absolute: 0.1 10*3/uL (ref 0.0–0.1)
Basophils Relative: 1 %
Eosinophils Absolute: 0.2 10*3/uL (ref 0.0–0.5)
Eosinophils Relative: 2 %
HCT: 42.4 % (ref 39.0–52.0)
Hemoglobin: 14.5 g/dL (ref 13.0–17.0)
Immature Granulocytes: 1 %
Lymphocytes Relative: 29 %
Lymphs Abs: 2.3 10*3/uL (ref 0.7–4.0)
MCH: 30 pg (ref 26.0–34.0)
MCHC: 34.2 g/dL (ref 30.0–36.0)
MCV: 87.6 fL (ref 80.0–100.0)
Monocytes Absolute: 0.6 10*3/uL (ref 0.1–1.0)
Monocytes Relative: 8 %
Neutro Abs: 4.8 10*3/uL (ref 1.7–7.7)
Neutrophils Relative %: 59 %
Platelets: 225 10*3/uL (ref 150–400)
RBC: 4.84 MIL/uL (ref 4.22–5.81)
RDW: 12.3 % (ref 11.5–15.5)
WBC: 8 10*3/uL (ref 4.0–10.5)
nRBC: 0 % (ref 0.0–0.2)

## 2022-07-22 LAB — BASIC METABOLIC PANEL
Anion gap: 6 (ref 5–15)
BUN: 16 mg/dL (ref 6–20)
CO2: 25 mmol/L (ref 22–32)
Calcium: 8.8 mg/dL — ABNORMAL LOW (ref 8.9–10.3)
Chloride: 105 mmol/L (ref 98–111)
Creatinine, Ser: 1 mg/dL (ref 0.61–1.24)
GFR, Estimated: >60 mL/min (ref >60)
Glucose, Bld: 105 mg/dL — ABNORMAL HIGH (ref 70–99)
Potassium: 3.8 mmol/L (ref 3.5–5.1)
Sodium: 136 mmol/L (ref 135–145)

## 2022-07-22 LAB — TROPONIN I (HIGH SENSITIVITY)
Troponin I (High Sensitivity): <2 ng/L (ref <18)
Troponin I (High Sensitivity): <2 ng/L (ref <18)

## 2022-07-22 MED ORDER — SUCRALFATE 1 GM/10ML PO SUSP: 1.0000 g | Freq: Three times a day (TID) | ORAL | 0 refills | Status: DC

## 2022-07-22 MED ORDER — ALUM & MAG HYDROXIDE-SIMETH 200-200-20 MG/5ML PO SUSP
30.0000 mL | Freq: Once | ORAL | Status: AC
  Administered 2022-07-22: 30 mL via ORAL
  Filled 2022-07-22: qty 30

## 2022-07-22 NOTE — ED Triage Notes (Signed)
Pt arrives c/o non radiating L sided CP that started at rest 2 hours ago. Pt also states that he was also having palpitations, dizzy/lightheadedness and SOB at that time, but denies those symptoms upon arrival. Slight chest pressure remains. Pt also c/o headache and photosensitivity. Hx of TIA - on thinner, but took last dose yesterday per doctors orders since it's been a year since TIA. Saw cardiology a few days ago for routine visit.

## 2022-07-22 NOTE — ED Provider Notes (Signed)
[facility] DEPARTMENT AT [facility] Provider Note   CSN: [id number] Arrival date & time: 07/22/22  0129     History  Chief Complaint  Patient presents with   Chest Pain   Palpitations   Headache    Todd [name] is a 62 y.o. male.  The history is provided by the patient.  Chest Pain Pain location:  Substernal area and L chest Pain quality: dull and pressure   Pain radiates to:  Does not radiate Pain severity:  Moderate Onset quality:  Sudden Duration:  3 hours Timing:  Constant Progression:  Unchanged Chronicity:  New Context: at rest   Context comment:  Ate a cheesteak and fries with mayo Relieved by:  Nothing Worsened by:  Nothing Associated symptoms: headache, palpitations and vomiting   Associated symptoms: no anorexia, no diaphoresis, no fever, no numbness, no shortness of breath and no weakness   Associated symptoms comment:  Had a headache as well that is now gone  Risk factors: male sex   Patient with CAD presents with L CP at rest.  Pressure and dull.  No SOB,  No exertional symptoms.  No n/v/d.  No weakness no numbness no changes in vision or speech.      Past Medical History:  Diagnosis Date   CAD (coronary artery disease)    Complicated migraine    Deafness in left ear    Diverticulosis    External hemorrhoids    Headache    Hypertension    Internal hemorrhoids    Palpitations    Stroke (HCC)    TIA (transient ischemic attack)    Tubular adenoma of colon    Vision abnormalities      Home Medications Prior to Admission medications   Medication Sig Start Date End Date Taking? Authorizing Provider  aspirin EC 81 MG tablet Take 81 mg by mouth daily. Swallow whole.   Yes [provider]  metoprolol succinate (TOPROL XL) 25 MG 24 hr tablet Take 1 tablet (25 mg total) by mouth daily. 07/14/22  Yes Hilty, Lisette Abu, MD  rosuvastatin (CRESTOR) 20 MG tablet Take 1 tablet (20 mg total) by mouth daily. 05/22/22  Yes Hilty,  Lisette Abu, MD  sucralfate (CARAFATE) 1 GM/10ML suspension Take 10 mLs (1 g total) by mouth 4 (four) times daily -  with meals and at bedtime. 07/22/22  Yes Charisa Twitty, MD  benzonatate (TESSALON) 100 MG capsule Take 1 capsule (100 mg total) by mouth 3 (three) times daily as needed for cough. Patient not taking: Reported on 07/14/2022 06/09/22   Zenia Resides, MD  clotrimazole-betamethasone (LOTRISONE) cream Apply 1 Application topically daily. Patient not taking: Reported on 07/14/2022 02/06/22   Shade Flood, MD  metoCLOPramide (REGLAN) 10 MG tablet Take 1 tablet (10 mg total) by mouth every 8 (eight) hours as needed for nausea. Patient not taking: Reported on 07/14/2022 05/22/22   Tomma Lightning, MD  nitroGLYCERIN (NITROSTAT) 0.4 MG SL tablet Place 1 tablet (0.4 mg total) under the tongue every 5 (five) minutes as needed for chest pain. 07/12/21 11/04/21  Alver Sorrow, NP      Allergies    Wellbutrin [bupropion hcl]    Review of Systems   Review of Systems  Constitutional:  Negative for diaphoresis and fever.  HENT:  Negative for facial swelling.   Respiratory:  Negative for shortness of breath.   Cardiovascular:  Positive for chest pain and palpitations. Negative for leg swelling.  Gastrointestinal:  Positive for vomiting. Negative for anorexia.  Neurological:  Positive for headaches. Negative for speech difficulty, weakness and numbness.  All other systems reviewed and are negative.   Physical Exam Updated Vital Signs BP 120/82   Pulse (!) 53   Temp 98 F (36.7 C) (Oral)   Resp 18   Ht 5\' 7"  (1.702 m)   Wt 78 kg   SpO2 97%   BMI 26.94 kg/m  Physical Exam Vitals and nursing note reviewed.  Constitutional:      General: He is not in acute distress.    Appearance: Normal appearance. He is well-developed. He is not diaphoretic.  HENT:     Head: Normocephalic and atraumatic.     Nose: Nose normal.  Eyes:     Conjunctiva/sclera: Conjunctivae normal.      Pupils: Pupils are equal, round, and reactive to light.  Cardiovascular:     Rate and Rhythm: Normal rate and regular rhythm.     Pulses: Normal pulses.     Heart sounds: Normal heart sounds.     Comments: Having palpitations at the time of my exam.  No irregular or ectopic beats noted  Pulmonary:     Effort: Pulmonary effort is normal.     Breath sounds: Normal breath sounds. No wheezing or rales.  Abdominal:     Palpations: Abdomen is soft.     Tenderness: There is no abdominal tenderness. There is no guarding or rebound.     Comments: Increased bowel sounds   Musculoskeletal:        General: Normal range of motion.     Cervical back: Normal range of motion and neck supple.  Skin:    General: Skin is warm and dry.     Capillary Refill: Capillary refill takes less than 2 seconds.  Neurological:     General: No focal deficit present.     Mental Status: He is alert and oriented to person, place, and time.     Deep Tendon Reflexes: Reflexes normal.  Psychiatric:        Mood and Affect: Mood normal.        Behavior: Behavior normal.     ED Results / Procedures / Treatments   Labs (all labs ordered are listed, but only abnormal results are displayed) Results for orders placed or performed during the hospital encounter of 07/22/22  CBC with Differential  Result Value Ref Range   WBC 8.0 4.0 - 10.5 K/uL   RBC 4.84 4.22 - 5.81 MIL/uL   Hemoglobin 14.5 13.0 - 17.0 g/dL   HCT 11.9 14.7 - 82.9 %   MCV 87.6 80.0 - 100.0 fL   MCH 30.0 26.0 - 34.0 pg   MCHC 34.2 30.0 - 36.0 g/dL   RDW 56.2 13.0 - 86.5 %   Platelets 225 150 - 400 K/uL   nRBC 0.0 0.0 - 0.2 %   Neutrophils Relative % 59 %   Neutro Abs 4.8 1.7 - 7.7 K/uL   Lymphocytes Relative 29 %   Lymphs Abs 2.3 0.7 - 4.0 K/uL   Monocytes Relative 8 %   Monocytes Absolute 0.6 0.1 - 1.0 K/uL   Eosinophils Relative 2 %   Eosinophils Absolute 0.2 0.0 - 0.5 K/uL   Basophils Relative 1 %   Basophils Absolute 0.1 0.0 - 0.1 K/uL    Immature Granulocytes 1 %   Abs Immature Granulocytes 0.06 0.00 - 0.07 K/uL  Basic metabolic panel  Result Value Ref Range   Sodium 136  135 - 145 mmol/L   Potassium 3.8 3.5 - 5.1 mmol/L   Chloride 105 98 - 111 mmol/L   CO2 25 22 - 32 mmol/L   Glucose, Bld 105 (H) 70 - 99 mg/dL   BUN 16 6 - 20 mg/dL   Creatinine, Ser 1.61 0.61 - 1.24 mg/dL   Calcium 8.8 (L) 8.9 - 10.3 mg/dL   GFR, Estimated >09 >60 mL/min   Anion gap 6 5 - 15  Troponin I (High Sensitivity)  Result Value Ref Range   Troponin I (High Sensitivity) <2 <18 ng/L  Troponin I (High Sensitivity)  Result Value Ref Range   Troponin I (High Sensitivity) <2 <18 ng/L   CT Head Wo Contrast  Result Date: 07/22/2022 CLINICAL DATA:  62 year old male with headache for 3 days. Photosensitivity. History of TIA. Chest pain, palpitations. EXAM: CT HEAD WITHOUT CONTRAST TECHNIQUE: Contiguous axial images were obtained from the base of the skull through the vertex without intravenous contrast. RADIATION DOSE REDUCTION: This exam was performed according to the departmental dose-optimization program which includes automated exposure control, adjustment of the mA and/or kV according to patient size and/or use of iterative reconstruction technique. COMPARISON:  Head CT 05/19/2022.  Brain MRI 10/27/2015. FINDINGS: Brain: Cerebral volume is stable and within normal limits for age. No midline shift, ventriculomegaly, mass effect, evidence of mass lesion, intracranial hemorrhage or evidence of cortically based acute infarction. Mega cisterna magna, normal variant. Mild for age scattered bilateral white matter hypodensity is stable from February. No cortical encephalomalacia. Vascular: Calcified atherosclerosis at the skull base. No suspicious intracranial vascular hyperdensity. Skull: No acute osseous abnormality identified. Sinuses/Orbits: Visualized paranasal sinuses and mastoids are stable and well aerated. Other: Visualized orbits and scalp soft tissues  are within normal limits. IMPRESSION: 1. No acute intracranial abnormality. 2. Stable mild for age nonspecific white matter changes, most commonly due to chronic small vessel disease. Electronically Signed   By: Odessa Fleming M.D.   On: 07/22/2022 04:34   DG Chest Portable 1 View  Result Date: 07/22/2022 CLINICAL DATA:  Chest pain and cardiac palpitations EXAM: PORTABLE CHEST 1 VIEW COMPARISON:  05/19/2022 FINDINGS: The heart size and mediastinal contours are within normal limits. Both lungs are clear. The visualized skeletal structures are unremarkable. IMPRESSION: No active disease. Electronically Signed   By: Alcide Clever M.D.   On: 07/22/2022 02:01     EKG EKG Interpretation  Date/Time:  Tuesday Aijalon Demuro 30 2024 01:41:02 EDT Ventricular Rate:  62 PR Interval:  152 QRS Duration: 105 QT Interval:  433 QTC Calculation: 440 R Axis:   65 Text Interpretation: Sinus rhythm Confirmed by Jaymir Struble ([id number]) on 07/22/2022 1:52:26 AM  Radiology CT Head Wo Contrast  Result Date: 07/22/2022 CLINICAL DATA:  62 year old male with headache for 3 days. Photosensitivity. History of TIA. Chest pain, palpitations. EXAM: CT HEAD WITHOUT CONTRAST TECHNIQUE: Contiguous axial images were obtained from the base of the skull through the vertex without intravenous contrast. RADIATION DOSE REDUCTION: This exam was performed according to the departmental dose-optimization program which includes automated exposure control, adjustment of the mA and/or kV according to patient size and/or use of iterative reconstruction technique. COMPARISON:  Head CT 05/19/2022.  Brain MRI 10/27/2015. FINDINGS: Brain: Cerebral volume is stable and within normal limits for age. No midline shift, ventriculomegaly, mass effect, evidence of mass lesion, intracranial hemorrhage or evidence of cortically based acute infarction. Mega cisterna magna, normal variant. Mild for age scattered bilateral white matter hypodensity is stable from February. No  cortical encephalomalacia. Vascular: Calcified atherosclerosis at the skull base. No suspicious intracranial vascular hyperdensity. Skull: No acute osseous abnormality identified. Sinuses/Orbits: Visualized paranasal sinuses and mastoids are stable and well aerated. Other: Visualized orbits and scalp soft tissues are within normal limits. IMPRESSION: 1. No acute intracranial abnormality. 2. Stable mild for age nonspecific white matter changes, most commonly due to chronic small vessel disease. Electronically Signed   By: Odessa Fleming M.D.   On: 07/22/2022 04:34   DG Chest Portable 1 View  Result Date: 07/22/2022 CLINICAL DATA:  Chest pain and cardiac palpitations EXAM: PORTABLE CHEST 1 VIEW COMPARISON:  05/19/2022 FINDINGS: The heart size and mediastinal contours are within normal limits. Both lungs are clear. The visualized skeletal structures are unremarkable. IMPRESSION: No active disease. Electronically Signed   By: Alcide Clever M.D.   On: 07/22/2022 02:01    Procedures Procedures    Medications Ordered in ED Medications  alum & mag hydroxide-simeth (MAALOX/MYLANTA) 200-200-20 MG/5ML suspension 30 mL (30 mLs Oral Given 07/22/22 0323)    ED Course/ Medical Decision Making/ A&P                             Medical Decision Making Patient with CP and palpitations.    Amount and/or Complexity of Data Reviewed Labs: ordered.    Details: Normal white count 8, normal hemoglobin 14.8, normal platelet count 225 normal sodium 136, normal potassium 3.8, normal creatinine 1.0.  Normal troponins <2 x 2 Radiology: ordered and independent interpretation performed.    Details: Negative CXR by me   Risk OTC drugs. Prescription drug management. Risk Details: I do not believe this is a PE.  Ruledout out for MI with 2 negative troponins.  Symptoms started at rest without exertional component after eating a cheessteak sub with mayo and fries.  I believe this is likely gerd related.  Will start carafate and  gerd friendly diet as patient reports he cannot swallow pills.  Stable for discharge.  Strict return     Final Clinical Impression(s) / ED Diagnoses Final diagnoses:  Precordial pain   Return for intractable cough, coughing up blood, fevers > 100.4 unrelieved by medication, shortness of breath, intractable vomiting, chest pain, shortness of breath, weakness, numbness, changes in speech, facial asymmetry, abdominal pain, passing out, Inability to tolerate liquids or food, cough, altered mental status or any concerns. No signs of systemic illness or infection. The patient is nontoxic-appearing on exam and vital signs are within normal limits.  I have reviewed the triage vital signs and the nursing notes. Pertinent labs & imaging results that were available during my care of the patient were reviewed by me and considered in my medical decision making (see chart for details). After history, exam, and medical workup I feel the patient has been appropriately medically screened and is safe for discharge home. Pertinent diagnoses were discussed with the patient. Patient was given return precautions.   Rx / DC Orders ED Discharge Orders          Ordered    sucralfate (CARAFATE) 1 GM/10ML suspension  3 times daily with meals & bedtime        07/22/22 0437              Lucinda Spells, MD 07/22/22 4098

## 2022-07-24 ENCOUNTER — Ambulatory Visit: Payer: Commercial Managed Care - HMO | Admitting: Nurse Practitioner

## 2022-08-11 ENCOUNTER — Encounter (HOSPITAL_BASED_OUTPATIENT_CLINIC_OR_DEPARTMENT_OTHER): Payer: Commercial Managed Care - HMO

## 2022-09-24 ENCOUNTER — Ambulatory Visit (INDEPENDENT_AMBULATORY_CARE_PROVIDER_SITE_OTHER): Payer: Commercial Managed Care - HMO | Admitting: Family Medicine

## 2022-09-24 ENCOUNTER — Ambulatory Visit: Payer: Commercial Managed Care - HMO | Admitting: Physician Assistant

## 2022-09-24 ENCOUNTER — Encounter: Payer: Self-pay | Admitting: Physician Assistant

## 2022-09-24 VITALS — BP 126/70 | HR 96 | Temp 99.6°F | Ht 67.0 in | Wt 175.6 lb

## 2022-09-24 VITALS — BP 100/64 | HR 100 | Temp 99.2°F | Ht 67.0 in | Wt 175.4 lb

## 2022-09-24 DIAGNOSIS — R140 Abdominal distension (gaseous): Secondary | ICD-10-CM | POA: Diagnosis not present

## 2022-09-24 DIAGNOSIS — R52 Pain, unspecified: Secondary | ICD-10-CM | POA: Diagnosis not present

## 2022-09-24 DIAGNOSIS — K5900 Constipation, unspecified: Secondary | ICD-10-CM

## 2022-09-24 DIAGNOSIS — R143 Flatulence: Secondary | ICD-10-CM

## 2022-09-24 DIAGNOSIS — J029 Acute pharyngitis, unspecified: Secondary | ICD-10-CM

## 2022-09-24 DIAGNOSIS — U071 COVID-19: Secondary | ICD-10-CM

## 2022-09-24 LAB — POCT INFLUENZA A/B
Influenza A, POC: NEGATIVE
Influenza B, POC: NEGATIVE

## 2022-09-24 LAB — POC COVID19 BINAXNOW: SARS Coronavirus 2 Ag: POSITIVE — AB

## 2022-09-24 NOTE — Progress Notes (Signed)
Acute Office Visit   Subjective:  Patient ID: Todd [name], male    DOB: April 25, 1960, 62 y.o.   MRN: [id number]  Chief Complaint  Patient presents with   Sore Throat    Pt notes yesterday started feeling unwell, chills, unsure temperature at the time of chills, body aches and weakness     Sore Throat    Patient is a 62 year old male that presents with  +sore throat  +chills/body aches +fatigue  +non productive cough +frontal headache   -chest pain  -SHOB -ear pain or drainage   Symptoms started last night.   ROS See HPI above      Objective:   BP 126/70   Pulse 96   Temp 99.6 F (37.6 C) (Temporal)   Ht 5\' 7"  (1.702 m)   Wt 175 lb 9.6 oz (79.7 kg)   SpO2 98%   BMI 27.50 kg/m    Physical Exam Vitals reviewed.  Constitutional:      General: He is not in acute distress.    Appearance: Normal appearance. He is ill-appearing. He is not toxic-appearing or diaphoretic.  HENT:     Head: Normocephalic and atraumatic.  Eyes:     General:        Right eye: No discharge.        Left eye: No discharge.     Conjunctiva/sclera: Conjunctivae normal.  Cardiovascular:     Rate and Rhythm: Normal rate and regular rhythm.     Heart sounds: Normal heart sounds. No murmur heard.    No friction rub. No gallop.  Pulmonary:     Effort: Pulmonary effort is normal. No respiratory distress.  Musculoskeletal:        General: Normal range of motion.  Skin:    General: Skin is warm and dry.  Neurological:     General: No focal deficit present.     Mental Status: He is alert and oriented to person, place, and time. Mental status is at baseline.  Psychiatric:        Mood and Affect: Mood normal.        Behavior: Behavior normal.        Thought Content: Thought content normal.        Judgment: Judgment normal.    Results for orders placed or performed in visit on 09/24/22  POCT Influenza A/B  Result Value Ref Range   Influenza A, POC Negative Negative   Influenza B, POC  Negative Negative  POC COVID-19  Result Value Ref Range   SARS Coronavirus 2 Ag Positive (A) Negative      Assessment & Plan:  COVID  Sore throat -     POCT Influenza A/B -     POC COVID-19 BinaxNow  Body aches -     POCT Influenza A/B -     POC COVID-19 BinaxNow  -You are negative for influenza, but you are POSITIVE for COVID. -Declined Paxlovid, and decided on supportative measures.   -Alternate Tylenol 1000mg  and Ibuprofen 600-800mg  every 4 hours for pain, headache, and body aches. Recommend to eat something when taking Ibuprofen.   -Recommend the following vitamin supplements to support your immune system: Vitamin D 1,000IU daily, Vitamin C 1,000mg  daily, and Zinc 50-100mg  daily for the next 30 days.  -Rest, hydrate, and take 10 deep breaths every hour while awake -Please go to the emergency department if you develop chest pain or shortness of breath. -May take Mucinex (over the counter) for symptoms.  -  Discussed about isolation precautions.  -Follow up if not improved.   Zandra Abts, NP

## 2022-09-24 NOTE — Progress Notes (Signed)
Subjective:    Patient ID: Todd [name], male    DOB: 1960-10-06, 62 y.o.   MRN: [id number]  HPI  Todd [name] is a 62 year old male, established with Dr. Rhea Belton who had last been seen here in February 2021.  He has history of adenomatous colon polyp in 2016, internal and external hemorrhoids and in the past has had some issues with constipation. At colonoscopy in 06/2019 he did not have any recurrent polyps, was found to have grade 3 internal hemorrhoids, and also external hemorrhoids as well as a few diverticuli in the entire colon.Marland Kitchen  He comes in today with complaints of intermittent episodes of abdominal bloating and what he feels is distention.  This has been going on for many years and he says it will last for 2 to 3 weeks at a time and then gradually resolve.  During these episodes if he tries to walk fast or run he is uncomfortable and feels short of breath.  He tries to explain this as a sense of bloating.  During these episodes he tends to eat smaller meals, he never has any nausea or vomiting and does not have any change in his bowel habits necessarily with these episodes.  He does mention that he used to have a bowel movement every 4 days and now seems to have bowel movements every other day, occasionally sees a small amount of blood on the tissue which she attributes to hemorrhoids. He does feel symptoms are sometimes aggravated by ingestion of milk and/or cheese.  Did have CT of the abdomen and pelvis earlier this year in February 2024 done with contrast for complaints of abdominal pain and this was negative.  Patient has history of coronary artery disease he is status post MI and had a drug-eluting stent in 2023, also prior history of probable TIA.  Most recent echo in 2023 EF 60 to 5 to 70%.  Review of Systems Pertinent positive and negative review of systems were noted in the above HPI section.  All other review of systems was otherwise negative.   Outpatient Encounter Medications as of  09/24/2022  Medication Sig   aspirin EC 81 MG tablet Take 81 mg by mouth daily. Swallow whole.   metoprolol succinate (TOPROL XL) 25 MG 24 hr tablet Take 1 tablet (25 mg total) by mouth daily.   nitroGLYCERIN (NITROSTAT) 0.4 MG SL tablet Place 1 tablet (0.4 mg total) under the tongue every 5 (five) minutes as needed for chest pain.   rosuvastatin (CRESTOR) 20 MG tablet Take 1 tablet (20 mg total) by mouth daily.   [DISCONTINUED] benzonatate (TESSALON) 100 MG capsule Take 1 capsule (100 mg total) by mouth 3 (three) times daily as needed for cough. (Patient not taking: Reported on 09/24/2022)   [DISCONTINUED] clotrimazole-betamethasone (LOTRISONE) cream Apply 1 Application topically daily. (Patient not taking: Reported on 09/24/2022)   [DISCONTINUED] metoCLOPramide (REGLAN) 10 MG tablet Take 1 tablet (10 mg total) by mouth every 8 (eight) hours as needed for nausea.   [DISCONTINUED] sucralfate (CARAFATE) 1 GM/10ML suspension Take 10 mLs (1 g total) by mouth 4 (four) times daily -  with meals and at bedtime. (Patient not taking: Reported on 09/24/2022)   No facility-administered encounter medications on file as of 09/24/2022.   Allergies  Allergen Reactions   Wellbutrin [Bupropion Hcl] Itching and Rash   Patient Active Problem List   Diagnosis Date Noted   Hearing loss of left ear 06/02/2022   Chronic anticoagulation 08/26/2021   Internal hemorrhoid, bleeding 08/26/2021  Prolapsed internal hemorrhoids, grade 3 08/26/2021   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 coronary artery disease 08/26/2021   NSTEMI (non-ST elevated myocardial infarction) (HCC) 07/20/2021   CAD (coronary artery disease) 07/18/2021   Hyperlipidemia 07/18/2021   Unstable angina (HCC)    Non-recurrent acute serous otitis media of right ear 10/14/2018   Shortness of breath 11/12/2016   Chest pain 11/12/2016   Heart palpitations 10/20/2013   Situational anxiety 10/20/2013   Migraine with  visual aura 08/24/2013   TIA (transient ischemic attack) 08/23/2013   Blurred vision 08/23/2013   Anomia 08/23/2013   Social History   Socioeconomic History   Marital status: Divorced    Spouse name: Not on file   Number of children: Not on file   Years of education: Not on file   Highest education level: Not on file  Occupational History   Occupation: part time  Tobacco Use   Smoking status: Never   Smokeless tobacco: Never  Vaping Use   Vaping Use: Never used  Substance and Sexual Activity   Alcohol use: No   Drug use: No   Sexual activity: Not on file  Other Topics Concern   Not on file  Social History Narrative   Lives alone   Right Handed   Drinks very little caffeine   Social Determinants of Health   Financial Resource Strain: Not on file  Food Insecurity: Not on file  Transportation Needs: Not on file  Physical Activity: Not on file  Stress: Not on file  Social Connections: Not on file  Intimate Partner Violence: Not on file    Todd [name]'s family history includes Hypertension in his father and mother.      Objective:    Vitals:   09/24/22 0852  BP: 100/64  Pulse: 100  Temp: 99.2 F (37.3 C)    Physical Exam. Well-developed well-nourished older male  in no acute distress.  Height, Weight 175, BMI 27.5  HEENT; nontraumatic normocephalic, EOMI, PE R LA, sclera anicteric. Oropharynx; not done Neck; supple, no JVD Cardiovascular; regular rate and rhythm with S1-S2, no murmur rub or gallop Pulmonary; Clear bilaterally Abdomen; soft, nontender, nondistended, no palpable mass or hepatosplenomegaly, bowel sounds are active Rectal;not done Skin; benign exam, no jaundice rash or appreciable lesions Extremities; no clubbing cyanosis or edema skin warm and dry Neuro/Psych; alert and oriented x4, grossly nonfocal mood and affect appropriate        Assessment & Plan:   #30 62 year old male with many year history of intermittent episodes of abdominal  bloating and distention .  He describes episodes that may occur every several weeks and may last up to a couple of weeks at a time.  He says during these episodes when he tries to walk faster run he will get more short of breath and feels uncomfortable in his abdomen.  Appetite is also somewhat decreased during these episodes but he does not have any problems with nausea or vomiting, no changes in his bowel habits with these episodes and bowel movements have been fairly regular usually every other day.  By description he likely has some degree of lactose intolerance.  He had CT of the abdomen and pelvis with contrast February 2024 which was unremarkable,  Etiology of bloating is not entirely clear though negative CT is very reassuring.  This may be more IBS related, in addition to component of lactose intolerance.  Rule out gallbladder disease.  #2 history of adenomatous colon polyp-negative  colonoscopy 2021 #3 diverticulosis #4.  Internal and external hemorrhoids #5.  Coronary artery disease status post MI 2023 with drug-eluting stent #6.  Previous probable TIA  Plan; we discussed lactose avoidance, he was given a copy of a lactose-free diet He can try Lactaid tablets if he is going to ingest any lactose. Gas-X 1-2 with meals during these episodes. Will also add MiraLAX 17 g in 8 ounces of water daily to improve bowel habits and see if this helps with the bloating. Will be scheduled for upper abdominal ultrasound. Further recommendations pending response to above, office follow-up with Dr. Rhea Belton or myself.     Brettany Sydney S Roselene Gray PA-C 09/24/2022   Cc: Shade Flood, MD

## 2022-09-24 NOTE — Patient Instructions (Addendum)
_______________________________________________________  If your blood pressure at your visit was 140/90 or greater, please contact your primary care physician to follow up on this.  _______________________________________________________  If you are age 60 or older, your body mass index should be between 23-30. Your Body mass index is 27.47 kg/m. If this is out of the aforementioned range listed, please consider follow up with your Primary Care Provider.  If you are age 86 or younger, your body mass index should be between 19-25. Your Body mass index is 27.47 kg/m. If this is out of the aformentioned range listed, please consider follow up with your Primary Care Provider.   ________________________________________________________  The [facility] providers would like to encourage you to use [facility] to communicate with providers for non-urgent requests or questions.  Due to long hold times on the telephone, sending your provider a message by [facility] may be a faster and more efficient way to get a response.  Please allow 48 business hours for a response.  Please remember that this is for non-urgent requests.  _______________________________________________________   Bonita Quin have been scheduled for an abdominal ultrasound at [facility] (1st floor of hospital) on Wednesday July,10 2024 at 8:30 am. Please arrive 30 minutes prior to your appointment for registration. Make certain not to have anything to eat or drink 6 hours prior to your appointment. Should you need to reschedule your appointment, please contact radiology at [phone]. This test typically takes about 30 minutes to perform.   Due to recent changes in healthcare laws, you may see the results of your imaging and laboratory studies on MyChart before your provider has had a chance to review them.  We understand that in some cases there may be results that are confusing or concerning to you. Not all laboratory results come back in  the same time frame and the provider may be waiting for multiple results in order to interpret others.  Please give [place] 48 hours in order for your provider to thoroughly review all the results before contacting the office for clarification of your results.   Try lactacid tablets take two tablet with any lactose intake Try Gas X two tablets with meals  as needed. Start Miralax 17 grams in 8 oz of water daily. Avoid milk and cheese.  It was a pleasure to see you today!  Thank you for trusting me with your gastrointestinal care!

## 2022-09-24 NOTE — Patient Instructions (Signed)
-  You are negative for influenza, but you are POSITIVE for COVID. -Declined Paxlovid, and decided supportative measures.   -Alternate Tylenol 1000mg  and Ibuprofen 600-800mg  every 4 hours for pain, headache, and body aches. Recommend to eat something when taking Ibuprofen.   -Recommend the following vitamin supplements to support your immune system: Vitamin D 1,000IU daily, Vitamin C 1,000mg  daily, and Zinc 50-100mg  daily for the next 30 days.  -Rest, hydrate, and take 10 deep breaths every hour while awake -Please go to the emergency department if you develop chest pain or shortness of breath. -May take Mucinex (over the counter) for symptoms.  -Discussed about isolation precautions.  -Follow up if not improved.

## 2022-09-25 ENCOUNTER — Other Ambulatory Visit: Payer: Self-pay

## 2022-09-25 ENCOUNTER — Ambulatory Visit (HOSPITAL_COMMUNITY)
Admission: EM | Admit: 2022-09-25 | Discharge: 2022-09-25 | Disposition: A | Discharge status: Home / Self Care | Payer: Commercial Managed Care - HMO | Attending: Emergency Medicine | Admitting: Emergency Medicine

## 2022-09-25 ENCOUNTER — Encounter (HOSPITAL_COMMUNITY): Payer: Self-pay | Admitting: Emergency Medicine

## 2022-09-25 DIAGNOSIS — U071 COVID-19: Secondary | ICD-10-CM | POA: Diagnosis not present

## 2022-09-25 MED ORDER — DEXAMETHASONE 6 MG PO TABS: 6.0000 mg | ORAL_TABLET | Freq: Two times a day (BID) | ORAL | 0 refills | Status: AC

## 2022-09-25 NOTE — ED Triage Notes (Signed)
Diagnosed with covid yesterday.  Today has noticed sob with exertion. Taking ibuprofen and tylenol.  Patient has a cough as well.  Patient declined antiviral medicines

## 2022-09-25 NOTE — Discharge Instructions (Signed)
For relief of symptoms of shortness of breath with activity, please begin dexamethasone 6 mg twice daily for the next 5 days.  I recommend that you take this medication with food. Dexamethasone is safe to take with all of your other medication.    If you do not have improvement of your shortness of breath in the next 48 hours or you feel that your shortness of breath is getting worse, please go to the emergency room for further evaluation.

## 2022-09-25 NOTE — ED Provider Notes (Signed)
[facility]    CSN: [id number] Arrival date & time: 09/25/22  1830    HISTORY   Chief Complaint  Patient presents with   covid and cough   HPI Todd [name] is a pleasant, 62 y.o. male who presents to urgent care today. Patient was diagnosed with COVID-19 by his PCP yesterday.  Per PCP note, patient politely declined prescription for Paxlovid.  Patient was advised to take ibuprofen, Tylenol and Mucinex as needed.  Today, patient states he has noticed that when he is exerting himself he becomes short of breath.  Patient states he is taking ibuprofen and Tylenol as recommended, has not been taking Mucinex as he has not been coughing very much, states he is sleeping well at night.  Patient denies altered mental status, acute shortness of breath, chest pain.  Of note, patient's oxygen saturation during his PCPs visit was 98% and it is 95% today.  Patient also has elevated respirations however he appears to be in no acute distress or experiencing tachypnea at this time.  Patient is able to speak to me in complete sentences.   The history is provided by the patient.   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 coronary artery disease 08/26/2021   NSTEMI (non-ST elevated myocardial infarction) (HCC) 07/20/2021   CAD (coronary artery disease) 07/18/2021   Hyperlipidemia 07/18/2021    Unstable angina (HCC)    Non-recurrent acute serous otitis media of right ear 10/14/2018   Shortness of breath 11/12/2016   Chest pain 11/12/2016   Heart palpitations 10/20/2013   Situational anxiety 10/20/2013   Migraine with visual aura 08/24/2013   TIA (transient ischemic attack) 08/23/2013   Blurred vision 08/23/2013   Anomia 08/23/2013   Past Surgical History:  Procedure Laterality Date   APPENDECTOMY  12/01/11   lap appy   CORONARY STENT INTERVENTION N/A 07/17/2021   Procedure: CORONARY STENT INTERVENTION;  Surgeon: Kathleene Hazel, MD;  Location: [facility];  Service: Cardiovascular;  Laterality: N/A;   LAPAROSCOPIC APPENDECTOMY  12/02/2011   Procedure: APPENDECTOMY LAPAROSCOPIC;  Surgeon: Lodema Pilot, DO;  Location: [facility];  Service: General;  Laterality: N/A;   LEFT HEART CATH AND CORONARY ANGIOGRAPHY N/A 07/17/2021   Procedure: LEFT HEART CATH AND CORONARY ANGIOGRAPHY;  Surgeon: Kathleene Hazel, MD;  Location: [facility];  Service: Cardiovascular;  Laterality: N/A;    Home Medications    Prior to Admission medications   Medication Sig Start Date End Date Taking? Authorizing Provider  aspirin EC 81 MG tablet Take 81 mg by mouth daily. Swallow whole.    [provider]  metoprolol succinate (TOPROL XL) 25 MG 24 hr tablet Take 1 tablet (25 mg total) by mouth daily. 07/14/22   Hilty, Lisette Abu, MD  nitroGLYCERIN (NITROSTAT) 0.4 MG SL  tablet Place 1 tablet (0.4 mg total) under the tongue every 5 (five) minutes as needed for chest pain. 07/12/21 11/04/21  Alver Sorrow, NP  rosuvastatin (CRESTOR) 20 MG tablet Take 1 tablet (20 mg total) by mouth daily. 05/22/22   Hilty, Lisette Abu, MD    Family History Family History  Problem Relation Age of Onset   Hypertension Mother    Hypertension Father    Anemia Neg Hx    Arrhythmia Neg Hx    Asthma Neg Hx    Clotting disorder Neg Hx    Fainting Neg Hx    Heart attack Neg Hx    Heart disease  Neg Hx    Heart failure Neg Hx    Hyperlipidemia Neg Hx    Colon cancer Neg Hx    Esophageal cancer Neg Hx    Stomach cancer Neg Hx    Rectal cancer Neg Hx    Social History Social History   Tobacco Use   Smoking status: Never   Smokeless tobacco: Never  Vaping Use   Vaping Use: Never used  Substance Use Topics   Alcohol use: No   Drug use: No   Allergies   Wellbutrin [bupropion hcl]  Review of Systems Review of Systems Pertinent findings revealed after performing a 14 point review of systems has been noted in the history of present illness.  Physical Exam Vital Signs BP 126/85 (BP Location: Left Arm)   Pulse 85   Temp 98.6 F (37 C) (Oral)   Resp (!) 22   SpO2 95%   No data found.  Physical Exam Vitals and nursing note reviewed.  Constitutional:      General: He is not in acute distress.    Appearance: Normal appearance. He is not ill-appearing.  HENT:     Head: Normocephalic and atraumatic.     Salivary Glands: Right salivary gland is not diffusely enlarged or tender. Left salivary gland is not diffusely enlarged or tender.     Right Ear: Tympanic membrane, ear canal and external ear normal. No drainage. No middle ear effusion. There is no impacted cerumen. Tympanic membrane is not erythematous or bulging.     Left Ear: Tympanic membrane, ear canal and external ear normal. No drainage.  No middle ear effusion. There is no impacted cerumen. Tympanic membrane is not erythematous or bulging.     Nose: Nose normal. No nasal deformity, septal deviation, mucosal edema, congestion or rhinorrhea.     Right Turbinates: Not enlarged, swollen or pale.     Left Turbinates: Not enlarged, swollen or pale.     Right Sinus: No maxillary sinus tenderness or frontal sinus tenderness.     Left Sinus: No maxillary sinus tenderness or frontal sinus tenderness.     Mouth/Throat:     Lips: Pink. No lesions.     Mouth: Mucous membranes are moist. No oral lesions.     Pharynx:  Oropharynx is clear. Uvula midline. No posterior oropharyngeal erythema or uvula swelling.     Tonsils: No tonsillar exudate. 0 on the right. 0 on the left.  Eyes:     General: Lids are normal.        Right eye: No discharge.        Left eye: No discharge.     Extraocular Movements: Extraocular movements intact.     Conjunctiva/sclera: Conjunctivae normal.     Right eye: Right conjunctiva is not injected.     Left eye: Left conjunctiva is not injected.  Neck:     Trachea: Trachea and phonation normal.  Cardiovascular:     Rate and Rhythm: Normal rate and regular rhythm.     Pulses: Normal pulses.     Heart sounds: Normal heart sounds. No murmur heard.    No friction rub. No gallop.  Pulmonary:     Effort: Pulmonary effort is normal. No accessory muscle usage, prolonged expiration or respiratory distress.     Breath sounds: Normal breath sounds. No stridor, decreased air movement or transmitted upper airway sounds. No decreased breath sounds, wheezing, rhonchi or rales.  Chest:     Chest wall: No tenderness.  Musculoskeletal:        General: Normal range of motion.     Cervical back: Normal range of motion and neck supple. Normal range of motion.  Lymphadenopathy:     Cervical: No cervical adenopathy.  Skin:    General: Skin is warm and dry.     Findings: No erythema or rash.  Neurological:     General: No focal deficit present.     Mental Status: He is alert and oriented to person, place, and time.  Psychiatric:        Mood and Affect: Mood normal.        Behavior: Behavior normal.     Visual Acuity Right Eye Distance:   Left Eye Distance:   Bilateral Distance:    Right Eye Near:   Left Eye Near:    Bilateral Near:     UC Couse / Diagnostics / Procedures:     Radiology No results found.  Procedures Procedures (including critical care time) EKG  Pending results:  Labs Reviewed - No data to display  Medications Ordered in UC: Medications - No data to  display  UC Diagnoses / Final Clinical Impressions(s)   I have reviewed the triage vital signs and the nursing notes.  Pertinent labs & imaging results that were available during my care of the patient were reviewed by me and considered in my medical decision making (see chart for details).    Final diagnoses:  COVID-19   Patient again declined antiviral treatment.  Instead, patient was provided with a prescription for dexamethasone which he has been advised that he can take along with all of his other medications at this time.  Patient advised to go to the emergency room if shortness of breath worsens despite treatment.  Please see discharge instructions below for details of plan of care as provided to patient. ED Prescriptions     Medication Sig Dispense Auth. Provider   dexamethasone (DECADRON) 6 MG tablet Take 1 tablet (6 mg total) by mouth 2 (two) times daily with a meal for 5 days. 10 tablet Theadora Rama Scales, PA-C      PDMP not reviewed this encounter.  Pending results:  Labs Reviewed - No data to display  Discharge Instructions:   Discharge Instructions      For relief of symptoms of shortness of breath with activity, please begin dexamethasone 6 mg twice daily for the next 5 days.  I recommend that you take this medication with food. Dexamethasone is safe to take with all of your other medication.    If you do not have improvement of your shortness of breath in the next 48 hours or you feel that your shortness of breath is getting worse, please go to the emergency room for further evaluation.      Disposition Upon Discharge:  Condition: stable for discharge home  Patient presented with an acute illness with associated systemic symptoms and significant discomfort requiring urgent management. In my opinion, this is a condition that a prudent lay person (someone who possesses an average knowledge of health and medicine) may potentially expect to result in  complications if not addressed urgently such as respiratory distress, impairment of bodily function or dysfunction of bodily organs.   Routine symptom specific, illness specific and/or disease specific instructions were discussed with the patient and/or caregiver at length.   As such, the patient has been evaluated and assessed, work-up was performed and treatment was provided in alignment with urgent care protocols and evidence based medicine.  Patient/parent/caregiver has been advised that the patient may require follow up for further testing and treatment if the symptoms continue in spite of treatment, as clinically indicated and appropriate.  Patient/parent/caregiver has been advised to return to the [facility] or PCP if no better; to PCP or the Emergency Department if new signs and symptoms develop, or if the current signs or symptoms continue to change or worsen for further workup, evaluation and treatment as clinically indicated and appropriate  The patient will follow up with their current PCP if and as advised. If the patient does not currently have a PCP we will assist them in obtaining one.   The patient may need specialty follow up if the symptoms continue, in spite of conservative treatment and management, for further workup, evaluation, consultation and treatment as clinically indicated and appropriate.  Patient/parent/caregiver verbalized understanding and agreement of plan as discussed.  All questions were addressed during visit.  Please see discharge instructions below for further details of plan.  This office note has been dictated using Teaching laboratory technician.  Unfortunately, this method of dictation can sometimes lead to typographical or grammatical errors.  I apologize for your inconvenience in advance if this occurs.  Please do not hesitate to reach out to me if clarification is needed.      Theadora Rama Scales, PA-C 09/25/22 1911

## 2022-09-29 NOTE — Progress Notes (Signed)
Addendum: Reviewed and agree with assessment and management plan. Ani Deoliveira M, MD  

## 2022-10-01 ENCOUNTER — Encounter (HOSPITAL_COMMUNITY): Payer: Self-pay

## 2022-10-01 ENCOUNTER — Telehealth: Payer: Self-pay

## 2022-10-01 ENCOUNTER — Emergency Department (HOSPITAL_COMMUNITY)
Admission: RE | Admit: 2022-10-01 | Discharge: 2022-10-01 | Disposition: A | Discharge status: Home / Self Care | Payer: Commercial Managed Care - HMO | Source: Ambulatory Visit | Attending: Physician Assistant | Admitting: Physician Assistant

## 2022-10-01 ENCOUNTER — Emergency Department (HOSPITAL_COMMUNITY): Payer: Commercial Managed Care - HMO

## 2022-10-01 ENCOUNTER — Emergency Department (HOSPITAL_COMMUNITY)
Admission: EM | Admit: 2022-10-01 | Discharge: 2022-10-01 | Disposition: A | Discharge status: Home / Self Care | Payer: Commercial Managed Care - HMO | Attending: Emergency Medicine | Admitting: Emergency Medicine

## 2022-10-01 DIAGNOSIS — Z8616 Personal history of COVID-19: Secondary | ICD-10-CM | POA: Diagnosis not present

## 2022-10-01 DIAGNOSIS — R143 Flatulence: Secondary | ICD-10-CM | POA: Insufficient documentation

## 2022-10-01 DIAGNOSIS — R002 Palpitations: Secondary | ICD-10-CM | POA: Insufficient documentation

## 2022-10-01 DIAGNOSIS — I2510 Atherosclerotic heart disease of native coronary artery without angina pectoris: Secondary | ICD-10-CM | POA: Insufficient documentation

## 2022-10-01 DIAGNOSIS — K5900 Constipation, unspecified: Secondary | ICD-10-CM | POA: Insufficient documentation

## 2022-10-01 DIAGNOSIS — R140 Abdominal distension (gaseous): Secondary | ICD-10-CM

## 2022-10-01 LAB — CBC
HCT: 43.3 % (ref 39.0–52.0)
Hemoglobin: 14.7 g/dL (ref 13.0–17.0)
MCH: 28.9 pg (ref 26.0–34.0)
MCHC: 33.9 g/dL (ref 30.0–36.0)
MCV: 85.2 fL (ref 80.0–100.0)
Platelets: 259 10*3/uL (ref 150–400)
RBC: 5.08 MIL/uL (ref 4.22–5.81)
RDW: 11.8 % (ref 11.5–15.5)
WBC: 11.8 10*3/uL — ABNORMAL HIGH (ref 4.0–10.5)
nRBC: 0 % (ref 0.0–0.2)

## 2022-10-01 LAB — HEPATIC FUNCTION PANEL
ALT: 49 U/L — ABNORMAL HIGH (ref 0–44)
AST: 31 U/L (ref 15–41)
Albumin: 3.3 g/dL — ABNORMAL LOW (ref 3.5–5.0)
Alkaline Phosphatase: 45 U/L (ref 38–126)
Bilirubin, Direct: 0.1 mg/dL (ref 0.0–0.2)
Indirect Bilirubin: 0.9 mg/dL (ref 0.3–0.9)
Total Bilirubin: 1 mg/dL (ref 0.3–1.2)
Total Protein: 6.3 g/dL — ABNORMAL LOW (ref 6.5–8.1)

## 2022-10-01 LAB — BASIC METABOLIC PANEL
Anion gap: 9 (ref 5–15)
BUN: 28 mg/dL — ABNORMAL HIGH (ref 6–20)
CO2: 21 mmol/L — ABNORMAL LOW (ref 22–32)
Calcium: 8.3 mg/dL — ABNORMAL LOW (ref 8.9–10.3)
Chloride: 103 mmol/L (ref 98–111)
Creatinine, Ser: 1.01 mg/dL (ref 0.61–1.24)
GFR, Estimated: >60 mL/min (ref >60)
Glucose, Bld: 237 mg/dL — ABNORMAL HIGH (ref 70–99)
Potassium: 4.5 mmol/L (ref 3.5–5.1)
Sodium: 133 mmol/L — ABNORMAL LOW (ref 135–145)

## 2022-10-01 LAB — LIPASE, BLOOD: Lipase: 36 U/L (ref 11–51)

## 2022-10-01 MED ORDER — IOHEXOL 350 MG/ML SOLN
75.0000 mL | Freq: Once | INTRAVENOUS | Status: AC | PRN
  Administered 2022-10-01: 75 mL via INTRAVENOUS

## 2022-10-01 NOTE — ED Notes (Signed)
Pt to CT

## 2022-10-01 NOTE — Discharge Instructions (Addendum)
Your workup today was reassuring.  No concerning heart rhythms were identified.  CT scan of the abdomen did not show any concerning findings.  Follow-up with your cardiologist.  They may need to put a heart  monitor and correlate with your episodes of palpitations.  If you have any concerning symptoms return to the emergency room otherwise please follow-up with your primary care provider and cardiologist.

## 2022-10-01 NOTE — Transitions of Care (Post Inpatient/ED Visit) (Signed)
   10/01/2022  Name: Todd [name] MRN: [id number] DOB: [birth year]  Today's TOC FU Call Status: Today's TOC FU Call Status:: Successful TOC FU Call Competed TOC FU Call Complete Date: 10/01/22  Transition Care Management Follow-up Telephone Call Date of Discharge: 10/01/22 Discharge Facility: [facility]) Type of Discharge: Emergency Department Reason for ED Visit: Cardiac Conditions Cardiac Conditions Diagnosis:  (Palpitations) How have you been since you were released from the hospital?: Better Any questions or concerns?: No  Items Reviewed: Did you receive and understand the discharge instructions provided?: Yes Medications obtained,verified, and reconciled?: Yes (Medications Reviewed) Any new allergies since your discharge?: No Dietary orders reviewed?: NA Do you have support at home?: Yes People in Home: friend(s) Name of Support/Comfort Primary Source: Marwa  Medications Reviewed Today: Medications Reviewed Today     Reviewed by Lenard Simmer, CMA (Certified Medical Assistant) on 10/01/22 at 1627  Med List Status: <None>   Medication Order Taking? Sig Documenting Provider Last Dose Status Informant  aspirin EC 81 MG tablet [id number] Yes Take 81 mg by mouth daily. Swallow whole. [provider] Taking Active Self           Med Note Darryl Nestle   Sat Jul 20, 2021  4:56 PM)    metoprolol succinate (TOPROL XL) 25 MG 24 hr tablet [id number] Yes Take 1 tablet (25 mg total) by mouth daily. Chrystie Nose, MD Taking Active Self  nitroGLYCERIN (NITROSTAT) 0.4 MG SL tablet [id number]  Place 1 tablet (0.4 mg total) under the tongue every 5 (five) minutes as needed for chest pain. Alver Sorrow, NP  Expired 11/04/21 2359 Self, Pharmacy Records           Med Note Marijo File Jul 29, 2021  3:51 AM) Has on hand, hasn't needed  rosuvastatin (CRESTOR) 20 MG tablet [id number] Yes Take 1 tablet (20 mg total) by mouth daily. Chrystie Nose, MD Taking Active  Self            Home Care and Equipment/Supplies: Were Home Health Services Ordered?: No Any new equipment or medical supplies ordered?: No  Functional Questionnaire: Do you need assistance with bathing/showering or dressing?: No Do you need assistance with meal preparation?: No Do you need assistance with eating?: No Do you have difficulty maintaining continence: No Do you need assistance with getting out of bed/getting out of a chair/moving?: No Do you have difficulty managing or taking your medications?: No  Follow up appointments reviewed: PCP Follow-up appointment confirmed?: Yes Date of PCP follow-up appointment?: 10/08/22 Follow-up Provider: Dr Neva Seat [facility] Follow-up appointment confirmed?: NA Do you need transportation to your follow-up appointment?: No Do you understand care options if your condition(s) worsen?: Yes-patient verbalized understanding    SIGNATURE Lenard Simmer, CMA

## 2022-10-01 NOTE — ED Triage Notes (Signed)
Pt is coming in for palpitations that is post being Dx with covid, was sent home with dexamethasone and has been taking the medicaion as prescribed. Pt is also complaining of some abd bloating.  No chest pain is present. He is endorsing when he walks or does any activity he feels like he has an " unorganized heartbeat "

## 2022-10-01 NOTE — ED Provider Notes (Signed)
[place] EMERGENCY DEPARTMENT AT [facility] Provider Note   CSN: [id number] Arrival date & time: 10/01/22  0002     History  Chief Complaint  Patient presents with   Palpitations    Todd [name] is a 62 y.o. male.  62 year old male with past medical history significant for CAD status post PCI presents today for concern of palpitations.  He states he was diagnosed with COVID 8 days ago.  He completed a course of medication for this.  He states since then he has had improvement in symptoms.  However he states over the past few days he has developed palpitations.  He states palpitations are and they have no particular trigger.  He had history of palpitations and his cardiologist at the time told him that these were benign he never had a monitor he put on.  He states these are more frequent.  Also complains of abdominal bloating.  States he has an ultrasound scheduled for tomorrow but will like to have a workup in the ED today.  He denies any associated chest pain, shortness of breath, lightheadedness, or diaphoresis.  The history is provided by the patient. No language interpreter was used.       Home Medications Prior to Admission medications   Medication Sig Start Date End Date Taking? Authorizing Provider  aspirin EC 81 MG tablet Take 81 mg by mouth daily. Swallow whole.    [provider]  metoprolol succinate (TOPROL XL) 25 MG 24 hr tablet Take 1 tablet (25 mg total) by mouth daily. 07/14/22   Hilty, Lisette Abu, MD  nitroGLYCERIN (NITROSTAT) 0.4 MG SL tablet Place 1 tablet (0.4 mg total) under the tongue every 5 (five) minutes as needed for chest pain. 07/12/21 11/04/21  Alver Sorrow, NP  rosuvastatin (CRESTOR) 20 MG tablet Take 1 tablet (20 mg total) by mouth daily. 05/22/22   Hilty, Lisette Abu, MD      Allergies    Wellbutrin [bupropion hcl]    Review of Systems   Review of Systems  Constitutional:  Negative for chills and fever.  Respiratory:  Negative  for shortness of breath.   Cardiovascular:  Negative for chest pain.  Gastrointestinal:  Positive for abdominal pain. Negative for nausea and vomiting.  Neurological:  Negative for light-headedness.  All other systems reviewed and are negative.   Physical Exam Updated Vital Signs BP 137/81   Pulse (!) 48   Temp 97.7 F (36.5 C) (Oral)   Resp 16   SpO2 98%  Physical Exam Vitals and nursing note reviewed.  Constitutional:      General: He is not in acute distress.    Appearance: Normal appearance. He is not ill-appearing.  HENT:     Head: Normocephalic and atraumatic.     Nose: Nose normal.  Eyes:     General: No scleral icterus.    Extraocular Movements: Extraocular movements intact.     Conjunctiva/sclera: Conjunctivae normal.  Cardiovascular:     Rate and Rhythm: Normal rate and regular rhythm.     Heart sounds: Normal heart sounds.  Pulmonary:     Effort: Pulmonary effort is normal. No respiratory distress.     Breath sounds: Normal breath sounds. No wheezing or rales.  Abdominal:     General: There is no distension.     Tenderness: There is no abdominal tenderness.  Musculoskeletal:        General: Normal range of motion.     Cervical back: Normal range of  motion.     Right lower leg: No edema.     Left lower leg: No edema.  Skin:    General: Skin is warm and dry.  Neurological:     General: No focal deficit present.     Mental Status: He is alert. Mental status is at baseline.    ED Results / Procedures / Treatments   Labs (all labs ordered are listed, but only abnormal results are displayed) Labs Reviewed  BASIC METABOLIC PANEL - Abnormal; Notable for the following components:      Result Value   Sodium 133 (*)    CO2 21 (*)    Glucose, Bld 237 (*)    BUN 28 (*)    Calcium 8.3 (*)    All other components within normal limits  CBC - Abnormal; Notable for the following components:   WBC 11.8 (*)    All other components within normal limits  LIPASE,  BLOOD  HEPATIC FUNCTION PANEL    EKG None  Radiology DG Chest 1 View  Result Date: 10/01/2022 CLINICAL DATA:  Palpitations. EXAM: CHEST  1 VIEW COMPARISON:  July 22, 2022 FINDINGS: The heart size and mediastinal contours are within normal limits. Both lungs are clear. The visualized skeletal structures are unremarkable. IMPRESSION: No active disease. Electronically Signed   By: Aram Candela M.D.   On: 10/01/2022 00:36    Procedures Procedures    Medications Ordered in ED Medications  iohexol (OMNIPAQUE) 350 MG/ML injection 75 mL (75 mLs Intravenous Contrast Given 10/01/22 0335)    ED Course/ Medical Decision Making/ A&P                             Medical Decision Making Amount and/or Complexity of Data Reviewed Labs: ordered. Radiology: ordered.  Risk Prescription drug management.   62 year old male presents today for palpitations.  On telemetry he has occasional PVCs otherwise no arrhythmia.  I did ambulate patient within the room.  He had an appropriate response to exertion with heart rate increased otherwise no arrhythmia or other notable findings.  He has mild abdominal discomfort otherwise abdominal exam is benign.  Will keep patient on telemetry.  CBC shows mild leukocytosis otherwise unremarkable.  BMP shows glucose of 237, preserved renal function.  Will add on hepatic function panel, and lipase.  Will obtain CT abdomen pelvis.  Discussion had with the patient regarding waiting for his ultrasound tomorrow or obtaining a CT scan now.  He states he would like to have the CT eval done today.  Lipase within normal.  Hepatic function panel without acute findings.  CT abdomen pelvis without acute intra-abdominal findings to explain patient's abdominal discomfort.  He will follow-up outpatient for further workup.  Cardiology referral given for palpitation.  Patient is appropriate for discharge.  Telemetry without any concerning arrhythmia.  Patient is appropriate for  discharge.  Discharged in stable condition.  Sinus bradycardia for the most part.  Asymptomatic with ambulation.   Final Clinical Impression(s) / ED Diagnoses Final diagnoses:  Palpitations    Rx / DC Orders ED Discharge Orders          Ordered    Ambulatory referral to Cardiology       Comments: If you have not heard from the Cardiology office within the next 72 hours please call [phone].   10/01/22 0507              Marita Kansas, PA-C 10/01/22  1610    Tilden Fossa, MD 10/01/22 [phone]

## 2022-10-04 ENCOUNTER — Other Ambulatory Visit: Payer: Self-pay

## 2022-10-04 ENCOUNTER — Encounter (HOSPITAL_COMMUNITY): Payer: Self-pay | Admitting: Emergency Medicine

## 2022-10-04 ENCOUNTER — Ambulatory Visit (HOSPITAL_COMMUNITY)
Admission: EM | Admit: 2022-10-04 | Discharge: 2022-10-04 | Disposition: A | Discharge status: Home / Self Care | Payer: Commercial Managed Care - HMO | Attending: Physician Assistant | Admitting: Physician Assistant

## 2022-10-04 DIAGNOSIS — K0889 Other specified disorders of teeth and supporting structures: Secondary | ICD-10-CM | POA: Diagnosis not present

## 2022-10-04 DIAGNOSIS — K047 Periapical abscess without sinus: Secondary | ICD-10-CM

## 2022-10-04 MED ORDER — AMOXICILLIN-POT CLAVULANATE 875-125 MG PO TABS: 1.0000 | ORAL_TABLET | Freq: Two times a day (BID) | ORAL | 0 refills | Status: DC

## 2022-10-04 MED ORDER — IBUPROFEN 800 MG PO TABS: 800.0000 mg | ORAL_TABLET | Freq: Three times a day (TID) | ORAL | 0 refills | Status: DC

## 2022-10-04 NOTE — Discharge Instructions (Signed)
Start Augmentin twice daily for 7 days.  Use ibuprofen 800 mg up to 3 times a day for pain relief.  You should avoid NSAIDs including aspirin, ibuprofen/Advil, naproxen/Aleve with this medication as it causes stomach bleeding.  You can use Tylenol for breakthrough pain.  Gargle with warm salt water for additional symptom relief.  You should follow-up with dentist; call to schedule an appointment.  If you develop any worsening symptoms including difficulty swallowing, difficulty speaking, swelling of your throat, high fever, change in your voice you need to be seen immediately.  

## 2022-10-04 NOTE — ED Provider Notes (Signed)
[facility]    CSN: [id number] Arrival date & time: 10/04/22  1739      History   Chief Complaint Chief Complaint  Patient presents with   Dental Pain    HPI Calyn Arendall is a 62 y.o. male.   Patient presents today with a 1 to 2-day history of tooth pain.  He reports that generally he has not been feeling very well for the past several days but he attributed this to recovering from COVID as he was diagnosed with this approximately 12 days ago.  He is then developed pain and swelling in his teeth prompting reevaluation today.  Pain is rated 10 on a 0-10 pain scale, described as sharp, localized to his left upper canine/premolar, no alleviating factors identified.  He has been taking ibuprofen 6 to 800 mg with only temporary relief of symptoms.  He denies any difficulty swallowing, swelling of his throat, muffled voice, fever, nausea/vomiting.  Denies any recent dental procedure.  Denies any recent antibiotics.    Past Medical History:  Diagnosis Date   CAD (coronary artery disease)    Complicated migraine    Deafness in left ear    Diverticulosis    External hemorrhoids    Headache    Hypertension    Internal hemorrhoids    Palpitations    Stroke [facility])    TIA (transient ischemic attack)    Tubular adenoma of colon    Vision abnormalities     Patient Active Problem List   Diagnosis Date Noted   Hearing loss of left ear 06/02/2022   Chronic anticoagulation 08/26/2021   Internal hemorrhoid, bleeding 08/26/2021   Prolapsed internal hemorrhoids, grade 3 08/26/2021   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 coronary artery disease 08/26/2021   NSTEMI (non-ST elevated myocardial infarction) (HCC) 07/20/2021   CAD (coronary artery disease) 07/18/2021   Hyperlipidemia 07/18/2021   Unstable angina (HCC)    Non-recurrent acute serous otitis media of right ear 10/14/2018   Shortness of breath 11/12/2016   Chest pain 11/12/2016    Heart palpitations 10/20/2013   Situational anxiety 10/20/2013   Migraine with visual aura 08/24/2013   TIA (transient ischemic attack) 08/23/2013   Blurred vision 08/23/2013   Anomia 08/23/2013    Past Surgical History:  Procedure Laterality Date   APPENDECTOMY  12/01/11   lap appy   CORONARY STENT INTERVENTION N/A 07/17/2021   Procedure: CORONARY STENT INTERVENTION;  Surgeon: Kathleene Hazel, MD;  Location: [facility];  Service: Cardiovascular;  Laterality: N/A;   LAPAROSCOPIC APPENDECTOMY  12/02/2011   Procedure: APPENDECTOMY LAPAROSCOPIC;  Surgeon: Lodema Pilot, DO;  Location: [facility];  Service: General;  Laterality: N/A;   LEFT HEART CATH AND CORONARY ANGIOGRAPHY N/A 07/17/2021   Procedure: LEFT HEART CATH AND CORONARY ANGIOGRAPHY;  Surgeon: Kathleene Hazel, MD;  Location: [facility];  Service: Cardiovascular;  Laterality: N/A;       Home Medications    Prior to Admission medications   Medication Sig Start Date End Date Taking? Authorizing Provider  amoxicillin-clavulanate (AUGMENTIN) 875-125 MG tablet Take 1 tablet by mouth every 12 (twelve) hours. 10/04/22  Yes Mikesha Migliaccio K, PA-C  ibuprofen (ADVIL) 800 MG tablet Take 1 tablet (800 mg total) by mouth 3 (three) times daily. 10/04/22  Yes Chanita Boden, Noberto Retort, PA-C  aspirin EC 81 MG tablet Take 81 mg by mouth daily. Swallow whole.    [provider]  metoprolol succinate (TOPROL XL)  25 MG 24 hr tablet Take 1 tablet (25 mg total) by mouth daily. 07/14/22   Hilty, Lisette Abu, MD  nitroGLYCERIN (NITROSTAT) 0.4 MG SL tablet Place 1 tablet (0.4 mg total) under the tongue every 5 (five) minutes as needed for chest pain. 07/12/21 11/04/21  Alver Sorrow, NP  rosuvastatin (CRESTOR) 20 MG tablet Take 1 tablet (20 mg total) by mouth daily. 05/22/22   Hilty, Lisette Abu, MD    Family History Family History  Problem Relation Age of Onset   Hypertension Mother    Hypertension Father    Anemia Neg Hx     Arrhythmia Neg Hx    Asthma Neg Hx    Clotting disorder Neg Hx    Fainting Neg Hx    Heart attack Neg Hx    Heart disease Neg Hx    Heart failure Neg Hx    Hyperlipidemia Neg Hx    Colon cancer Neg Hx    Esophageal cancer Neg Hx    Stomach cancer Neg Hx    Rectal cancer Neg Hx     Social History Social History   Tobacco Use   Smoking status: Never   Smokeless tobacco: Never  Vaping Use   Vaping status: Never Used  Substance Use Topics   Alcohol use: No   Drug use: No     Allergies   Wellbutrin [bupropion hcl]   Review of Systems Review of Systems  Constitutional:  Positive for activity change. Negative for appetite change, fatigue and fever.  HENT:  Positive for dental problem and facial swelling. Negative for congestion, sinus pressure, sneezing and sore throat.   Respiratory:  Negative for cough and shortness of breath.   Cardiovascular:  Negative for chest pain.  Gastrointestinal:  Negative for abdominal pain, diarrhea, nausea and vomiting.     Physical Exam Triage Vital Signs ED Triage Vitals  Encounter Vitals Group     BP 10/04/22 1822 134/79     Systolic BP Percentile --      Diastolic BP Percentile --      Pulse Rate 10/04/22 1822 71     Resp 10/04/22 1822 18     Temp 10/04/22 1822 98.2 F (36.8 C)     Temp Source 10/04/22 1822 Oral     SpO2 10/04/22 1822 96 %     Weight --      Height --      Head Circumference --      Peak Flow --      Pain Score 10/04/22 1820 10     Pain Loc --      Pain Education --      Exclude from Growth Chart --    No data found.  Updated Vital Signs BP 134/79 (BP Location: Right Arm)   Pulse 71   Temp 98.2 F (36.8 C) (Oral)   Resp 18   SpO2 96%   Visual Acuity Right Eye Distance:   Left Eye Distance:   Bilateral Distance:    Right Eye Near:   Left Eye Near:    Bilateral Near:     Physical Exam Vitals reviewed.  Constitutional:      General: He is awake.     Appearance: Normal appearance. He is  well-developed. He is not ill-appearing.     Comments: Very pleasant male appears stated age in no acute distress sitting comfortably in exam room  HENT:     Head: Normocephalic and atraumatic.     Mouth/Throat:  Dentition: Abnormal dentition. Gingival swelling and dental abscesses present.     Pharynx: Uvula midline. No oropharyngeal exudate, posterior oropharyngeal erythema or uvula swelling.      Comments: Swelling and tenderness noted in gums above left upper canine and first premolar.  No evidence of Ludwig angina.  No drainable lesion noted on exam. Cardiovascular:     Rate and Rhythm: Normal rate and regular rhythm.     Heart sounds: Normal heart sounds, S1 normal and S2 normal. No murmur heard. Pulmonary:     Effort: Pulmonary effort is normal.     Breath sounds: Normal breath sounds. No stridor. No wheezing, rhonchi or rales.     Comments: Clear to auscultation bilaterally Neurological:     Mental Status: He is alert.  Psychiatric:        Behavior: Behavior is cooperative.      UC Treatments / Results  Labs (all labs ordered are listed, but only abnormal results are displayed) Labs Reviewed - No data to display  EKG   Radiology No results found.  Procedures Procedures (including critical care time)  Medications Ordered in UC Medications - No data to display  Initial Impression / Assessment and Plan / UC Course  I have reviewed the triage vital signs and the nursing notes.  Pertinent labs & imaging results that were available during my care of the patient were reviewed by me and considered in my medical decision making (see chart for details).     Patient is well-appearing, afebrile, nontoxic, nontachycardic.  No indication for emergent evaluation or imaging.  Patient was treated for dental infection with Augmentin twice daily for 7 days.  He was given ibuprofen 800 mg for pain relief with instruction to take this with food to prevent GI upset.  He is to  avoid use of additional NSAIDs.  Discussed that he should limit use of this medication given his history of cardiovascular disease, however, given this is the only thing that have provided relief of symptoms he was given a short course of it.  Can use Tylenol for breakthrough pain.  Recommend he gargle with warm salt water for additional symptom relief.  Discussed that ultimately he will need to see dentist to address underlying tooth.  He was provided low-cost dental resources in the area with after visit summary.  If he develops any worsening symptoms including fever, nausea, vomiting, swelling of his throat, muffled voice, dysphagia he needs to go to the emergency room to which he expressed understanding.    Final Clinical Impressions(s) / UC Diagnoses   Final diagnoses:  Dental infection  Pain, dental     Discharge Instructions      Start Augmentin twice daily for 7 days.  Use ibuprofen 800 mg up to 3 times a day for pain relief.  You should avoid NSAIDs including aspirin, ibuprofen/Advil, naproxen/Aleve with this medication as it causes stomach bleeding.  You can use Tylenol for breakthrough pain.  Gargle with warm salt water for additional symptom relief.  You should follow-up with dentist; call to schedule an appointment.  If you develop any worsening symptoms including difficulty swallowing, difficulty speaking, swelling of your throat, high fever, change in your voice you need to be seen immediately.      ED Prescriptions     Medication Sig Dispense Auth. Provider   amoxicillin-clavulanate (AUGMENTIN) 875-125 MG tablet Take 1 tablet by mouth every 12 (twelve) hours. 14 tablet Gabby Rackers K, PA-C   ibuprofen (ADVIL) 800 MG tablet  Take 1 tablet (800 mg total) by mouth 3 (three) times daily. 21 tablet Rithy Mandley, Noberto Retort, PA-C      PDMP not reviewed this encounter.   Jeani Hawking, PA-C 10/04/22 1842

## 2022-10-04 NOTE — ED Triage Notes (Signed)
Patient complains of dental pain.  Pain is upper, front teeth.  Onset yesterday.  Patient has taken ibuprofen with some relief.

## 2022-10-05 ENCOUNTER — Telehealth (HOSPITAL_COMMUNITY): Payer: Self-pay | Admitting: Physician Assistant

## 2022-10-05 MED ORDER — IBUPROFEN 100 MG/5ML PO SUSP: 400.0000 mg | Freq: Three times a day (TID) | ORAL | 0 refills | Status: AC | PRN

## 2022-10-05 MED ORDER — AMOXICILLIN-POT CLAVULANATE 400-57 MG/5ML PO SUSR: 875.0000 mg | Freq: Two times a day (BID) | ORAL | 0 refills | Status: AC

## 2022-10-05 NOTE — Telephone Encounter (Signed)
Patient was seen yesterday but returned today indicating that he has difficulty swallowing pills and was unable to pick up his prescriptions.  Prescriptions for Augmentin and ibuprofen liquid formulations were sent to pharmacy per his request.

## 2022-10-08 ENCOUNTER — Inpatient Hospital Stay: Payer: Commercial Managed Care - HMO | Admitting: Family Medicine

## 2022-10-22 NOTE — Progress Notes (Unsigned)
Cardiology Clinic Note   Patient Name: Todd [name] Date of Encounter: 10/23/2022  Primary Care Provider:  Shade Flood, MD Primary Cardiologist:  Chrystie Nose, MD  Patient Profile    62 year old male with history of coronary artery disease status post PCI to the mid LAD with drug-eluting stent (2.75 x 22 mm), chronic dyspnea on exertion, palpitations, migraines, dyslipidemia, and anxiety.    Last seen by Dr. Rennis Golden on 07/14/2022.  At that time he was complaining of active palpitations and he was asked to follow-up with this concerning his Apple Watch to monitor how often this was happening.  His clopidogrel was discontinued at that office visit he was to remain on low-dose aspirin 81 mg daily.  He was to follow-up in 1 year.  Past Medical History    Past Medical History:  Diagnosis Date   CAD (coronary artery disease)    Complicated migraine    Deafness in left ear    Diverticulosis    External hemorrhoids    Headache    Hypertension    Internal hemorrhoids    Palpitations    Stroke [facility])    TIA (transient ischemic attack)    Tubular adenoma of colon    Vision abnormalities    Past Surgical History:  Procedure Laterality Date   APPENDECTOMY  12/01/11   lap appy   CORONARY STENT INTERVENTION N/A 07/17/2021   Procedure: CORONARY STENT INTERVENTION;  Surgeon: Kathleene Hazel, MD;  Location: [facility];  Service: Cardiovascular;  Laterality: N/A;   LAPAROSCOPIC APPENDECTOMY  12/02/2011   Procedure: APPENDECTOMY LAPAROSCOPIC;  Surgeon: Lodema Pilot, DO;  Location: [facility];  Service: General;  Laterality: N/A;   LEFT HEART CATH AND CORONARY ANGIOGRAPHY N/A 07/17/2021   Procedure: LEFT HEART CATH AND CORONARY ANGIOGRAPHY;  Surgeon: Kathleene Hazel, MD;  Location: [facility];  Service: Cardiovascular;  Laterality: N/A;    Allergies  Allergies  Allergen Reactions   Wellbutrin [Bupropion Hcl] Itching and Rash    History of Present Illness     Todd [name] returns today with ongoing complaints of frequent palpitations, ongoing assessment and management of coronary artery disease status post PCI to the mid LAD with drug-eluting stent (2.75 x 22 mm).  He is without complaints of chest pain.  He has occasional shortness of breath when he feels the palpitations.  He says he wants to walk on a treadmill but when he is walking on the treadmill he often feels this heart skipping which is very anxiety producing for him.  He did get an Scientist, physiological and he states when he feels them he goes to check his watch and by the time he is able to pull it up the palpitations have stopped.  He is worried about the palpitations being life-threatening.  Home Medications    Current Outpatient Medications  Medication Sig Dispense Refill   aspirin EC 81 MG tablet Take 81 mg by mouth daily. Swallow whole.     metoprolol succinate (TOPROL XL) 25 MG 24 hr tablet Take 1 tablet (25 mg total) by mouth daily. 90 tablet 3   rosuvastatin (CRESTOR) 20 MG tablet Take 1 tablet (20 mg total) by mouth daily. 30 tablet 5   nitroGLYCERIN (NITROSTAT) 0.4 MG SL tablet Place 1 tablet (0.4 mg total) under the tongue every 5 (five) minutes as needed for chest pain. 25 tablet 3   No current facility-administered medications for this visit.     Family History  Family History  Problem Relation Age of Onset   Hypertension Mother    Hypertension Father    Anemia Neg Hx    Arrhythmia Neg Hx    Asthma Neg Hx    Clotting disorder Neg Hx    Fainting Neg Hx    Heart attack Neg Hx    Heart disease Neg Hx    Heart failure Neg Hx    Hyperlipidemia Neg Hx    Colon cancer Neg Hx    Esophageal cancer Neg Hx    Stomach cancer Neg Hx    Rectal cancer Neg Hx    He indicated that his mother is alive. He indicated that his father is deceased. He indicated that the status of his neg hx is unknown.  Social History    Social History   Socioeconomic History   Marital status: Divorced     Spouse name: Not on file   Number of children: Not on file   Years of education: Not on file   Highest education level: Not on file  Occupational History   Occupation: part time  Tobacco Use   Smoking status: Never   Smokeless tobacco: Never  Vaping Use   Vaping status: Never Used  Substance and Sexual Activity   Alcohol use: No   Drug use: No   Sexual activity: Not on file  Other Topics Concern   Not on file  Social History Narrative   Lives alone   Right Handed   Drinks very little caffeine   Social Determinants of Health   Financial Resource Strain: Not on file  Food Insecurity: Not on file  Transportation Needs: Not on file  Physical Activity: Not on file  Stress: Not on file  Social Connections: Not on file  Intimate Partner Violence: Not on file     Review of Systems    General:  No chills, fever, night sweats or weight changes.  Worsening anxiety over health Cardiovascular:  No chest pain, dyspnea on exertion, edema, orthopnea, increased palpitations, paroxysmal nocturnal dyspnea.  Some exercise intolerance Dermatological: No rash, lesions/masses Respiratory: No cough, dyspnea Urologic: No hematuria, dysuria Abdominal:   No nausea, vomiting, diarrhea, bright red blood per rectum, melena, or hematemesis Neurologic:  No visual changes, wkns, changes in mental status. All other systems reviewed and are otherwise negative except as noted above.  EKG Interpretation Date/Time:  Thursday October 23 2022 09:53:15 EDT Ventricular Rate:  70 PR Interval:  152 QRS Duration:  80 QT Interval:  406 QTC Calculation: 438 R Axis:   -4  Text Interpretation: Normal sinus rhythm Normal ECG When compared with ECG of 01-Oct-2022 00:12, No significant change was found Confirmed by Joni Reining [id number]) on 10/23/2022 10:18:30 AM    Physical Exam    VS:  BP 126/86   Pulse 71   Ht 5\' 7"  (1.702 m)   Wt 175 lb 3.2 oz (79.5 kg)   SpO2 94%   BMI 27.44 kg/m  , BMI Body mass  index is 27.44 kg/m.     GEN: Well nourished, well developed, in no acute distress. HEENT: normal. Neck: Supple, no JVD, carotid bruits, or masses. Cardiac: RRR, no murmurs, rubs, or gallops. No clubbing, cyanosis, edema.  Radials/DP/PT 2+ and equal bilaterally.  Respiratory:  Respirations regular and unlabored, clear to auscultation bilaterally. GI: Soft, nontender, nondistended, BS + x 4. MS: no deformity or atrophy. Skin: warm and dry, no rash. Neuro:  Strength and sensation are intact. Psych: Normal affect.  EKG  Interpretation Date/Time:  Thursday October 23 2022 09:53:15 EDT Ventricular Rate:  70 PR Interval:  152 QRS Duration:  80 QT Interval:  406 QTC Calculation: 438 R Axis:   -4  Text Interpretation: Normal sinus rhythm Normal ECG When compared with ECG of 01-Oct-2022 00:12, No significant change was found Confirmed by Joni Reining [phone]) on 10/23/2022 10:18:30 AM   Lab Results  Component Value Date   WBC 11.8 (H) 10/01/2022   HGB 14.7 10/01/2022   HCT 43.3 10/01/2022   MCV 85.2 10/01/2022   PLT 259 10/01/2022   Lab Results  Component Value Date   CREATININE 1.01 10/01/2022   BUN 28 (H) 10/01/2022   NA 133 (L) 10/01/2022   K 4.5 10/01/2022   CL 103 10/01/2022   CO2 21 (L) 10/01/2022   Lab Results  Component Value Date   ALT 49 (H) 10/01/2022   AST 31 10/01/2022   ALKPHOS 45 10/01/2022   BILITOT 1.0 10/01/2022   Lab Results  Component Value Date   CHOL 158 07/10/2022   HDL 47.30 07/10/2022   LDLCALC 90 07/10/2022   TRIG 105.0 07/10/2022   CHOLHDL 3 07/10/2022    Lab Results  Component Value Date   HGBA1C 5.8 07/10/2022     Review of Prior Studies LHC 07/17/2021 EKG Interpretation Date/Time:  Thursday October 23 2022 09:53:15 EDT Ventricular Rate:  70 PR Interval:  152 QRS Duration:  80 QT Interval:  406 QTC Calculation: 438 R Axis:   -4  Text Interpretation: Normal sinus rhythm Normal ECG When compared with ECG of 01-Oct-2022 00:12,  No significant change was found Confirmed by Joni Reining [phone]) on 10/23/2022 10:18:30 AM  Prox RCA lesion is 30% stenosed.   1st Mrg lesion is 30% stenosed.   Mid LAD lesion is 80% stenosed.   A drug-eluting stent was successfully placed using a STENT ONYX FRONTIER 2.75X22.   Post intervention, there is a 0% residual stenosis.   Severe mid LAD stenosis Mild non-obstructive disease in the mid RCA and first obtuse marginal branch Successful PTCA/DES x 1 mid LAD   Recommendations: Continue DAPT with ASA/Plavix for at least six months. Will continue beta blocker and increase Crestor to 20 mg daily. Pt will be watched overnight tonight on telemetry.     Assessment & Plan   1.  Coronary artery disease: Without complaints of chest discomfort or unstable angina.  He is having some exercise intolerance related to frequency of palpitations.  Continue secondary management with blood pressure control, statin therapy, purposeful exercise, weight loss.  He is no longer on DAPT.  Continue home on aspirin daily.  At this point he is okay to hold aspirin prior to any surgical procedures which may occur over the next year.  2.  Frequent palpitations: Ongoing issue for this gentleman.  He is stating that these are occurring more often and becoming very anxiety producing.  He wants to work out that he states when he is walking or exerting himself he notices the palpitations are worsening.  I am going to place a 2-week ZIO monitor to evaluate for frequency and morphology of his symptomatic palpitations.  He also admits to snoring with the possibility of OSA to be considered on follow-up which may also precipitate frequent PVCs and palpitations.  3.  Hypercholesterolemia:, Remains on statin therapy.  Goal of LDL less than 70.  Review of labs reveals most recent total cholesterol 158, LDL 90, HDL 47.3.  Will discuss this with him on follow-up  appointment as the labs are drawn in April 2024.  He is currently on  Crestor 20 mg daily   Will need to repeat the lipid study to evaluate his status to maintain goal LDL.         Signed, Bettey Mare. Liborio Nixon, ANP, AACC   10/23/2022 11:45 AM      Office [phone] Fax [phone]  Notice: This dictation was prepared with Dragon dictation along with smaller phrase technology. Any transcriptional errors that result from this process are unintentional and may not be corrected upon review.

## 2022-10-23 ENCOUNTER — Ambulatory Visit: Payer: Commercial Managed Care - HMO | Attending: Student | Admitting: Adult Health

## 2022-10-23 ENCOUNTER — Ambulatory Visit: Payer: Commercial Managed Care - HMO

## 2022-10-23 ENCOUNTER — Encounter: Payer: Self-pay | Admitting: Adult Health

## 2022-10-23 VITALS — BP 126/86 | HR 71 | Ht 67.0 in | Wt 175.2 lb

## 2022-10-23 DIAGNOSIS — E7800 Pure hypercholesterolemia, unspecified: Secondary | ICD-10-CM | POA: Diagnosis not present

## 2022-10-23 DIAGNOSIS — R002 Palpitations: Secondary | ICD-10-CM | POA: Diagnosis not present

## 2022-10-23 DIAGNOSIS — I2510 Atherosclerotic heart disease of native coronary artery without angina pectoris: Secondary | ICD-10-CM

## 2022-10-23 DIAGNOSIS — I214 Non-ST elevation (NSTEMI) myocardial infarction: Secondary | ICD-10-CM

## 2022-10-23 LAB — BASIC METABOLIC PANEL
BUN/Creatinine Ratio: 14 (ref 10–24)
BUN: 13 mg/dL (ref 8–27)
CO2: 25 mmol/L (ref 20–29)
Calcium: 9.6 mg/dL (ref 8.6–10.2)
Chloride: 105 mmol/L (ref 96–106)
Creatinine, Ser: 0.91 mg/dL (ref 0.76–1.27)
Glucose: 100 mg/dL — ABNORMAL HIGH (ref 70–99)
Potassium: 4.7 mmol/L (ref 3.5–5.2)
Sodium: 140 mmol/L (ref 134–144)
eGFR: 96 mL/min/{1.73_m2} (ref >59)

## 2022-10-23 LAB — MAGNESIUM: Magnesium: 2 mg/dL (ref 1.6–2.3)

## 2022-10-23 LAB — TSH: TSH: 1.76 u[IU]/mL (ref 0.450–4.500)

## 2022-10-23 NOTE — Patient Instructions (Signed)
Medication Instructions:  No Changes *If you need a refill on your cardiac medications before your next appointment, please call your pharmacy*   Lab Work: BMET, Magnesium Level,TSH Today If you have labs (blood work) drawn today and your tests are completely normal, you will receive your results only by: MyChart Message (if you have MyChart) OR A paper copy in the mail If you have any lab test that is abnormal or we need to change your treatment, we will call you to review the results.   Testing/Procedures: Christena Deem- Long Term Monitor Instructions  Your physician has requested you wear a ZIO patch monitor for 14 days.  This is a single patch monitor. [employer] supplies one patch monitor per enrollment. Additional stickers are not available. Please do not apply patch if you will be having a Nuclear Stress Test,  Echocardiogram, Cardiac CT, MRI, or Chest Xray during the period you would be wearing the  monitor. The patch cannot be worn during these tests. You cannot remove and re-apply the  ZIO XT patch monitor.  Your ZIO patch monitor will be mailed 3 day USPS to your address on file. It may take 3-5 days  to receive your monitor after you have been enrolled.  Once you have received your monitor, please review the enclosed instructions. Your monitor  has already been registered assigning a specific monitor serial # to you.  Billing and Patient Assistance Program Information  We have supplied [employer] with any of your insurance information on file for billing purposes. [employer] offers a sliding scale Patient Assistance Program for patients that do not have  insurance, or whose insurance does not completely cover the cost of the ZIO monitor.  You must apply for the Patient Assistance Program to qualify for this discounted rate.  To apply, please call [employer] at [phone], select option 4, select option 2, ask to apply for  Patient Assistance Program. Meredeth Ide will ask your household  income, and how many people  are in your household. They will quote your out-of-pocket cost based on that information.  [employer] will also be able to set up a 58-month, interest-free payment plan if needed.  Applying the monitor   Shave hair from upper left chest.  Hold abrader disc by orange tab. Rub abrader in 40 strokes over the upper left chest as  indicated in your monitor instructions.  Clean area with 4 enclosed alcohol pads. Let dry.  Apply patch as indicated in monitor instructions. Patch will be placed under collarbone on left  side of chest with arrow pointing upward.  Rub patch adhesive wings for 2 minutes. Remove white label marked "1". Remove the white  label marked "2". Rub patch adhesive wings for 2 additional minutes.  While looking in a mirror, press and release button in center of patch. A small green light will  flash 3-4 times. This will be your only indicator that the monitor has been turned on.  Do not shower for the first 24 hours. You may shower after the first 24 hours.  Press the button if you feel a symptom. You will hear a small click. Record Date, Time and  Symptom in the Patient Logbook.  When you are ready to remove the patch, follow instructions on the last 2 pages of Patient  Logbook. Stick patch monitor onto the last page of Patient Logbook.  Place Patient Logbook in the blue and white box. Use locking tab on box and tape box closed  securely. The blue and  white box has prepaid postage on it. Please place it in the mailbox as  soon as possible. Your physician should have your test results approximately 7 days after the  monitor has been mailed back to [facility].  Call [facility] Customer Care at [phone] if you have questions regarding  your ZIO XT patch monitor. Call them immediately if you see an orange light blinking on your  monitor.  If your monitor falls off in less than 4 days, contact our Monitor department at [phone].  If your  monitor becomes loose or falls off after 4 days call [employer] at [phone] for  suggestions on securing your monitor    Follow-Up: At [facility], you and your health needs are our priority.  As part of our continuing mission to provide you with exceptional heart care, we have created designated Provider Care Teams.  These Care Teams include your primary Cardiologist (physician) and Advanced Practice Providers (APPs -  Physician Assistants and Nurse Practitioners) who all work together to provide you with the care you need, when you need it.  We recommend signing up for the patient portal called "MyChart".  Sign up information is provided on this After Visit Summary.  MyChart is used to connect with patients for Virtual Visits (Telemedicine).  Patients are able to view lab/test results, encounter notes, upcoming appointments, etc.  Non-urgent messages can be sent to your provider as well.   To learn more about what you can do with MyChart, go to [email or web address].    Your next appointment:   4-5 week(s)  Provider:   Joni Reining, DNP, ANP

## 2022-10-23 NOTE — Progress Notes (Unsigned)
Enrolled patient for a 14 day Zio XT monitor to be mailed to patients home  Hilty to read 

## 2022-10-29 ENCOUNTER — Telehealth: Payer: Self-pay

## 2022-10-29 NOTE — Telephone Encounter (Addendum)
Called patient regarding results. Patient had understanding of results.----- Message from Joni Reining sent at 10/23/2022  6:25 PM EDT ----- Reviewed labs, all are within normal limits.  The patient was not fasting therefore glucose was slightly elevated.  No concerns.  No changes in medication regimen.  Will monitor.

## 2022-11-04 ENCOUNTER — Ambulatory Visit: Payer: Self-pay | Admitting: Surgery

## 2022-11-11 ENCOUNTER — Ambulatory Visit: Payer: Commercial Managed Care - HMO | Admitting: Student

## 2022-11-17 NOTE — Progress Notes (Signed)
 ERROR Patient rescheduled

## 2022-11-20 ENCOUNTER — Ambulatory Visit: Payer: Commercial Managed Care - HMO | Attending: Adult Health | Admitting: Adult Health

## 2022-11-20 ENCOUNTER — Encounter: Payer: Self-pay | Admitting: Adult Health

## 2022-11-20 NOTE — Patient Instructions (Signed)
Medication Instructions:  Your physician recommends that you continue on your current medications as directed. Please refer to the Current Medication list given to you today. *If you need a refill on your cardiac medications before your next appointment, please call your pharmacy*   Lab Work: None If you have labs (blood work) drawn today and your tests are completely normal, you will receive your results only by: MyChart Message (if you have MyChart) OR A paper copy in the mail If you have any lab test that is abnormal or we need to change your treatment, we will call you to review the results.   Testing/Procedures: None   Follow-Up: At [facility], you and your health needs are our priority.  As part of our continuing mission to provide you with exceptional heart care, we have created designated Provider Care Teams.  These Care Teams include your primary Cardiologist (physician) and Advanced Practice Providers (APPs -  Physician Assistants and Nurse Practitioners) who all work together to provide you with the care you need, when you need it.  We recommend signing up for the patient portal called "MyChart".  Sign up information is provided on this After Visit Summary.  MyChart is used to connect with patients for Virtual Visits (Telemedicine).  Patients are able to view lab/test results, encounter notes, upcoming appointments, etc.  Non-urgent messages can be sent to your provider as well.   To learn more about what you can do with MyChart, go to [email or web address].    Your next appointment:   3-4 week(s)  Provider:   Joni Reining, DNP, ANP

## 2022-12-02 ENCOUNTER — Ambulatory Visit: Payer: Commercial Managed Care - HMO | Admitting: Physician Assistant

## 2022-12-09 ENCOUNTER — Encounter (HOSPITAL_COMMUNITY): Payer: Self-pay | Admitting: *Deleted

## 2022-12-09 ENCOUNTER — Ambulatory Visit (HOSPITAL_COMMUNITY)
Admission: EM | Admit: 2022-12-09 | Discharge: 2022-12-09 | Disposition: A | Discharge status: Home / Self Care | Payer: Commercial Managed Care - HMO | Attending: Family Medicine | Admitting: Family Medicine

## 2022-12-09 DIAGNOSIS — H00015 Hordeolum externum left lower eyelid: Secondary | ICD-10-CM | POA: Diagnosis not present

## 2022-12-09 MED ORDER — TOBRAMYCIN 0.3 % OP SOLN: 1.0000 [drp] | Freq: Four times a day (QID) | OPHTHALMIC | 0 refills | Status: DC

## 2022-12-09 NOTE — ED Triage Notes (Signed)
Pt states that his left eye has been pink and swollen x 3 days.

## 2022-12-10 NOTE — ED Provider Notes (Signed)
[facility]   [id number] 12/09/22 Arrival Time: 1915  ASSESSMENT & PLAN:  1. Hordeolum externum of left lower eyelid    Begin: Meds ordered this encounter  Medications   tobramycin (TOBREX) 0.3 % ophthalmic solution    Sig: Place 1 drop into the right eye every 6 (six) hours.    Dispense:  5 mL    Refill:  0    Ophthalmic drops per orders. Warm compress to eye(s). Local eye care discussed.  Reviewed expectations re: course of current medical issues. Questions answered. Outlined signs and symptoms indicating need for more acute intervention. Patient verbalized understanding. After Visit Summary given.   SUBJECTIVE:  Todd [name] is a 62 y.o. male who presents with complaint of left lower eyelid irritation; few days; "irritated feeling". Denies eye trauma. Denies contact lens use. Reports normal vision.   OBJECTIVE:  Vitals:   12/09/22 1957  BP: 130/72  Pulse: 70  Resp: 16  Temp: 98.2 F (36.8 C)  TempSrc: Oral  SpO2: 95%    General appearance: alert; no distress HEENT: [place]; AT; PERRLA; no restriction of the extraocular movements OS: without reported pain; without conjunctival injection; with ,mild watery drainage; without gross corneal opacities; without limbal flush; with a small hordeolum on lower left eyelid Neck: supple without LAD Skin: warm and dry Psychological: alert and cooperative; normal mood and affect    Allergies  Allergen Reactions   Wellbutrin [Bupropion Hcl] Itching and Rash    Past Medical History:  Diagnosis Date   CAD (coronary artery disease)    Complicated migraine    Deafness in left ear    Diverticulosis    External hemorrhoids    Headache    Hypertension    Internal hemorrhoids    Palpitations    Stroke (HCC)    TIA (transient ischemic attack)    Tubular adenoma of colon    Vision abnormalities    Social History   Socioeconomic History   Marital status: Divorced    Spouse name: Not on file   Number of  children: Not on file   Years of education: Not on file   Highest education level: Not on file  Occupational History   Occupation: part time  Tobacco Use   Smoking status: Never   Smokeless tobacco: Never  Vaping Use   Vaping status: Never Used  Substance and Sexual Activity   Alcohol use: No   Drug use: No   Sexual activity: Not Currently  Other Topics Concern   Not on file  Social History Narrative   Lives alone   Right Handed   Drinks very little caffeine   Social Determinants of Health   Financial Resource Strain: Not on file  Food Insecurity: Not on file  Transportation Needs: Not on file  Physical Activity: Not on file  Stress: Not on file  Social Connections: Not on file  Intimate Partner Violence: Not on file   Family History  Problem Relation Age of Onset   Hypertension Mother    Hypertension Father    Anemia Neg Hx    Arrhythmia Neg Hx    Asthma Neg Hx    Clotting disorder Neg Hx    Fainting Neg Hx    Heart attack Neg Hx    Heart disease Neg Hx    Heart failure Neg Hx    Hyperlipidemia Neg Hx    Colon cancer Neg Hx    Esophageal cancer Neg Hx    Stomach cancer Neg Hx  Rectal cancer Neg Hx    Past Surgical History:  Procedure Laterality Date   APPENDECTOMY  12/01/11   lap appy   CORONARY STENT INTERVENTION N/A 07/17/2021   Procedure: CORONARY STENT INTERVENTION;  Surgeon: Kathleene Hazel, MD;  Location: [facility];  Service: Cardiovascular;  Laterality: N/A;   LAPAROSCOPIC APPENDECTOMY  12/02/2011   Procedure: APPENDECTOMY LAPAROSCOPIC;  Surgeon: Lodema Pilot, DO;  Location: [facility];  Service: General;  Laterality: N/A;   LEFT HEART CATH AND CORONARY ANGIOGRAPHY N/A 07/17/2021   Procedure: LEFT HEART CATH AND CORONARY ANGIOGRAPHY;  Surgeon: Kathleene Hazel, MD;  Location: [facility];  Service: Cardiovascular;  Laterality: N/A;      Mardella Layman, MD 12/10/22 [phone]

## 2022-12-17 NOTE — Progress Notes (Deleted)
Cardiology Clinic Note   Patient Name: Todd [name] Date of Encounter: 12/17/2022  Primary Care Provider:  Shade Flood, MD Primary Cardiologist:  Chrystie Nose, MD  Patient Profile    62 year old male with history of coronary artery disease status post PCI to the mid LAD with drug-eluting stent (2.75 x 22 mm), chronic dyspnea on exertion, palpitations, migraines, dyslipidemia, and anxiety.  When last seen patient had cardiac ZIO monitor placed to evaluate for frequent palpitations.  No medication changes were made.  Past Medical History    Past Medical History:  Diagnosis Date   CAD (coronary artery disease)    Complicated migraine    Deafness in left ear    Diverticulosis    External hemorrhoids    Headache    Hypertension    Internal hemorrhoids    Palpitations    Stroke [facility])    TIA (transient ischemic attack)    Tubular adenoma of colon    Vision abnormalities    Past Surgical History:  Procedure Laterality Date   APPENDECTOMY  12/01/11   lap appy   CORONARY STENT INTERVENTION N/A 07/17/2021   Procedure: CORONARY STENT INTERVENTION;  Surgeon: Kathleene Hazel, MD;  Location: [facility];  Service: Cardiovascular;  Laterality: N/A;   LAPAROSCOPIC APPENDECTOMY  12/02/2011   Procedure: APPENDECTOMY LAPAROSCOPIC;  Surgeon: Lodema Pilot, DO;  Location: [facility];  Service: General;  Laterality: N/A;   LEFT HEART CATH AND CORONARY ANGIOGRAPHY N/A 07/17/2021   Procedure: LEFT HEART CATH AND CORONARY ANGIOGRAPHY;  Surgeon: Kathleene Hazel, MD;  Location: [facility];  Service: Cardiovascular;  Laterality: N/A;    Allergies  Allergies  Allergen Reactions   Wellbutrin [Bupropion Hcl] Itching and Rash    History of Present Illness    ***  Home Medications    Current Outpatient Medications  Medication Sig Dispense Refill   aspirin EC 81 MG tablet Take 81 mg by mouth daily. Swallow whole.     metoprolol succinate (TOPROL XL) 25 MG 24 hr  tablet Take 1 tablet (25 mg total) by mouth daily. 90 tablet 3   nitroGLYCERIN (NITROSTAT) 0.4 MG SL tablet Place 1 tablet (0.4 mg total) under the tongue every 5 (five) minutes as needed for chest pain. 25 tablet 3   rosuvastatin (CRESTOR) 20 MG tablet Take 1 tablet (20 mg total) by mouth daily. 30 tablet 5   tobramycin (TOBREX) 0.3 % ophthalmic solution Place 1 drop into the right eye every 6 (six) hours. 5 mL 0   No current facility-administered medications for this visit.     Family History    Family History  Problem Relation Age of Onset   Hypertension Mother    Hypertension Father    Anemia Neg Hx    Arrhythmia Neg Hx    Asthma Neg Hx    Clotting disorder Neg Hx    Fainting Neg Hx    Heart attack Neg Hx    Heart disease Neg Hx    Heart failure Neg Hx    Hyperlipidemia Neg Hx    Colon cancer Neg Hx    Esophageal cancer Neg Hx    Stomach cancer Neg Hx    Rectal cancer Neg Hx    He indicated that his mother is alive. He indicated that his father is deceased. He indicated that the status of his neg hx is unknown.  Social History    Social History   Socioeconomic History   Marital status: Divorced  Spouse name: Not on file   Number of children: Not on file   Years of education: Not on file   Highest education level: Not on file  Occupational History   Occupation: part time  Tobacco Use   Smoking status: Never   Smokeless tobacco: Never  Vaping Use   Vaping status: Never Used  Substance and Sexual Activity   Alcohol use: No   Drug use: No   Sexual activity: Not Currently  Other Topics Concern   Not on file  Social History Narrative   Lives alone   Right Handed   Drinks very little caffeine   Social Determinants of Health   Financial Resource Strain: Not on file  Food Insecurity: Not on file  Transportation Needs: Not on file  Physical Activity: Not on file  Stress: Not on file  Social Connections: Not on file  Intimate Partner Violence: Not on file      Review of Systems    General:  No chills, fever, night sweats or weight changes.  Cardiovascular:  No chest pain, dyspnea on exertion, edema, orthopnea, palpitations, paroxysmal nocturnal dyspnea. Dermatological: No rash, lesions/masses Respiratory: No cough, dyspnea Urologic: No hematuria, dysuria Abdominal:   No nausea, vomiting, diarrhea, bright red blood per rectum, melena, or hematemesis Neurologic:  No visual changes, wkns, changes in mental status. All other systems reviewed and are otherwise negative except as noted above.       Physical Exam    VS:  There were no vitals taken for this visit. , BMI There is no height or weight on file to calculate BMI.     GEN: Well nourished, well developed, in no acute distress. HEENT: normal. Neck: Supple, no JVD, carotid bruits, or masses. Cardiac: RRR, no murmurs, rubs, or gallops. No clubbing, cyanosis, edema.  Radials/DP/PT 2+ and equal bilaterally.  Respiratory:  Respirations regular and unlabored, clear to auscultation bilaterally. GI: Soft, nontender, nondistended, BS + x 4. MS: no deformity or atrophy. Skin: warm and dry, no rash. Neuro:  Strength and sensation are intact. Psych: Normal affect.      Lab Results  Component Value Date   WBC 11.8 (H) 10/01/2022   HGB 14.7 10/01/2022   HCT 43.3 10/01/2022   MCV 85.2 10/01/2022   PLT 259 10/01/2022   Lab Results  Component Value Date   CREATININE 0.91 10/23/2022   BUN 13 10/23/2022   NA 140 10/23/2022   K 4.7 10/23/2022   CL 105 10/23/2022   CO2 25 10/23/2022   Lab Results  Component Value Date   ALT 49 (H) 10/01/2022   AST 31 10/01/2022   ALKPHOS 45 10/01/2022   BILITOT 1.0 10/01/2022   Lab Results  Component Value Date   CHOL 158 07/10/2022   HDL 47.30 07/10/2022   LDLCALC 90 07/10/2022   TRIG 105.0 07/10/2022   CHOLHDL 3 07/10/2022    Lab Results  Component Value Date   HGBA1C 5.8 07/10/2022     Review of Prior Studies      Assessment &  Plan   1.  ***     {Are you ordering a CV Procedure (e.g. stress test, cath, DCCV, TEE, etc)?   Press F2        :[id number]}   Signed, Bettey Mare. Liborio Nixon, ANP, AACC   12/17/2022 5:26 PM      Office [phone] Fax [phone]  Notice: This dictation was prepared with Dragon dictation along with smaller phrase technology. Any transcriptional errors that  result from this process are unintentional and may not be corrected upon review.

## 2022-12-18 ENCOUNTER — Telehealth: Payer: Self-pay

## 2022-12-18 NOTE — Telephone Encounter (Signed)
Called patient regarding monitor . Left message for patient to call office

## 2022-12-19 ENCOUNTER — Telehealth: Payer: Self-pay

## 2022-12-19 ENCOUNTER — Ambulatory Visit: Payer: Commercial Managed Care - HMO | Attending: Adult Health | Admitting: Adult Health

## 2022-12-19 NOTE — Telephone Encounter (Signed)
Called patient in regards to Monitor . Left message for patient to call office.

## 2023-01-03 ENCOUNTER — Other Ambulatory Visit: Payer: Self-pay | Admitting: Internal Medicine

## 2023-01-14 ENCOUNTER — Ambulatory Visit: Payer: Commercial Managed Care - HMO | Admitting: Family Medicine

## 2023-01-14 ENCOUNTER — Encounter: Payer: Self-pay | Admitting: Family Medicine

## 2023-01-27 NOTE — Progress Notes (Deleted)
  Cardiology Office Note:  .   Date:  01/27/2023  ID:  Todd [name], DOB 11-May-1960, MRN [id number] PCP: Shade Flood, MD  [facility] Providers Cardiologist: Chrystie Nose, MD  }   History of Present Illness: .   Todd [name] is a 62 y.o. male  with history of coronary artery disease status post PCI to the mid LAD with drug-eluting stent (2.75 x 22 mm), chronic dyspnea on exertion, palpitations, migraines, dyslipidemia, and anxiety.  He has complaints of chronic palpitations and racing heart rate.Marland Kitchen  ZIO monitor was ordered, but patient did not send it back.  We do not have any clinical data concerning his recurrence of palpitations or frequency.  ROS: ***  Studies Reviewed: .        *** EKG Interpretation Date/Time:    Ventricular Rate:    PR Interval:    QRS Duration:    QT Interval:    QTC Calculation:   R Axis:      Text Interpretation:      Physical Exam:   VS:  There were no vitals taken for this visit.   Wt Readings from Last 3 Encounters:  11/20/22 177 lb 12.8 oz (80.6 kg)  10/23/22 175 lb 3.2 oz (79.5 kg)  09/24/22 175 lb 9.6 oz (79.7 kg)    GEN: Well nourished, well developed in no acute distress NECK: No JVD; No carotid bruits CARDIAC: ***RRR, no murmurs, rubs, gallops RESPIRATORY:  Clear to auscultation without rales, wheezing or rhonchi  ABDOMEN: Soft, non-tender, non-distended EXTREMITIES:  No edema; No deformity   ASSESSMENT AND PLAN: .   ***    {Are you ordering a CV Procedure (e.g. stress test, cath, DCCV, TEE, etc)?   Press F2        :[id number]}    Signed, Bettey Mare. Liborio Nixon, ANP, AACC

## 2023-01-30 ENCOUNTER — Ambulatory Visit: Payer: Commercial Managed Care - HMO | Admitting: Adult Health

## 2023-01-30 ENCOUNTER — Telehealth: Payer: Self-pay

## 2023-01-30 ENCOUNTER — Telehealth: Payer: Self-pay | Admitting: Adult Health

## 2023-01-30 ENCOUNTER — Ambulatory Visit: Payer: Managed Care, Other (non HMO) | Attending: Adult Health

## 2023-01-30 DIAGNOSIS — R002 Palpitations: Secondary | ICD-10-CM

## 2023-01-30 NOTE — Telephone Encounter (Signed)
Called patient to advise a new Zio Monitor ordered and will be mail. Rescheduled patient for follow up appointment for December 23 rd at 8 AM with Joni Reining.

## 2023-01-30 NOTE — Telephone Encounter (Signed)
Spoke with patient. Informed him monitor results have not been received. Patient states he dropped his heart monitor off in-person on [employer] at the post office about 2 months ago.  Patient did not want to keep his appt scheduled for today if no monitor results were available.  Sent message to Joni Reining, NP, Myna Hidalgo, CMA and Alphonzo Severance regarding this matter. Unable to track monitor, will order a new Zio heart monitor and repeat study and reschedule appt with patient for F/U.

## 2023-01-30 NOTE — Telephone Encounter (Signed)
Pt has an appt today 11/0/24 at 10:30 but wants to know if you have the monitor results are back yet, if not he said he does not need to come. Pt would like a call back.

## 2023-01-30 NOTE — Progress Notes (Unsigned)
Enrolled patient for a 14 day Zio XT  monitor to be mailed to patients home  °

## 2023-02-07 ENCOUNTER — Ambulatory Visit (HOSPITAL_COMMUNITY)
Admission: EM | Admit: 2023-02-07 | Discharge: 2023-02-07 | Disposition: A | Discharge status: Home / Self Care | Payer: Managed Care, Other (non HMO)

## 2023-02-07 ENCOUNTER — Telehealth (HOSPITAL_COMMUNITY): Payer: Self-pay | Admitting: *Deleted

## 2023-02-07 ENCOUNTER — Encounter (HOSPITAL_COMMUNITY): Payer: Self-pay

## 2023-02-07 DIAGNOSIS — H9201 Otalgia, right ear: Secondary | ICD-10-CM | POA: Diagnosis not present

## 2023-02-07 DIAGNOSIS — L089 Local infection of the skin and subcutaneous tissue, unspecified: Secondary | ICD-10-CM

## 2023-02-07 MED ORDER — DOXYCYCLINE MONOHYDRATE 25 MG/5ML PO SUSR: 100.0000 mg | Freq: Two times a day (BID) | ORAL | 0 refills | Status: DC

## 2023-02-07 NOTE — ED Provider Notes (Signed)
[facility]    CSN: [id number] Arrival date & time: 02/07/23  1008      History   Chief Complaint No chief complaint on file.   HPI Eustace Romanoski is a 62 y.o. male.   62 year old male who presents to urgent care with complaints of pain behind the right ear.  This started about 2 days ago and has gotten somewhat worse.  There is redness to the area as well.  He does have some congestion.  He denies any ear pain, sore throat, sinus symptoms.  He does have fevers and chills.  He has a prescription to start doxycycline for an area on his eye on the left but has not started this yet.  He requests liquid medication if possible.  He does have hearing loss in the left ear but no issues hearing on the right.    Past Medical History:  Diagnosis Date  . CAD (coronary artery disease)   . Complicated migraine   . Deafness in left ear   . Diverticulosis   . External hemorrhoids   . Headache   . Hypertension   . Internal hemorrhoids   . Palpitations   . Stroke (HCC)   . TIA (transient ischemic attack)   . Tubular adenoma of colon   . Vision abnormalities     Patient Active Problem List   Diagnosis Date Noted  . Hearing loss of left ear 06/02/2022  . Chronic anticoagulation 08/26/2021  . Internal hemorrhoid, bleeding 08/26/2021  . Prolapsed internal hemorrhoids, grade 3 08/26/2021  . 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 coronary artery disease 08/26/2021  . NSTEMI (non-ST elevated myocardial infarction) (HCC) 07/20/2021  . CAD (coronary artery disease) 07/18/2021  . Hyperlipidemia 07/18/2021  . Unstable angina (HCC)   . Non-recurrent acute serous otitis media of right ear 10/14/2018  . Shortness of breath 11/12/2016  . Chest pain 11/12/2016  . Heart palpitations 10/20/2013  . Situational anxiety 10/20/2013  . Migraine with visual aura 08/24/2013  . TIA (transient ischemic attack) 08/23/2013  . Blurred vision 08/23/2013  . Anomia  08/23/2013    Past Surgical History:  Procedure Laterality Date  . APPENDECTOMY  12/01/11   lap appy  . CORONARY STENT INTERVENTION N/A 07/17/2021   Procedure: CORONARY STENT INTERVENTION;  Surgeon: Kathleene Hazel, MD;  Location: [facility];  Service: Cardiovascular;  Laterality: N/A;  . LAPAROSCOPIC APPENDECTOMY  12/02/2011   Procedure: APPENDECTOMY LAPAROSCOPIC;  Surgeon: Lodema Pilot, DO;  Location: [facility];  Service: General;  Laterality: N/A;  . LEFT HEART CATH AND CORONARY ANGIOGRAPHY N/A 07/17/2021   Procedure: LEFT HEART CATH AND CORONARY ANGIOGRAPHY;  Surgeon: Kathleene Hazel, MD;  Location: [facility];  Service: Cardiovascular;  Laterality: N/A;       Home Medications    Prior to Admission medications   Medication Sig Start Date End Date Taking? Authorizing Provider  doxycycline (DORYX) 100 MG EC tablet Take 100 mg by mouth 2 (two) times daily.   Yes [provider]  aspirin EC 81 MG tablet Take 81 mg by mouth daily. Swallow whole.    [provider]  metoprolol succinate (TOPROL XL) 25 MG 24 hr tablet Take 1 tablet (25 mg total) by mouth daily. 07/14/22   Hilty, Lisette Abu, MD  nitroGLYCERIN (NITROSTAT) 0.4 MG SL tablet Place 1 tablet (0.4 mg total) under the tongue every 5 (five) minutes as needed for chest pain. 07/12/21 11/04/21  Dan Humphreys,  Storm Frisk, NP  rosuvastatin (CRESTOR) 20 MG tablet TAKE 1 TABLET BY MOUTH EVERY DAY 01/05/23   Hilty, Lisette Abu, MD  tobramycin (TOBREX) 0.3 % ophthalmic solution Place 1 drop into the right eye every 6 (six) hours. 12/09/22   Mardella Layman, MD    Family History Family History  Problem Relation Age of Onset  . Hypertension Mother   . Hypertension Father   . Anemia Neg Hx   . Arrhythmia Neg Hx   . Asthma Neg Hx   . Clotting disorder Neg Hx   . Fainting Neg Hx   . Heart attack Neg Hx   . Heart disease Neg Hx   . Heart failure Neg Hx   . Hyperlipidemia Neg Hx   . Colon cancer Neg Hx   .  Esophageal cancer Neg Hx   . Stomach cancer Neg Hx   . Rectal cancer Neg Hx     Social History Social History   Tobacco Use  . Smoking status: Never  . Smokeless tobacco: Never  Vaping Use  . Vaping status: Never Used  Substance Use Topics  . Alcohol use: No  . Drug use: No     Allergies   Wellbutrin [bupropion hcl]   Review of Systems Review of Systems  Constitutional:  Positive for chills and fever.  HENT:  Positive for congestion and ear pain. Negative for dental problem, facial swelling, hearing loss, sinus pressure, sinus pain and sore throat.   Eyes:  Negative for pain and visual disturbance.  Respiratory:  Negative for cough and shortness of breath.   Cardiovascular:  Negative for chest pain and palpitations.  Gastrointestinal:  Negative for abdominal pain and vomiting.  Genitourinary:  Negative for dysuria and hematuria.  Musculoskeletal:  Negative for arthralgias and back pain.  Skin:  Negative for color change and rash.  Neurological:  Negative for seizures and syncope.  All other systems reviewed and are negative.    Physical Exam Triage Vital Signs ED Triage Vitals  Encounter Vitals Group     BP 02/07/23 1023 134/77     Systolic BP Percentile --      Diastolic BP Percentile --      Pulse Rate 02/07/23 1023 91     Resp 02/07/23 1023 16     Temp 02/07/23 1023 98.7 F (37.1 C)     Temp Source 02/07/23 1023 Oral     SpO2 02/07/23 1023 94 %     Weight --      Height --      Head Circumference --      Peak Flow --      Pain Score 02/07/23 1024 7     Pain Loc --      Pain Education --      Exclude from Growth Chart --    No data found.  Updated Vital Signs BP 134/77 (BP Location: Right Arm)   Pulse 91   Temp 98.7 F (37.1 C) (Oral)   Resp 16   SpO2 94%   Visual Acuity Right Eye Distance:   Left Eye Distance:   Bilateral Distance:    Right Eye Near:   Left Eye Near:    Bilateral Near:     Physical Exam Vitals and nursing note  reviewed.  Constitutional:      General: He is not in acute distress.    Appearance: He is well-developed.  HENT:     Head: Normocephalic and atraumatic.   Eyes:  Conjunctiva/sclera: Conjunctivae normal.  Cardiovascular:     Rate and Rhythm: Normal rate and regular rhythm.     Heart sounds: No murmur heard. Pulmonary:     Effort: Pulmonary effort is normal. No respiratory distress.     Breath sounds: Normal breath sounds.  Abdominal:     Palpations: Abdomen is soft.     Tenderness: There is no abdominal tenderness.  Musculoskeletal:        General: No swelling.     Cervical back: Neck supple.  Skin:    General: Skin is warm and dry.     Capillary Refill: Capillary refill takes less than 2 seconds.  Neurological:     Mental Status: He is alert.  Psychiatric:        Mood and Affect: Mood normal.     UC Treatments / Results  Labs (all labs ordered are listed, but only abnormal results are displayed) Labs Reviewed - No data to display  EKG   Radiology No results found.  Procedures Procedures (including critical care time)  Medications Ordered in UC Medications - No data to display  Initial Impression / Assessment and Plan / UC Course  I have reviewed the triage vital signs and the nursing notes.  Pertinent labs & imaging results that were available during my care of the patient were reviewed by me and considered in my medical decision making (see chart for details).     Posterior auricular pain of right ear  Skin infection   This appears to be an infection of the posterior auricular area of the right ear. No signs of ear infection or throat infection. We can treat this with doxycycline. Doxycycline 20 mLs twice daily for 10 days (liquid per patient request) May use ibuprofen for pain alternating with tylenol. Return to urgent care or PCP if symptoms worsen or fail to resolve.  Final Clinical Impressions(s) / UC Diagnoses   Final diagnoses:  None    Discharge Instructions   None    ED Prescriptions   None    PDMP not reviewed this encounter.   Landis Martins, PA-C 02/07/23 1057

## 2023-02-07 NOTE — ED Triage Notes (Signed)
Patient c/o swelling and pain behind his right ear x 2 days.

## 2023-02-07 NOTE — Telephone Encounter (Signed)
Pt states pharmacy doesn't have liquid at the pharm we sent it to but [facility] [address] has it they can fill. Advised I will send rx to that pharm pt verbalized understanding.

## 2023-02-07 NOTE — Discharge Instructions (Addendum)
This appears to be an infection in the area behind the ear. We can treat this with doxycycline. Doxycycline 20 mLs twice daily for 10 days, take with food  May use ibuprofen for pain alternating with tylenol. Return to urgent care or PCP if symptoms worsen or fail to resolve.

## 2023-02-09 ENCOUNTER — Ambulatory Visit: Payer: Managed Care, Other (non HMO) | Admitting: Family Medicine

## 2023-02-09 ENCOUNTER — Emergency Department (HOSPITAL_COMMUNITY)
Admission: EM | Admit: 2023-02-09 | Discharge: 2023-02-09 | Disposition: A | Discharge status: Home / Self Care | Payer: Commercial Managed Care - HMO | Attending: Emergency Medicine | Admitting: Emergency Medicine

## 2023-02-09 ENCOUNTER — Other Ambulatory Visit: Payer: Self-pay

## 2023-02-09 ENCOUNTER — Encounter (HOSPITAL_COMMUNITY): Payer: Self-pay | Admitting: Emergency Medicine

## 2023-02-09 DIAGNOSIS — L245 Irritant contact dermatitis due to other chemical products: Secondary | ICD-10-CM | POA: Diagnosis not present

## 2023-02-09 DIAGNOSIS — R21 Rash and other nonspecific skin eruption: Secondary | ICD-10-CM | POA: Diagnosis present

## 2023-02-09 DIAGNOSIS — Z7982 Long term (current) use of aspirin: Secondary | ICD-10-CM | POA: Diagnosis not present

## 2023-02-09 MED ORDER — FLUOCINOLONE ACETONIDE 0.01 % EX SOLN: Freq: Two times a day (BID) | CUTANEOUS | 0 refills | Status: DC

## 2023-02-09 NOTE — ED Provider Notes (Signed)
[place] [facility] AT [facility] Provider Note   CSN: [id number] Arrival date & time: 02/09/23  1050     History  Chief Complaint  Patient presents with   Rash    Todd [name] is a 62 y.o. male Patient presents to the ED with chief complaint of a rash spreading from his posterior R ear to now his upper forehead and posterior L neck. The rash initially started on Thursday and was localized to the R posterior ear. On Saturday, he went to Urgent Care for this concern and was given liquid Doxycyline and was told if he didn't see improvement in 2 days, to follow up with the ED and that is why he presents here today. He describes the feeling of the rash as "my skin feels like its being stretched." Denies any pruritus, burning, ear drainage. He has tried Ibuprofen at home when he has a fever and is taking the Doxycyline from urgent care as prescribed. He reports starting a new shampoo 2 weeks ago, patient unsure of the name and if it is a scented shampoo. Positive for fever and chills. No recent illness/injury, no new hearing loss, no vision changes.    Rash      Home Medications Prior to Admission medications   Medication Sig Start Date End Date Taking? Authorizing Provider  fluocinolone (SYNALAR) 0.01 % external solution Apply topically 2 (two) times daily. For 2 weeks max 02/09/23  Yes Dafne Nield H, PA-C  aspirin EC 81 MG tablet Take 81 mg by mouth daily. Swallow whole.    [provider]  doxycycline (VIBRAMYCIN) 25 MG/5ML SUSR Take 20 mLs (100 mg total) by mouth 2 (two) times daily for 10 days. 02/07/23 02/17/23  Doristine Mango A, PA-C  metoprolol succinate (TOPROL XL) 25 MG 24 hr tablet Take 1 tablet (25 mg total) by mouth daily. 07/14/22   Hilty, Lisette Abu, MD  nitroGLYCERIN (NITROSTAT) 0.4 MG SL tablet Place 1 tablet (0.4 mg total) under the tongue every 5 (five) minutes as needed for chest pain. 07/12/21 11/04/21  Alver Sorrow, NP   rosuvastatin (CRESTOR) 20 MG tablet TAKE 1 TABLET BY MOUTH EVERY DAY 01/05/23   Hilty, Lisette Abu, MD  tobramycin (TOBREX) 0.3 % ophthalmic solution Place 1 drop into the right eye every 6 (six) hours. 12/09/22   Mardella Layman, MD      Allergies    Wellbutrin [bupropion hcl]    Review of Systems   Review of Systems  Skin:  Positive for rash.  All other systems reviewed and are negative.   Physical Exam Updated Vital Signs BP 128/82 (BP Location: Left Arm)   Pulse 74   Temp 98 F (36.7 C) (Oral)   Resp 18   Ht 5\' 7"  (1.702 m)   Wt 80.3 kg   SpO2 100%   BMI 27.72 kg/m  Physical Exam Vitals and nursing note reviewed.  Constitutional:      General: He is not in acute distress.    Appearance: Normal appearance.  HENT:     Head: Normocephalic and atraumatic.  Eyes:     General:        Right eye: No discharge.        Left eye: No discharge.  Cardiovascular:     Rate and Rhythm: Normal rate and regular rhythm.  Pulmonary:     Effort: Pulmonary effort is normal. No respiratory distress.  Musculoskeletal:        General: No deformity.  Skin:    General: Skin is warm and dry.     Comments: Patient with some possible mild skin redness, irritation of the scalp, no macules, papules, no fluctuance or drainage  Neurological:     Mental Status: He is alert and oriented to person, place, and time.  Psychiatric:        Mood and Affect: Mood normal.        Behavior: Behavior normal.     ED Results / Procedures / Treatments   Labs (all labs ordered are listed, but only abnormal results are displayed) Labs Reviewed - No data to display  EKG None  Radiology No results found.  Procedures Procedures    Medications Ordered in ED Medications - No data to display  ED Course/ Medical Decision Making/ A&P                                 Medical Decision Making  This patient is a 62 y.o. male who presents to the ED for concern of rash of scalp.   Differential diagnoses  prior to evaluation: Dermatitis, seborrheic dermatitis, cellulitis, vs other  Past Medical History / Social History / Additional history: Chart reviewed. Pertinent results include: overall noncontributory  Physical Exam: Physical exam performed. The pertinent findings include:  Patient with some possible mild skin redness, irritation of the scalp, no macules, papules, no fluctuance or drainage  Medications / Treatment: I overall do not appreciate a significant rash on this patient's scalp, possible that he could have some mild contact or seborrheic dermatitis, he reports using a new shampoo recently, and so encouraged him to discontinue, will treat with fluocinolone, encourage dermatology follow-up if no improvement, discussed the patient could likely discontinue his doxycycline based on my evaluation   Disposition: After consideration of the diagnostic results and the patients response to treatment, I feel that patient stable for discharge with plan as above.   emergency department workup does not suggest an emergent condition requiring admission or immediate intervention beyond what has been performed at this time. The plan is: as above. The patient is safe for discharge and has been instructed to return immediately for worsening symptoms, change in symptoms or any other concerns.  Final Clinical Impression(s) / ED Diagnoses Final diagnoses:  Irritant contact dermatitis due to other chemical products    Rx / DC Orders ED Discharge Orders          Ordered    fluocinolone (SYNALAR) 0.01 % external solution  2 times daily        02/09/23 1311              Lukasz Rogus, Cromwell H, PA-C 02/09/23 1405    Alvira Monday, MD 02/10/23 2311

## 2023-02-09 NOTE — Discharge Instructions (Addendum)
You can apply the steroid solution to all the areas where you are having some raised rash twice daily for 1 to 2 weeks, if you are not having significant relief at that time please follow-up with the dermatologist, you may want to go ahead and call and schedule an appointment since it can sometimes take a long time to get into see the dermatologist.  If you have significant worsening of symptoms including persistent fever, chills, nausea, vomiting you may want to return to the emergency department.  Stop using the new shampoo that you are using and return to what ever you are using previously.

## 2023-02-09 NOTE — ED Triage Notes (Signed)
Patient arrives ambulatory by POV c/o infection to his skin behind his ear since Thursday. Patient states he went to UC and started on antibiotics however feels like it is not improving.

## 2023-02-10 ENCOUNTER — Telehealth: Payer: Self-pay

## 2023-02-10 DIAGNOSIS — R002 Palpitations: Secondary | ICD-10-CM

## 2023-02-10 NOTE — Transitions of Care (Post Inpatient/ED Visit) (Signed)
   02/10/2023  Name: Todd [name] MRN: [id number] DOB: 1961/02/15  Today's TOC FU Call Status: Today's TOC FU Call Status:: Successful TOC FU Call Completed TOC FU Call Complete Date: 02/10/23 Patient's Name and Date of Birth confirmed.  Transition Care Management Follow-up Telephone Call Date of Discharge: 02/10/23 Discharge Facility: [facility]) Type of Discharge: Emergency Department Reason for ED Visit: Other: How have you been since you were released from the hospital?: Same Any questions or concerns?: No  Items Reviewed: Did you receive and understand the discharge instructions provided?: Yes Medications obtained,verified, and reconciled?: Yes (Medications Reviewed) Any new allergies since your discharge?: No Dietary orders reviewed?: NA Do you have support at home?: Yes People in Home: friend(s) Name of Support/Comfort Primary Source: Si Gaul  Medications Reviewed Today: Medications Reviewed Today   Medications were not reviewed in this encounter     Home Care and Equipment/Supplies: Were Home Health Services Ordered?: NA Any new equipment or medical supplies ordered?: NA  Functional Questionnaire: Do you need assistance with bathing/showering or dressing?: No Do you need assistance with meal preparation?: No Do you need assistance with eating?: No Do you have difficulty maintaining continence: No Do you need assistance with getting out of bed/getting out of a chair/moving?: No Do you have difficulty managing or taking your medications?: No  Follow up appointments reviewed: PCP Follow-up appointment confirmed?: Yes Date of PCP follow-up appointment?: 02/12/23 Follow-up Provider: Meredith Staggers Specialist [facility] Follow-up appointment confirmed?: NA Do you need transportation to your follow-up appointment?: No Do you understand care options if your condition(s) worsen?: Yes-patient verbalized understanding    SIGNATURE [facility], RMA

## 2023-02-12 ENCOUNTER — Encounter: Payer: Self-pay | Admitting: Family Medicine

## 2023-02-12 ENCOUNTER — Ambulatory Visit: Payer: Managed Care, Other (non HMO) | Admitting: Family Medicine

## 2023-02-12 VITALS — BP 116/70 | HR 60 | Temp 99.0°F | Ht 67.0 in | Wt 181.0 lb

## 2023-02-12 DIAGNOSIS — R21 Rash and other nonspecific skin eruption: Secondary | ICD-10-CM

## 2023-02-12 DIAGNOSIS — H00015 Hordeolum externum left lower eyelid: Secondary | ICD-10-CM | POA: Diagnosis not present

## 2023-02-12 MED ORDER — DOXYCYCLINE MONOHYDRATE 25 MG/5ML PO SUSR: ORAL | 0 refills | Status: DC

## 2023-02-12 NOTE — Telephone Encounter (Signed)
This is not urgent.  We can see about getting him in within the next few weeks but he doesn't need to be squeezed in immediately.  Thanks!

## 2023-02-12 NOTE — Patient Instructions (Addendum)
Glad to hear the rash is improved.  It was likely due to contact dermatitis or possibly from previous shampoo, especially with the improvement using the steroid solution.  Okay to use that intermittently if needed until rash resolves.  If any worsening symptoms be seen.  Area on your left lower eyelid appears to be a stye, see information below.  I will refill the doxycycline as that was recommended by your eye specialist, but follow-up with them as planned.  Return to the clinic or go to the nearest emergency room if any of your symptoms worsen or new symptoms occur.   Stye A stye, also known as a hordeolum, is a bump that forms on an eyelid. It may look like a pimple next to the eyelash. A stye can form inside the eyelid (internal stye) or outside the eyelid (external stye). A stye can cause redness, swelling, and pain on the eyelid. Styes are very common. Anyone can get them at any age. They usually occur in just one eye at a time, but you may have more than one in either eye. What are the causes? A stye is caused by an infection. The infection is almost always caused by bacteria called Staphylococcus aureus. This is a common type of bacteria that lives on the skin. An internal stye may result from an infected oil-producing gland inside the eyelid. An external stye may be caused by an infection at the base of the eyelash (hair follicle). What increases the risk? You are more likely to develop a stye if: You have had a stye before. You have any of these conditions: Red, itchy, inflamed eyelids (blepharitis). A skin condition such as seborrheic dermatitis or rosacea. High fat levels in your blood (lipids). Dry eyes. What are the signs or symptoms? The most common symptom of a stye is eyelid pain. Internal styes are more painful than external styes. Other symptoms may include: Painful swelling of your eyelid. A scratchy feeling in your eye. Tearing and redness of your eye. A pimple-like  bump on the edge of the eyelid. Pus draining from the stye. How is this diagnosed? Your health care provider may be able to diagnose a stye just by examining your eye. The health care provider may also check to make sure: You do not have a fever or other signs of a more serious infection. The infection has not spread to other parts of your eye or areas around your eye. How is this treated? Most styes will clear up in a few days without treatment or with warm compresses applied to the area. You may need to use antibiotic drops or ointment to treat an infection. Sometimes, steroid drops or ointment are used in addition to antibiotics. In some cases, your health care provider may give you a small steroid injection in the eyelid. If your stye does not heal with routine treatment, your health care provider may drain pus from the stye using a thin blade or needle. This may be done if the stye is large, causing a lot of pain, or affecting your vision. Follow these instructions at home: Take over-the-counter and prescription medicines only as told by your health care provider. This includes eye drops or ointments. If you were prescribed an antibiotic medicine, steroid medicine, or both, apply or use them as told by your health care provider. Do not stop using the medicine even if your condition improves. Apply a warm, wet cloth (warm compress) to your eye for 5-10 minutes, 4 to 6  times a day. Clean the affected eyelid as directed by your health care provider. Do not wear contact lenses or eye makeup until your stye has healed and your health care provider says that it is safe. Do not try to pop or drain the stye. Do not rub your eye. Contact a health care provider if: You have chills or a fever. Your stye does not go away after several days. Your stye affects your vision. Your eyeball becomes swollen, red, or painful. Get help right away if: You have pain when moving your eye around. Summary A stye  is a bump that forms on an eyelid. It may look like a pimple next to the eyelash. A stye can form inside the eyelid (internal stye) or outside the eyelid (external stye). A stye can cause redness, swelling, and pain on the eyelid. Your health care provider may be able to diagnose a stye just by examining your eye. Apply a warm, wet cloth (warm compress) to your eye for 5-10 minutes, 4 to 6 times a day. This information is not intended to replace advice given to you by your health care provider. Make sure you discuss any questions you have with your health care provider. Document Revised: 05/16/2020 Document Reviewed: 05/16/2020 Elsevier Patient Education  2024 [employer].

## 2023-02-12 NOTE — Progress Notes (Signed)
Subjective:  Patient ID: Todd [name], male    DOB: 1960-04-03  Age: 62 y.o. MRN: [id number]  CC:  Chief Complaint  Patient presents with   Hospitalization Follow-up    Pt notes he went to the ED after started having swelling behind the ears, notes the swelling and rash have resolved finding medication they provided cleared it up   Needs assistance with Doxycycline prescription     HPI Todd [name] presents for  Hospital/ER follow-up. Irritant contact dermatitis ER note reviewed from November 18.  Noted to have rash spreading from posterior right ear to upper forehead and posterior left neck.  Initially on the right posterior ear, went to urgent care a few days later given liquid doxycycline (trouble taking pills) for skin infection, told if he did not see improvement to follow-up in ED. In spite of taking doxycycline, rash worsened - went to ED on 11/18.  Did report using a new shampoo 2 weeks prior.  Also reported fever/chills - [id number]. Treated with ibuprofen.   Afebrile in the ER.  Exam noted possible mild skin redness, irritation of the scalp without macules papules fluctuance or drainage.  Diagnosed with possible mild contact or seborrheic dermatitis.  Recommended discontinuing the new shampoo.  Treated with fluocinolone solution twice daily, dermatology follow-up if no improvement.  Doxycycline discontinuation based on eval at that time.  Has been using steroid solution 2 times per day, felt dizzy 2 nights ago - no loc or seizure activity - just dizzy. Read online that steroid med could cause dizziness, so changed to once per day. Dizziness has improved. 80% gone. Rash improved.  Has continued to take doxycycline.  Rash is a lot better.    Was seen 9/17 at urgent care - diagnosed with hordeolum externum - Rx tobrex ophthalmic soln, [place] the [place] a [place] and a half, bump on eyelid remained the same. Saw eye specialist 1 week ago. Started on doxycycline pills for 10 days. he  had not started the pills, urgent care started on doxycycline solution as above.  Was advised by urgent care to use doxycycline solution for 10 days, but only 6 days of solution.  Appt with eye specialist in few weeks.   History Patient Active Problem List   Diagnosis Date Noted   Hearing loss of left ear 06/02/2022   Chronic anticoagulation 08/26/2021   Internal hemorrhoid, bleeding 08/26/2021   Prolapsed internal hemorrhoids, grade 3 08/26/2021   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 coronary artery disease 08/26/2021   NSTEMI (non-ST elevated myocardial infarction) (HCC) 07/20/2021   CAD (coronary artery disease) 07/18/2021   Hyperlipidemia 07/18/2021   Unstable angina (HCC)    Non-recurrent acute serous otitis media of right ear 10/14/2018   Shortness of breath 11/12/2016   Chest pain 11/12/2016   Heart palpitations 10/20/2013   Situational anxiety 10/20/2013   Migraine with visual aura 08/24/2013   TIA (transient ischemic attack) 08/23/2013   Blurred vision 08/23/2013   Anomia 08/23/2013   Past Medical History:  Diagnosis Date   CAD (coronary artery disease)    Complicated migraine    Deafness in left ear    Diverticulosis    External hemorrhoids    Headache    Hypertension    Internal hemorrhoids    Palpitations    Stroke (HCC)    TIA (transient ischemic attack)    Tubular adenoma of colon    Vision abnormalities    Past Surgical  History:  Procedure Laterality Date   APPENDECTOMY  12/01/11   lap appy   CORONARY STENT INTERVENTION N/A 07/17/2021   Procedure: CORONARY STENT INTERVENTION;  Surgeon: Kathleene Hazel, MD;  Location: [facility];  Service: Cardiovascular;  Laterality: N/A;   LAPAROSCOPIC APPENDECTOMY  12/02/2011   Procedure: APPENDECTOMY LAPAROSCOPIC;  Surgeon: Lodema Pilot, DO;  Location: [facility];  Service: General;  Laterality: N/A;   LEFT HEART CATH AND CORONARY ANGIOGRAPHY N/A 07/17/2021   Procedure:  LEFT HEART CATH AND CORONARY ANGIOGRAPHY;  Surgeon: Kathleene Hazel, MD;  Location: [facility];  Service: Cardiovascular;  Laterality: N/A;   Allergies  Allergen Reactions   Wellbutrin [Bupropion Hcl] Itching and Rash   Prior to Admission medications   Medication Sig Start Date End Date Taking? Authorizing Provider  aspirin EC 81 MG tablet Take 81 mg by mouth daily. Swallow whole.    [provider]  doxycycline (VIBRAMYCIN) 25 MG/5ML SUSR Take 20 mLs (100 mg total) by mouth 2 (two) times daily for 10 days. 02/07/23 02/17/23  Landis Martins, PA-C  fluocinolone (SYNALAR) 0.01 % external solution Apply topically 2 (two) times daily. For 2 weeks max 02/09/23   Prosperi, Christian H, PA-C  metoprolol succinate (TOPROL XL) 25 MG 24 hr tablet Take 1 tablet (25 mg total) by mouth daily. 07/14/22   Hilty, Lisette Abu, MD  nitroGLYCERIN (NITROSTAT) 0.4 MG SL tablet Place 1 tablet (0.4 mg total) under the tongue every 5 (five) minutes as needed for chest pain. 07/12/21 11/04/21  Alver Sorrow, NP  rosuvastatin (CRESTOR) 20 MG tablet TAKE 1 TABLET BY MOUTH EVERY DAY 01/05/23   Hilty, Lisette Abu, MD  tobramycin (TOBREX) 0.3 % ophthalmic solution Place 1 drop into the right eye every 6 (six) hours. 12/09/22   Mardella Layman, MD   Social History   Socioeconomic History   Marital status: Divorced    Spouse name: Not on file   Number of children: Not on file   Years of education: Not on file   Highest education level: Not on file  Occupational History   Occupation: part time  Tobacco Use   Smoking status: Never   Smokeless tobacco: Never  Vaping Use   Vaping status: Never Used  Substance and Sexual Activity   Alcohol use: No   Drug use: No   Sexual activity: Not Currently  Other Topics Concern   Not on file  Social History Narrative   Lives alone   Right Handed   Drinks very little caffeine   Social Determinants of Health   Financial Resource Strain: Not on file   Food Insecurity: Not on file  Transportation Needs: Not on file  Physical Activity: Not on file  Stress: Not on file  Social Connections: Not on file  Intimate Partner Violence: Not on file    Review of Systems Per HPI  Objective:   Vitals:   02/12/23 1131  BP: 116/70  Pulse: 60  Temp: 99 F (37.2 C)  TempSrc: Temporal  SpO2: 98%  Weight: 181 lb (82.1 kg)  Height: 5\' 7"  (1.702 m)     Physical Exam Constitutional:      General: He is not in acute distress.    Appearance: Normal appearance. He is well-developed.  HENT:     Head: Normocephalic and atraumatic.  Eyes:     Comments: Hordeolum of left lower lid, see photos.  No appreciable surrounding erythema or discharge.  Cardiovascular:  Rate and Rhythm: Normal rate.  Pulmonary:     Effort: Pulmonary effort is normal.  Skin:    Comments: Very faint erythematous patch behind right ear, remainder of scalp appears normal.  See photo.  Neurological:     Mental Status: He is alert and oriented to person, place, and time.  Psychiatric:        Mood and Affect: Mood normal.         Assessment & Plan:  Todd [name] is a 62 y.o. male . Rash  Hordeolum externum left lower eyelid  2 separate issues at this time.    Likely stye of left lower eyelid, noted in September.  Topical treatment at that time with persistent abnormality, seen by eye specialist last week, recommended doxycycline.  He did not fill the initial pills, and was seen 2 days later by urgent care where doxycycline suspension was ordered for rash, has been taking this instead of the pills.  On exam still appears to have external hordeolum without apparent sign of infection but will continue the doxycycline suspension as planned for additional 4 days, follow-up with ophthalmology as planned with RTC/ER precautions given.  Scalp rash likely contact dermatitis or irritant dermatitis given improvement using topical steroid.  Agree that doxycycline would  likely not helpful for that but will continue for reasons above.  Prior dizziness, unlikely related to the steroid suspension but symptoms have improved.  Rash has improved with daily dosing of topical steroid, okay to continue same plan for now with continued monitoring for resolution, RTC/ER precautions given.  Understanding of plan expressed, all questions answered.  No orders of the defined types were placed in this encounter.  Patient Instructions  Glad to hear the rash is improved.  It was likely due to contact dermatitis or possibly from previous shampoo, especially with the improvement using the steroid solution.  Okay to use that intermittently if needed until rash resolves.  If any worsening symptoms be seen.  Area on your left lower eyelid appears to be a stye, see information below.  I will refill the doxycycline as that was recommended by your eye specialist, but follow-up with them as planned.  Return to the clinic or go to the nearest emergency room if any of your symptoms worsen or new symptoms occur.     Signed,   Meredith Staggers, MD [facility], [facility] 02/12/23 12:18 PM

## 2023-02-23 ENCOUNTER — Other Ambulatory Visit: Payer: Self-pay

## 2023-02-23 ENCOUNTER — Observation Stay (HOSPITAL_COMMUNITY)
Admission: EM | Admit: 2023-02-23 | Discharge: 2023-02-25 | Disposition: A | Discharge status: Home / Self Care | Payer: Commercial Managed Care - HMO | Attending: Family Medicine | Admitting: Family Medicine

## 2023-02-23 ENCOUNTER — Encounter (HOSPITAL_COMMUNITY): Payer: Self-pay | Admitting: *Deleted

## 2023-02-23 ENCOUNTER — Ambulatory Visit (HOSPITAL_COMMUNITY)
Admission: EM | Admit: 2023-02-23 | Discharge: 2023-02-23 | Disposition: A | Discharge status: Home / Self Care | Payer: Commercial Managed Care - HMO | Attending: Family Medicine | Admitting: Family Medicine

## 2023-02-23 DIAGNOSIS — Z79899 Other long term (current) drug therapy: Secondary | ICD-10-CM | POA: Insufficient documentation

## 2023-02-23 DIAGNOSIS — J36 Peritonsillar abscess: Principal | ICD-10-CM | POA: Diagnosis present

## 2023-02-23 DIAGNOSIS — A419 Sepsis, unspecified organism: Secondary | ICD-10-CM | POA: Diagnosis not present

## 2023-02-23 DIAGNOSIS — I10 Essential (primary) hypertension: Secondary | ICD-10-CM | POA: Diagnosis present

## 2023-02-23 DIAGNOSIS — Z7982 Long term (current) use of aspirin: Secondary | ICD-10-CM | POA: Diagnosis not present

## 2023-02-23 DIAGNOSIS — I2510 Atherosclerotic heart disease of native coronary artery without angina pectoris: Secondary | ICD-10-CM | POA: Diagnosis present

## 2023-02-23 DIAGNOSIS — J039 Acute tonsillitis, unspecified: Secondary | ICD-10-CM

## 2023-02-23 DIAGNOSIS — E785 Hyperlipidemia, unspecified: Secondary | ICD-10-CM | POA: Diagnosis present

## 2023-02-23 DIAGNOSIS — R070 Pain in throat: Secondary | ICD-10-CM | POA: Diagnosis present

## 2023-02-23 LAB — POCT RAPID STREP A (OFFICE): Rapid Strep A Screen: NEGATIVE

## 2023-02-23 MED ORDER — LIDOCAINE VISCOUS HCL 2 % MT SOLN: 15.0000 mL | OROMUCOSAL | 0 refills | Status: DC | PRN

## 2023-02-23 MED ORDER — AMOXICILLIN 400 MG/5ML PO SUSR: 875.0000 mg | Freq: Two times a day (BID) | ORAL | 0 refills | Status: DC

## 2023-02-23 NOTE — Discharge Instructions (Signed)
Rapid strep was negative. Treating you for an infection of the tonsils.  Take medication as prescribed. If symptoms do not begin to improve within the next 72 hours return for re-evaluation. Complete the entire 10 days course of antibiotics.

## 2023-02-23 NOTE — ED Provider Notes (Signed)
[facility]    CSN: [id number] Arrival date & time: 02/23/23  4098      History   Chief Complaint Chief Complaint  Patient presents with   mouth pain    HPI Todd [name] is a 62 y.o. male.   HPI Patient with a history of diverticulitis, headaches, hypertension,  and CAD, resents today with a 1 day onset of right-sided throat and tongue pain.  Throat pain is exacerbated by swallowing.  He endorses tenderness when touching the right side of his neck.  Patient endorses pain traveling into the right ear.  Denies any associated URI symptoms.  He has been taking ibuprofen therefore is unaware if he has had any fever.  He denies any body aches versus a mild headache on yesterday which resolved with taking ibuprofen.  No history of strep.  No known sick exposures.  Past Medical History:  Diagnosis Date   CAD (coronary artery disease)    Complicated migraine    Deafness in left ear    Diverticulosis    External hemorrhoids    Headache    Hypertension    Internal hemorrhoids    Palpitations    Stroke [facility])    TIA (transient ischemic attack)    Tubular adenoma of colon    Vision abnormalities     Patient Active Problem List   Diagnosis Date Noted   Hearing loss of left ear 06/02/2022   Chronic anticoagulation 08/26/2021   Internal hemorrhoid, bleeding 08/26/2021   Prolapsed internal hemorrhoids, grade 3 08/26/2021   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 coronary artery disease 08/26/2021   NSTEMI (non-ST elevated myocardial infarction) (HCC) 07/20/2021   CAD (coronary artery disease) 07/18/2021   Hyperlipidemia 07/18/2021   Unstable angina (HCC)    Non-recurrent acute serous otitis media of right ear 10/14/2018   Shortness of breath 11/12/2016   Chest pain 11/12/2016   Heart palpitations 10/20/2013   Situational anxiety 10/20/2013   Migraine with visual aura 08/24/2013   TIA (transient ischemic attack) 08/23/2013   Blurred  vision 08/23/2013   Anomia 08/23/2013    Past Surgical History:  Procedure Laterality Date   APPENDECTOMY  12/01/11   lap appy   CORONARY STENT INTERVENTION N/A 07/17/2021   Procedure: CORONARY STENT INTERVENTION;  Surgeon: Kathleene Hazel, MD;  [facility];  Service: Cardiovascular;  Laterality: N/A;   LAPAROSCOPIC APPENDECTOMY  12/02/2011   Procedure: APPENDECTOMY LAPAROSCOPIC;  Surgeon: Lodema Pilot, DO;  Location: [facility];  Service: General;  Laterality: N/A;   LEFT HEART CATH AND CORONARY ANGIOGRAPHY N/A 07/17/2021   Procedure: LEFT HEART CATH AND CORONARY ANGIOGRAPHY;  Surgeon: Kathleene Hazel, MD;  [facility]: [facility];  Service: Cardiovascular;  Laterality: N/A;       Home Medications    Prior to Admission medications   Medication Sig Start Date End Date Taking? Authorizing Provider  amoxicillin (AMOXIL) 400 MG/5ML suspension Take 10.9 mLs (875 mg total) by mouth 2 (two) times daily for 10 days. 02/23/23 03/05/23 Yes Bing Neighbors, NP  aspirin EC 81 MG tablet Take 81 mg by mouth daily. Swallow whole.   Yes [provider]  lidocaine (XYLOCAINE) 2 % solution Use as directed 15 mLs in the mouth or throat every 3 (three) hours as needed (throat pain. Gargle and spit as needed for pain). 02/23/23  Yes Bing Neighbors, NP  metoprolol succinate (TOPROL XL) 25 MG 24 hr tablet Take 1  tablet (25 mg total) by mouth daily. 07/14/22  Yes Hilty, Lisette Abu, MD  rosuvastatin (CRESTOR) 20 MG tablet TAKE 1 TABLET BY MOUTH EVERY DAY 01/05/23  Yes Hilty, Lisette Abu, MD  doxycycline (VIBRAMYCIN) 25 MG/5ML SUSR 20ml pop BID for 4 additional days to complete 10 day course. 02/12/23   Shade Flood, MD  fluocinolone (SYNALAR) 0.01 % external solution Apply topically 2 (two) times daily. For 2 weeks max 02/09/23   Prosperi, Christian H, PA-C  nitroGLYCERIN (NITROSTAT) 0.4 MG SL tablet Place 1 tablet (0.4 mg total) under the tongue every 5 (five)  minutes as needed for chest pain. 07/12/21 11/04/21  Alver Sorrow, NP  tobramycin (TOBREX) 0.3 % ophthalmic solution Place 1 drop into the right eye every 6 (six) hours. 12/09/22   Mardella Layman, MD    Family History Family History  Problem Relation Age of Onset   Hypertension Mother    Hypertension Father    Anemia Neg Hx    Arrhythmia Neg Hx    Asthma Neg Hx    Clotting disorder Neg Hx    Fainting Neg Hx    Heart attack Neg Hx    Heart disease Neg Hx    Heart failure Neg Hx    Hyperlipidemia Neg Hx    Colon cancer Neg Hx    Esophageal cancer Neg Hx    Stomach cancer Neg Hx    Rectal cancer Neg Hx     Social History Social History   Tobacco Use   Smoking status: Never   Smokeless tobacco: Never  Vaping Use   Vaping status: Never Used  Substance Use Topics   Alcohol use: No   Drug use: No     Allergies   Wellbutrin [bupropion hcl]   Review of Systems Review of Systems Pertinent negatives listed in HPI   Physical Exam Triage Vital Signs ED Triage Vitals  Encounter Vitals Group     BP 02/23/23 0920 123/78     Systolic BP Percentile --      Diastolic BP Percentile --      Pulse Rate 02/23/23 0920 81     Resp 02/23/23 0920 18     Temp 02/23/23 0920 98.1 F (36.7 C)     Temp src --      SpO2 02/23/23 0920 96 %     Weight --      Height --      Head Circumference --      Peak Flow --      Pain Score 02/23/23 0918 6     Pain Loc --      Pain Education --      Exclude from Growth Chart --    No data found.  Updated Vital Signs BP 123/78   Pulse 81   Temp 98.1 F (36.7 C)   Resp 18   SpO2 96%   Visual Acuity Right Eye Distance:   Left Eye Distance:   Bilateral Distance:    Right Eye Near:   Left Eye Near:    Bilateral Near:     Physical Exam Vitals reviewed.  Constitutional:      Appearance: Normal appearance. He is ill-appearing.  HENT:     Head: Normocephalic and atraumatic.     Mouth/Throat:     Mouth: Mucous membranes are  moist.     Pharynx: Posterior oropharyngeal erythema and uvula swelling present. No oropharyngeal exudate or postnasal drip.     Tonsils: No tonsillar exudate.  3+ on the right. 2+ on the left.  Cardiovascular:     Rate and Rhythm: Normal rate and regular rhythm.  Pulmonary:     Effort: Pulmonary effort is normal.     Breath sounds: Normal breath sounds.  Musculoskeletal:        General: Normal range of motion.     Cervical back: Normal range of motion.  Lymphadenopathy:     Cervical: Cervical adenopathy present.  Skin:    General: Skin is warm.  Neurological:     General: No focal deficit present.      UC Treatments / Results  Labs (all labs ordered are listed, but only abnormal results are displayed) Labs Reviewed  POCT RAPID STREP A (OFFICE)    EKG   Radiology No results found.  Procedures Procedures (including critical care time)  Medications Ordered in UC Medications - No data to display  Initial Impression / Assessment and Plan / UC Course  I have reviewed the triage vital signs and the nursing notes.  Pertinent labs & imaging results that were available during my care of the patient were reviewed by me and considered in my medical decision making (see chart for details).    Treating for acute tonsillitis, patient requested liquid form of antibiotics.  Treating with amoxicillin 875 twice daily for 10 days.  Lidocaine viscous gargle and spit every 2 hours as needed for throat pain.  Rapid strep was negative symptoms are localized to 1 side of the mouth therefore patient encouraged that if his symptoms do not begin to improve within 3 days to come back in for reevaluation unable to rule out peritonsillar abscess as the source of symptoms.  Patient has significant cervical adenopathy unilaterally on the right side only.  Patient is currently afebrile.  Patient verbalized understanding agreement with plan will return if symptoms worsen or do not improve. Final Clinical  Impressions(s) / UC Diagnoses   Final diagnoses:  Acute tonsillitis, unspecified etiology     Discharge Instructions      Rapid strep was negative. Treating you for an infection of the tonsils.  Take medication as prescribed. If symptoms do not begin to improve within the next 72 hours return for re-evaluation. Complete the entire 10 days course of antibiotics.    ED Prescriptions     Medication Sig Dispense Auth. Provider   amoxicillin (AMOXIL) 400 MG/5ML suspension Take 10.9 mLs (875 mg total) by mouth 2 (two) times daily for 10 days. 218 mL Bing Neighbors, NP   lidocaine (XYLOCAINE) 2 % solution Use as directed 15 mLs in the mouth or throat every 3 (three) hours as needed (throat pain. Gargle and spit as needed for pain). 100 mL Bing Neighbors, NP      PDMP not reviewed this encounter.   Bing Neighbors, NP 02/23/23 1043

## 2023-02-23 NOTE — ED Triage Notes (Signed)
Pt reports Rt sided mouth ,tongue pain that started yesterday. Pt reports sore throat when swallowing.

## 2023-02-24 ENCOUNTER — Encounter (HOSPITAL_COMMUNITY): Payer: Self-pay

## 2023-02-24 ENCOUNTER — Emergency Department (HOSPITAL_COMMUNITY): Payer: Commercial Managed Care - HMO

## 2023-02-24 ENCOUNTER — Other Ambulatory Visit: Payer: Self-pay

## 2023-02-24 DIAGNOSIS — I25110 Atherosclerotic heart disease of native coronary artery with unstable angina pectoris: Secondary | ICD-10-CM | POA: Diagnosis not present

## 2023-02-24 DIAGNOSIS — I10 Essential (primary) hypertension: Secondary | ICD-10-CM

## 2023-02-24 DIAGNOSIS — A419 Sepsis, unspecified organism: Secondary | ICD-10-CM | POA: Diagnosis not present

## 2023-02-24 DIAGNOSIS — J36 Peritonsillar abscess: Secondary | ICD-10-CM | POA: Diagnosis not present

## 2023-02-24 DIAGNOSIS — E782 Mixed hyperlipidemia: Secondary | ICD-10-CM

## 2023-02-24 LAB — CBC WITH DIFFERENTIAL/PLATELET
Abs Immature Granulocytes: 0.08 10*3/uL — ABNORMAL HIGH (ref 0.00–0.07)
Basophils Absolute: 0.1 10*3/uL (ref 0.0–0.1)
Basophils Relative: 0 %
Eosinophils Absolute: 0.1 10*3/uL (ref 0.0–0.5)
Eosinophils Relative: 1 %
HCT: 44.5 % (ref 39.0–52.0)
Hemoglobin: 15.2 g/dL (ref 13.0–17.0)
Immature Granulocytes: 1 %
Lymphocytes Relative: 10 %
Lymphs Abs: 1.4 10*3/uL (ref 0.7–4.0)
MCH: 29.6 pg (ref 26.0–34.0)
MCHC: 34.2 g/dL (ref 30.0–36.0)
MCV: 86.6 fL (ref 80.0–100.0)
Monocytes Absolute: 1.1 10*3/uL — ABNORMAL HIGH (ref 0.1–1.0)
Monocytes Relative: 8 %
Neutro Abs: 11.6 10*3/uL — ABNORMAL HIGH (ref 1.7–7.7)
Neutrophils Relative %: 80 %
Platelets: 223 10*3/uL (ref 150–400)
RBC: 5.14 MIL/uL (ref 4.22–5.81)
RDW: 12 % (ref 11.5–15.5)
WBC: 14.3 10*3/uL — ABNORMAL HIGH (ref 4.0–10.5)
nRBC: 0 % (ref 0.0–0.2)

## 2023-02-24 LAB — COMPREHENSIVE METABOLIC PANEL
ALT: 31 U/L (ref 0–44)
AST: 26 U/L (ref 15–41)
Albumin: 3.6 g/dL (ref 3.5–5.0)
Alkaline Phosphatase: 57 U/L (ref 38–126)
Anion gap: 8 (ref 5–15)
BUN: 11 mg/dL (ref 6–20)
CO2: 24 mmol/L (ref 22–32)
Calcium: 9.2 mg/dL (ref 8.9–10.3)
Chloride: 105 mmol/L (ref 98–111)
Creatinine, Ser: 0.94 mg/dL (ref 0.61–1.24)
GFR, Estimated: >60 mL/min (ref >60)
Glucose, Bld: 115 mg/dL — ABNORMAL HIGH (ref 70–99)
Potassium: 4.3 mmol/L (ref 3.5–5.1)
Sodium: 137 mmol/L (ref 135–145)
Total Bilirubin: 2.2 mg/dL — ABNORMAL HIGH (ref <1.2)
Total Protein: 6.7 g/dL (ref 6.5–8.1)

## 2023-02-24 LAB — RESP PANEL BY RT-PCR (RSV, FLU A&B, COVID)  RVPGX2
Influenza A by PCR: NEGATIVE
Influenza B by PCR: NEGATIVE
Resp Syncytial Virus by PCR: NEGATIVE
SARS Coronavirus 2 by RT PCR: NEGATIVE

## 2023-02-24 LAB — LACTIC ACID, PLASMA: Lactic Acid, Venous: 1 mmol/L (ref 0.5–1.9)

## 2023-02-24 LAB — HIV ANTIBODY (ROUTINE TESTING W REFLEX): HIV Screen 4th Generation wRfx: NONREACTIVE

## 2023-02-24 LAB — GROUP A STREP BY PCR: Group A Strep by PCR: NOT DETECTED

## 2023-02-24 MED ORDER — SODIUM CHLORIDE 0.9 % IV SOLN
3.0000 g | Freq: Four times a day (QID) | INTRAVENOUS | Status: DC
  Administered 2023-02-24 – 2023-02-25 (×4): 3 g via INTRAVENOUS
  Filled 2023-02-24 (×4): qty 8

## 2023-02-24 MED ORDER — IOHEXOL 350 MG/ML SOLN
75.0000 mL | Freq: Once | INTRAVENOUS | Status: AC | PRN
  Administered 2023-02-24: 75 mL via INTRAVENOUS

## 2023-02-24 MED ORDER — PHENOL 1.4 % MT LIQD
1.0000 | OROMUCOSAL | Status: DC | PRN
  Filled 2023-02-24: qty 177

## 2023-02-24 MED ORDER — ENOXAPARIN SODIUM 40 MG/0.4ML IJ SOSY
40.0000 mg | PREFILLED_SYRINGE | INTRAMUSCULAR | Status: DC
  Administered 2023-02-25: 40 mg via SUBCUTANEOUS
  Filled 2023-02-24 (×2): qty 0.4

## 2023-02-24 MED ORDER — ROSUVASTATIN CALCIUM 20 MG PO TABS
20.0000 mg | ORAL_TABLET | Freq: Every day | ORAL | Status: DC
  Administered 2023-02-25: 20 mg via ORAL
  Filled 2023-02-24 (×2): qty 1

## 2023-02-24 MED ORDER — ONDANSETRON HCL 4 MG PO TABS: 4.0000 mg | ORAL_TABLET | Freq: Four times a day (QID) | ORAL | Status: DC | PRN

## 2023-02-24 MED ORDER — METOPROLOL SUCCINATE ER 25 MG PO TB24
25.0000 mg | ORAL_TABLET | Freq: Every day | ORAL | Status: DC
  Administered 2023-02-25: 25 mg via ORAL
  Filled 2023-02-24 (×2): qty 1

## 2023-02-24 MED ORDER — SODIUM CHLORIDE 0.9% FLUSH
3.0000 mL | Freq: Two times a day (BID) | INTRAVENOUS | Status: DC
  Administered 2023-02-24 – 2023-02-25 (×3): 3 mL via INTRAVENOUS

## 2023-02-24 MED ORDER — SODIUM CHLORIDE 0.9 % IV SOLN: INTRAVENOUS | Status: DC

## 2023-02-24 MED ORDER — ALBUTEROL SULFATE (2.5 MG/3ML) 0.083% IN NEBU: 2.5000 mg | INHALATION_SOLUTION | RESPIRATORY_TRACT | Status: DC | PRN

## 2023-02-24 MED ORDER — DEXAMETHASONE SODIUM PHOSPHATE 10 MG/ML IJ SOLN
10.0000 mg | Freq: Once | INTRAMUSCULAR | Status: AC
  Administered 2023-02-24: 10 mg via INTRAVENOUS
  Filled 2023-02-24: qty 1

## 2023-02-24 MED ORDER — CLINDAMYCIN PHOSPHATE 900 MG/50ML IV SOLN
900.0000 mg | Freq: Once | INTRAVENOUS | Status: AC
  Administered 2023-02-24: 900 mg via INTRAVENOUS
  Filled 2023-02-24: qty 50

## 2023-02-24 MED ORDER — KETOROLAC TROMETHAMINE 30 MG/ML IJ SOLN
30.0000 mg | Freq: Four times a day (QID) | INTRAMUSCULAR | Status: DC | PRN
  Administered 2023-02-24 – 2023-02-25 (×2): 30 mg via INTRAVENOUS
  Filled 2023-02-24 (×2): qty 1

## 2023-02-24 MED ORDER — ONDANSETRON HCL 4 MG/2ML IJ SOLN: 4.0000 mg | Freq: Four times a day (QID) | INTRAMUSCULAR | Status: DC | PRN

## 2023-02-24 MED ORDER — HYDRALAZINE HCL 20 MG/ML IJ SOLN: 10.0000 mg | INTRAMUSCULAR | Status: DC | PRN

## 2023-02-24 MED ORDER — ASPIRIN 81 MG PO TBEC
81.0000 mg | DELAYED_RELEASE_TABLET | Freq: Every day | ORAL | Status: DC
  Administered 2023-02-25: 81 mg via ORAL
  Filled 2023-02-24 (×2): qty 1

## 2023-02-24 MED ORDER — ACETAMINOPHEN 650 MG RE SUPP: 650.0000 mg | Freq: Four times a day (QID) | RECTAL | Status: DC | PRN

## 2023-02-24 MED ORDER — DEXAMETHASONE SODIUM PHOSPHATE 4 MG/ML IJ SOLN
4.0000 mg | INTRAMUSCULAR | Status: DC
  Administered 2023-02-25: 4 mg via INTRAVENOUS
  Filled 2023-02-24: qty 1

## 2023-02-24 MED ORDER — ACETAMINOPHEN 325 MG PO TABS
650.0000 mg | ORAL_TABLET | Freq: Four times a day (QID) | ORAL | Status: DC | PRN
  Filled 2023-02-24: qty 2

## 2023-02-24 NOTE — H&P (Addendum)
History and Physical    Patient: Todd [name] JXB:[id number] DOB: 09-15-1960 DOA: 02/23/2023 DOS: the patient was seen and examined on 02/24/2023 PCP: Shade Flood, MD  Patient coming from: Home  Chief Complaint:  Chief Complaint  Patient presents with   Sore Throat   HPI: Todd [name] is a 62 y.o. male with medical history significant of hypertension, hyperlipidemia, CAD s/p stent, and prior stroke who presents with sore throat.  Initially noted swelling on the right side of his face.  Symptoms initially started 3 days ago.  Since the onset of symptoms he had reported progressively worsening swelling and pain.  He reported having subjective fevers, but when checked reporting a maximum temperature of 99 F.  The patient states he was able to swallow soup initially, but now has increased difficulty swallowing for which he has been unable to eat any food.  He has been frequently spitting, but reports that he is able to still swallow liquids.  He was a evaluated by his primary care provider yesterday and started on amoxicillin, but patient did not report any improvement in symptoms after taking the medication which he came to the hospital for further evaluation.  Denies any recent chest pain, shortness of breath, nausea, vomiting, or diarrhea.  Of note, the patient reports a recent history of a rash behind his ear 2-3 weeks ago, which was initially misdiagnosed as an infection and treated with doxycycline. Upon visiting the emergency room, it was determined to be a rash rather than an infection, and he was prescribed a steroid lotion which resolved the issue.   In the emergency department patient was noted to be afebrile with heart rate 81-105, and all other vital signs relatively maintained.  Labs significant for WBC 14.3 and total bilirubin 2.2.  Rapid strep screen was negative.  Influenza, COVID-19, and RSV screening were negative.  CT scan of the soft tissues of the neck noted a multi locular  right palatine tonsil abscess collectively up to 2 cm in diameter with retropharyngeal edema and multiple dental caries.  Dr. Jearld Fenton of ENT had been consulted and evaluated the patient for I&D.  Patient deferred I&D for trial of medical management with antibiotics first.  Patient had been given Decadron 10 mg IV and clindamycin and 900 mg IV.  ENT recommended Unasyn and Decadron Review of Systems: As mentioned in the history of present illness. All other systems reviewed and are negative. Past Medical History:  Diagnosis Date   CAD (coronary artery disease)    Complicated migraine    Deafness in left ear    Diverticulosis    External hemorrhoids    Headache    Hypertension    Internal hemorrhoids    Palpitations    Stroke [facility])    TIA (transient ischemic attack)    Tubular adenoma of colon    Vision abnormalities    Past Surgical History:  Procedure Laterality Date   APPENDECTOMY  12/01/11   lap appy   CORONARY STENT INTERVENTION N/A 07/17/2021   Procedure: CORONARY STENT INTERVENTION;  Surgeon: Kathleene Hazel, MD;  [facility];  Service: Cardiovascular;  Laterality: N/A;   LAPAROSCOPIC APPENDECTOMY  12/02/2011   Procedure: APPENDECTOMY LAPAROSCOPIC;  Surgeon: Lodema Pilot, DO;  Location: [facility];  Service: General;  Laterality: N/A;   LEFT HEART CATH AND CORONARY ANGIOGRAPHY N/A 07/17/2021   Procedure: LEFT HEART CATH AND CORONARY ANGIOGRAPHY;  Surgeon: Kathleene Hazel, MD;  [facility];  Service:  Cardiovascular;  Laterality: N/A;   Social History:  reports that he has never smoked. He has never used smokeless tobacco. He reports that he does not drink alcohol and does not use drugs.  Allergies  Allergen Reactions   Wellbutrin [Bupropion Hcl] Itching and Rash    Family History  Problem Relation Age of Onset   Hypertension Mother    Hypertension Father    Anemia Neg Hx    Arrhythmia Neg Hx    Asthma Neg Hx    Clotting disorder Neg  Hx    Fainting Neg Hx    Heart attack Neg Hx    Heart disease Neg Hx    Heart failure Neg Hx    Hyperlipidemia Neg Hx    Colon cancer Neg Hx    Esophageal cancer Neg Hx    Stomach cancer Neg Hx    Rectal cancer Neg Hx     Prior to Admission medications   Medication Sig Start Date End Date Taking? Authorizing Provider  amoxicillin (AMOXIL) 400 MG/5ML suspension Take 10.9 mLs (875 mg total) by mouth 2 (two) times daily for 10 days. 02/23/23 03/05/23  Bing Neighbors, NP  aspirin EC 81 MG tablet Take 81 mg by mouth daily. Swallow whole.    [provider]  doxycycline (VIBRAMYCIN) 25 MG/5ML SUSR 20ml pop BID for 4 additional days to complete 10 day course. 02/12/23   Shade Flood, MD  fluocinolone (SYNALAR) 0.01 % external solution Apply topically 2 (two) times daily. For 2 weeks max 02/09/23   Prosperi, Christian H, PA-C  lidocaine (XYLOCAINE) 2 % solution Use as directed 15 mLs in the mouth or throat every 3 (three) hours as needed (throat pain. Gargle and spit as needed for pain). 02/23/23   Bing Neighbors, NP  metoprolol succinate (TOPROL XL) 25 MG 24 hr tablet Take 1 tablet (25 mg total) by mouth daily. 07/14/22   Hilty, Lisette Abu, MD  nitroGLYCERIN (NITROSTAT) 0.4 MG SL tablet Place 1 tablet (0.4 mg total) under the tongue every 5 (five) minutes as needed for chest pain. 07/12/21 11/04/21  Alver Sorrow, NP  rosuvastatin (CRESTOR) 20 MG tablet TAKE 1 TABLET BY MOUTH EVERY DAY 01/05/23   Hilty, Lisette Abu, MD  tobramycin (TOBREX) 0.3 % ophthalmic solution Place 1 drop into the right eye every 6 (six) hours. 12/09/22   Mardella Layman, MD    Physical Exam: Vitals:   02/24/23 0630 02/24/23 0645 02/24/23 0715 02/24/23 0730  BP: 121/75 118/75 119/77 124/77  Pulse: 93 93 87 92  Resp:    17  Temp:      TempSrc:      SpO2: 96% 95% 94% 96%     Constitutional: Older male who appears to be in no acute distress at this time. Eyes: PERRL, lids and conjunctivae  normal ENMT: Mucous membranes are moist.  Fair dentition. Neck: normal, supple, submandibular lymphadenopathy appreciated on the right with mild swelling of the right side of the face. Respiratory: clear to auscultation bilaterally, no wheezing, no crackles. Normal respiratory effort. No accessory muscle use.  Cardiovascular: Regular rate and rhythm, no murmurs / rubs / gallops. No extremity edema.   Abdomen: no tenderness, no masses palpated.  Bowel sounds positive.  Musculoskeletal: no clubbing / cyanosis. No joint deformity upper and lower extremities. Good ROM, no contractures. Normal muscle tone.  Skin: no rashes, lesions, ulcers. No induration Neurologic: CN 2-12 grossly intact. Sensation intact, DTR normal. Strength 5/5 in all 4.  Psychiatric: Normal judgment and insight. Alert and oriented x 3. Normal mood.   Data Reviewed:  Reviewed labs, imaging, and pertinent records as documented.  Assessment and Plan:  SIRS/sepsis secondary to Right palatine tonsil abscess Acute.  Patient presents with complaints of sore throat.  Strep throat screen was negative.  Found to have a 2 cm right patent tonsillar abscess on CT imaging of the neck.  He was noted to be tachycardic with elevated white blood cell count concerning for sepsis.  Patient had been evaluated by Dr. Jearld Fenton of ENT who recommended I&D, but patient deferred for trial of antibiotics.  Patient had been given Decadron 10 mg IV and clindamycin initially.  ENT recommended Unasyn and Decadron. -Admit to a telemetry bed -Aspiration precautions with elevation head of bed -Check single blood culture (post antibiotics) -Add-on lactic acid  -Changed antibiotics to Unasyn per ENT recs -Decadron IV, but deferred to ENT for dosing recommendations -Chloraseptic spray as needed -Toradol IV as needed for pain -Recheck CBC in a.m. -Appreciate ENT consultative services, will follow-up for any further recommendations  Essential  hypertension Blood pressures currently maintained 118/75 to 145/83. -Continue metoprolol as tolerated -Hydralazine IV as needed for elevated blood pressure  CAD Patient with history of coronary artery disease status post PCI to the mid LAD with DES 06/2021.  He had been on dual antiplatelet therapy with aspirin and Plavix following the procedure, but had been recommended to continue on aspirin alone after 1 year follow-up. -Continue aspirin and statin  Hyperlipidemia -Continue Crestor  Hyperbilirubinemia Acute.  Total bilirubin noted to be  elevated at 2.2.  All other LFTs appear to be within normal limits.  DVT prophylaxis: Lovenox Advance Care Planning:   Code Status: Full Code   Consults: ENT  Family Communication: None  Severity of Illness: The appropriate patient status for this patient is INPATIENT. Inpatient status is judged to be reasonable and necessary in order to provide the required intensity of service to ensure the patient's safety. The patient's presenting symptoms, physical exam findings, and initial radiographic and laboratory data in the context of their chronic comorbidities is felt to place them at high risk for further clinical deterioration. Furthermore, it is not anticipated that the patient will be medically stable for discharge from the hospital within 2 midnights of admission.   * I certify that at the point of admission it is my clinical judgment that the patient will require inpatient hospital care spanning beyond 2 midnights from the point of admission due to high intensity of service, high risk for further deterioration and high frequency of surveillance required.*  Author: Clydie Braun, MD 02/24/2023 8:00 AM  For on call review [email or web address].

## 2023-02-24 NOTE — Progress Notes (Signed)
Pharmacy Antibiotic Note  Todd [name] is a 62 y.o. male admitted on 02/23/2023 presenting with swollen throat, concern for peritonsillar abscess.  Pharmacy has been consulted for unasyn dosing.  Plan: Unasyn 3g IV q 6 h Monitor renal function, clinical progression and LOT     Temp (24hrs), Avg:98.4 F (36.9 C), Min:98.1 F (36.7 C), Max:98.7 F (37.1 C)  Recent Labs  Lab 02/24/23 0028  WBC 14.3*  CREATININE 0.94    Estimated Creatinine Clearance: 85.7 mL/min (by C-G formula based on SCr of 0.94 mg/dL).    Allergies  Allergen Reactions   Wellbutrin [Bupropion Hcl] Itching and Rash    Daylene Posey, PharmD, [facility] Clinical Pharmacist ED Pharmacist Phone # [phone] 02/24/2023 8:50 AM

## 2023-02-24 NOTE — ED Provider Notes (Signed)
[place] EMERGENCY DEPARTMENT AT [facility] Provider Note   CSN: [id number] Arrival date & time: 02/23/23  2353     History  Chief Complaint  Patient presents with   Sore Throat    Todd [name] is a 62 y.o. male.  Patient complaining of sore throat with tonsillar swelling.  He was diagnosed with tonsillitis in urgent care earlier today and prescribed antibiotics.  He states he has taken 2 doses of antibiotics but continues to have severe pain and swelling.  He does endorse some mild difficulty with swallowing but denies any difficulty with breathing.  He denies shortness of breath, chest pain, fevers.  Past medical history sniffer TIA, migraines, anxiety, unstable angina, coronary artery disease, NSTEMI, hearing loss of left ear   Sore Throat       Home Medications Prior to Admission medications   Medication Sig Start Date End Date Taking? Authorizing Provider  amoxicillin (AMOXIL) 400 MG/5ML suspension Take 10.9 mLs (875 mg total) by mouth 2 (two) times daily for 10 days. 02/23/23 03/05/23  Bing Neighbors, NP  aspirin EC 81 MG tablet Take 81 mg by mouth daily. Swallow whole.    [provider]  doxycycline (VIBRAMYCIN) 25 MG/5ML SUSR 20ml pop BID for 4 additional days to complete 10 day course. 02/12/23   Shade Flood, MD  fluocinolone (SYNALAR) 0.01 % external solution Apply topically 2 (two) times daily. For 2 weeks max 02/09/23   Prosperi, Christian H, PA-C  lidocaine (XYLOCAINE) 2 % solution Use as directed 15 mLs in the mouth or throat every 3 (three) hours as needed (throat pain. Gargle and spit as needed for pain). 02/23/23   Bing Neighbors, NP  metoprolol succinate (TOPROL XL) 25 MG 24 hr tablet Take 1 tablet (25 mg total) by mouth daily. 07/14/22   Hilty, Lisette Abu, MD  nitroGLYCERIN (NITROSTAT) 0.4 MG SL tablet Place 1 tablet (0.4 mg total) under the tongue every 5 (five) minutes as needed for chest pain. 07/12/21 11/04/21  Alver Sorrow,  NP  rosuvastatin (CRESTOR) 20 MG tablet TAKE 1 TABLET BY MOUTH EVERY DAY 01/05/23   Hilty, Lisette Abu, MD  tobramycin (TOBREX) 0.3 % ophthalmic solution Place 1 drop into the right eye every 6 (six) hours. 12/09/22   Mardella Layman, MD      Allergies    Wellbutrin [bupropion hcl]    Review of Systems   Review of Systems  Physical Exam Updated Vital Signs BP 132/75   Pulse 89   Temp 98.3 F (36.8 C) (Oral)   Resp 16   SpO2 98%  Physical Exam Vitals reviewed.  Constitutional:      Appearance: Normal appearance. He is ill-appearing.  HENT:     Head: Normocephalic and atraumatic.     Mouth/Throat:     Mouth: Mucous membranes are moist.     Pharynx: Posterior oropharyngeal erythema and uvula swelling present. No oropharyngeal exudate or postnasal drip.     Tonsils: No tonsillar exudate. 3+ on the right. 2+ on the left.  Cardiovascular:     Rate and Rhythm: Normal rate and regular rhythm.  Pulmonary:     Effort: Pulmonary effort is normal.     Breath sounds: Normal breath sounds.  Musculoskeletal:        General: Normal range of motion.     Cervical back: Normal range of motion.  Lymphadenopathy:     Cervical: Cervical adenopathy present.  Skin:    General: Skin is warm.  Neurological:     General: No focal deficit present.     ED Results / Procedures / Treatments   Labs (all labs ordered are listed, but only abnormal results are displayed) Labs Reviewed  COMPREHENSIVE METABOLIC PANEL - Abnormal; Notable for the following components:      Result Value   Glucose, Bld 115 (*)    Total Bilirubin 2.2 (*)    All other components within normal limits  CBC WITH DIFFERENTIAL/PLATELET - Abnormal; Notable for the following components:   WBC 14.3 (*)    Neutro Abs 11.6 (*)    Monocytes Absolute 1.1 (*)    Abs Immature Granulocytes 0.08 (*)    All other components within normal limits  GROUP A STREP BY PCR  RESP PANEL BY RT-PCR (RSV, FLU A&B, COVID)  [id number]     EKG None  Radiology CT Soft Tissue Neck W Contrast  Result Date: 02/24/2023 CLINICAL DATA:  62 year old male with throat swelling. Severe ear pain on the right. EXAM: CT NECK WITH CONTRAST TECHNIQUE: Multidetector CT imaging of the neck was performed using the standard protocol following the bolus administration of intravenous contrast. RADIATION DOSE REDUCTION: This exam was performed according to the departmental dose-optimization program which includes automated exposure control, adjustment of the mA and/or kV according to patient size and/or use of iterative reconstruction technique. CONTRAST:  75mL OMNIPAQUE IOHEXOL 350 MG/ML SOLN COMPARISON:  Neck CT 03/29/2022. FINDINGS: Pharynx and larynx: Multilocular right palatine tonsillar abscess (series 3, image 61) with rim enhancing complex fluid associated with enlarged right palatine tonsil, edematous right lateral pharyngeal wall and soft palate, inflammation in the adjacent right parapharyngeal space. The abscess encompasses up to 2 cm collectively (sagittal image 39). And there are small superimposed dystrophic calcifications including in the more caudal loculated component. Associated swelling of the right hypopharynx and supraglottic larynx although the epiglottis remains normal. No airway narrowing. Adenoids and left tonsil/pharyngeal wall remain within normal limits. Only mild lingual tonsil hypertrophy. Mild retropharyngeal space edema. No other organized fluid collection or abscess. Salivary glands: Minimal secondary inflammation in the right submandibular space. The submandibular glands remain symmetric. Negative sublingual space. Negative parotid glands. Thyroid: Negative. Lymph nodes: Mild reactive appearing right level 2 lymph nodes are individually up to 9 mm short axis. Small but conspicuous and hyperenhancing retropharyngeal lymph nodes such as on series 3, image 48. Other cervical nodal stations are within normal limits. Vascular: Major  vascular structures in the neck and at the skull base remain patent including the right carotid and right IJ. But note a partially retropharyngeal course of the proximal right ICA on series 3, image 68. Adjacent multi spatial inflammation. Mild for age atherosclerosis, limited to calcified plaque at the skull base. Limited intracranial: Negative. Visualized orbits: Negative. Mastoids and visualized paranasal sinuses: Mild paranasal sinus mucosal thickening. Middle ears and mastoids appear to remain well aerated. Skeleton: Stable visualized osseous structures. Chronic dental inflammatory changes, especially carious left maxillary anterior molar. Cervical spine degeneration. Upper chest: Mild respiratory motion, negative visible upper lungs, trachea and carina. Mild Calcified aortic atherosclerosis. Otherwise negative visible superior mediastinum. IMPRESSION: 1. Positive for Multilocular Right Palatine Tonsil Abscess, collectively up to 2 cm diameter. Regional inflammation including retropharyngeal space edema. Edema affecting the right hypopharynx and supraglottic larynx but epiglottis remains normal. Mild reactive lymphadenopathy. 2. No other complicating features. But note partially retropharyngeal course of the Right ICA. 3. Carious dentition. Electronically Signed   By: Odessa Fleming M.D.   On: 02/24/2023 05:03  Procedures Procedures    Medications Ordered in ED Medications  iohexol (OMNIPAQUE) 350 MG/ML injection 75 mL (75 mLs Intravenous Contrast Given 02/24/23 0435)  dexamethasone (DECADRON) injection 10 mg (10 mg Intravenous Given 02/24/23 0558)  clindamycin (CLEOCIN) IVPB 900 mg (900 mg Intravenous New Bag/Given 02/24/23 0601)    ED Course/ Medical Decision Making/ A&P                                 Medical Decision Making Amount and/or Complexity of Data Reviewed Labs: ordered. Radiology: ordered.  Risk Prescription drug management.   This patient presents to the ED for concern of sore  throat, this involves an extensive number of treatment options, and is a complaint that carries with it a high risk of complications and morbidity.  The differential diagnosis includes PTA, retropharyngeal abscess, strep throat, viral illness, others    Co morbidities that complicate the patient evaluation  TIA, anxiety   Additional history obtained:   External records from outside source obtained and reviewed including urgent care notes   Lab Tests:  I Ordered, and personally interpreted labs.  The pertinent results include: WBC 14,300, negative strep test, negative respiratory panel   Imaging Studies ordered:  I ordered imaging studies including CT soft tissue neck with contrast I independently visualized and interpreted imaging which showed  1. Positive for Multilocular Right Palatine Tonsil Abscess,  collectively up to 2 cm diameter. Regional inflammation including  retropharyngeal space edema. Edema affecting the right hypopharynx  and supraglottic larynx but epiglottis remains normal. Mild reactive  lymphadenopathy.  2. No other complicating features. But note partially  retropharyngeal course of the Right ICA.  3. Carious dentition.   I agree with the radiologist interpretation   Consultations Obtained:  I requested consultation with the otolaryngologist, Dr. Jearld Fenton, and discussed lab and imaging findings as well as pertinent plan - they recommend: Plan to see patient at bedside for likely drainage   Problem List / ED Course / Critical interventions / Medication management   I ordered medication including clindamycin and Decadron for tonsillar abscess Reevaluation of the patient after these medicines showed that the patient improved I have reviewed the patients home medicines and have made adjustments as needed   Test / Admission - Considered:  Dr. Jearld Fenton plans to possibly drain the abscess at bedside.  Patient care will be transferred to [facility], PA-C at  shift handoff.  Final disposition pending evaluation by ENT at bedside.         Final Clinical Impression(s) / ED Diagnoses Final diagnoses:  Tonsillar abscess    Rx / DC Orders ED Discharge Orders     None         Pamala Duffel 02/24/23 0865    Glynn Octave, MD 02/24/23 [phone]

## 2023-02-24 NOTE — ED Notes (Signed)
ED TO INPATIENT HANDOFF REPORT  ED Nurse Name and Phone #: Beatris Ship RN [phone]  S Name/Age/Gender Todd [name] 62 y.o. male Room/Bed: 044C/044C  Code Status   Code Status: Full Code  Home/SNF/Other Home Patient oriented to: self, place, time, and situation Is this baseline? Yes   Triage Complete: Triage complete  Chief Complaint Tonsillar abscess [J36]  Triage Note Pt arrived from home via POV c/o sore swollen throat and now severe ear pain on right side. Pt was seen at [facility] on 02/22/23 for the sore throat. Pt states that he lost hearing in his left ear d/t a mumps infection at 62 yo and is now afraid that he will lose hearing in the right ear.    Allergies Allergies  Allergen Reactions   Wellbutrin [Bupropion Hcl] Itching and Rash    Level of Care/Admitting Diagnosis ED Disposition     ED Disposition  Admit   Condition  --   Comment  Hospital Area: [facility] [[id number]]  Level of Care: Med-Surg [16]  May admit patient to [facility] or [facility] if equivalent level of care is available:: No  Covid Evaluation: Asymptomatic - no recent exposure (last 10 days) testing not required  Diagnosis: Tonsillar abscess [960454]  Admitting Physician: Clydie Braun [[id number]]  Attending Physician: Clydie Braun [[id number]]  Certification:: I certify this patient will need inpatient services for at least 2 midnights  Expected Medical Readiness: 02/26/2023          B Medical/Surgery History Past Medical History:  Diagnosis Date   CAD (coronary artery disease)    Complicated migraine    Deafness in left ear    Diverticulosis    External hemorrhoids    Headache    Hypertension    Internal hemorrhoids    Palpitations    Stroke [facility])    TIA (transient ischemic attack)    Tubular adenoma of colon    Vision abnormalities    Past Surgical History:  Procedure Laterality Date   APPENDECTOMY  12/01/11   lap appy   CORONARY STENT INTERVENTION N/A  07/17/2021   Procedure: CORONARY STENT INTERVENTION;  Surgeon: Kathleene Hazel, MD;  Location: [facility] INVASIVE CV LAB;  Service: Cardiovascular;  Laterality: N/A;   LAPAROSCOPIC APPENDECTOMY  12/02/2011   Procedure: APPENDECTOMY LAPAROSCOPIC;  Surgeon: Lodema Pilot, DO;  Location: [facility] ORS;  Service: General;  Laterality: N/A;   LEFT HEART CATH AND CORONARY ANGIOGRAPHY N/A 07/17/2021   Procedure: LEFT HEART CATH AND CORONARY ANGIOGRAPHY;  Surgeon: Kathleene Hazel, MD;  Location: [facility] INVASIVE CV LAB;  Service: Cardiovascular;  Laterality: N/A;     A IV Location/Drains/Wounds Patient Lines/Drains/Airways Status     Active Line/Drains/Airways     Name Placement date Placement time Site Days   Peripheral IV 02/24/23 20 G Right Antecubital 02/24/23  0414  Antecubital  less than 1            Intake/Output Last 24 hours No intake or output data in the 24 hours ending 02/24/23 0826  Labs/Imaging Results for orders placed or performed during the hospital encounter of 02/23/23 (from the past 48 hour(s))  Comprehensive metabolic panel     Status: Abnormal   Collection Time: 02/24/23 12:28 AM  Result Value Ref Range   Sodium 137 135 - 145 mmol/L   Potassium 4.3 3.5 - 5.1 mmol/L   Chloride 105 98 - 111 mmol/L   CO2 24 22 - 32 mmol/L   Glucose, Bld 115 (  H) 70 - 99 mg/dL    Comment: Glucose reference range applies only to samples taken after fasting for at least 8 hours.   BUN 11 6 - 20 mg/dL   Creatinine, Ser 1.19 0.61 - 1.24 mg/dL   Calcium 9.2 8.9 - 14.7 mg/dL   Total Protein 6.7 6.5 - 8.1 g/dL   Albumin 3.6 3.5 - 5.0 g/dL   AST 26 15 - 41 U/L   ALT 31 0 - 44 U/L   Alkaline Phosphatase 57 38 - 126 U/L   Total Bilirubin 2.2 (H) <1.2 mg/dL   GFR, Estimated >82 >95 mL/min    Comment: (NOTE) Calculated using the CKD-EPI Creatinine Equation (2021)    Anion gap 8 5 - 15    Comment: Performed at [facility], [address] [place], [place] [address]  CBC with  Differential     Status: Abnormal   Collection Time: 02/24/23 12:28 AM  Result Value Ref Range   WBC 14.3 (H) 4.0 - 10.5 K/uL   RBC 5.14 4.22 - 5.81 MIL/uL   Hemoglobin 15.2 13.0 - 17.0 g/dL   HCT 86.5 78.4 - 69.6 %   MCV 86.6 80.0 - 100.0 fL   MCH 29.6 26.0 - 34.0 pg   MCHC 34.2 30.0 - 36.0 g/dL   RDW 29.5 28.4 - 13.2 %   Platelets 223 150 - 400 K/uL   nRBC 0.0 0.0 - 0.2 %   Neutrophils Relative % 80 %   Neutro Abs 11.6 (H) 1.7 - 7.7 K/uL   Lymphocytes Relative 10 %   Lymphs Abs 1.4 0.7 - 4.0 K/uL   Monocytes Relative 8 %   Monocytes Absolute 1.1 (H) 0.1 - 1.0 K/uL   Eosinophils Relative 1 %   Eosinophils Absolute 0.1 0.0 - 0.5 K/uL   Basophils Relative 0 %   Basophils Absolute 0.1 0.0 - 0.1 K/uL   Immature Granulocytes 1 %   Abs Immature Granulocytes 0.08 (H) 0.00 - 0.07 K/uL    Comment: Performed at [facility], [address] [place], [place] [address]  Group A Strep by PCR     Status: None   Collection Time: 02/24/23 12:33 AM   Specimen: Throat; Sterile Swab  Result Value Ref Range   Group A Strep by PCR NOT DETECTED NOT DETECTED    Comment: Performed at [facility], [address] [place], [place] [address]  Resp panel by RT-PCR (RSV, Flu A&B, Covid) Throat     Status: None   Collection Time: 02/24/23 12:33 AM   Specimen: Throat; Nasal Swab  Result Value Ref Range   SARS Coronavirus 2 by RT PCR NEGATIVE NEGATIVE   Influenza A by PCR NEGATIVE NEGATIVE   Influenza B by PCR NEGATIVE NEGATIVE    Comment: (NOTE) The Xpert Xpress SARS-CoV-2/FLU/RSV plus assay is intended as an aid in the diagnosis of influenza from Nasopharyngeal swab specimens and should not be used as a sole basis for treatment. Nasal washings and aspirates are unacceptable for Xpert Xpress SARS-CoV-2/FLU/RSV testing.  Fact Sheet for Patients: BloggerCourse.com  Fact Sheet for Healthcare Providers: SeriousBroker.it  This test is not  yet approved or cleared by the [place] FDA and has been authorized for detection and/or diagnosis of SARS-CoV-2 by FDA under an Emergency Use Authorization (EUA). This EUA will remain in effect (meaning this test can be used) for the duration of the COVID-19 declaration under Section 564(b)(1) of the Act, 21 U.S.C. section 360bbb-3(b)(1),  unless the authorization is terminated or revoked.     Resp Syncytial Virus by PCR NEGATIVE NEGATIVE    Comment: (NOTE) Fact Sheet for Patients: BloggerCourse.com  Fact Sheet for Healthcare Providers: SeriousBroker.it  This test is not yet approved or cleared by the [place] FDA and has been authorized for detection and/or diagnosis of SARS-CoV-2 by FDA under an Emergency Use Authorization (EUA). This EUA will remain in effect (meaning this test can be used) for the duration of the COVID-19 declaration under Section 564(b)(1) of the Act, 21 U.S.C. section 360bbb-3(b)(1), unless the authorization is terminated or revoked.  Performed at [facility], [address] [place], [place] [address]    CT Soft Tissue Neck W Contrast  Result Date: 02/24/2023 CLINICAL DATA:  62 year old male with throat swelling. Severe ear pain on the right. EXAM: CT NECK WITH CONTRAST TECHNIQUE: Multidetector CT imaging of the neck was performed using the standard protocol following the bolus administration of intravenous contrast. RADIATION DOSE REDUCTION: This exam was performed according to the departmental dose-optimization program which includes automated exposure control, adjustment of the mA and/or kV according to patient size and/or use of iterative reconstruction technique. CONTRAST:  75mL OMNIPAQUE IOHEXOL 350 MG/ML SOLN COMPARISON:  Neck CT 03/29/2022. FINDINGS: Pharynx and larynx: Multilocular right palatine tonsillar abscess (series 3, image 61) with rim enhancing complex fluid associated with  enlarged right palatine tonsil, edematous right lateral pharyngeal wall and soft palate, inflammation in the adjacent right parapharyngeal space. The abscess encompasses up to 2 cm collectively (sagittal image 39). And there are small superimposed dystrophic calcifications including in the more caudal loculated component. Associated swelling of the right hypopharynx and supraglottic larynx although the epiglottis remains normal. No airway narrowing. Adenoids and left tonsil/pharyngeal wall remain within normal limits. Only mild lingual tonsil hypertrophy. Mild retropharyngeal space edema. No other organized fluid collection or abscess. Salivary glands: Minimal secondary inflammation in the right submandibular space. The submandibular glands remain symmetric. Negative sublingual space. Negative parotid glands. Thyroid: Negative. Lymph nodes: Mild reactive appearing right level 2 lymph nodes are individually up to 9 mm short axis. Small but conspicuous and hyperenhancing retropharyngeal lymph nodes such as on series 3, image 48. Other cervical nodal stations are within normal limits. Vascular: Major vascular structures in the neck and at the skull base remain patent including the right carotid and right IJ. But note a partially retropharyngeal course of the proximal right ICA on series 3, image 68. Adjacent multi spatial inflammation. Mild for age atherosclerosis, limited to calcified plaque at the skull base. Limited intracranial: Negative. Visualized orbits: Negative. Mastoids and visualized paranasal sinuses: Mild paranasal sinus mucosal thickening. Middle ears and mastoids appear to remain well aerated. Skeleton: Stable visualized osseous structures. Chronic dental inflammatory changes, especially carious left maxillary anterior molar. Cervical spine degeneration. Upper chest: Mild respiratory motion, negative visible upper lungs, trachea and carina. Mild Calcified aortic atherosclerosis. Otherwise negative  visible superior mediastinum. IMPRESSION: 1. Positive for Multilocular Right Palatine Tonsil Abscess, collectively up to 2 cm diameter. Regional inflammation including retropharyngeal space edema. Edema affecting the right hypopharynx and supraglottic larynx but epiglottis remains normal. Mild reactive lymphadenopathy. 2. No other complicating features. But note partially retropharyngeal course of the Right ICA. 3. Carious dentition. Electronically Signed   By: Odessa Fleming M.D.   On: 02/24/2023 05:03    Pending Labs Unresulted Labs (From admission, onward)     Start     Ordered   02/25/23 0500  CBC  Tomorrow  morning,   R        02/24/23 0809   02/25/23 0500  Comprehensive metabolic panel  Tomorrow morning,   R        02/24/23 0820   02/24/23 0813  Culture, blood (single) w Reflex to ID Panel  Once,   R        02/24/23 0812   02/24/23 0809  Lactic acid, plasma  (Lactic Acid)  Add-on,   AD        02/24/23 0809   02/24/23 0807  HIV Antibody (routine testing w rflx)  (HIV Antibody (Routine testing w reflex) panel)  Once,   R        02/24/23 0809            Vitals/Pain Today's Vitals   02/24/23 0715 02/24/23 0730 02/24/23 0745 02/24/23 0800  BP: 119/77 124/77 125/74 124/74  Pulse: 87 92 84 86  Resp:  17    Temp:      TempSrc:      SpO2: 94% 96% 94% 94%  PainSc:        Isolation Precautions No active isolations  Medications Medications  enoxaparin (LOVENOX) injection 40 mg (has no administration in time range)  sodium chloride flush (NS) 0.9 % injection 3 mL (has no administration in time range)  acetaminophen (TYLENOL) tablet 650 mg (has no administration in time range)    Or  acetaminophen (TYLENOL) suppository 650 mg (has no administration in time range)  ondansetron (ZOFRAN) tablet 4 mg (has no administration in time range)    Or  ondansetron (ZOFRAN) injection 4 mg (has no administration in time range)  albuterol (PROVENTIL) (2.5 MG/3ML) 0.083% nebulizer solution 2.5 mg  (has no administration in time range)  phenol (CHLORASEPTIC) mouth spray 1 spray (has no administration in time range)  dexamethasone (DECADRON) injection 4 mg (has no administration in time range)  rosuvastatin (CRESTOR) tablet 20 mg (has no administration in time range)  metoprolol succinate (TOPROL-XL) 24 hr tablet 25 mg (has no administration in time range)  aspirin EC tablet 81 mg (has no administration in time range)  iohexol (OMNIPAQUE) 350 MG/ML injection 75 mL (75 mLs Intravenous Contrast Given 02/24/23 0435)  dexamethasone (DECADRON) injection 10 mg (10 mg Intravenous Given 02/24/23 0558)  clindamycin (CLEOCIN) IVPB 900 mg (0 mg Intravenous Stopped 02/24/23 0723)    Mobility walks     R Recommendations: See Admitting Provider Note  Report given to: 1O10

## 2023-02-24 NOTE — ED Provider Notes (Signed)
Signout from [facility], pending drainage, from Dr. Jearld Fenton, at bedside.  Has PTA.  Was given clindamycin, Decadron in ER.  Will need antibiotics, for discharge.  Physical Exam  BP 132/75   Pulse 89   Temp 98.3 F (36.8 C) (Oral)   Resp 16   SpO2 98%   Physical Exam Vitals and nursing note reviewed.  Constitutional:      General: He is not in acute distress.    Appearance: He is well-developed.  HENT:     Head: Normocephalic and atraumatic.     Mouth/Throat:     Tonsils: Tonsillar abscess present.  Eyes:     Conjunctiva/sclera: Conjunctivae normal.  Cardiovascular:     Rate and Rhythm: Normal rate and regular rhythm.     Heart sounds: No murmur heard. Pulmonary:     Effort: Pulmonary effort is normal. No respiratory distress.     Breath sounds: Normal breath sounds.  Abdominal:     Palpations: Abdomen is soft.     Tenderness: There is no abdominal tenderness.  Musculoskeletal:        General: No swelling.     Cervical back: Neck supple.  Skin:    General: Skin is warm and dry.     Capillary Refill: Capillary refill takes less than 2 seconds.  Neurological:     Mental Status: He is alert.  Psychiatric:        Mood and Affect: Mood normal.     Procedures  Procedures  ED Course / MDM    Medical Decision Making He had palpable for PTA, pending drainage by Dr. Jearld Fenton, I spoke with Dr. Jearld Fenton, patient is refusing drainage, is requesting IV antibiotics prior to drainage, to see if it resolves on its own.  Dr. Jearld Fenton is agreeable to this, and recommends admission for IV antibiotics.  Admitted to Dr. Katrinka Blazing, for further medical therapy with Unasyn.  Amount and/or Complexity of Data Reviewed Labs: ordered. Radiology: ordered.  Risk Prescription drug management. Decision regarding hospitalization.         Todd [name], [place] 02/24/23 0759    Todd Simmonds T, DO 02/25/23 [id number]

## 2023-02-24 NOTE — ED Triage Notes (Signed)
Pt arrived from home via POV c/o sore swollen throat and now severe ear pain on right side. Pt was seen at [facility] on 02/22/23 for the sore throat. Pt states that he lost hearing in his left ear d/t a mumps infection at 62 yo and is now afraid that he will lose hearing in the right ear.

## 2023-02-24 NOTE — Consult Note (Signed)
Reason for Consult: Peritonsillar abscess right Referring Physician: Dr. Vicente Masson Saffle is an 62 y.o. male.  HPI: History of soreness of his throat from either Saturday or Sunday.  He was seen Sunday and started on an antibiotic and he only took 2.  He has right-sided sore throat.  No airway problems.  He has difficulty swallowing.  Slight amount of trismus.  No previous tonsillitis or abscess problems.  He does feel better since he has received the intravenous antibiotics and steroids here in the emergency room  Past Medical History:  Diagnosis Date   CAD (coronary artery disease)    Complicated migraine    Deafness in left ear    Diverticulosis    External hemorrhoids    Headache    Hypertension    Internal hemorrhoids    Palpitations    Stroke [facility])    TIA (transient ischemic attack)    Tubular adenoma of colon    Vision abnormalities     Past Surgical History:  Procedure Laterality Date   APPENDECTOMY  12/01/11   lap appy   CORONARY STENT INTERVENTION N/A 07/17/2021   Procedure: CORONARY STENT INTERVENTION;  Surgeon: Kathleene Hazel, MD;  Location: [facility] INVASIVE CV LAB;  Service: Cardiovascular;  Laterality: N/A;   LAPAROSCOPIC APPENDECTOMY  12/02/2011   Procedure: APPENDECTOMY LAPAROSCOPIC;  Surgeon: Lodema Pilot, DO;  Location: [facility] ORS;  Service: General;  Laterality: N/A;   LEFT HEART CATH AND CORONARY ANGIOGRAPHY N/A 07/17/2021   Procedure: LEFT HEART CATH AND CORONARY ANGIOGRAPHY;  Surgeon: Kathleene Hazel, MD;  Location: [facility] INVASIVE CV LAB;  Service: Cardiovascular;  Laterality: N/A;    Family History  Problem Relation Age of Onset   Hypertension Mother    Hypertension Father    Anemia Neg Hx    Arrhythmia Neg Hx    Asthma Neg Hx    Clotting disorder Neg Hx    Fainting Neg Hx    Heart attack Neg Hx    Heart disease Neg Hx    Heart failure Neg Hx    Hyperlipidemia Neg Hx    Colon cancer Neg Hx    Esophageal cancer Neg Hx    Stomach cancer Neg  Hx    Rectal cancer Neg Hx     Social History:  reports that he has never smoked. He has never used smokeless tobacco. He reports that he does not drink alcohol and does not use drugs.  Allergies:  Allergies  Allergen Reactions   Wellbutrin [Bupropion Hcl] Itching and Rash    Medications: I have reviewed the patient's current medications.  Results for orders placed or performed during the hospital encounter of 02/23/23 (from the past 48 hour(s))  Comprehensive metabolic panel     Status: Abnormal   Collection Time: 02/24/23 12:28 AM  Result Value Ref Range   Sodium 137 135 - 145 mmol/L   Potassium 4.3 3.5 - 5.1 mmol/L   Chloride 105 98 - 111 mmol/L   CO2 24 22 - 32 mmol/L   Glucose, Bld 115 (H) 70 - 99 mg/dL    Comment: Glucose reference range applies only to samples taken after fasting for at least 8 hours.   BUN 11 6 - 20 mg/dL   Creatinine, Ser 1.19 0.61 - 1.24 mg/dL   Calcium 9.2 8.9 - 14.7 mg/dL   Total Protein 6.7 6.5 - 8.1 g/dL   Albumin 3.6 3.5 - 5.0 g/dL   AST 26 15 - 41 U/L   ALT  31 0 - 44 U/L   Alkaline Phosphatase 57 38 - 126 U/L   Total Bilirubin 2.2 (H) <1.2 mg/dL   GFR, Estimated >60 >45 mL/min    Comment: (NOTE) Calculated using the CKD-EPI Creatinine Equation (2021)    Anion gap 8 5 - 15    Comment: Performed at [facility], [address] [place], [place] [address]  CBC with Differential     Status: Abnormal   Collection Time: 02/24/23 12:28 AM  Result Value Ref Range   WBC 14.3 (H) 4.0 - 10.5 K/uL   RBC 5.14 4.22 - 5.81 MIL/uL   Hemoglobin 15.2 13.0 - 17.0 g/dL   HCT 19.1 47.8 - 29.5 %   MCV 86.6 80.0 - 100.0 fL   MCH 29.6 26.0 - 34.0 pg   MCHC 34.2 30.0 - 36.0 g/dL   RDW 62.1 30.8 - 65.7 %   Platelets 223 150 - 400 K/uL   nRBC 0.0 0.0 - 0.2 %   Neutrophils Relative % 80 %   Neutro Abs 11.6 (H) 1.7 - 7.7 K/uL   Lymphocytes Relative 10 %   Lymphs Abs 1.4 0.7 - 4.0 K/uL   Monocytes Relative 8 %   Monocytes Absolute 1.1 (H) 0.1 - 1.0  K/uL   Eosinophils Relative 1 %   Eosinophils Absolute 0.1 0.0 - 0.5 K/uL   Basophils Relative 0 %   Basophils Absolute 0.1 0.0 - 0.1 K/uL   Immature Granulocytes 1 %   Abs Immature Granulocytes 0.08 (H) 0.00 - 0.07 K/uL    Comment: Performed at [facility], [address], [place], [place] [address]  Group A Strep by PCR     Status: None   Collection Time: 02/24/23 12:33 AM   Specimen: Throat; Sterile Swab  Result Value Ref Range   Group A Strep by PCR NOT DETECTED NOT DETECTED    Comment: Performed at [facility], [address], [place], [place] [address]  Resp panel by RT-PCR (RSV, Flu A&B, Covid) Throat     Status: None   Collection Time: 02/24/23 12:33 AM   Specimen: Throat; Nasal Swab  Result Value Ref Range   SARS Coronavirus 2 by RT PCR NEGATIVE NEGATIVE   Influenza A by PCR NEGATIVE NEGATIVE   Influenza B by PCR NEGATIVE NEGATIVE    Comment: (NOTE) The Xpert Xpress SARS-CoV-2/FLU/RSV plus assay is intended as an aid in the diagnosis of influenza from Nasopharyngeal swab specimens and should not be used as a sole basis for treatment. Nasal washings and aspirates are unacceptable for Xpert Xpress SARS-CoV-2/FLU/RSV testing.  Fact Sheet for Patients: BloggerCourse.com  Fact Sheet for Healthcare Providers: SeriousBroker.it  This test is not yet approved or cleared by the [place] FDA and has been authorized for detection and/or diagnosis of SARS-CoV-2 by FDA under an Emergency Use Authorization (EUA). This EUA will remain in effect (meaning this test can be used) for the duration of the COVID-19 declaration under Section 564(b)(1) of the Act, 21 U.S.C. section 360bbb-3(b)(1), unless the authorization is terminated or revoked.     Resp Syncytial Virus by PCR NEGATIVE NEGATIVE    Comment: (NOTE) Fact Sheet for Patients: BloggerCourse.com  Fact Sheet for Healthcare  Providers: SeriousBroker.it  This test is not yet approved or cleared by the [place] FDA and has been authorized for detection and/or diagnosis of SARS-CoV-2 by FDA under an Emergency Use Authorization (EUA). This EUA will remain in effect (meaning this test can  be used) for the duration of the COVID-19 declaration under Section 564(b)(1) of the Act, 21 U.S.C. section 360bbb-3(b)(1), unless the authorization is terminated or revoked.  Performed at [facility], [address] [place], [place] [address]     ROS Blood pressure 119/77, pulse 87, temperature 98.3 F (36.8 C), temperature source Oral, resp. rate 16, SpO2 94%. Physical Exam Constitutional:      Appearance: He is well-developed.  HENT:     Right Ear: Ear canal normal.     Left Ear: Ear canal normal.     Mouth/Throat:     Comments: He has swelling of the right tonsil and peritonsillar region but it is not bulging significantly at the soft palate area.  He has slight amount of edema.  Tongue and floor mouth look normal.  No excessive trismus.  No airway problems.  Voice is normal. Neurological:     Mental Status: He is alert.       Assessment/Plan: Right peritonsillar abscess-this is bilobed and total size is 2 cm or so.  We discussed incision and drainage.  We discussed other option of medical therapy.  He prefers to be admitted and undergo medical therapy first before the incision and drainage procedure.  This will be arranged with Unasyn and couple more doses of steroids  Suzanna Obey 02/24/2023, 7:28 AM

## 2023-02-25 DIAGNOSIS — I10 Essential (primary) hypertension: Secondary | ICD-10-CM | POA: Diagnosis not present

## 2023-02-25 DIAGNOSIS — E782 Mixed hyperlipidemia: Secondary | ICD-10-CM | POA: Diagnosis not present

## 2023-02-25 DIAGNOSIS — J36 Peritonsillar abscess: Secondary | ICD-10-CM | POA: Diagnosis not present

## 2023-02-25 DIAGNOSIS — I25110 Atherosclerotic heart disease of native coronary artery with unstable angina pectoris: Secondary | ICD-10-CM | POA: Diagnosis not present

## 2023-02-25 LAB — COMPREHENSIVE METABOLIC PANEL
ALT: 22 U/L (ref 0–44)
AST: 17 U/L (ref 15–41)
Albumin: 3 g/dL — ABNORMAL LOW (ref 3.5–5.0)
Alkaline Phosphatase: 46 U/L (ref 38–126)
Anion gap: 8 (ref 5–15)
BUN: 15 mg/dL (ref 6–20)
CO2: 21 mmol/L — ABNORMAL LOW (ref 22–32)
Calcium: 8.9 mg/dL (ref 8.9–10.3)
Chloride: 107 mmol/L (ref 98–111)
Creatinine, Ser: 0.92 mg/dL (ref 0.61–1.24)
GFR, Estimated: >60 mL/min (ref >60)
Glucose, Bld: 148 mg/dL — ABNORMAL HIGH (ref 70–99)
Potassium: 4.5 mmol/L (ref 3.5–5.1)
Sodium: 136 mmol/L (ref 135–145)
Total Bilirubin: 1.5 mg/dL — ABNORMAL HIGH (ref <1.2)
Total Protein: 6 g/dL — ABNORMAL LOW (ref 6.5–8.1)

## 2023-02-25 LAB — CBC
HCT: 38.8 % — ABNORMAL LOW (ref 39.0–52.0)
Hemoglobin: 13.3 g/dL (ref 13.0–17.0)
MCH: 28.9 pg (ref 26.0–34.0)
MCHC: 34.3 g/dL (ref 30.0–36.0)
MCV: 84.3 fL (ref 80.0–100.0)
Platelets: 219 10*3/uL (ref 150–400)
RBC: 4.6 MIL/uL (ref 4.22–5.81)
RDW: 11.9 % (ref 11.5–15.5)
WBC: 17.7 10*3/uL — ABNORMAL HIGH (ref 4.0–10.5)
nRBC: 0 % (ref 0.0–0.2)

## 2023-02-25 MED ORDER — AMOXICILLIN-POT CLAVULANATE 875-125 MG PO TABS: 1.0000 | ORAL_TABLET | Freq: Two times a day (BID) | ORAL | 0 refills | Status: AC

## 2023-02-25 NOTE — Progress Notes (Addendum)
Patient ID: Todd [name], male   DOB: [birth year], 62 y.o.   MRN: [id number]  He feels substantially better. Able to swallow and very little pain.   No significant swelling of the tonsils or erythema. Opens mouth well  I think he can go home on augmentin. No I/D needed with this degree of improvement. He should be almost back to normal in 2-3 days and if not follow up. Call [phone] for follow up

## 2023-02-25 NOTE — Plan of Care (Signed)

## 2023-02-25 NOTE — Discharge Summary (Signed)
Physician Discharge Summary   Patient: Todd [name] MRN: [id number] DOB: [birth year]  Admit date:     02/23/2023  Discharge date: 02/25/23  Discharge Physician: Meredeth Ide   PCP: Shade Flood, MD   Recommendations at discharge:   Follow-up ENT as needed Will discharge on Augmentin 1 tablet p.o. twice daily for 7 more days  Discharge Diagnoses: Principal Problem:   Tonsillar abscess Active Problems:   Sepsis (HCC)   Essential hypertension   CAD (coronary artery disease)   Hyperlipidemia   Hyperbilirubinemia  Resolved Problems:   * No resolved hospital problems. *  Hospital Course: 62 y.o. male with medical history significant of hypertension, hyperlipidemia, CAD s/p stent, and prior stroke who presents with sore throat.  Initially noted swelling on the right side of his face.  CT scan of the soft tissues of the neck noted a multi locular right palatine tonsil abscess collectively up to 2 cm in diameter with retropharyngeal edema and multiple dental caries. Dr. Jearld Fenton of ENT had been consulted and evaluated the patient for I&D. Patient deferred I&D for trial of medical management with antibiotics first. Patient had been given Decadron 10 mg IV and clindamycin and 900 mg IV. ENT recommended Unasyn and Decadron   Assessment and Plan:  SIRS/sepsis due to right palatine tonsil abscess -CT imaging showed 2 cm right palatine tonsillar abscess -ENT was consulted, patient given option for incision and drainage versus antibiotics and steroids -Patient wanted to try antibiotics and steroids, started on Decadron and Unasyn -Significantly improved with antibiotics and steroids. -Swallowing okay, pain has significantly improved -ENT saw this morning and recommend to discharge home on Augmentin -Will follow-up with Dr. Jearld Fenton if needed -Will discharge on Augmentin 1 tablet p.o. twice daily for 7 days  Leukocytosis -WBC elevated at 17,000, up from 14,000 yesterday -Likely in setting of  steroids  Hypertension -Continue metoprolol  Hyperlipidemia -Continue Crestor  Hyperbilirubinemia -Bilirubin improved to 1.5         Consultants: ENT Procedures performed: None Disposition: Home Diet recommendation:  Discharge Diet Orders (From admission, onward)     Start     Ordered   02/25/23 0000  Diet - low sodium heart healthy        02/25/23 1036           Carb modified diet DISCHARGE MEDICATION: Allergies as of 02/25/2023       Reactions   Wellbutrin [bupropion Hcl] Itching, Rash        Medication List     STOP taking these medications    amoxicillin 400 MG/5ML suspension Commonly known as: AMOXIL       TAKE these medications    amoxicillin-clavulanate 875-125 MG tablet Commonly known as: AUGMENTIN Take 1 tablet by mouth 2 (two) times daily for 7 days.   aspirin EC 81 MG tablet Take 81 mg by mouth daily. Swallow whole.   fluocinolone 0.01 % external solution Commonly known as: SYNALAR Apply topically 2 (two) times daily. For 2 weeks max   lidocaine 2 % solution Commonly known as: XYLOCAINE Use as directed 15 mLs in the mouth or throat every 3 (three) hours as needed (throat pain. Gargle and spit as needed for pain).   metoprolol succinate 25 MG 24 hr tablet Commonly known as: Toprol XL Take 1 tablet (25 mg total) by mouth daily.   rosuvastatin 20 MG tablet Commonly known as: CRESTOR TAKE 1 TABLET BY MOUTH EVERY DAY        Follow-up Information  Suzanna Obey, MD Follow up.   Specialty: Otolaryngology Why: As needed, If symptoms worsen Contact information: [address] [place] [address] [phone]                Discharge Exam: There were no vitals filed for this visit. General-appears in no acute distress Heart-S1-S2, regular, no murmur auscultated Lungs-clear to auscultation bilaterally, no wheezing or crackles auscultated Abdomen-soft, nontender, no organomegaly Extremities-no edema in  the lower extremities Neuro-alert, oriented x3, no focal deficit noted  Condition at discharge: good  The results of significant diagnostics from this hospitalization (including imaging, microbiology, ancillary and laboratory) are listed below for reference.   Imaging Studies: CT Soft Tissue Neck W Contrast  Result Date: 02/24/2023 CLINICAL DATA:  62 year old male with throat swelling. Severe ear pain on the right. EXAM: CT NECK WITH CONTRAST TECHNIQUE: Multidetector CT imaging of the neck was performed using the standard protocol following the bolus administration of intravenous contrast. RADIATION DOSE REDUCTION: This exam was performed according to the departmental dose-optimization program which includes automated exposure control, adjustment of the mA and/or kV according to patient size and/or use of iterative reconstruction technique. CONTRAST:  75mL OMNIPAQUE IOHEXOL 350 MG/ML SOLN COMPARISON:  Neck CT 03/29/2022. FINDINGS: Pharynx and larynx: Multilocular right palatine tonsillar abscess (series 3, image 61) with rim enhancing complex fluid associated with enlarged right palatine tonsil, edematous right lateral pharyngeal wall and soft palate, inflammation in the adjacent right parapharyngeal space. The abscess encompasses up to 2 cm collectively (sagittal image 39). And there are small superimposed dystrophic calcifications including in the more caudal loculated component. Associated swelling of the right hypopharynx and supraglottic larynx although the epiglottis remains normal. No airway narrowing. Adenoids and left tonsil/pharyngeal wall remain within normal limits. Only mild lingual tonsil hypertrophy. Mild retropharyngeal space edema. No other organized fluid collection or abscess. Salivary glands: Minimal secondary inflammation in the right submandibular space. The submandibular glands remain symmetric. Negative sublingual space. Negative parotid glands. Thyroid: Negative. Lymph nodes: Mild  reactive appearing right level 2 lymph nodes are individually up to 9 mm short axis. Small but conspicuous and hyperenhancing retropharyngeal lymph nodes such as on series 3, image 48. Other cervical nodal stations are within normal limits. Vascular: Major vascular structures in the neck and at the skull base remain patent including the right carotid and right IJ. But note a partially retropharyngeal course of the proximal right ICA on series 3, image 68. Adjacent multi spatial inflammation. Mild for age atherosclerosis, limited to calcified plaque at the skull base. Limited intracranial: Negative. Visualized orbits: Negative. Mastoids and visualized paranasal sinuses: Mild paranasal sinus mucosal thickening. Middle ears and mastoids appear to remain well aerated. Skeleton: Stable visualized osseous structures. Chronic dental inflammatory changes, especially carious left maxillary anterior molar. Cervical spine degeneration. Upper chest: Mild respiratory motion, negative visible upper lungs, trachea and carina. Mild Calcified aortic atherosclerosis. Otherwise negative visible superior mediastinum. IMPRESSION: 1. Positive for Multilocular Right Palatine Tonsil Abscess, collectively up to 2 cm diameter. Regional inflammation including retropharyngeal space edema. Edema affecting the right hypopharynx and supraglottic larynx but epiglottis remains normal. Mild reactive lymphadenopathy. 2. No other complicating features. But note partially retropharyngeal course of the Right ICA. 3. Carious dentition. Electronically Signed   By: Odessa Fleming M.D.   On: 02/24/2023 05:03    Microbiology: Results for orders placed or performed during the hospital encounter of 02/23/23  Group A Strep by PCR     Status: None   Collection  Time: 02/24/23 12:33 AM   Specimen: Throat; Sterile Swab  Result Value Ref Range Status   Group A Strep by PCR NOT DETECTED NOT DETECTED Final    Comment: Performed at [facility], [address], [place], [place] [address]  Resp panel by RT-PCR (RSV, Flu A&B, Covid) Throat     Status: None   Collection Time: 02/24/23 12:33 AM   Specimen: Throat; Nasal Swab  Result Value Ref Range Status   SARS Coronavirus 2 by RT PCR NEGATIVE NEGATIVE Final   Influenza A by PCR NEGATIVE NEGATIVE Final   Influenza B by PCR NEGATIVE NEGATIVE Final    Comment: (NOTE) The Xpert Xpress SARS-CoV-2/FLU/RSV plus assay is intended as an aid in the diagnosis of influenza from Nasopharyngeal swab specimens and should not be used as a sole basis for treatment. Nasal washings and aspirates are unacceptable for Xpert Xpress SARS-CoV-2/FLU/RSV testing.  Fact Sheet for Patients: BloggerCourse.com  Fact Sheet for Healthcare Providers: SeriousBroker.it  This test is not yet approved or cleared by the [place] FDA and has been authorized for detection and/or diagnosis of SARS-CoV-2 by FDA under an Emergency Use Authorization (EUA). This EUA will remain in effect (meaning this test can be used) for the duration of the COVID-19 declaration under Section 564(b)(1) of the Act, 21 U.S.C. section 360bbb-3(b)(1), unless the authorization is terminated or revoked.     Resp Syncytial Virus by PCR NEGATIVE NEGATIVE Final    Comment: (NOTE) Fact Sheet for Patients: BloggerCourse.com  Fact Sheet for Healthcare Providers: SeriousBroker.it  This test is not yet approved or cleared by the [place] FDA and has been authorized for detection and/or diagnosis of SARS-CoV-2 by FDA under an Emergency Use Authorization (EUA). This EUA will remain in effect (meaning this test can be used) for the duration of the COVID-19 declaration under Section 564(b)(1) of the Act, 21 U.S.C. section 360bbb-3(b)(1), unless the authorization is terminated or revoked.  Performed at [facility], [address]  [place], [place] [address]   Culture, blood (single) w Reflex to ID Panel     Status: None (Preliminary result)   Collection Time: 02/24/23  9:40 AM   Specimen: BLOOD  Result Value Ref Range Status   Specimen Description BLOOD BLOOD LEFT ARM  Final   Special Requests   Final    BOTTLES DRAWN AEROBIC AND ANAEROBIC Blood Culture results may not be optimal due to an inadequate volume of blood received in culture bottles   Culture   Final    NO GROWTH < 24 HOURS Performed at [facility], [address], [place], [place] [address]    Report Status PENDING  Incomplete    Labs: CBC: Recent Labs  Lab 02/24/23 0028 02/25/23 0451  WBC 14.3* 17.7*  NEUTROABS 11.6*  --   HGB 15.2 13.3  HCT 44.5 38.8*  MCV 86.6 84.3  PLT 223 219   Basic Metabolic Panel: Recent Labs  Lab 02/24/23 0028 02/25/23 0451  NA 137 136  K 4.3 4.5  CL 105 107  CO2 24 21*  GLUCOSE 115* 148*  BUN 11 15  CREATININE 0.94 0.92  CALCIUM 9.2 8.9   Liver Function Tests: Recent Labs  Lab 02/24/23 0028 02/25/23 0451  AST 26 17  ALT 31 22  ALKPHOS 57 46  BILITOT 2.2* 1.5*  PROT 6.7 6.0*  ALBUMIN 3.6 3.0*   CBG: No results for input(s): "GLUCAP" in the last 168 hours.  Discharge  time spent: greater than 30 minutes.  Signed: Meredeth Ide, MD [facility] 02/25/2023

## 2023-02-26 ENCOUNTER — Telehealth: Payer: Self-pay

## 2023-02-26 NOTE — Transitions of Care (Post Inpatient/ED Visit) (Signed)
02/26/2023  Name: Todd [name] MRN: [id number] DOB: 1961/01/20  Today's TOC FU Call Status: Today's TOC FU Call Status:: Successful TOC FU Call Completed TOC FU Call Complete Date: 02/26/23 Patient's Name and Date of Birth confirmed.  Transition Care Management Follow-up Telephone Call Date of Discharge: 02/25/23 Discharge Facility: [facility]) Type of Discharge: Inpatient Admission Primary Inpatient Discharge Diagnosis:: "tonsillar abscess" How have you been since you were released from the hospital?: Better (Pt vocies that he "feels much better-can tell that abxs are working," denies any pain,rested well last night, eating good) Any questions or concerns?: No  Items Reviewed: Did you receive and understand the discharge instructions provided?: Yes Medications obtained,verified, and reconciled?: Yes (Medications Reviewed) Any new allergies since your discharge?: No Dietary orders reviewed?: Yes Type of Diet Ordered:: low salt/heart healthy Do you have support at home?: Yes Name of Support/Comfort Primary Source: friend  Medications Reviewed Today: Medications Reviewed Today     Reviewed by Charlyn Minerva, RN (Registered Nurse) on 02/26/23 at [phone]  Med List Status: <None>   Medication Order Taking? Sig Documenting Provider Last Dose Status Informant  amoxicillin-clavulanate (AUGMENTIN) 875-125 MG tablet [id number] Yes Take 1 tablet by mouth 2 (two) times daily for 7 days. Meredeth Ide, MD Taking Active   aspirin EC 81 MG tablet [id number] Yes Take 81 mg by mouth daily. Swallow whole. [provider] Taking Active Self           Med Note Darryl Nestle   Sat Jul 20, 2021  4:56 PM)    fluocinolone (SYNALAR) 0.01 % external solution [id number] No Apply topically 2 (two) times daily. For 2 weeks max  Patient not taking: Reported on 02/26/2023   Olene Floss, [place] Not Taking Active Self  lidocaine (XYLOCAINE) 2 % solution [id number] No  Use as directed 15 mLs in the mouth or throat every 3 (three) hours as needed (throat pain. Gargle and spit as needed for pain).  Patient not taking: Reported on 02/26/2023   Bing Neighbors, NP Not Taking Active Self  metoprolol succinate (TOPROL XL) 25 MG 24 hr tablet [id number] Yes Take 1 tablet (25 mg total) by mouth daily. Chrystie Nose, MD Taking Active Self  rosuvastatin (CRESTOR) 20 MG tablet [id number] Yes TAKE 1 TABLET BY MOUTH EVERY DAY Hilty, Lisette Abu, MD Taking Active Self            Home Care and Equipment/Supplies: Were Home Health Services Ordered?: No Any new equipment or medical supplies ordered?: No  Functional Questionnaire: Do you need assistance with bathing/showering or dressing?: No Do you need assistance with meal preparation?: No Do you need assistance with eating?: No Do you have difficulty maintaining continence: No Do you need assistance with getting out of bed/getting out of a chair/moving?: No Do you have difficulty managing or taking your medications?: No  Follow up appointments reviewed: PCP Follow-up appointment confirmed?: No (declined-states he just saw PCP-will call if he does not get better) Specialist Hospital Follow-up appointment confirmed?: No Reason Specialist Follow-Up Not Confirmed: Patient has Specialist Provider Number and will Call for Appointment (pt aware per d//c instructions to follow up with ENT-Dr. Jearld Fenton if sxs worsen-confirmed he has contact info) Do you need transportation to your follow-up appointment?: No Do you understand care options if your condition(s) worsen?: Yes-patient verbalized understanding  SDOH Interventions Today    Flowsheet Row Most Recent Value  SDOH Interventions   Food Insecurity Interventions Intervention Not Indicated  Housing Interventions Intervention Not Indicated  Transportation Interventions Intervention Not Indicated  Utilities Interventions Intervention Not Indicated      TOC  interventions discussed/reviewed: -Discussed/reviewed insurance/health plans benefits -Doctor visit discussed/reviewed -PCP -Doctor visits discussed/reviewed-Specialist -Provided Verbal Education: 30-day TOC program, nutrition, meds & their functions, symptom mgmt., fall/safety measures in the home, infection-s/s and worsening condition-when to seek medical attention   Antionette Fairy, RN,BSN,CCM RN Care Manager Transitions of Care  [facility]  Direct Phone: [phone] Toll Free: [phone] Fax: [phone]

## 2023-03-01 LAB — CULTURE, BLOOD (SINGLE): Culture: NO GROWTH

## 2023-03-03 ENCOUNTER — Telehealth (INDEPENDENT_AMBULATORY_CARE_PROVIDER_SITE_OTHER): Payer: Self-pay | Admitting: Otolaryngology

## 2023-03-03 NOTE — Telephone Encounter (Signed)
03/03/23 Confirmed location w/ patient at the time appt was scheduled. tlt

## 2023-03-04 ENCOUNTER — Institutional Professional Consult (permissible substitution) (INDEPENDENT_AMBULATORY_CARE_PROVIDER_SITE_OTHER): Payer: Commercial Managed Care - HMO

## 2023-03-09 ENCOUNTER — Institutional Professional Consult (permissible substitution) (INDEPENDENT_AMBULATORY_CARE_PROVIDER_SITE_OTHER): Payer: Commercial Managed Care - HMO

## 2023-03-09 NOTE — Progress Notes (Signed)
Cardiology Office Note:  .   Date:  03/16/2023  ID:  Billey Gosling Purdy, DOB [birth year], MRN [id number] PCP: Shade Flood, MD  [facility] Providers Cardiologist:  Dr. Rennis Golden  History of Present Illness: .   Todd [name] is a 62 y.o. male we are following for complaints of palpitations and hypertension.  He has a history of coronary artery disease status post stent, prior CVA, and hyperlipidemia.  The patient recently was discharged from the hospital on 02/25/2023 after being seen for tonsillar abscess, and hypertension, the patient was found to have a right palate teen tonsil abscess up to 2 cm in diameter with retropharyngeal edema and multiple dental caries.  He was seen by ENT and was started antibiotics and steroids.  He was to follow-up with Dr. Jearld Fenton, ENT as needed and was started on Augmentin for 7 days.  He was continued on metoprolol Crestor and is here for cardiology follow-up.  The patient did have a ZIO monitor placed due to patient's symptoms of tachycardia and palpitations.  This revealed 1 episode of SVT and the patient was symptomatic at that time.  There was no evidence of A-fib or other arrhythmias.  Average heart rate was 77 bpm.  He comes today with a new problem.  He states he cannot get deep breaths on occasion.  While he was driving on his way to [facility] he began to have trouble taking deep breaths and began to panic from this.  He stopped at [facility] ED where he was worked up and not found to have any cardiac or acute issues.  He did follow-up with his primary care who suggested that he may have some anxiety, but is also ordered a sleep study.  The patient states this has been going on for several days and occurs intermittently.  He denies associated chest pain or heart racing associated.  He has also been advised by his primary care to begin an exercise regimen.  ROS: As above otherwise negative  Studies Reviewed: Marland Kitchen     Patient had a min HR of  49 bpm, max HR of 171 bpm, and avg HR of 73 bpm. Predominant underlying rhythm was Sinus Rhythm. 1 run of Supraventricular Tachycardia occurred lasting 4 beats with a max rate of 167 bpm (avg 157 bpm). Supraventricular Tachycardia was detected within +/- 45 seconds of symptomatic patient event(s). Isolated SVEs were rare (<1.0%), SVE Couplets were rare (<1.0%), and SVE Triplets were rare (<1.0%). No Isolated VEs, VE Couplets, or VE Triplets were present.    Physical Exam:   VS:  BP 116/72 (BP Location: Left Arm, Patient Position: Sitting)   Pulse 64   Ht 5\' 7"  (1.702 m)   Wt 179 lb (81.2 kg)   SpO2 98%   BMI 28.04 kg/m    Wt Readings from Last 3 Encounters:  03/16/23 179 lb (81.2 kg)  03/14/23 180 lb (81.6 kg)  02/12/23 181 lb (82.1 kg)    GEN: Well nourished, well developed in no acute distress NECK: No JVD; No carotid bruits CARDIAC: RRR, no murmurs, rubs, gallops RESPIRATORY:  Clear to auscultation without rales, wheezing or rhonchi  ABDOMEN: Soft, non-tender, non-distended EXTREMITIES:  No edema; No deformity   ASSESSMENT AND PLAN: .    Palpitations: I have reviewed his ZIO monitor results with the patient.  Rare PSVT was noted.  No significant burden associated.  Less than 1%.  He had 4 beats of SVT only.  The patient states she still  continues to have these issues but they are not occurring that often anymore and the duration is only a few seconds.  Will not change his medication regimen.  2.  Coronary artery disease: History of prior stent.  The patient offers no complaints of chest pain.  He is on aspirin only.  He will continue metoprolol 25 mg daily, and lipid management with rosuvastatin.  3.  Hypercholesterolemia: Goal of LDL less than 70.  He continues on rosuvastatin 20 mg daily.  Labs are monitored by PCP.  Most recent labs drawn in April 2024, LDL 90, total cholesterol 119, HDL 47.3.  He is reminded of a low-cholesterol Mediterranean diet along with purposeful exercise.   If we are not at goal would consider adding Zetia 10 mg daily.  Follow-up labs are planned by PCP.  4.  Daytime somnolence: The patient feels tired during the day and is having issues with breathing.  Primary care has ordered a sleep study.  I agree that this would be prudent especially with his heavy snoring.  5.  Difficulty taking deep breaths: Occurs on occasion but has been going on for several weeks..  Nothing is triggering these events.  He does not feel anxious or worried when this occurs.  Afterwards he feels anxious wondering what may have caused this.  I will order some PFTs to be sure that there are no issues with his breathing status.  Today his oxygen level is absolutely normal.    6.  Overweight.  I agree that he should begin an exercise program for weight loss, increase stamina and energy, and overall physical and psychological health.  I explained to him that it does help with energy and anxiety to have a daily exercise regimen.  He has joined a gym and plans to begin working out as soon as possible.  He wanted this to be cleared with cardiology before proceeding.  I have encouraged him in this endeavor.      Signed, Bettey Mare. Liborio Nixon, ANP, AACC

## 2023-03-13 ENCOUNTER — Telehealth: Payer: Self-pay | Admitting: Family Medicine

## 2023-03-13 ENCOUNTER — Inpatient Hospital Stay: Payer: Commercial Managed Care - HMO | Admitting: Family Medicine

## 2023-03-13 NOTE — Telephone Encounter (Addendum)
Caller name: Kwami Winkelman  On DPR?: Yes  Call back number: [phone] (mobile)  Provider they see: Shade Flood, MD  Reason for call:  Pt was scheduled on 12/20 at 1pm for a HFU with Dr. Beverely Low. However, the appt notes stated the appt was for a 6 month F/U. Pt was contacted and I explained what happened, and apologized to him, and to correct the issue. Dr. Neva Seat has given the okay to schedule the HFU on 12/30 and he will also do the OV at that time.

## 2023-03-14 ENCOUNTER — Encounter (HOSPITAL_COMMUNITY): Payer: Self-pay

## 2023-03-14 ENCOUNTER — Ambulatory Visit (HOSPITAL_COMMUNITY)
Admission: EM | Admit: 2023-03-14 | Discharge: 2023-03-14 | Disposition: A | Discharge status: Home / Self Care | Payer: Commercial Managed Care - HMO | Attending: Emergency Medicine | Admitting: Emergency Medicine

## 2023-03-14 DIAGNOSIS — R140 Abdominal distension (gaseous): Secondary | ICD-10-CM | POA: Diagnosis present

## 2023-03-14 DIAGNOSIS — R6881 Early satiety: Secondary | ICD-10-CM

## 2023-03-14 DIAGNOSIS — K2970 Gastritis, unspecified, without bleeding: Secondary | ICD-10-CM

## 2023-03-14 LAB — CBC WITH DIFFERENTIAL/PLATELET
Abs Immature Granulocytes: 0.04 10*3/uL (ref 0.00–0.07)
Basophils Absolute: 0.1 10*3/uL (ref 0.0–0.1)
Basophils Relative: 1 %
Eosinophils Absolute: 0.1 10*3/uL (ref 0.0–0.5)
Eosinophils Relative: 1 %
HCT: 46.1 % (ref 39.0–52.0)
Hemoglobin: 15.7 g/dL (ref 13.0–17.0)
Immature Granulocytes: 1 %
Lymphocytes Relative: 24 %
Lymphs Abs: 1.6 10*3/uL (ref 0.7–4.0)
MCH: 29.2 pg (ref 26.0–34.0)
MCHC: 34.1 g/dL (ref 30.0–36.0)
MCV: 85.7 fL (ref 80.0–100.0)
Monocytes Absolute: 0.4 10*3/uL (ref 0.1–1.0)
Monocytes Relative: 7 %
Neutro Abs: 4.4 10*3/uL (ref 1.7–7.7)
Neutrophils Relative %: 66 %
Platelets: 229 10*3/uL (ref 150–400)
RBC: 5.38 MIL/uL (ref 4.22–5.81)
RDW: 12.1 % (ref 11.5–15.5)
WBC: 6.6 10*3/uL (ref 4.0–10.5)
nRBC: 0 % (ref 0.0–0.2)

## 2023-03-14 LAB — COMPREHENSIVE METABOLIC PANEL
ALT: 32 U/L (ref 0–44)
AST: 26 U/L (ref 15–41)
Albumin: 3.8 g/dL (ref 3.5–5.0)
Alkaline Phosphatase: 51 U/L (ref 38–126)
Anion gap: 8 (ref 5–15)
BUN: 15 mg/dL (ref 6–20)
CO2: 23 mmol/L (ref 22–32)
Calcium: 9.2 mg/dL (ref 8.9–10.3)
Chloride: 109 mmol/L (ref 98–111)
Creatinine, Ser: 1.06 mg/dL (ref 0.61–1.24)
GFR, Estimated: >60 mL/min (ref >60)
Glucose, Bld: 114 mg/dL — ABNORMAL HIGH (ref 70–99)
Potassium: 4.3 mmol/L (ref 3.5–5.1)
Sodium: 140 mmol/L (ref 135–145)
Total Bilirubin: 2.1 mg/dL — ABNORMAL HIGH (ref <1.2)
Total Protein: 6.8 g/dL (ref 6.5–8.1)

## 2023-03-14 LAB — LIPASE, BLOOD: Lipase: 28 U/L (ref 11–51)

## 2023-03-14 MED ORDER — OMEPRAZOLE 20 MG PO CPDR: 20.0000 mg | DELAYED_RELEASE_CAPSULE | Freq: Every day | ORAL | 0 refills | Status: DC

## 2023-03-14 MED ORDER — ALUMINUM-MAGNESIUM-SIMETHICONE 200-200-20 MG/5ML PO SUSP: 30.0000 mL | Freq: Three times a day (TID) | ORAL | 0 refills | Status: DC

## 2023-03-14 MED ORDER — ALUM & MAG HYDROXIDE-SIMETH 200-200-20 MG/5ML PO SUSP
ORAL | Status: AC
  Filled 2023-03-14: qty 30

## 2023-03-14 MED ORDER — ALUM & MAG HYDROXIDE-SIMETH 200-200-20 MG/5ML PO SUSP
30.0000 mL | Freq: Once | ORAL | Status: AC
  Administered 2023-03-14: 30 mL via ORAL

## 2023-03-14 NOTE — Discharge Instructions (Addendum)
I believe you are having inflammation and irritation of your stomach.  Take the Prilosec daily and take the Maalox as needed before meals and at bedtime.  Avoid spicy foods, fried foods and heavily processed foods as these can be more difficult to process and can increase your gastric acid secretions.  Eat smaller meals and stay upright afterwards.  Follow-up with your primary care provider as scheduled.  Do not hesitate to seek immediate care for any new or concerning symptoms at the nearest emergency department, as you may be a candidate for advanced imaging.

## 2023-03-14 NOTE — ED Provider Notes (Signed)
[facility]    CSN: [id number] Arrival date & time: 03/14/23  1118      History   Chief Complaint Chief Complaint  Patient presents with   Shortness of Breath   Bloated    HPI Todd [name] is a 62 y.o. male.    Patient reports ongoing abdominal bloating and dyspnea for the past week. Symptoms improve at night. Wakes up in the morning and symptoms are worse.  He has also been having early satiety, has been eating much less but still gets quite full.  This makes the bloating and shortness of breath worse.  Has been burping a lot and this makes him feel better.  Does not drink, no smoking.  BM every other day, this is normal, no bowel changes.  He has not had any chest pain.  Patient presented to a local emergency department on 12/18 with a complaints of dyspnea and shortness of breath.  At this time he had negative COVID, flu and RSV testing.  Negative dimer, negative troponin and normal chest x-ray.  Normal EKG. HEAR score of 3. Bilirubin was elevated at this visit.   On 12/2 he was admitted to the hospital for sepsis from a peritonsillar abscess.  At this time he was found to have hyperbilirubinemia.    The history is provided by the patient and medical records.  Shortness of Breath   Past Medical History:  Diagnosis Date   CAD (coronary artery disease)    Complicated migraine    Deafness in left ear    Diverticulosis    External hemorrhoids    Headache    Hypertension    Internal hemorrhoids    Palpitations    Stroke [facility])    TIA (transient ischemic attack)    Tubular adenoma of colon    Vision abnormalities     Patient Active Problem List   Diagnosis Date Noted   Tonsillar abscess 02/24/2023   Sepsis (HCC) 02/24/2023   Hyperbilirubinemia 02/24/2023   Essential hypertension 02/24/2023   Hearing loss of left ear 06/02/2022   Chronic anticoagulation 08/26/2021   Internal hemorrhoid, bleeding 08/26/2021   Prolapsed internal hemorrhoids,  grade 3 08/26/2021   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 coronary artery disease 08/26/2021   NSTEMI (non-ST elevated myocardial infarction) (HCC) 07/20/2021   CAD (coronary artery disease) 07/18/2021   Hyperlipidemia 07/18/2021   Unstable angina (HCC)    Non-recurrent acute serous otitis media of right ear 10/14/2018   Shortness of breath 11/12/2016   Chest pain 11/12/2016   Heart palpitations 10/20/2013   Situational anxiety 10/20/2013   Migraine with visual aura 08/24/2013   TIA (transient ischemic attack) 08/23/2013   Blurred vision 08/23/2013   Anomia 08/23/2013    Past Surgical History:  Procedure Laterality Date   APPENDECTOMY  12/01/11   lap appy   CORONARY STENT INTERVENTION N/A 07/17/2021   Procedure: CORONARY STENT INTERVENTION;  Surgeon: Kathleene Hazel, MD;  Location: [facility];  Service: Cardiovascular;  Laterality: N/A;   LAPAROSCOPIC APPENDECTOMY  12/02/2011   Procedure: APPENDECTOMY LAPAROSCOPIC;  Surgeon: Lodema Pilot, DO;  Location: [facility];  Service: General;  Laterality: N/A;   LEFT HEART CATH AND CORONARY ANGIOGRAPHY N/A 07/17/2021   Procedure: LEFT HEART CATH AND CORONARY ANGIOGRAPHY;  Surgeon: Kathleene Hazel, MD;  Location: [facility];  Service: Cardiovascular;  Laterality: N/A;       Home Medications    Prior to Admission  medications   Medication Sig Start Date End Date Taking? Authorizing Provider  aluminum-magnesium hydroxide-simethicone (MAALOX) 200-200-20 MG/5ML SUSP Take 30 mLs by mouth 4 (four) times daily -  before meals and at bedtime. 03/14/23  Yes Rinaldo Ratel, [place], FNP  omeprazole (PRILOSEC) 20 MG capsule Take 1 capsule (20 mg total) by mouth daily. 03/14/23  Yes Rinaldo Ratel, [place], FNP  aspirin EC 81 MG tablet Take 81 mg by mouth daily. Swallow whole.    [provider]  fluocinolone (SYNALAR) 0.01 % external solution Apply topically 2 (two) times daily. For 2  weeks max Patient not taking: Reported on 02/26/2023 02/09/23   Prosperi, Christian H, PA-C  lidocaine (XYLOCAINE) 2 % solution Use as directed 15 mLs in the mouth or throat every 3 (three) hours as needed (throat pain. Gargle and spit as needed for pain). Patient not taking: Reported on 02/26/2023 02/23/23   Bing Neighbors, NP  metoprolol succinate (TOPROL XL) 25 MG 24 hr tablet Take 1 tablet (25 mg total) by mouth daily. 07/14/22   Hilty, Lisette Abu, MD  rosuvastatin (CRESTOR) 20 MG tablet TAKE 1 TABLET BY MOUTH EVERY DAY 01/05/23   Hilty, Lisette Abu, MD    Family History Family History  Problem Relation Age of Onset   Hypertension Mother    Hypertension Father    Anemia Neg Hx    Arrhythmia Neg Hx    Asthma Neg Hx    Clotting disorder Neg Hx    Fainting Neg Hx    Heart attack Neg Hx    Heart disease Neg Hx    Heart failure Neg Hx    Hyperlipidemia Neg Hx    Colon cancer Neg Hx    Esophageal cancer Neg Hx    Stomach cancer Neg Hx    Rectal cancer Neg Hx     Social History Social History   Tobacco Use   Smoking status: Never   Smokeless tobacco: Never  Vaping Use   Vaping status: Never Used  Substance Use Topics   Alcohol use: No   Drug use: No     Allergies   Wellbutrin [bupropion hcl]   Review of Systems Review of Systems  Per HPI   Physical Exam Triage Vital Signs ED Triage Vitals  Encounter Vitals Group     BP 03/14/23 1200 132/69     Systolic BP Percentile --      Diastolic BP Percentile --      Pulse Rate 03/14/23 1200 75     Resp 03/14/23 1200 18     Temp 03/14/23 1200 97.9 F (36.6 C)     Temp Source 03/14/23 1200 Oral     SpO2 03/14/23 1200 100 %     Weight 03/14/23 1205 180 lb (81.6 kg)     Height 03/14/23 1205 5\' 7"  (1.702 m)     Head Circumference --      Peak Flow --      Pain Score 03/14/23 1205 0     Pain Loc --      Pain Education --      Exclude from Growth Chart --    No data found.  Updated Vital Signs BP 132/69 (BP  Location: Right Arm)   Pulse 75   Temp 97.9 F (36.6 C) (Oral)   Resp 18   Ht 5\' 7"  (1.702 m)   Wt 180 lb (81.6 kg)   SpO2 100%   BMI 28.19 kg/m   Visual Acuity Right Eye Distance:  Left Eye Distance:   Bilateral Distance:    Right Eye Near:   Left Eye Near:    Bilateral Near:     Physical Exam Vitals and nursing note reviewed.  Constitutional:      Appearance: Normal appearance. He is well-developed.  HENT:     Head: Normocephalic and atraumatic.     Right Ear: External ear normal.     Left Ear: External ear normal.     Mouth/Throat:     Mouth: Mucous membranes are moist.  Eyes:     Conjunctiva/sclera: Conjunctivae normal.  Cardiovascular:     Rate and Rhythm: Normal rate and regular rhythm.     Heart sounds: Normal heart sounds. No murmur heard. Pulmonary:     Effort: Pulmonary effort is normal. No respiratory distress.     Breath sounds: Normal breath sounds. No decreased breath sounds.  Abdominal:     General: Abdomen is flat. Bowel sounds are normal.     Palpations: Abdomen is soft.     Tenderness: There is abdominal tenderness in the right lower quadrant and left lower quadrant. There is no guarding or rebound.     Comments: Mild lower abdominal TTP, no rebound or guarding.   Musculoskeletal:        General: Normal range of motion.  Skin:    General: Skin is warm and dry.  Neurological:     General: No focal deficit present.     Mental Status: He is alert and oriented to person, place, and time.  Psychiatric:        Mood and Affect: Mood normal.        Behavior: Behavior normal. Behavior is cooperative.      UC Treatments / Results  Labs (all labs ordered are listed, but only abnormal results are displayed) Labs Reviewed  COMPREHENSIVE METABOLIC PANEL  LIPASE, BLOOD  CBC WITH DIFFERENTIAL/PLATELET    EKG   Radiology No results found.  Procedures Procedures (including critical care time)  Medications Ordered in UC Medications  alum &  mag hydroxide-simeth (MAALOX/MYLANTA) 200-200-20 MG/5ML suspension 30 mL (30 mLs Oral Given 03/14/23 1230)    Initial Impression / Assessment and Plan / UC Course  I have reviewed the triage vital signs and the nursing notes.  Pertinent labs & imaging results that were available during my care of the patient were reviewed by me and considered in my medical decision making (see chart for details).  Vitals and triage reviewed, patient is hemodynamically stable.  Abdomen is soft with active bowel sounds.  Mild right lower and left lower quadrant tenderness to palpation, no rebound or guarding.  Head extensive cardiac workup on 12/18 that was negative.  Lungs are vesicular, heart with regular rate and rhythm, low concern for acute cardiopulmonary abnormalities.  Symptoms of early satiety, burping and bloating are consistent with gastritis.  No real improvement with Maalox.  Will trial daily PPI.  Rechecking some basic labs and a lipase, did have hyperbilirubinemia at last 2 emergency department visits.  PCP follow-up scheduled at the end of this month.  If no improvement would consider EGD versus further advanced imaging.  Plan of care, follow-up care return precautions given, all questions at this time.    Final Clinical Impressions(s) / UC Diagnoses   Final diagnoses:  Abdominal bloating  Early satiety  Gastritis, presence of bleeding unspecified, unspecified chronicity, unspecified gastritis type     Discharge Instructions      I believe you are having inflammation and  irritation of your stomach.  Take the Prilosec daily and take the Maalox as needed before meals and at bedtime.  Avoid spicy foods, fried foods and heavily processed foods as these can be more difficult to process and can increase your gastric acid secretions.  Eat smaller meals and stay upright afterwards.  Follow-up with your primary care provider as scheduled.  Do not hesitate to seek immediate care for any new or concerning  symptoms at the nearest emergency department, as you may be a candidate for advanced imaging.     ED Prescriptions     Medication Sig Dispense Auth. Provider   omeprazole (PRILOSEC) 20 MG capsule Take 1 capsule (20 mg total) by mouth daily. 30 capsule Rinaldo Ratel, [place], [place]   aluminum-magnesium hydroxide-simethicone (MAALOX) 200-200-20 MG/5ML SUSP Take 30 mLs by mouth 4 (four) times daily -  before meals and at bedtime. 355 mL Dannya Pitkin, [place], [place]      PDMP not reviewed this encounter.   Haylea Schlichting, [place], [place] 03/14/23 1300

## 2023-03-14 NOTE — ED Triage Notes (Signed)
Pt presents with complaints of SOB x 1 week and stomach bloating. Pt states his SOB is "first thing in the morning and after I eat, quite frankly I am scared to eat." Pt currently denies pain and denies taking medications for symptoms reported. Pt reports he has been to his primary care and the emergency room, providers believed it was anxiety related however pt states "I do not have anxiety first thing in the morning, I would like another opinion."

## 2023-03-16 ENCOUNTER — Encounter: Payer: Self-pay | Admitting: Adult Health

## 2023-03-16 ENCOUNTER — Ambulatory Visit: Payer: Commercial Managed Care - HMO | Attending: Adult Health | Admitting: Adult Health

## 2023-03-16 VITALS — BP 116/72 | HR 64 | Ht 67.0 in | Wt 179.0 lb

## 2023-03-16 DIAGNOSIS — R0602 Shortness of breath: Secondary | ICD-10-CM

## 2023-03-16 DIAGNOSIS — E7800 Pure hypercholesterolemia, unspecified: Secondary | ICD-10-CM | POA: Diagnosis not present

## 2023-03-16 DIAGNOSIS — R0683 Snoring: Secondary | ICD-10-CM | POA: Diagnosis not present

## 2023-03-16 DIAGNOSIS — I2510 Atherosclerotic heart disease of native coronary artery without angina pectoris: Secondary | ICD-10-CM

## 2023-03-16 DIAGNOSIS — E663 Overweight: Secondary | ICD-10-CM

## 2023-03-16 DIAGNOSIS — R002 Palpitations: Secondary | ICD-10-CM | POA: Diagnosis not present

## 2023-03-16 NOTE — Patient Instructions (Signed)
Medication Instructions:  No Changes *If you need a refill on your cardiac medications before your next appointment, please call your pharmacy*   Lab Work: No Labs If you have labs (blood work) drawn today and your tests are completely normal, you will receive your results only by: MyChart Message (if you have MyChart) OR A paper copy in the mail If you have any lab test that is abnormal or we need to change your treatment, we will call you to review the results.   Testing/Procedures: Waylan Boga Your physician has recommended that you have a sleep study. This test records several body functions during sleep, including: brain activity, eye movement, oxygen and carbon dioxide blood levels, heart rate and rhythm, breathing rate and rhythm, the flow of air through your mouth and nose, snoring, body muscle movements, and chest and belly movement.    Follow-Up: At [facility], you and your health needs are our priority.  As part of our continuing mission to provide you with exceptional heart care, we have created designated Provider Care Teams.  These Care Teams include your primary Cardiologist (physician) and Advanced Practice Providers (APPs -  Physician Assistants and Nurse Practitioners) who all work together to provide you with the care you need, when you need it.  We recommend signing up for the patient portal called "MyChart".  Sign up information is provided on this After Visit Summary.  MyChart is used to connect with patients for Virtual Visits (Telemedicine).  Patients are able to view lab/test results, encounter notes, upcoming appointments, etc.  Non-urgent messages can be sent to your provider as well.   To learn more about what you can do with MyChart, go to [email or web address].    Your next appointment:   3 month(s)  Provider:   Chrystie Nose, MD

## 2023-03-19 ENCOUNTER — Ambulatory Visit: Payer: Commercial Managed Care - HMO | Admitting: Family Medicine

## 2023-03-20 ENCOUNTER — Telehealth: Payer: Self-pay

## 2023-03-20 NOTE — Telephone Encounter (Addendum)
Results reveiwed with patient at office visit 03/16/23----- Message from Joni Reining sent at 03/13/2023 11:31 AM EST ----- I have reviewed ZIO monitor.  The patient only had a very short run of a fast heart rate lasting 4 beats only.  Would not want to make any medication changes at this time.  There is not enough of a burden of rapid heart rhythm to add or adjust treatment.

## 2023-03-23 ENCOUNTER — Encounter: Payer: Self-pay | Admitting: Family Medicine

## 2023-03-23 ENCOUNTER — Ambulatory Visit (INDEPENDENT_AMBULATORY_CARE_PROVIDER_SITE_OTHER): Payer: Commercial Managed Care - HMO | Admitting: Family Medicine

## 2023-03-23 VITALS — BP 132/80 | HR 58 | Temp 98.0°F | Ht 67.0 in | Wt 180.6 lb

## 2023-03-23 DIAGNOSIS — R12 Heartburn: Secondary | ICD-10-CM | POA: Diagnosis not present

## 2023-03-23 DIAGNOSIS — Z8719 Personal history of other diseases of the digestive system: Secondary | ICD-10-CM

## 2023-03-23 DIAGNOSIS — R140 Abdominal distension (gaseous): Secondary | ICD-10-CM | POA: Diagnosis not present

## 2023-03-23 DIAGNOSIS — R17 Unspecified jaundice: Secondary | ICD-10-CM | POA: Diagnosis not present

## 2023-03-23 DIAGNOSIS — R069 Unspecified abnormalities of breathing: Secondary | ICD-10-CM

## 2023-03-23 MED ORDER — HYDROCORTISONE (PERIANAL) 2.5 % EX CREA: 1.0000 | TOPICAL_CREAM | Freq: Two times a day (BID) | CUTANEOUS | 0 refills | Status: DC

## 2023-03-23 NOTE — Patient Instructions (Signed)
Keep follow-up for lung function testing as planned from cardiology visit.  Should also be scheduling for sleep apnea testing, let me know if you do not hear from them.  I am glad to hear that your symptoms are improved, which would indicate that there may be some heartburn or upset stomach because of previous chest and breathing symptoms.  See info below on foods to avoid that can cause more bloating or gas, but continue omeprazole daily for now with Maalox with meals as needed.  Follow-up with gastroenterology if you continue to have a feeling of early fullness or persistent abdominal symptoms.  Depending on result of the pulmonary function test if you continue to have feeling of incomplete deep breath, could certainly follow back up with pulmonary.  We also could try a low-dose of a medication for anxiety in case that could be the cause but again lets check those other tests first.  I will repeat the bilirubin level as well as some other test to see if that may just be a normal variant for you.  Other liver tests looked okay and previous abdominal CT and ultrasound was reassuring.  Return to the clinic or go to the nearest emergency room if any of your symptoms worsen or new symptoms occur.  Thanks for coming in today and take care.  Low-FODMAP Eating Plan  FODMAP stands for fermentable oligosaccharides, disaccharides, monosaccharides, and polyols. These are sugars that are hard for some people to digest. A low-FODMAP eating plan may help some people who have irritable bowel syndrome (IBS) and certain other bowel (intestinal) diseases to manage their symptoms. This meal plan can be complicated to follow. Work with a diet and nutrition specialist (dietitian) to make a low-FODMAP eating plan that is right for you. A dietitian can help make sure that you get enough nutrition from this diet. What are tips for following this plan? Reading food labels Check labels for hidden FODMAPs such  as: High-fructose syrup. Honey. Agave. Natural fruit flavors. Onion or garlic powder. Choose low-FODMAP foods that contain 3-4 grams of fiber per serving. Check food labels for serving sizes. Eat only one serving at a time to make sure FODMAP levels stay low. Sho[employer] Shop with a list of foods that are recommended on this diet and make a meal plan. Meal planning Follow a low-FODMAP eating plan for up to 6 weeks, or as told by your health care provider or dietitian. To follow the eating plan: Eliminate high-FODMAP foods from your diet completely. Choose only low-FODMAP foods to eat. You will do this for 2-6 weeks. Gradually reintroduce high-FODMAP foods into your diet one at a time. Most people should wait a few days before introducing the next new high-FODMAP food into their meal plan. Your dietitian can recommend how quickly you may reintroduce foods. Keep a daily record of what and how much you eat and drink. Make note of any symptoms that you have after eating. Review your daily record with a dietitian regularly to identify which foods you can eat and which foods you should avoid. General tips Drink enough fluid each day to keep your urine pale yellow. Avoid processed foods. These often have added sugar and may be high in FODMAPs. Avoid most dairy products, whole grains, and sweeteners. Work with a dietitian to make sure you get enough fiber in your diet. Avoid high FODMAP foods at meals to manage symptoms. Recommended foods Fruits Bananas, oranges, tangerines, lemons, limes, blueberries, raspberries, strawberries, grapes, cantaloupe, honeydew melon, kiwi,  papaya, passion fruit, and pineapple. Limited amounts of dried cranberries, banana chips, and shredded coconut. Vegetables Eggplant, zucchini, cucumber, peppers, green beans, bean sprouts, lettuce, arugula, kale, Swiss chard, spinach, collard greens, bok choy, summer squash, potato, and tomato. Limited amounts of corn, carrot, and  sweet potato. Green parts of scallions. Grains Gluten-free grains, such as rice, oats, buckwheat, quinoa, corn, polenta, and millet. Gluten-free pasta, bread, or cereal. Rice noodles. Corn tortillas. Meats and other proteins Unseasoned beef, pork, poultry, or fish. Eggs. Tomasa Blase. Tofu (firm) and tempeh. Limited amounts of nuts and seeds, such as almonds, walnuts, [place] nuts, pecans, peanuts, nut butters, pumpkin seeds, chia seeds, and sunflower seeds. Dairy Lactose-free milk, yogurt, and kefir. Lactose-free cottage cheese and ice cream. Non-dairy milks, such as almond, coconut, hemp, and rice milk. Non-dairy yogurt. Limited amounts of goat cheese, brie, mozzarella, parmesan, swiss, and other hard cheeses. Fats and oils Butter-free spreads. Vegetable oils, such as olive, canola, and sunflower oil. Seasoning and other foods Artificial sweeteners with names that do not end in "ol," such as aspartame, saccharine, and stevia. Maple syrup, white table sugar, raw sugar, brown sugar, and molasses. Mayonnaise, soy sauce, and tamari. Fresh basil, coriander, parsley, rosemary, and thyme. Beverages Water and mineral water. Sugar-sweetened soft drinks. Small amounts of orange juice or cranberry juice. Black and green tea. Most dry wines. Coffee. The items listed above may not be a complete list of foods and beverages you can eat. Contact a dietitian for more information. Foods to avoid Fruits Fresh, dried, and juiced forms of apple, pear, watermelon, peach, plum, cherries, apricots, blackberries, boysenberries, figs, nectarines, and mango. Avocado. Vegetables Chicory root, artichoke, asparagus, cabbage, snow peas, Brussels sprouts, broccoli, sugar snap peas, mushrooms, celery, and cauliflower. Onions, garlic, leeks, and the white part of scallions. Grains Wheat, including kamut, durum, and semolina. Barley and bulgur. Couscous. Wheat-based cereals. Wheat noodles, bread, crackers, and pastries. Meats and other  proteins Fried or fatty meat. Sausage. Cashews and pistachios. Soybeans, baked beans, black beans, chickpeas, kidney beans, fava beans, navy beans, lentils, black-eyed peas, and split peas. Dairy Milk, yogurt, ice cream, and soft cheese. Cream and sour cream. Milk-based sauces. Custard. Buttermilk. Soy milk. Seasoning and other foods Any sugar-free gum or candy. Foods that contain artificial sweeteners such as sorbitol, mannitol, isomalt, or xylitol. Foods that contain honey, high-fructose corn syrup, or agave. Bouillon, vegetable stock, beef stock, and chicken stock. Garlic and onion powder. Condiments made with onion, such as hummus, chutney, pickles, relish, salad dressing, and salsa. Tomato paste. Beverages Chicory-based drinks. Coffee substitutes. Chamomile tea. Fennel tea. Sweet or fortified wines such as port or sherry. Diet soft drinks made with isomalt, mannitol, maltitol, sorbitol, or xylitol. Apple, pear, and mango juice. Juices with high-fructose corn syrup. The items listed above may not be a complete list of foods and beverages you should avoid. Contact a dietitian for more information. Summary FODMAP stands for fermentable oligosaccharides, disaccharides, monosaccharides, and polyols. These are sugars that are hard for some people to digest. A low-FODMAP eating plan is a short-term diet that helps to ease symptoms of certain bowel diseases. The eating plan usually lasts up to 6 weeks. After that, high-FODMAP foods are reintroduced gradually and one at a time. This can help you find out which foods may be causing symptoms. A low-FODMAP eating plan can be complicated. It is best to work with a dietitian who has experience with this type of plan. This information is not intended to replace advice given to you by your  health care provider. Make sure you discuss any questions you have with your health care provider. Document Revised: 07/28/2019 Document Reviewed: 07/28/2019 [employer] Patient  Education  2024 [employer].

## 2023-03-23 NOTE — Progress Notes (Signed)
Subjective:  Patient ID: Todd [name], male    DOB: 1960-04-15  Age: 62 y.o. MRN: [id number]  CC:  Chief Complaint  Patient presents with   Hospitalization Follow-up    Abdominal bloating and Dyspnea, notes it is better but not resolved, no concerns from patient    Medical Management of Chronic Issues    Pt is doing well no concerns at this time     HPI [facility] [place] presents for  ER/urgent care follow up.   12/21 urgent care eval on 12/21 - Abdominal bloating with dyspnea for a week. No fever, no vomiting. Some sensation of pressure in chest. Noted increased belching - worse in the morning - belching relieves pressure.  Normal BM - daily, no BRBPR, melana or change in stools.  Prior ER eval for dyspnea 12/18 that occurred when driving. Had been feeling fine, then acute dyspnea.did not have initial anxiety/panic symptoms/palpitations,  Negative D dimer, normal CXR and normal troponin, no concerns on EKG.  At urgent care was started on omeprazole daily - only using maalox once per day. Feels a lot better. Only notices mild symptoms with eating, not in the morning. No further CP.  Saw cardiology a few days ago. No concerns, plans on sleep testing. Told heart was ok.  Less belching.  Still some early satiety - past year.  No weight loss or night sweats.  Occasional upper abd pain.  Still has a feeling like cannot take a full/deep breath, but less than prior.  Bilirubin has been elevated on recent labs. 2.2, 1.5, 2.1 since 02/24/23 (peritonsilar abscess at that time). Bili 1.0 on 10/01/22.  1.2-1.9 form 07/2021 - 06/2022.  Has seen GI for abd bloating. Normal abdomen ultrasound 10/01/22. No acute abnormality on CT abd/pelvis in 10/01/22.  Plan for PFT's - discussed at cardiology appt 12/23.  Sleep study also pending.  Rare psvt on Zio monitor - less than 1%. No med change - continued on metoprolol.  No jaundice/scleral icterus.  Rare hemorrhoid flare - has used anusol with relief  in past - requests refill.    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   Chronic anticoagulation 08/26/2021   Internal hemorrhoid, bleeding 08/26/2021   Prolapsed internal hemorrhoids, grade 3 08/26/2021   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 coronary artery disease 08/26/2021   NSTEMI (non-ST elevated myocardial infarction) (HCC) 07/20/2021   CAD (coronary artery disease) 07/18/2021   Hyperlipidemia 07/18/2021   Unstable angina (HCC)    Non-recurrent acute serous otitis media of right ear 10/14/2018   Shortness of breath 11/12/2016   Chest pain 11/12/2016   Heart palpitations 10/20/2013   Situational anxiety 10/20/2013   Migraine with visual aura 08/24/2013   TIA (transient ischemic attack) 08/23/2013   Blurred vision 08/23/2013   Anomia 08/23/2013   Past Medical History:  Diagnosis Date   CAD (coronary artery disease)    Complicated migraine    Deafness in left ear    Diverticulosis    External hemorrhoids    Headache    Hypertension    Internal hemorrhoids    Palpitations    Stroke (HCC)    TIA (transient ischemic attack)    Tubular adenoma of colon    Vision abnormalities    Past Surgical History:  Procedure  Laterality Date   APPENDECTOMY  12/01/11   lap appy   CORONARY STENT INTERVENTION N/A 07/17/2021   Procedure: CORONARY STENT INTERVENTION;  Surgeon: Kathleene Hazel, MD;  Location: [facility];  Service: Cardiovascular;  Laterality: N/A;   LAPAROSCOPIC APPENDECTOMY  12/02/2011   Procedure: APPENDECTOMY LAPAROSCOPIC;  Surgeon: Lodema Pilot, DO;  Location: [facility];  Service: General;  Laterality: N/A;   LEFT HEART CATH AND CORONARY ANGIOGRAPHY N/A 07/17/2021   Procedure: LEFT HEART CATH AND CORONARY ANGIOGRAPHY;  Surgeon: Kathleene Hazel, MD;  Location: [facility];  Service: Cardiovascular;  Laterality: N/A;   Allergies  Allergen Reactions   Wellbutrin [Bupropion Hcl] Itching and Rash   Prior to Admission medications   Medication Sig Start Date End Date Taking? Authorizing Provider  aluminum-magnesium hydroxide-simethicone (MAALOX) 200-200-20 MG/5ML SUSP Take 30 mLs by mouth 4 (four) times daily -  before meals and at bedtime. 03/14/23  Yes Rinaldo Ratel, [place] N, FNP  aspirin EC 81 MG tablet Take 81 mg by mouth daily. Swallow whole.   Yes [provider]  metoprolol succinate (TOPROL XL) 25 MG 24 hr tablet Take 1 tablet (25 mg total) by mouth daily. 07/14/22  Yes Hilty, Lisette Abu, MD  omeprazole (PRILOSEC) 20 MG capsule Take 1 capsule (20 mg total) by mouth daily. 03/14/23  Yes Rinaldo Ratel, [place] N, FNP  rosuvastatin (CRESTOR) 20 MG tablet TAKE 1 TABLET BY MOUTH EVERY DAY 01/05/23  Yes Hilty, Lisette Abu, MD  fluocinolone (SYNALAR) 0.01 % external solution Apply topically 2 (two) times daily. For 2 weeks max Patient not taking: Reported on 02/26/2023 02/09/23   Prosperi, Christian H, PA-C  lidocaine (XYLOCAINE) 2 % solution Use as directed 15 mLs in the mouth or throat every 3 (three) hours as needed (throat pain. Gargle and spit as needed for pain). Patient not taking: Reported on 02/26/2023 02/23/23   Bing Neighbors, NP   Social History   Socioeconomic History   Marital status: Divorced    Spouse name: Not on file   Number of children: Not on file   Years of education: Not on file   Highest education level: Not on file  Occupational History   Occupation: part time  Tobacco Use   Smoking status: Never   Smokeless tobacco: Never  Vaping Use   Vaping status: Never Used  Substance and Sexual Activity   Alcohol use: No   Drug use: No   Sexual activity: Not Currently  Other Topics Concern   Not on file  Social History Narrative   Lives alone   Right Handed   Drinks very little caffeine    Social Drivers of Health   Financial Resource Strain: Not on file  Food Insecurity: No Food Insecurity (02/26/2023)   Hunger Vital Sign    Worried About Running Out of Food in the Last Year: Never true    Ran Out of Food in the Last Year: Never true  Transportation Needs: No Transportation Needs (02/26/2023)   PRAPARE - Administrator, Civil Service (Medical): No    Lack of Transportation (Non-Medical): No  Physical Activity: Not on file  Stress: Not on file  Social Connections: Not on file  Intimate Partner Violence: Not At Risk (03/11/2023)   Received [facility]   HITS    Over the last 12 months how often did your partner physically hurt you?: Never    Over the last 12 months how often did your partner insult  you or talk down to you?: Never    Over the last 12 months how often did your partner threaten you with physical harm?: Never    Over the last 12 months how often did your partner scream or curse at you?: Never    Review of Systems   Objective:   Vitals:   03/23/23 1117  BP: 132/80  Pulse: (!) 58  Temp: 98 F (36.7 C)  TempSrc: Temporal  SpO2: 98%  Weight: 180 lb 9.6 oz (81.9 kg)  Height: 5\' 7"  (1.702 m)     Physical Exam Vitals reviewed.  Constitutional:      General: He is not in acute distress.    Appearance: He is well-developed. He is not ill-appearing, toxic-appearing or diaphoretic.  HENT:     Head: Normocephalic and atraumatic.  Eyes:     General: No scleral icterus. Neck:     Vascular: No carotid bruit or JVD.  Cardiovascular:     Rate and Rhythm: Normal rate and regular rhythm.     Heart sounds: Normal heart sounds. No murmur heard. Pulmonary:     Effort: Pulmonary effort is normal.     Breath sounds: Normal breath sounds. No rales.  Abdominal:     General: There is no distension.     Tenderness: There is no abdominal tenderness. There is no guarding.  Musculoskeletal:     Right lower leg: No edema.     Left lower  leg: No edema.  Skin:    General: Skin is warm and dry.     Coloration: Skin is not jaundiced.  Neurological:     Mental Status: He is alert and oriented to person, place, and time.  Psychiatric:        Mood and Affect: Mood normal.     Assessment & Plan:  Lakhi Dils is a 62 y.o. male . Bloating Abnormality of breathing Heartburn  -Improved since ER and urgent care visit.  Improvement with PPI and Maalox, suggestive heartburn, reflux/GERD component.  Feeling of incomplete deep breath is also improved but some residual symptoms.  He has been evaluated by pulmonary previously, but has not yet had pulmonary function test.  Ordered by cardiology as above.  Reassuring exam.  Continue PPI, Maalox as needed, avoid high FODMAP foods or foods that may cause more bloating.  If he continues to have bloating sensation, reflux symptoms, early satiety, recommended he follow-up with gastroenterology.  Recheck in 1 month.  RTC/ER precautions given.  Elevated bilirubin  -Mild elevation then return to normal a past year, recent levels have been slightly higher.  Asymptomatic.  Other LFTs normal, most recent CBC is reassuring, prior liver ultrasound and CT abdomen pelvis without concerns.  Question if he may have Gilberts syndrome. repeat bilirubin, check reticulocyte count and pathologist smear.  History of hemorrhoids - Plan: hydrocortisone (ANUSOL-HC) 2.5 % rectal cream  -Intermittent symptoms, request refill of Anusol HC, refilled with RTC/GI follow-up precautions.  Meds ordered this encounter  Medications   hydrocortisone (ANUSOL-HC) 2.5 % rectal cream    Sig: Place 1 Application rectally 2 (two) times daily.    Dispense:  30 g    Refill:  0   Patient Instructions  Keep follow-up for lung function testing as planned from cardiology visit.  Should also be scheduling for sleep apnea testing, let me know if you do not hear from them.  I am glad to hear that your symptoms are improved, which  would indicate that there may  be some heartburn or upset stomach because of previous chest and breathing symptoms.  See info below on foods to avoid that can cause more bloating or gas, but continue omeprazole daily for now with Maalox with meals as needed.  Follow-up with gastroenterology if you continue to have a feeling of early fullness or persistent abdominal symptoms.  Depending on result of the pulmonary function test if you continue to have feeling of incomplete deep breath, could certainly follow back up with pulmonary.  We also could try a low-dose of a medication for anxiety in case that could be the cause but again lets check those other tests first.  I will repeat the bilirubin level as well as some other test to see if that may just be a normal variant for you.  Other liver tests looked okay and previous abdominal CT and ultrasound was reassuring.  Return to the clinic or go to the nearest emergency room if any of your symptoms worsen or new symptoms occur.  Thanks for coming in today and take care.  Low-FODMAP Eating Plan  FODMAP stands for fermentable oligosaccharides, disaccharides, monosaccharides, and polyols. These are sugars that are hard for some people to digest. A low-FODMAP eating plan may help some people who have irritable bowel syndrome (IBS) and certain other bowel (intestinal) diseases to manage their symptoms. This meal plan can be complicated to follow. Work with a diet and nutrition specialist (dietitian) to make a low-FODMAP eating plan that is right for you. A dietitian can help make sure that you get enough nutrition from this diet. What are tips for following this plan? Reading food labels Check labels for hidden FODMAPs such as: High-fructose syrup. Honey. Agave. Natural fruit flavors. Onion or garlic powder. Choose low-FODMAP foods that contain 3-4 grams of fiber per serving. Check food labels for serving sizes. Eat only one serving at a time to make sure  FODMAP levels stay low. Shopping Shop with a list of foods that are recommended on this diet and make a meal plan. Meal planning Follow a low-FODMAP eating plan for up to 6 weeks, or as told by your health care provider or dietitian. To follow the eating plan: Eliminate high-FODMAP foods from your diet completely. Choose only low-FODMAP foods to eat. You will do this for 2-6 weeks. Gradually reintroduce high-FODMAP foods into your diet one at a time. Most people should wait a few days before introducing the next new high-FODMAP food into their meal plan. Your dietitian can recommend how quickly you may reintroduce foods. Keep a daily record of what and how much you eat and drink. Make note of any symptoms that you have after eating. Review your daily record with a dietitian regularly to identify which foods you can eat and which foods you should avoid. General tips Drink enough fluid each day to keep your urine pale yellow. Avoid processed foods. These often have added sugar and may be high in FODMAPs. Avoid most dairy products, whole grains, and sweeteners. Work with a dietitian to make sure you get enough fiber in your diet. Avoid high FODMAP foods at meals to manage symptoms. Recommended foods Fruits Bananas, oranges, tangerines, lemons, limes, blueberries, raspberries, strawberries, grapes, cantaloupe, honeydew melon, kiwi, papaya, passion fruit, and pineapple. Limited amounts of dried cranberries, banana chips, and shredded coconut. Vegetables Eggplant, zucchini, cucumber, peppers, green beans, bean sprouts, lettuce, arugula, kale, Swiss chard, spinach, collard greens, bok choy, summer squash, potato, and tomato. Limited amounts of corn, carrot, and sweet potato.  Green parts of scallions. Grains Gluten-free grains, such as rice, oats, buckwheat, quinoa, corn, polenta, and millet. Gluten-free pasta, bread, or cereal. Rice noodles. Corn tortillas. Meats and other proteins Unseasoned beef,  pork, poultry, or fish. Eggs. Tomasa Blase. Tofu (firm) and tempeh. Limited amounts of nuts and seeds, such as almonds, walnuts, [place] nuts, pecans, peanuts, nut butters, pumpkin seeds, chia seeds, and sunflower seeds. Dairy Lactose-free milk, yogurt, and kefir. Lactose-free cottage cheese and ice cream. Non-dairy milks, such as almond, coconut, hemp, and rice milk. Non-dairy yogurt. Limited amounts of goat cheese, brie, mozzarella, parmesan, swiss, and other hard cheeses. Fats and oils Butter-free spreads. Vegetable oils, such as olive, canola, and sunflower oil. Seasoning and other foods Artificial sweeteners with names that do not end in "ol," such as aspartame, saccharine, and stevia. Maple syrup, white table sugar, raw sugar, brown sugar, and molasses. Mayonnaise, soy sauce, and tamari. Fresh basil, coriander, parsley, rosemary, and thyme. Beverages Water and mineral water. Sugar-sweetened soft drinks. Small amounts of orange juice or cranberry juice. Black and green tea. Most dry wines. Coffee. The items listed above may not be a complete list of foods and beverages you can eat. Contact a dietitian for more information. Foods to avoid Fruits Fresh, dried, and juiced forms of apple, pear, watermelon, peach, plum, cherries, apricots, blackberries, boysenberries, figs, nectarines, and mango. Avocado. Vegetables Chicory root, artichoke, asparagus, cabbage, snow peas, Brussels sprouts, broccoli, sugar snap peas, mushrooms, celery, and cauliflower. Onions, garlic, leeks, and the white part of scallions. Grains Wheat, including kamut, durum, and semolina. Barley and bulgur. Couscous. Wheat-based cereals. Wheat noodles, bread, crackers, and pastries. Meats and other proteins Fried or fatty meat. Sausage. Cashews and pistachios. Soybeans, baked beans, black beans, chickpeas, kidney beans, fava beans, navy beans, lentils, black-eyed peas, and split peas. Dairy Milk, yogurt, ice cream, and soft cheese.  Cream and sour cream. Milk-based sauces. Custard. Buttermilk. Soy milk. Seasoning and other foods Any sugar-free gum or candy. Foods that contain artificial sweeteners such as sorbitol, mannitol, isomalt, or xylitol. Foods that contain honey, high-fructose corn syrup, or agave. Bouillon, vegetable stock, beef stock, and chicken stock. Garlic and onion powder. Condiments made with onion, such as hummus, chutney, pickles, relish, salad dressing, and salsa. Tomato paste. Beverages Chicory-based drinks. Coffee substitutes. Chamomile tea. Fennel tea. Sweet or fortified wines such as port or sherry. Diet soft drinks made with isomalt, mannitol, maltitol, sorbitol, or xylitol. Apple, pear, and mango juice. Juices with high-fructose corn syrup. The items listed above may not be a complete list of foods and beverages you should avoid. Contact a dietitian for more information. Summary FODMAP stands for fermentable oligosaccharides, disaccharides, monosaccharides, and polyols. These are sugars that are hard for some people to digest. A low-FODMAP eating plan is a short-term diet that helps to ease symptoms of certain bowel diseases. The eating plan usually lasts up to 6 weeks. After that, high-FODMAP foods are reintroduced gradually and one at a time. This can help you find out which foods may be causing symptoms. A low-FODMAP eating plan can be complicated. It is best to work with a dietitian who has experience with this type of plan. This information is not intended to replace advice given to you by your health care provider. Make sure you discuss any questions you have with your health care provider. Document Revised: 07/28/2019 Document Reviewed: 07/28/2019 Elsevier Patient Education  2024 [employer].      Signed,   Meredith Staggers, MD [facility], [facility] 03/23/23 12:44 PM

## 2023-03-24 LAB — BILIRUBIN, FRACTIONATED(TOT/DIR/INDIR)
Bilirubin, Direct: 0.2 mg/dL (ref 0.0–0.2)
Indirect Bilirubin: 1.3 mg/dL — ABNORMAL HIGH (ref 0.2–1.2)
Total Bilirubin: 1.5 mg/dL — ABNORMAL HIGH (ref 0.2–1.2)

## 2023-03-24 LAB — PATHOLOGIST SMEAR REVIEW

## 2023-04-06 ENCOUNTER — Telehealth: Payer: Self-pay | Admitting: Internal Medicine

## 2023-04-06 NOTE — Telephone Encounter (Signed)
 Called and spoke to patient. Verified name and DOB. Patient states he was supposed to have PFT that was ordered at last visit. There was no test ordered. There was an old order from 05/22/22. Please advise if patient still need PFT.

## 2023-04-06 NOTE — Telephone Encounter (Signed)
 Spoke with the patient and see that PFT was ordered 05/22/22 by provider at [facility]. He reports that he went out of the [place] and could not schedule. Informed him that the order is good for one year and does not need a new order, but he should contact the office that ordered it- he has the number. I also gave him the number to call to schedule PFT= [phone]. He verbalized understanding of all information.

## 2023-04-06 NOTE — Telephone Encounter (Signed)
 Patient calling about test he suppose to have done. Please advise

## 2023-04-17 ENCOUNTER — Ambulatory Visit (HOSPITAL_BASED_OUTPATIENT_CLINIC_OR_DEPARTMENT_OTHER): Payer: No Typology Code available for payment source | Admitting: Pulmonary Disease

## 2023-04-17 DIAGNOSIS — R0609 Other forms of dyspnea: Secondary | ICD-10-CM

## 2023-04-17 LAB — PULMONARY FUNCTION TEST
DL/VA % pred: 106 %
DL/VA: 4.57 ml/min/mmHg/L
DLCO cor % pred: 96 %
DLCO cor: 23.54 ml/min/mmHg
DLCO unc % pred: 99 %
DLCO unc: 24.23 ml/min/mmHg
FEF 25-75 Post: 0.7 L/s
FEF 25-75 Pre: 1.25 L/s
FEF2575-%Change-Post: -44 %
FEF2575-%Pred-Post: 26 %
FEF2575-%Pred-Pre: 47 %
FEV1-%Change-Post: -17 %
FEV1-%Pred-Post: 42 %
FEV1-%Pred-Pre: 51 %
FEV1-Post: 1.33 L
FEV1-Pre: 1.62 L
FEV1FVC-%Change-Post: -16 %
FEV1FVC-%Pred-Pre: 65 %
FEV6-%Change-Post: -3 %
FEV6-%Pred-Post: 80 %
FEV6-%Pred-Pre: 83 %
FEV6-Post: 3.15 L
FEV6-Pre: 3.25 L
FEV6FVC-%Change-Post: -1 %
FEV6FVC-%Pred-Post: 103 %
FEV6FVC-%Pred-Pre: 105 %
FVC-%Change-Post: 0 %
FVC-%Pred-Post: 78 %
FVC-%Pred-Pre: 78 %
FVC-Post: 3.23 L
FVC-Pre: 3.25 L
Post FEV1/FVC ratio: 41 %
Post FEV6/FVC ratio: 98 %
Pre FEV1/FVC ratio: 50 %
Pre FEV6/FVC Ratio: 100 %
RV % pred: 106 %
RV: 2.15 L
TLC % pred: 93 %
TLC: 5.78 L

## 2023-04-17 NOTE — Patient Instructions (Signed)
Full PFT Performed Today

## 2023-04-17 NOTE — Progress Notes (Signed)
Full PFT Performed Today

## 2023-04-23 ENCOUNTER — Telehealth: Payer: Self-pay | Admitting: Pulmonary Disease

## 2023-04-23 NOTE — Telephone Encounter (Signed)
Patient would like to know the results of his PFT. He can be reached at [phone]. He will be out of the country from February 1-12.

## 2023-05-01 ENCOUNTER — Other Ambulatory Visit: Payer: Self-pay | Admitting: Pulmonary Disease

## 2023-05-01 ENCOUNTER — Encounter: Payer: Self-pay | Admitting: Pulmonary Disease

## 2023-05-21 ENCOUNTER — Ambulatory Visit: Payer: No Typology Code available for payment source | Admitting: Family Medicine

## 2023-05-21 VITALS — BP 114/60 | HR 67 | Temp 97.9°F | Ht 66.0 in | Wt 181.0 lb

## 2023-05-21 DIAGNOSIS — R140 Abdominal distension (gaseous): Secondary | ICD-10-CM | POA: Diagnosis not present

## 2023-05-21 DIAGNOSIS — R069 Unspecified abnormalities of breathing: Secondary | ICD-10-CM

## 2023-05-21 DIAGNOSIS — R942 Abnormal results of pulmonary function studies: Secondary | ICD-10-CM

## 2023-05-21 DIAGNOSIS — R0600 Dyspnea, unspecified: Secondary | ICD-10-CM

## 2023-05-21 LAB — CBC
HCT: 45.9 % (ref 39.0–52.0)
Hemoglobin: 15.5 g/dL (ref 13.0–17.0)
MCHC: 33.8 g/dL (ref 30.0–36.0)
MCV: 89 fL (ref 78.0–100.0)
Platelets: 230 10*3/uL (ref 150.0–400.0)
RBC: 5.16 Mil/uL (ref 4.22–5.81)
RDW: 13.6 % (ref 11.5–15.5)
WBC: 7.4 10*3/uL (ref 4.0–10.5)

## 2023-05-21 MED ORDER — OMEPRAZOLE 20 MG PO CPDR: 20.0000 mg | DELAYED_RELEASE_CAPSULE | Freq: Every day | ORAL | 2 refills | Status: DC

## 2023-05-21 NOTE — Patient Instructions (Addendum)
 I will check with your pulmonologist about follow-up of the recent abnormal lung function testing.  Some repeat labs today although I expect those to be okay and I did put in for a chest x-ray at the [facility] location below.  If any concerns on those results I will let you know.  For now no new pulmonary medicines/lung medicines but I do want you to restart the omeprazole as some of the bloating and sensation of fullness may be from heartburn.  Avoid carbonated beverages for now as well.  Return to the clinic or go to the nearest emergency room if any of your symptoms worsen or new symptoms occur.  [facility] or xray: Walk in 8:30-4:30 during weekdays, no appointment needed [address]  [place], [place] [address]

## 2023-05-21 NOTE — Progress Notes (Unsigned)
 Subjective:  Patient ID: Todd [name], male    DOB: 09-19-1960  Age: 63 y.o. MRN: [id number]  CC:  Chief Complaint  Patient presents with   Breathing Problem    Pt is still having difficulty with breathing and would like to be assessed again     HPI [facility] presents for   Dyspnea This has been a chronic concern, evaluated by pulmonary and cardiology.  When last discussed in December he still felt like he could not take a full deep breath but less symptomatic than prior.  Plan for PFTs, discussed that his cardiology appointment December 23.  Rare PSVT on ZIO monitor, less than 1%, no med changes and was continued on metoprolol.  Sleep study was also pending. He had been evaluated urgent care in December with bloating and dyspnea.  Negative D-dimer, normal chest x-ray, normal troponin and no concerns on EKG.  Started on omeprazole and did feel some improvement.  PFTs completed January 24.  Notes from pulmonologist, Dr. Clyde Lundborg on February 7 indicated the breathing study was significant for severe obstruction, narrowing in the airway that did not improve with albuterol. Patient has not received results.  Still feeling short of breath.  Comes and goes. Same sx's in past.  7-8 episodes since December. Some return of symptoms last night and this morning.  Started after drinking diet pepsi.  Trouble getting deep breath and feeling like 2 full. Working out 5 days per week. Tried to run 2 days ago - felt dyspneic after 2 mintues. Walking ok.  No chest pain.  Rare hookah. Nonsmoker otherwise, no second hand smoke.  Occasional heartburn. Out of PPI. Uses apple  cider vinegar. Prior ppi helped.  No chemical or dusty work exposure. 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  No hx of tuberculosis or inhalational injury.    Lab Results  Component Value Date   WBC 6.6 03/14/2023   HGB 15.7 03/14/2023   HCT 46.1  03/14/2023   MCV 85.7 03/14/2023   PLT 229 03/14/2023   Lab Results  Component Value Date   TSH 1.760 10/23/2022      History   Patient Active Problem List   Diagnosis Date Noted   Tonsillar abscess 02/24/2023   Sepsis (HCC) 02/24/2023   Hyperbilirubinemia 02/24/2023   Essential hypertension 02/24/2023   Hearing loss of left ear 06/02/2022   Chronic anticoagulation 08/26/2021   Internal hemorrhoid, bleeding 08/26/2021   Prolapsed internal hemorrhoids, grade 3 08/26/2021   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 coronary artery disease 08/26/2021   NSTEMI (non-ST elevated myocardial infarction) (HCC) 07/20/2021   CAD (coronary artery disease) 07/18/2021   Hyperlipidemia 07/18/2021   Unstable angina (HCC)    Non-recurrent acute serous otitis media of right ear 10/14/2018   Shortness of breath 11/12/2016   Chest pain 11/12/2016   Heart palpitations 10/20/2013   Situational anxiety 10/20/2013   Migraine with visual aura 08/24/2013   TIA (transient ischemic attack) 08/23/2013   Blurred vision 08/23/2013   Anomia 08/23/2013   Past Medical History:  Diagnosis Date   CAD (coronary artery disease)    Complicated migraine    Deafness in left ear    Diverticulosis    External hemorrhoids    Headache    Hypertension    Internal hemorrhoids    Palpitations    Stroke (HCC)    TIA (transient ischemic attack)  Tubular adenoma of colon    Vision abnormalities    Past Surgical History:  Procedure Laterality Date   APPENDECTOMY  12/01/11   lap appy   CORONARY STENT INTERVENTION N/A 07/17/2021   Procedure: CORONARY STENT INTERVENTION;  Surgeon: Kathleene Hazel, MD;  [facility];  Service: Cardiovascular;  Laterality: N/A;   LAPAROSCOPIC APPENDECTOMY  12/02/2011   Procedure: APPENDECTOMY LAPAROSCOPIC;  Surgeon: Lodema Pilot, DO;  Location: [facility];  Service: General;  Laterality: N/A;   LEFT HEART CATH AND CORONARY  ANGIOGRAPHY N/A 07/17/2021   Procedure: LEFT HEART CATH AND CORONARY ANGIOGRAPHY;  Surgeon: Kathleene Hazel, MD;  [facility]: [facility];  Service: Cardiovascular;  Laterality: N/A;   Allergies  Allergen Reactions   Wellbutrin [Bupropion Hcl] Itching and Rash   Prior to Admission medications   Medication Sig Start Date End Date Taking? Authorizing Provider  aluminum-magnesium hydroxide-simethicone (MAALOX) 200-200-20 MG/5ML SUSP Take 30 mLs by mouth 4 (four) times daily -  before meals and at bedtime. 03/14/23  Yes Rinaldo Ratel, [place] N, FNP  aspirin EC 81 MG tablet Take 81 mg by mouth daily. Swallow whole.   Yes [provider]  hydrocortisone (ANUSOL-HC) 2.5 % rectal cream Place 1 Application rectally 2 (two) times daily. 03/23/23  Yes Shade Flood, MD  metoprolol succinate (TOPROL XL) 25 MG 24 hr tablet Take 1 tablet (25 mg total) by mouth daily. 07/14/22  Yes Hilty, Lisette Abu, MD  omeprazole (PRILOSEC) 20 MG capsule Take 1 capsule (20 mg total) by mouth daily. 03/14/23  Yes Rinaldo Ratel, [place] N, FNP  rosuvastatin (CRESTOR) 20 MG tablet TAKE 1 TABLET BY MOUTH EVERY DAY 01/05/23  Yes Hilty, Lisette Abu, MD  fluocinolone (SYNALAR) 0.01 % external solution Apply topically 2 (two) times daily. For 2 weeks max Patient not taking: Reported on 02/26/2023 02/09/23   Prosperi, Christian H, PA-C  lidocaine (XYLOCAINE) 2 % solution Use as directed 15 mLs in the mouth or throat every 3 (three) hours as needed (throat pain. Gargle and spit as needed for pain). Patient not taking: Reported on 02/26/2023 02/23/23   Bing Neighbors, NP   Social History   Socioeconomic History   Marital status: Divorced    Spouse name: Not on file   Number of children: Not on file   Years of education: Not on file   Highest education level: Not on file  Occupational History   Occupation: part time  Tobacco Use   Smoking status: Never   Smokeless tobacco: Never  Vaping Use   Vaping status:  Never Used  Substance and Sexual Activity   Alcohol use: No   Drug use: No   Sexual activity: Not Currently  Other Topics Concern   Not on file  Social History Narrative   Lives alone   Right Handed   Drinks very little caffeine   Social Drivers of Health   Financial Resource Strain: Not on file  Food Insecurity: No Food Insecurity (02/26/2023)   Hunger Vital Sign    Worried About Running Out of Food in the Last Year: Never true    Ran Out of Food in the Last Year: Never true  Transportation Needs: No Transportation Needs (02/26/2023)   PRAPARE - Administrator, Civil Service (Medical): No    Lack of Transportation (Non-Medical): No  Physical Activity: Not on file  Stress: Not on file  Social Connections: Not on file  Intimate Partner Violence: Not At Risk (03/11/2023)  Received from [facility]   HITS    Over the last 12 months how often did your partner physically hurt you?: Never    Over the last 12 months how often did your partner insult you or talk down to you?: Never    Over the last 12 months how often did your partner threaten you with physical harm?: Never    Over the last 12 months how often did your partner scream or curse at you?: Never    Review of Systems   Objective:   Vitals:   05/21/23 1327  BP: 114/60  Pulse: 67  Temp: 97.9 F (36.6 C)  TempSrc: Temporal  SpO2: 98%  Weight: 181 lb (82.1 kg)  Height: 5\' 6"  (1.676 m)     Physical Exam Vitals reviewed.  Constitutional:      General: He is not in acute distress.    Appearance: Normal appearance. He is well-developed. He is not ill-appearing, toxic-appearing or diaphoretic.  HENT:     Head: Normocephalic and atraumatic.  Neck:     Vascular: No carotid bruit or JVD.  Cardiovascular:     Rate and Rhythm: Normal rate and regular rhythm.     Heart sounds: Normal heart sounds. No murmur heard. Pulmonary:     Effort: Pulmonary effort is normal. No respiratory distress.     Breath  sounds: Normal breath sounds. No stridor. No wheezing, rhonchi or rales.  Musculoskeletal:     Right lower leg: No edema.     Left lower leg: No edema.  Skin:    General: Skin is warm and dry.  Neurological:     Mental Status: He is alert and oriented to person, place, and time.  Psychiatric:        Mood and Affect: Mood normal.        Assessment & Plan:  Todd [name] is a 62 y.o. male . No diagnosis found.   No orders of the defined types were placed in this encounter.  There are no Patient Instructions on file for this visit.    Signed,   Meredith Staggers, MD [facility], [facility] 05/21/23 1:40 PM

## 2023-05-22 ENCOUNTER — Telehealth: Payer: Self-pay | Admitting: Internal Medicine

## 2023-05-22 ENCOUNTER — Encounter: Payer: Self-pay | Admitting: Family Medicine

## 2023-05-22 ENCOUNTER — Telehealth: Payer: Self-pay | Admitting: Pulmonary Disease

## 2023-05-22 ENCOUNTER — Ambulatory Visit: Payer: Self-pay | Admitting: Family Medicine

## 2023-05-22 ENCOUNTER — Ambulatory Visit (INDEPENDENT_AMBULATORY_CARE_PROVIDER_SITE_OTHER)
Admission: RE | Admit: 2023-05-22 | Discharge: 2023-05-22 | Disposition: A | Discharge status: Home / Self Care | Payer: No Typology Code available for payment source | Source: Ambulatory Visit | Attending: Family Medicine | Admitting: Family Medicine

## 2023-05-22 DIAGNOSIS — R0600 Dyspnea, unspecified: Secondary | ICD-10-CM | POA: Diagnosis not present

## 2023-05-22 DIAGNOSIS — R942 Abnormal results of pulmonary function studies: Secondary | ICD-10-CM | POA: Diagnosis not present

## 2023-05-22 DIAGNOSIS — R069 Unspecified abnormalities of breathing: Secondary | ICD-10-CM

## 2023-05-22 LAB — PRO B NATRIURETIC PEPTIDE: NT-Pro BNP: 101 pg/mL (ref 0–210)

## 2023-05-22 NOTE — Telephone Encounter (Signed)
 Yes - should have xray and restart the omeprazole. However, if feeling worse or any new symptoms should be seen in Urgent care or ER.

## 2023-05-22 NOTE — Telephone Encounter (Signed)
 Patient presented to the front desk wanting to make an appointment but states he did not want to see Dr.O again. Please get Dr.O's permission for him to change doctors and forward this back to the front desk pool.

## 2023-05-22 NOTE — Telephone Encounter (Signed)
 Called patient and encouraged he get the Xray done today so we can get those results and review them for concerns  Per note on AVS PCP would follow up with pulm and discuss plan.   Please advise

## 2023-05-22 NOTE — Telephone Encounter (Signed)
 Called no answer, LM to return call

## 2023-05-22 NOTE — Telephone Encounter (Signed)
 FYI Dr Val Eagle

## 2023-05-22 NOTE — Telephone Encounter (Signed)
 Copied from CRM [id number]. Topic: Clinical - Red Word Triage >> May 22, 2023 11:48 AM Almira Coaster wrote: Red Word that prompted transfer to Nurse Triage: Patient was seen yesterday due to difficulty breathing, Dr.Greene advised him that he will discuss with pulmonologist and ordered a chest x-ray. Patient is calling back today because he is still have the same symptoms.    Patient was seen in the office for SOB yesterday. He called to say that he is still having the same symptoms and it's not going away. Patient was advised to be seen in the ED if symptoms worsen or new symptoms occur. Patient denies worsening or new symptoms. Chest x-ray was ordered yesterday. Patient was advised to have that done. Patient stated he no longer wants to see pulmonologist Dr. Wynona Neat. Please advise of process to change doctors. Is a new referral from PCP needed?

## 2023-05-25 NOTE — Telephone Encounter (Signed)
 Called patient to let him know what Dr.Greene advised. Patient verbalized understanding to restart omperazole and to have a chest x-ray completed.  Patient voiced he did not want to See Dr.O any longer, I did notice in his chart the pulmonology office has enunciated the transfer of care. Patient stated still having some trouble breathing did advise patient per dr.greene if symptoms worsening to be seen at urgent care.

## 2023-05-26 NOTE — Telephone Encounter (Signed)
 Okay to switch?

## 2023-05-27 ENCOUNTER — Telehealth: Payer: Self-pay

## 2023-05-27 ENCOUNTER — Telehealth: Payer: Self-pay | Admitting: Pulmonary Disease

## 2023-05-27 NOTE — Telephone Encounter (Signed)
 I do not see referral to pulmonology and I cannot find documentation stating we would be sending him to pulmonology? Please advise if I have missed this direction somewhere and I am happy to place order and inform Patient of my mistake

## 2023-05-27 NOTE — Telephone Encounter (Signed)
 Recommendations?

## 2023-05-27 NOTE — Telephone Encounter (Signed)
 He has seen pulmonary, Dr. Aldean Ast, and had abnormal pulmonary function testing.  Plan for follow-up with his pulmonologist.  See telephone note 05/22/2023.  He presented to the front desk at pulmonary to make an appointment but requested to see different provider.  It looks like authorization from Dr. Aldean Ast yesterday to switch to other pulmonary provider.  He should be able to call pulmonary at this point to see if they can get him into a different pulmonologist, but I am happy to place another referral if that is helpful.  Let me know what they need.  I would like to have him seen soon given his persistent symptoms and that abnormal lung function testing.

## 2023-05-27 NOTE — Telephone Encounter (Signed)
Patient has been advise. 

## 2023-05-27 NOTE — Telephone Encounter (Signed)
 disregard

## 2023-05-27 NOTE — Telephone Encounter (Signed)
 Copied from CRM [id number]. Topic: General - Other >> May 27, 2023 10:27 AM Aletta Edouard wrote: Reason for CRM: patient had a referral for a pulmonologist and has not heard anything from the pulmonologist  regarding a appt he would like a call back

## 2023-05-28 ENCOUNTER — Telehealth: Payer: Self-pay | Admitting: Adult Health

## 2023-05-28 ENCOUNTER — Ambulatory Visit (INDEPENDENT_AMBULATORY_CARE_PROVIDER_SITE_OTHER): Admitting: Internal Medicine

## 2023-05-28 ENCOUNTER — Encounter: Payer: Self-pay | Admitting: Internal Medicine

## 2023-05-28 VITALS — BP 120/78 | HR 65 | Temp 97.6°F | Ht 66.0 in | Wt 179.0 lb

## 2023-05-28 DIAGNOSIS — J449 Chronic obstructive pulmonary disease, unspecified: Secondary | ICD-10-CM

## 2023-05-28 DIAGNOSIS — R140 Abdominal distension (gaseous): Secondary | ICD-10-CM | POA: Diagnosis not present

## 2023-05-28 DIAGNOSIS — G4719 Other hypersomnia: Secondary | ICD-10-CM

## 2023-05-28 DIAGNOSIS — K219 Gastro-esophageal reflux disease without esophagitis: Secondary | ICD-10-CM | POA: Diagnosis not present

## 2023-05-28 DIAGNOSIS — R0602 Shortness of breath: Secondary | ICD-10-CM

## 2023-05-28 DIAGNOSIS — R0683 Snoring: Secondary | ICD-10-CM

## 2023-05-28 MED ORDER — PANTOPRAZOLE SODIUM 40 MG PO TBEC: 40.0000 mg | DELAYED_RELEASE_TABLET | Freq: Every day | ORAL | 1 refills | Status: DC

## 2023-05-28 NOTE — Telephone Encounter (Signed)
 New Message:      Patient said he had a Sleep Study ordered since December. He said he was told that it had not been authorized by his insurance.

## 2023-05-28 NOTE — Patient Instructions (Addendum)
 Recommend starting Protonix for bloating and reflux Recommend second opinion with GI for upper endoscopy to assess for H. pylori and ulcers Recommend home sleep study to assess for sleep apnea Recommend pulmonary function test repeated in 3 months Recommend CT chest without contrast to assess lung fields  Avoid Allergens and Irritants Avoid secondhand smoke Avoid SICK contacts Recommend  Masking  when appropriate Recommend Keep up-to-date with vaccinations

## 2023-05-28 NOTE — Progress Notes (Signed)
 [facility]monary Medicine Consultation      Date: 05/28/2023,   MRN# [id number] Todd [name] [birth year]    CHIEF COMPLAINT:   Assessment of shortness of breath Assessment of excessive daytime sleepiness and snoring  HISTORY OF PRESENT ILLNESS    Patient  has been having sleep problems for many years Patient has been having excessive daytime sleepiness for a long time Patient has been having extreme fatigue and tiredness, lack of energy +  very Loud snoring every night + struggling breathe at night and gasps for air   Encouraged proper weight management.  Discussed driving precautions and its relationship with hypersomnolence.  Discussed operating dangerous equipment and its relationship with hypersomnolence.  Discussed sleep hygiene, and benefits of a fixed sleep waked time.  The importance of getting eight or more hours of sleep discussed with patient.  Discussed limiting the use of the computer and television before bedtime.  Decrease naps during the day, so night time sleep will become enhanced.  Limit caffeine, and sleep deprivation.  HTN, stroke, and heart failure are potential risk factors.   Discussed risk of untreated sleep apnea including cardiac arrhthymias, stroke, DM, pulm HTN.    EPWORTH SLEEP SCORE 10  +GERD and Bloating-uncontrolled gas Recommend PPI and GI consult for EGD   Pulmonary function test April 17, 2023 Findings reported to patient in detail Postbronchodilator FEV1 FVC ratio was 41% predicted FEV1 is 51% predicted No significant bronchodilator response Total lung capacity 93% predicted Elevated RV and RV/TLC ratio consistent with hyperinflation and air trapping DLCO within normal limits flow-volume loops show restrictive pattern Expiratory limb shows blunting and scooping Overall findings suggest severe obstructive lung disease  After further discussion with patient patient walks 1 mile a day without any issues No significant  shortness of breath cough or wheezing noted Patient is a non-smoker Nonalcoholic Patient does cook often  At this time there is no correlation between his symptoms and the pulmonary function test There is a discrepancy in his clinical exam and physical findings and pulmonary function testing  Patient's main complaint is that he has intermittent reflux and severe bloating He says that belching and burping relieves his abdominal pressure which then relieves his shortness of breath Patient has had colonoscopy to assess for bleeding and cancer however he has not had an upper endoscopy Patient is to be assessed for causes for ulcer and needs an upper endoscopy for further evaluation I will prescribe Protonix in the meantime   PAST MEDICAL HISTORY   Past Medical History:  Diagnosis Date   CAD (coronary artery disease)    Complicated migraine    Deafness in left ear    Diverticulosis    External hemorrhoids    Headache    Hypertension    Internal hemorrhoids    Palpitations    Stroke (HCC)    TIA (transient ischemic attack)    Tubular adenoma of colon    Vision abnormalities      SURGICAL HISTORY   Past Surgical History:  Procedure Laterality Date   APPENDECTOMY  12/01/11   lap appy   CORONARY STENT INTERVENTION N/A 07/17/2021   Procedure: CORONARY STENT INTERVENTION;  Surgeon: Kathleene Hazel, MD;  Location: [facility];  Service: Cardiovascular;  Laterality: N/A;   LAPAROSCOPIC APPENDECTOMY  12/02/2011   Procedure: APPENDECTOMY LAPAROSCOPIC;  Surgeon: Lodema Pilot, DO;  Location: [facility];  Service: General;  Laterality: N/A;   LEFT HEART CATH AND CORONARY ANGIOGRAPHY N/A 07/17/2021  Procedure: LEFT HEART CATH AND CORONARY ANGIOGRAPHY;  Surgeon: Kathleene Hazel, MD;  Location: [facility];  Service: Cardiovascular;  Laterality: N/A;     FAMILY HISTORY   Family History  Problem Relation Age of Onset   Hypertension Mother    Hypertension Father     Anemia Neg Hx    Arrhythmia Neg Hx    Asthma Neg Hx    Clotting disorder Neg Hx    Fainting Neg Hx    Heart attack Neg Hx    Heart disease Neg Hx    Heart failure Neg Hx    Hyperlipidemia Neg Hx    Colon cancer Neg Hx    Esophageal cancer Neg Hx    Stomach cancer Neg Hx    Rectal cancer Neg Hx      SOCIAL HISTORY   Social History   Tobacco Use   Smoking status: Never   Smokeless tobacco: Never  Vaping Use   Vaping status: Never Used  Substance Use Topics   Alcohol use: No   Drug use: No     MEDICATIONS    Home Medication:  Current Outpatient Rx   Order #: [id number] Class: Normal   Order #: [id number] Class: Historical Med   Order #: [id number] Class: Normal   Order #: [id number] Class: Normal   Order #: [id number] Class: Normal   Order #: [id number] Class: Normal   Order #: [id number] Class: Normal   Order #: [id number] Class: Normal    Current Medication:  Current Outpatient Medications:    aluminum-magnesium hydroxide-simethicone (MAALOX) 200-200-20 MG/5ML SUSP, Take 30 mLs by mouth 4 (four) times daily -  before meals and at bedtime., Disp: 355 mL, Rfl: 0   aspirin EC 81 MG tablet, Take 81 mg by mouth daily. Swallow whole., Disp: , Rfl:    fluocinolone (SYNALAR) 0.01 % external solution, Apply topically 2 (two) times daily. For 2 weeks max (Patient not taking: Reported on 02/26/2023), Disp: 60 mL, Rfl: 0   hydrocortisone (ANUSOL-HC) 2.5 % rectal cream, Place 1 Application rectally 2 (two) times daily., Disp: 30 g, Rfl: 0   lidocaine (XYLOCAINE) 2 % solution, Use as directed 15 mLs in the mouth or throat every 3 (three) hours as needed (throat pain. Gargle and spit as needed for pain). (Patient not taking: Reported on 02/26/2023), Disp: 100 mL, Rfl: 0   metoprolol succinate (TOPROL XL) 25 MG 24 hr tablet, Take 1 tablet (25 mg total) by mouth daily., Disp: 90 tablet, Rfl: 3   omeprazole (PRILOSEC) 20 MG capsule, Take 1 capsule (20 mg total) by mouth daily., Disp: 30 capsule,  Rfl: 2   rosuvastatin (CRESTOR) 20 MG tablet, TAKE 1 TABLET BY MOUTH EVERY DAY, Disp: 30 tablet, Rfl: 5    ALLERGIES   Wellbutrin [bupropion hcl]     REVIEW OF SYSTEMS    Review of Systems:  Gen:  Denies  fever, sweats, chills weigh loss  HEENT: Denies blurred vision, double vision, ear pain, eye pain, hearing loss, nose bleeds, sore throat Cardiac:  No dizziness, chest pain or heaviness, chest tightness,edema Resp:   Denies cough or sputum porduction, shortness of breath,wheezing, hemoptysis,  Gi: Denies swallowing difficulty, stomach pain, nausea or vomiting, diarrhea, constipation, bowel incontinence Gu:  Denies bladder incontinence, burning urine Ext:   Denies Joint pain, stiffness or swelling Skin: Denies  skin rash, easy bruising or bleeding or hives Endoc:  Denies polyuria, polydipsia , polyphagia or weight change Psych:   Denies depression, insomnia or hallucinations   Other:  All  other systems negative  BP 120/78 (BP Location: Left Arm, Patient Position: Sitting, Cuff Size: Normal)   Pulse 65   Temp 97.6 F (36.4 C) (Temporal)   Ht 5\' 6"  (1.676 m)   Wt 179 lb (81.2 kg)   SpO2 99%   BMI 28.89 kg/m      PHYSICAL EXAM  General Appearance: No distress  EYES PERRLA, EOM intact.   NECK Supple, No JVD Pulmonary: normal breath sounds, No wheezing.  CardiovascularNormal S1,S2.  No m/r/g.   Abdomen: Benign, Soft, non-tender. Skin:   warm, no rashes, no ecchymosis  Extremities: normal, no cyanosis, clubbing. Neuro:without focal findings,  speech normal  PSYCHIATRIC: Mood, affect within normal limits.   ALL OTHER ROS ARE NEGATIVE      IMAGING    DG Chest 2 View Result Date: 05/22/2023 CLINICAL DATA:  Episodic dyspnea. Severe obstruction on pulmonary function studies. EXAM: CHEST - 2 VIEW COMPARISON:  Radiographs 10/01/2022 and 07/22/2022. Cardiac CT 07/09/2021. FINDINGS: The heart size and mediastinal contours are stable. Aortic atherosclerosis and a  coronary artery stent are noted. The lungs are clear. There is no pleural effusion or pneumothorax. Stable mild degenerative changes in the spine without evidence of acute osseous abnormality. IMPRESSION: No evidence of acute cardiopulmonary process. Electronically Signed   By: Carey Bullocks M.D.   On: 05/22/2023 14:20      ASSESSMENT/PLAN   63 year old pleasant male seen today for signs symptoms of obstructive sleep apnea will need HST to assess along with intermittent shortness of breath likely due to increased belching and burping with the bloating Is pulmonary function tests shows severe obstructive lung disease however there is a discrepancy in his symptoms without   cough and wheezing.  Patient is exercise capacity seems to be intact as he walks 1 mile a day without any issues.   Recommend starting Protonix for bloating and reflux Recommend second opinion with GI for upper endoscopy to assess for H. pylori and ulcers Recommend home sleep study to assess for sleep apnea Recommend pulmonary function test repeated in 3 months Recommend CT chest without contrast to assess lung fields    CURRENT MEDICATIONS REVIEWED AT LENGTH WITH PATIENT TODAY   Patient  satisfied with Plan of action and management. All questions answered  Follow up 3 months  I spent a total of 45 minutes reviewing chart data, face-to-face evaluation with the patient, counseling and coordination of care as detailed above.     Lucie Leather, M.D.  [facility]  Medical Director [facility] Medical Director [facility] Cardio-Pulmonary Department

## 2023-05-29 ENCOUNTER — Encounter (HOSPITAL_COMMUNITY): Payer: Self-pay | Admitting: Emergency Medicine

## 2023-05-29 ENCOUNTER — Other Ambulatory Visit: Payer: Self-pay

## 2023-05-29 ENCOUNTER — Emergency Department (HOSPITAL_COMMUNITY)
Admission: EM | Admit: 2023-05-29 | Discharge: 2023-05-30 | Disposition: A | Discharge status: Home / Self Care | Attending: Emergency Medicine | Admitting: Emergency Medicine

## 2023-05-29 ENCOUNTER — Emergency Department (HOSPITAL_COMMUNITY)

## 2023-05-29 DIAGNOSIS — N289 Disorder of kidney and ureter, unspecified: Secondary | ICD-10-CM | POA: Diagnosis not present

## 2023-05-29 DIAGNOSIS — I10 Essential (primary) hypertension: Secondary | ICD-10-CM | POA: Diagnosis not present

## 2023-05-29 DIAGNOSIS — I2510 Atherosclerotic heart disease of native coronary artery without angina pectoris: Secondary | ICD-10-CM | POA: Insufficient documentation

## 2023-05-29 DIAGNOSIS — R0602 Shortness of breath: Secondary | ICD-10-CM | POA: Diagnosis present

## 2023-05-29 DIAGNOSIS — Z7982 Long term (current) use of aspirin: Secondary | ICD-10-CM | POA: Insufficient documentation

## 2023-05-29 LAB — COMPREHENSIVE METABOLIC PANEL
ALT: 28 U/L (ref 0–44)
AST: 31 U/L (ref 15–41)
Albumin: 3.9 g/dL (ref 3.5–5.0)
Alkaline Phosphatase: 44 U/L (ref 38–126)
Anion gap: 11 (ref 5–15)
BUN: 18 mg/dL (ref 8–23)
CO2: 22 mmol/L (ref 22–32)
Calcium: 9.1 mg/dL (ref 8.9–10.3)
Chloride: 107 mmol/L (ref 98–111)
Creatinine, Ser: 1.41 mg/dL — ABNORMAL HIGH (ref 0.61–1.24)
GFR, Estimated: 57 mL/min — ABNORMAL LOW (ref >60)
Glucose, Bld: 112 mg/dL — ABNORMAL HIGH (ref 70–99)
Potassium: 4.1 mmol/L (ref 3.5–5.1)
Sodium: 140 mmol/L (ref 135–145)
Total Bilirubin: 2.6 mg/dL — ABNORMAL HIGH (ref 0.0–1.2)
Total Protein: 6.6 g/dL (ref 6.5–8.1)

## 2023-05-29 LAB — CBC WITH DIFFERENTIAL/PLATELET
Abs Immature Granulocytes: 0.02 10*3/uL (ref 0.00–0.07)
Basophils Absolute: 0.1 10*3/uL (ref 0.0–0.1)
Basophils Relative: 1 %
Eosinophils Absolute: 0.1 10*3/uL (ref 0.0–0.5)
Eosinophils Relative: 1 %
HCT: 44.3 % (ref 39.0–52.0)
Hemoglobin: 15.4 g/dL (ref 13.0–17.0)
Immature Granulocytes: 0 %
Lymphocytes Relative: 24 %
Lymphs Abs: 2 10*3/uL (ref 0.7–4.0)
MCH: 30.3 pg (ref 26.0–34.0)
MCHC: 34.8 g/dL (ref 30.0–36.0)
MCV: 87 fL (ref 80.0–100.0)
Monocytes Absolute: 0.6 10*3/uL (ref 0.1–1.0)
Monocytes Relative: 8 %
Neutro Abs: 5.3 10*3/uL (ref 1.7–7.7)
Neutrophils Relative %: 66 %
Platelets: 237 10*3/uL (ref 150–400)
RBC: 5.09 MIL/uL (ref 4.22–5.81)
RDW: 12.4 % (ref 11.5–15.5)
WBC: 8 10*3/uL (ref 4.0–10.5)
nRBC: 0 % (ref 0.0–0.2)

## 2023-05-29 LAB — RESP PANEL BY RT-PCR (RSV, FLU A&B, COVID)  RVPGX2
Influenza A by PCR: NEGATIVE
Influenza B by PCR: NEGATIVE
Resp Syncytial Virus by PCR: NEGATIVE
SARS Coronavirus 2 by RT PCR: NEGATIVE

## 2023-05-29 LAB — TROPONIN I (HIGH SENSITIVITY): Troponin I (High Sensitivity): 3 ng/L (ref <18)

## 2023-05-29 LAB — LIPASE, BLOOD: Lipase: 30 U/L (ref 11–51)

## 2023-05-29 LAB — BRAIN NATRIURETIC PEPTIDE: B Natriuretic Peptide: 6.4 pg/mL (ref 0.0–100.0)

## 2023-05-29 NOTE — ED Provider Triage Note (Signed)
 Emergency Medicine Provider Triage Evaluation Note  Wilmot Quevedo [place] , a 63 y.o. male  was evaluated in triage.  Pt complains of shortness of breath. Reports he cannot take a deep breath for the past 2 years. Symptoms have been intermittent but returned 10 days ago. Has recently been seen by pulmonology. Has improvement of shortness of breath with burping he reports.  Review of Systems  Positive: Shortness of breath Negative: Chest pain  Physical Exam  BP (!) 145/93 (BP Location: Right Arm)   Pulse 93   Temp 98.4 F (36.9 C)   Resp 17   SpO2 98%  Gen:   Awake, no distress   Resp:  Normal effort  MSK:   Moves extremities without difficulty  Other:    Medical Decision Making  Medically screening exam initiated at 9:29 PM.  Appropriate orders placed.  [facility] was informed that the remainder of the evaluation will be completed by another provider, this initial triage assessment does not replace that evaluation, and the importance of remaining in the ED until their evaluation is complete.    Maxwell Marion, PA-C 05/29/23 2132

## 2023-05-29 NOTE — Telephone Encounter (Signed)
**Note De-Identified Todd [name] Obfuscation** Per the [employer] website: [employer] does not require a prior authorization for CPT code 81191 (Split Night Sleep Study).  I have made the pt aware of this and I have forwarded the Split Night Sleep Study order to the sleep lab so they can contact the pt to schedule the test.

## 2023-05-29 NOTE — ED Triage Notes (Signed)
 Pt in with sob and "trouble taking a deep breath", pt states this first began 32yrs ago. No pain reported, just having trouble deep breathing.

## 2023-05-30 ENCOUNTER — Other Ambulatory Visit (HOSPITAL_COMMUNITY)

## 2023-05-30 LAB — TROPONIN I (HIGH SENSITIVITY): Troponin I (High Sensitivity): 3 ng/L (ref <18)

## 2023-05-30 NOTE — ED Provider Notes (Signed)
 [facility] AT [facility] Provider Note   CSN: [id number] Arrival date & time: 05/29/23  2058     History  Chief Complaint  Patient presents with   Shortness of Breath    Todd [name] is a 63 y.o. male.  The history is provided by the patient.  Patient with extensive history including CAD, complicated migraine, CVA presents for shortness of breath.  Patient reports his symptoms started over 2 years ago.  He reports he has difficulty taking deep breath, but is able to do his normal functioning.  No chest pain.  No cough or hemoptysis. Reports over the past several days it has become worse No fevers or vomiting.  No leg swelling. He is already seeing 2 separate pulmonologist for these issues    Past Medical History:  Diagnosis Date   CAD (coronary artery disease)    Complicated migraine    Deafness in left ear    Diverticulosis    External hemorrhoids    Headache    Hypertension    Internal hemorrhoids    Palpitations    Stroke (HCC)    TIA (transient ischemic attack)    Tubular adenoma of colon    Vision abnormalities     Home Medications Prior to Admission medications   Medication Sig Start Date End Date Taking? Authorizing Provider  aspirin EC 81 MG tablet Take 81 mg by mouth daily. Swallow whole.    [provider]  metoprolol succinate (TOPROL XL) 25 MG 24 hr tablet Take 1 tablet (25 mg total) by mouth daily. 07/14/22   Hilty, Lisette Abu, MD  omeprazole (PRILOSEC) 20 MG capsule Take 1 capsule (20 mg total) by mouth daily. 05/21/23   Shade Flood, MD  pantoprazole (PROTONIX) 40 MG tablet Take 1 tablet (40 mg total) by mouth daily. 05/28/23 05/27/24  Erin Fulling, MD  rosuvastatin (CRESTOR) 20 MG tablet TAKE 1 TABLET BY MOUTH EVERY DAY 01/05/23   Hilty, Lisette Abu, MD      Allergies    Wellbutrin [bupropion hcl]    Review of Systems   Review of Systems  Constitutional:  Negative for fever.  Respiratory:  Positive for  shortness of breath. Negative for cough.   Cardiovascular:  Negative for chest pain and leg swelling.    Physical Exam Updated Vital Signs BP 124/80 (BP Location: Right Arm)   Pulse 67   Temp 97.6 F (36.4 C) (Oral)   Resp 15   Wt 81.2 kg   SpO2 98%   BMI 28.89 kg/m  Physical Exam CONSTITUTIONAL: Well developed/well nourished, no distress HEAD: Normocephalic/atraumatic ENMT: Mucous membranes moist NECK: supple no meningeal signs CV: S1/S2 noted, no murmurs/rubs/gallops noted LUNGS: Lungs are clear to auscultation bilaterally, no apparent distress ABDOMEN: soft, nontender NEURO: Pt is awake/alert/appropriate, moves all extremitiesx4.  No facial droop.   EXTREMITIES: pulses normal/equal, full ROM, no lower extremity edema or tenderness SKIN: warm, color normal PSYCH: no abnormalities of mood noted, alert and oriented to situation  ED Results / Procedures / Treatments   Labs (all labs ordered are listed, but only abnormal results are displayed) Labs Reviewed  COMPREHENSIVE METABOLIC PANEL - Abnormal; Notable for the following components:      Result Value   Glucose, Bld 112 (*)    Creatinine, Ser 1.41 (*)    Total Bilirubin 2.6 (*)    GFR, Estimated 57 (*)    All other components within normal limits  RESP PANEL BY RT-PCR (RSV,  FLU A&B, COVID)  [id number]  BRAIN NATRIURETIC PEPTIDE  LIPASE, BLOOD  CBC WITH DIFFERENTIAL/PLATELET  TROPONIN I (HIGH SENSITIVITY)  TROPONIN I (HIGH SENSITIVITY)    EKG EKG Interpretation Date/Time:  Friday May 29 2023 21:10:19 EST Ventricular Rate:  91 PR Interval:  144 QRS Duration:  78 QT Interval:  350 QTC Calculation: 430 R Axis:   -21  Text Interpretation: Normal sinus rhythm Inferior infarct , age undetermined Abnormal ECG When compared with ECG of 23-Oct-2022 09:53, PREVIOUS ECG IS PRESENT Confirmed by Zadie Rhine ([id number]) on 05/30/2023 12:34:10 AM  Radiology DG Chest Portable 1 View Result Date: 05/29/2023 CLINICAL DATA:   Shortness of breath EXAM: PORTABLE CHEST 1 VIEW COMPARISON:  05/22/2023 FINDINGS: The heart size and mediastinal contours are within normal limits. Both lungs are clear. The visualized skeletal structures are unremarkable. IMPRESSION: No active disease. Electronically Signed   By: Jasmine Pang M.D.   On: 05/29/2023 23:59    Procedures Procedures    Medications Ordered in ED Medications - No data to display  ED Course/ Medical Decision Making/ A&P Clinical Course as of 05/30/23 0159  Sat May 30, 2023  0111 Patient reports he feels like he cannot take a deep breath is occurred intermittently for years, recently worse.  He has been seen by 2 separate pulmonologist.  He ambulated without any hypoxia.  Resting vital signs are appropriate.  He is in no distress.  Labs reveal mild renal insufficiency that he can follow-up on as an outpatient [DW]  0112 Patient have requested a CT chest because his pulmonologist wanted to get done as an outpatient.  There is no emergent indication for CT chest at this time as I have low suspicion for acute PE [DW]  0159 Patient stable in the ER, workup overall unremarkable.  He is safe for discharge home [DW]    Clinical Course User Index [DW] Zadie Rhine, MD                                 Medical Decision Making  This patient presents to the ED for concern of shortness of breath, this involves an extensive number of treatment options, and is a complaint that carries with it a high risk of complications and morbidity.  The differential diagnosis includes but is not limited to Acute coronary syndrome, pneumonia, acute pulmonary edema, pneumothorax, acute anemia, pulmonary embolism    Comorbidities that complicate the patient evaluation: Patient's presentation is complicated by their history of CAD  Social Determinants of Health: Patient's  frequent ER visits   increases the complexity of managing their presentation  Additional history  obtained: Records reviewed previous admission documents and pulmonology notes reviewed  Lab Tests: I Ordered, and personally interpreted labs.  The pertinent results include: Mild renal insufficiency  Imaging Studies ordered: I ordered imaging studies including X-ray chest   I independently visualized and interpreted imaging which showed no acute findings I agree with the radiologist interpretation  Reevaluation: After the interventions noted above, I reevaluated the patient and found that they have :stayed the same  Complexity of problems addressed: Patient's presentation is most consistent with  acute presentation with potential threat to life or bodily function  Disposition: After consideration of the diagnostic results and the patient's response to treatment,  I feel that the patent would benefit from discharge   .           Final Clinical Impression(s) /  ED Diagnoses Final diagnoses:  SOB (shortness of breath)  Renal insufficiency    Rx / DC Orders ED Discharge Orders     None         Zadie Rhine, MD 05/30/23 [phone]

## 2023-05-30 NOTE — ED Notes (Signed)
 Pt ambulated without difficulty. Sats at 100% the whole time.

## 2023-06-01 ENCOUNTER — Ambulatory Visit (HOSPITAL_BASED_OUTPATIENT_CLINIC_OR_DEPARTMENT_OTHER): Attending: Adult Health | Admitting: Cardiovascular Disease

## 2023-06-01 ENCOUNTER — Telehealth: Payer: Self-pay | Admitting: Internal Medicine

## 2023-06-01 VITALS — Ht 67.0 in | Wt 179.0 lb

## 2023-06-01 DIAGNOSIS — R0683 Snoring: Secondary | ICD-10-CM

## 2023-06-01 DIAGNOSIS — G4733 Obstructive sleep apnea (adult) (pediatric): Secondary | ICD-10-CM

## 2023-06-01 NOTE — Telephone Encounter (Signed)
Okay to schedule with anyone 

## 2023-06-01 NOTE — Telephone Encounter (Signed)
 PT changed from Dr. Val Eagle to Dr. Belia Heman. States it was because of test result delay. Dr. Belia Heman has ordered a CT and PFT and PT wants these sched as soon as possible rather than " ...wait 2 weeks for someone to call me". I told him I would let our staff know. TY.

## 2023-06-02 ENCOUNTER — Other Ambulatory Visit: Payer: Self-pay

## 2023-06-02 ENCOUNTER — Telehealth: Payer: Self-pay

## 2023-06-02 DIAGNOSIS — F418 Other specified anxiety disorders: Secondary | ICD-10-CM

## 2023-06-02 DIAGNOSIS — J449 Chronic obstructive pulmonary disease, unspecified: Secondary | ICD-10-CM

## 2023-06-02 DIAGNOSIS — R0602 Shortness of breath: Secondary | ICD-10-CM

## 2023-06-02 MED ORDER — ALPRAZOLAM 0.25 MG PO TABS
0.2500 mg | ORAL_TABLET | Freq: Two times a day (BID) | ORAL | 0 refills | Status: DC | PRN
Start: 2023-06-02 — End: 2023-06-26

## 2023-06-02 NOTE — Telephone Encounter (Signed)
 Copied from CRM [id number]. Topic: Clinical - Medication Question >> Jun 02, 2023 11:21 AM Isabell A wrote: Reason for CRM:  Patient is requesting anxiety medication - states he was offered the medication before but he declined. Also, patient states he has a PFT test and he doesn't want to be nervous - no current symptoms at the moment, only when he thinks about it.

## 2023-06-02 NOTE — Telephone Encounter (Signed)
 I can send in a short term supply for alprazolam for now for acute anxiety symptoms, but let's follow up to discuss possible daily meds and treatment plan further. Controlled substance database (PDMP) reviewed. No concerns appreciated. Call pt - xanax short term for now, appt in next few weeks if possible.  Thanks.

## 2023-06-02 NOTE — Telephone Encounter (Signed)
 Patient is requesting anxiety medications and states he was offered them before but declined them. Looking back per Dr. Haze Justin notes on 07/10/2022 Dr. Aldean Ast was the prescribing dr. for Xanax temporarily. I could not find anything else pertaining to anxiety medications. Patient did report not taking xanax back on 11/04/2021. I do not seen any recent anxiety medication on his medication list. Patient has not been seen for a physical since 07/10/2022 has had many acute visits since. Please advise if any further information is needed.

## 2023-06-02 NOTE — Telephone Encounter (Signed)
 Called patient to discuss note from Dr.Greene. Told patient per Dr.Greene appointment needed to be made in the next few weeks to discuss medications further, Patient stated he would like to have an appointment on Wednesday anytime so I will get this scheduled/passed on to admin.  Patient was appreciative of Dr.greene filling in the medication.

## 2023-06-02 NOTE — Telephone Encounter (Signed)
 Did get patient scheduled for 07/08/2023. Soonest appt. Available.

## 2023-06-02 NOTE — Telephone Encounter (Signed)
 I have spoke with the patient and his CT has been scheduled at [facility] on 06/08/23 @ 1:00pm and he is aware. I have called RT at [facility] and left message to get PFT scheduled for patient. I will try to get the patient scheduled next week to pick up the HST machine from me

## 2023-06-03 NOTE — Telephone Encounter (Signed)
 Todd [name] the RT has the patient's PFT scheduled on 06/09/23 @ 1:00pm at [facility]

## 2023-06-04 NOTE — Progress Notes (Addendum)
 06/05/2023 Todd [name] [id number] Dec 31, 1960  Referring provider: Erin Fulling, MD Primary GI doctor: Dr. Rhea Belton  ASSESSMENT AND PLAN:   Bloating with associated SOB x 4 years with GERD, no dysphagia, no N/V, no untentional weight loss Had tonsillar abscess 02/2023, not worse after ABX CT of the abdomen and pelvis  February 2024 done with contrast for complaints of abdominal pain and this was negative. 10/01/2022 for abdominal ultrasound for bloating was unremarkable Has avoided food like brocolli, cheakpeas, etc He has GERD, worsening in last 6 months with frequent eructations,  Switched from 20 mg prilosec to pantoprazole 40 mg a few days ago without help but very recent  -Lifestyle changes discussed, avoid NSAIDS, ETOH, hand out given to the patient - continue pantoprazole 40 mg -Add on miralax for possible constipation contributing  EGD to evaluate for GERD, esophagitis, H pylori, etc with patient today, Dr. Rhea Belton does not have availability until May, scheduled with Dr. Tomasa Rand sooner. I discussed risks of discussed including risk of sedation, bleeding or perforation.  Patient provides understanding and gave verbal consent to proceed. -Diaphramatic breathing information given to patient -Consider SIBO testing if EGD negative  SOB Echo EF 65-70 2024 Not exertional, no chest pain Has seen several pulmonary doctors Pending PFTs, sleep study Able to complete walk test without issues Should be appropriate for [facility]  Screening colonoscopy colonoscopy in 06/2019 he did not have any recurrent polyps, was found to have grade 3 internal hemorrhoids, and also external hemorrhoids as well as a few diverticuli in the entire colon.  CAD s/p DES 2023 Echo EF 65-70 2024  Gilbert's syndrome Indirect elevated reassured    Patient Care Team: Shade Flood, MD as PCP - General (Family Medicine) Rennis Golden Lisette Abu, MD as PCP - Cardiology (Cardiology)  HISTORY OF  PRESENT ILLNESS: 63 y.o. male with a past medical history of personal history of adenomatous polyps, internal/external hemorrhoids, coronary artery disease he is status post MI and had a drug-eluting stent in 2023, also prior history of probable TIA, Most recent echo in 2023 EF 65 to 70%, constipation and others listed below presents for evaluation of bloating/GERD.   colonoscopy in 06/2019 he did not have any recurrent polyps, was found to have grade 3 internal hemorrhoids, and also external hemorrhoids as well as a few diverticuli in the entire colon. CT of the abdomen and pelvis  February 2024 done with contrast for complaints of abdominal pain and this was negative. 10/01/2022 for abdominal ultrasound for bloating was unremarkable 05/29/2023 ER visit for shortness of breath has seen 2 separate pulmonologist no coughing hemoptysis chest pain no leg swelling just difficulty with taking a deep breath patient had negative troponins negative lipase normal CBC without anemia, negative respiratory panel, chest x-ray unremarkable BNP normal c-Met with isolated elevated total bilirubin of 2.6 with an elevated in the past normal liver function creatinine 1.41.  Discussed the use of AI scribe software for clinical note transcription with the patient, who gave verbal consent to proceed.  History of Present Illness   Todd [name] is a 63 year old male with coronary artery disease who presents with bloating and shortness of breath.  He has experienced bloating for the past four years, which has worsened over the last two years following the placement of a cardiac stent. The bloating is persistent, occurring even without food intake, but is exacerbated by eating. Certain foods like broccoli, yogurt, sugar, and chickpeas trigger and worsen his symptoms.  He experiences shortness of breath, particularly after eating, described as 'air hunger.' This sensation occurs even when his oxygen levels are normal and  began after his cardiac stent placement two years ago. Despite pulmonary function tests showing 40% function, no conclusive explanation for his symptoms has been provided. The shortness of breath can occur at rest and is not associated with chest discomfort. No wheezing, coughing, or chest discomfort associated with shortness of breath.  He has a history of gastroesophageal reflux disease (GERD), which has worsened recently. He experiences frequent burping and has been taking pantoprazole 40 mg, which he started approximately ten days ago after discontinuing omeprazole. He is unsure if the new medication has made a difference yet. No nausea, vomiting, or dark stools.  He reports constipation, with bowel movements occurring every three to four days, which he feels is abnormal. He experiences epigastric discomfort and some discomfort in the left lower quadrant, but no significant pain. He denies significant weight loss, although he notes eating less and feeling full faster.  He had a tonsillar abscess in December, for which he was treated with antibiotics, and he felt better afterward. He has a family history of similar symptoms, as his cousin's brother experienced comparable issues. He does not smoke or drink alcohol.        He  reports that he has never smoked. He has never used smokeless tobacco. He reports that he does not drink alcohol and does not use drugs.  RELEVANT GI HISTORY, IMAGING AND LABS: Results   LABS Bilirubin (Indirect): elevated  DIAGNOSTIC Pulmonary function test: 40% function Colonoscopy: normal (2023) Sleep study: no apnea (03/17/2023)  PATHOLOGY Tonsillar abscess biopsy: abscess (02/2023)      CBC    Component Value Date/Time   WBC 8.0 05/29/2023 2146   RBC 5.09 05/29/2023 2146   HGB 15.4 05/29/2023 2146   HGB 15.4 09/19/2019 0814   HCT 44.3 05/29/2023 2146   HCT 44.7 09/19/2019 0814   PLT 237 05/29/2023 2146   PLT 253 09/19/2019 0814   MCV 87.0 05/29/2023  2146   MCV 90.1 10/03/2019 1741   MCV 89 09/19/2019 0814   MCH 30.3 05/29/2023 2146   MCHC 34.8 05/29/2023 2146   RDW 12.4 05/29/2023 2146   RDW 12.6 09/19/2019 0814   LYMPHSABS 2.0 05/29/2023 2146   MONOABS 0.6 05/29/2023 2146   EOSABS 0.1 05/29/2023 2146   BASOSABS 0.1 05/29/2023 2146   Recent Labs    07/22/22 0156 10/01/22 0016 02/24/23 0028 02/25/23 0451 03/14/23 1255 05/21/23 1412 05/29/23 2146  HGB 14.5 14.7 15.2 13.3 15.7 15.5 15.4    CMP     Component Value Date/Time   NA 140 05/29/2023 2146   NA 140 10/23/2022 1050   K 4.1 05/29/2023 2146   CL 107 05/29/2023 2146   CO2 22 05/29/2023 2146   GLUCOSE 112 (H) 05/29/2023 2146   BUN 18 05/29/2023 2146   BUN 13 10/23/2022 1050   CREATININE 1.41 (H) 05/29/2023 2146   CALCIUM 9.1 05/29/2023 2146   PROT 6.6 05/29/2023 2146   PROT 7.0 09/19/2019 0814   ALBUMIN 3.9 05/29/2023 2146   ALBUMIN 4.2 09/19/2019 0814   AST 31 05/29/2023 2146   ALT 28 05/29/2023 2146   ALKPHOS 44 05/29/2023 2146   BILITOT 2.6 (H) 05/29/2023 2146   BILITOT 1.1 09/19/2019 0814   GFRNONAA 57 (L) 05/29/2023 2146   GFRAA 101 09/19/2019 0814      Latest Ref Rng & Units 05/29/2023  9:46 PM 03/23/2023    1:30 PM 03/14/2023   12:55 PM  Hepatic Function  Total Protein 6.5 - 8.1 g/dL 6.6   6.8   Albumin 3.5 - 5.0 g/dL 3.9   3.8   AST 15 - 41 U/L 31   26   ALT 0 - 44 U/L 28   32   Alk Phosphatase 38 - 126 U/L 44   51   Total Bilirubin 0.0 - 1.2 mg/dL 2.6  1.5  2.1   Bilirubin, Direct 0.0 - 0.2 mg/dL  0.2        Current Medications:    Current Outpatient Medications (Cardiovascular):    metoprolol succinate (TOPROL XL) 25 MG 24 hr tablet, Take 1 tablet (25 mg total) by mouth daily.   rosuvastatin (CRESTOR) 20 MG tablet, TAKE 1 TABLET BY MOUTH EVERY DAY   Current Outpatient Medications (Analgesics):    aspirin EC 81 MG tablet, Take 81 mg by mouth daily. Swallow whole.   Current Outpatient Medications (Other):    ALPRAZolam (XANAX)  0.25 MG tablet, Take 1-2 tablets (0.25-0.5 mg total) by mouth 2 (two) times daily as needed for anxiety.   pantoprazole (PROTONIX) 40 MG tablet, Take 1 tablet (40 mg total) by mouth daily.   omeprazole (PRILOSEC) 20 MG capsule, Take 1 capsule (20 mg total) by mouth daily. (Patient not taking: Reported on 06/05/2023)  Medical History:  Past Medical History:  Diagnosis Date   CAD (coronary artery disease)    Complicated migraine    Deafness in left ear    Diverticulosis    External hemorrhoids    Headache    Hypertension    Internal hemorrhoids    Palpitations    Stroke (HCC)    TIA (transient ischemic attack)    Tubular adenoma of colon    Vision abnormalities    Allergies:  Allergies  Allergen Reactions   Wellbutrin [Bupropion Hcl] Itching and Rash     Surgical History:  He  has a past surgical history that includes laparoscopic appendectomy (12/02/2011); Appendectomy (12/01/11); LEFT HEART CATH AND CORONARY ANGIOGRAPHY (N/A, 07/17/2021); and CORONARY STENT INTERVENTION (N/A, 07/17/2021). Family History:  His family history includes Hypertension in his father and mother.  REVIEW OF SYSTEMS  : All other systems reviewed and negative except where noted in the History of Present Illness.  PHYSICAL EXAM: BP 100/70 (BP Location: Left Arm, Patient Position: Sitting, Cuff Size: Normal)   Pulse 60   Ht 5\' 5"  (1.651 m)   Wt 177 lb (80.3 kg)   BMI 29.45 kg/m  Physical Exam   GENERAL APPEARANCE: Well nourished, in no apparent distress. HEENT: No cervical lymphadenopathy, thyroid normal, sclerae anicteric, conjunctiva pink. RESPIRATORY: Respiratory effort normal, breath sounds clear bilaterally without rales, rhonchi, or wheezing. CARDIO: Regular rate and rhythm with no murmurs, rubs, or gallops, peripheral pulses intact. ABDOMEN: Soft, non-distended, active bowel sounds in all four quadrants, mild epigastric and left lower quadrant discomfort on palpation, no rebound, no mass  appreciated. RECTAL: Declines. MUSCULOSKELETAL: Full range of motion, normal gait, without edema. SKIN: Dry, intact without rashes or lesions. No jaundice. NEURO: Alert, oriented, no focal deficits. PSYCH: Cooperative, normal mood and affect. NECK: Neck supple, no lymphadenopathy, thyroid normal. EXTREMITIES: No edema in lower extremities.      Doree Albee, PA-C 9:49 AM

## 2023-06-04 NOTE — Telephone Encounter (Signed)
 nfn

## 2023-06-04 NOTE — Telephone Encounter (Signed)
 NFN

## 2023-06-05 ENCOUNTER — Ambulatory Visit: Admitting: Physician Assistant

## 2023-06-05 ENCOUNTER — Encounter: Payer: Self-pay | Admitting: Physician Assistant

## 2023-06-05 VITALS — BP 100/70 | HR 60 | Ht 65.0 in | Wt 177.0 lb

## 2023-06-05 DIAGNOSIS — K5900 Constipation, unspecified: Secondary | ICD-10-CM

## 2023-06-05 DIAGNOSIS — R140 Abdominal distension (gaseous): Secondary | ICD-10-CM

## 2023-06-05 DIAGNOSIS — K219 Gastro-esophageal reflux disease without esophagitis: Secondary | ICD-10-CM | POA: Diagnosis not present

## 2023-06-05 DIAGNOSIS — R0602 Shortness of breath: Secondary | ICD-10-CM

## 2023-06-05 DIAGNOSIS — I25110 Atherosclerotic heart disease of native coronary artery with unstable angina pectoris: Secondary | ICD-10-CM

## 2023-06-05 DIAGNOSIS — Z8719 Personal history of other diseases of the digestive system: Secondary | ICD-10-CM

## 2023-06-05 NOTE — Patient Instructions (Addendum)
 Silent reflux: Not all heartburn burns...Marland KitchenMarland KitchenMarland Kitchen  What is LPR? Laryngopharyngeal reflux (LPR) or silent reflux is a condition in which acid that is made in the stomach travels up the esophagus (swallowing tube) and gets to the throat. Not everyone with reflux has a lot of heartburn or indigestion. In fact, many people with LPR never have heartburn. This is why LPR is called SILENT REFLUX, and the terms "Silent reflux" and "LPR" are often used interchangeably. Because LPR is silent, it is sometimes difficult to diagnose.  How can you tell if you have LPR?  Chronic hoarseness- Some people have hoarseness that comes and goes throat clearing  Cough It can cause shortness of breath and cause asthma like symptoms. a feeling of a lump in the throat  difficulty swallowing a problem with too much nose and throat drainage.  Some people will feel their esophagus spasm which feels like their heart beating hard and fast, this will usually be after a meal, at rest, or lying down at night.    How do I treat this? Treatment for LPR should be individualized, and your doctor will suggest the best treatment for you. Generally there are several treatments for LPR: changing habits and diet to reduce reflux,  medications to reduce stomach acid, and  surgery to prevent reflux. Most people with LPR need to modify how and when they eat, as well as take some medication, to get well. Sometimes, nonprescription liquid antacids, such as Maalox, Gelucil and Mylanta are recommended. When used, these antacids should be taken four times each day - one tablespoon one hour after each meal and before bedtime. Dietary and lifestyle changes alone are not often enough to control LPR - medications that reduce stomach acid are also usually needed. These must be prescribed by our doctor.   TIPS FOR REDUCING REFLUX AND LPR Control your LIFE-STYLE and your DIET! If you use tobacco, QUIT.  Smoking makes you reflux. After every  cigarette you have some LPR.  Don't wear clothing that is too tight, especially around the waist (trousers, corsets, belts).  Do not lie down just after eating...in fact, do not eat within three hours of bedtime.  You should be on a low-fat diet.  Limit your intake of red meat.  Limit your intake of butter.  Avoid fried foods.  Avoid chocolate  Avoid cheese.  Avoid eggs. Specifically avoid caffeine (especially coffee and tea), soda pop (especially cola) and mints.  Avoid alcoholic beverages, particularly in the evening.   You have been scheduled for an endoscopy. Please follow written instructions given to you at your visit today.  If you use inhalers (even only as needed), please bring them with you on the day of your procedure.     Miralax is an osmotic laxative.  It only brings more water into the stool.  This is safe to take daily.  Can take up to 17 gram of miralax twice a day.  Mix with juice or coffee.  Start 1 capful at night for 3-4 days and reassess your response in 3-4 days.  You can increase and decrease the dose based on your response.  Remember, it can take up to 3-4 days to take effect OR for the effects to wear off.   I often pair this with benefiber in the morning to help assure the stool is not too loose.   Toileting tips to help with your constipation - Drink at least 64-80 ounces of water/liquid per day. - Establish a time  to try to move your bowels every day.  For many people, this is after a cup of coffee or after a meal such as breakfast. - Sit all of the way back on the toilet keeping your back fairly straight and while sitting up, try to rest the tops of your forearms on your upper thighs.   - Raising your feet with a step stool/squatty potty can be helpful to improve the angle that allows your stool to pass through the rectum. - Relax the rectum feeling it bulge toward the toilet water.  If you feel your rectum raising toward your body, you are  contracting rather than relaxing. - Breathe in and slowly exhale. "Belly breath" by expanding your belly towards your belly button. Keep belly expanded as you gently direct pressure down and back to the anus.  A low pitched GRRR sound can assist with increasing intra-abdominal pressure.  (Can also trying to blow on a pinwheel and make it move, this helps with the same belly breathing) - Repeat 3-4 times. If unsuccessful, contract the pelvic floor to restore normal tone and get off the toilet.  Avoid excessive straining. - To reduce excessive wiping by teaching your anus to normally contract, place hands on outer aspect of knees and resist knee movement outward.  Hold 5-10 second then place hands just inside of knees and resist inward movement of knees.  Hold 5 seconds.  Repeat a few times each way.  Go to the ER if unable to pass gas, severe AB pain, unable to hold down food, any shortness of breath of chest pain.  Small intestinal bacterial overgrowth (SIBO) occurs when there is an abnormal increase in the overall bacterial population in the small intestine -- particularly types of bacteria not commonly found in that part of the digestive tract. Small intestinal bacterial overgrowth (SIBO) commonly results when a circumstance -- such as surgery or disease -- slows the passage of food and waste products in the digestive tract, creating a breeding ground for bacteria.  Signs and symptoms of SIBO often include: Loss of appetite Abdominal pain Nausea Bloating An uncomfortable feeling of fullness after eating Diarrhea or constipation, depending on the type of gas produced  What foods trigger SIBO? While foods aren't the original cause of SIBO, certain foods do encourage the overgrowth of the wrong bacteria in your small intestine. If you're feeding them their favorite foods, they're going to grow more, and that will trigger more of your SIBO symptoms. By the same token, you can help reduce the  overgrowth by starving the problematic bacteria of their favorite foods. This strategy has led to a number of proposed SIBO eating plans. The plans vary, and so do individual results. But in general, they tend to recommend limiting carbohydrates.  These include: Sugars and sweeteners. Fruits and starchy vegetables. Dairy products. Grains.  There is a test for this we can do called a breath test, if you are positive we will treat you with an antibiotic to see if it helps.  Your symptoms are very suspicious for this condition, as discussed, we will start you on an antibiotic to see if this helps.   Abdominal bloating and discomfort may be due to intestinal sensitivity or symptoms of irritable bowel syndrome. To relieve symptoms, avoid:  Broccoli  Baked beans  Cabbage  Carbonated drinks  Cauliflower  Chewing gum  Hard candy Abdominal distention resulting from weak abdominal muscles:  Is better in the morning  Gets worse as the day  progresses  Is relieved by lying down Flatulence is gas created through bacterial action in the bowel and passed rectally. Keep in mind that:  10-18 passages per day are normal  Primary gases are harmless and odorless  Noticeable smells are trace gases related to food intake Foods to AVOID that are likely to form gas include:  Milk, dairy products, and medications that contain lactose--If your body doesn't produce the enzyme (lactase) to break it down.  Certain vegetables--baked beans, cauliflower, broccoli, cabbage  Certain starches--wheat, oats, corn, potatoes. Rice is a good substitute. Identify offending foods. Reduce or eliminate these gas-forming foods from your diet. Can look at the FODMAP diet.   Diaphragmatic Breathing Diaphragmatic breathing is a breathing exercise that uses the dome-shaped muscle (diaphragm) separating your chest from your abdomen. Using this muscle to breathe is the best way to breathe deeply. When doing diaphragmatic breathing, it  is important to feel the movement of your abdomen. It should expand when you breathe in (inhale) and go down when you breathe out (exhale). This type of breathing is also called belly breathing. You may be asked to learn diaphragmatic breathing if: You have a lung disease like chronic obstructive pulmonary disease (COPD). You have shortness of breath at rest or with activity. You have anxiety or trouble relaxing. You have respiratory complications after surgery. How to perform diaphragmatic breathing You should practice this breathing exercise several times a day until you are comfortable doing it at rest. Then use diaphragmatic breathing while doing activity to decrease your shortness of breath. Before you begin, take a minute to close your eyes and relax your shoulders, chest, and neck. You can do this breathing exercise lying on your back or sitting in a chair. Place both hands on your abdomen just under your rib cage. Breathe in slowly and deeply through your nose. Focus on your diaphragm or abdomen to breathe. The hand on your abdomen should rise. Breathe out slowly through pursed (puckered) lips as if you are blowing out a candle. The hand on your abdomen should lower. Repeat steps 4 and 5 as told by your health care provider. Ask your health care provider how many breaths to take and how often to do the breathing exercise. Follow these instructions at home:  Take over-the-counter and prescription medicines only as told by your health care provider. Do not use any products that contain nicotine or tobacco. These products include cigarettes, chewing tobacco, and vaping devices, such as e-cigarettes. If you need help quitting, ask your health care provider. Return to your normal activities as told by your health care provider. Ask your health care provider what activities are safe for you. Keep all follow-up visits. This is important. Where to find more information American Lung  Association: lung.org COPD Foundation: copdfoundation.org Contact a health care provider if: Your shortness of breath gets worse. You become less able to exercise or be physically active. You develop a cough. You develop a fever. You experience problems with this breathing technique. Get help right away if: You are struggling to breathe. Your shortness of breath prevents you from doing any activity. These symptoms may represent a serious problem that is an emergency. Do not wait to see if the symptoms will go away. Get medical help right away. Call your local emergency services (911 in the [place] Do not drive yourself to the hospital. Summary Diaphragmatic breathing is a breathing exercise that uses the dome-shaped muscle (diaphragm) separating your chest from your abdomen. Using this muscle  to breathe is the best way to breathe deeply. You can do diaphragmatic breathing lying on your back or sitting in a chair. When doing this breathing exercise, it is important to feel the movement of your abdomen. It should expand when you breathe in and go down when you breathe out. Follow your health care provider's instructions about how often to do diaphragmatic breathing and how many breaths to take. This information is not intended to replace advice given to you by your health care provider. Make sure you discuss any questions you have with your health care provider. Document Revised: 01/16/2023 Document Reviewed: 01/16/2023 Elsevier Patient Education  2024 [employer].  I appreciate the opportunity to care for you. Quentin Mulling, PA

## 2023-06-08 ENCOUNTER — Ambulatory Visit
Admission: RE | Admit: 2023-06-08 | Discharge: 2023-06-08 | Disposition: A | Discharge status: Home / Self Care | Source: Ambulatory Visit | Attending: Family Medicine | Admitting: Family Medicine

## 2023-06-08 DIAGNOSIS — R0602 Shortness of breath: Secondary | ICD-10-CM | POA: Diagnosis present

## 2023-06-08 DIAGNOSIS — J449 Chronic obstructive pulmonary disease, unspecified: Secondary | ICD-10-CM | POA: Insufficient documentation

## 2023-06-09 ENCOUNTER — Ambulatory Visit (HOSPITAL_COMMUNITY)
Admission: RE | Admit: 2023-06-09 | Discharge: 2023-06-09 | Disposition: A | Discharge status: Home / Self Care | Source: Ambulatory Visit | Attending: Internal Medicine | Admitting: Internal Medicine

## 2023-06-09 DIAGNOSIS — J449 Chronic obstructive pulmonary disease, unspecified: Secondary | ICD-10-CM | POA: Diagnosis present

## 2023-06-09 DIAGNOSIS — R0602 Shortness of breath: Secondary | ICD-10-CM | POA: Insufficient documentation

## 2023-06-09 LAB — PULMONARY FUNCTION TEST
DL/VA % pred: 97 %
DL/VA: 4.19 ml/min/mmHg/L
DLCO cor % pred: 98 %
DLCO cor: 23.18 ml/min/mmHg
DLCO unc % pred: 101 %
DLCO unc: 23.68 ml/min/mmHg
FEF 25-75 Post: 3.21 L/s
FEF 25-75 Pre: 2.83 L/s
FEF2575-%Change-Post: 13 %
FEF2575-%Pred-Post: 129 %
FEF2575-%Pred-Pre: 114 %
FEV1-%Change-Post: 3 %
FEV1-%Pred-Post: 104 %
FEV1-%Pred-Pre: 100 %
FEV1-Post: 3.09 L
FEV1-Pre: 2.98 L
FEV1FVC-%Change-Post: 2 %
FEV1FVC-%Pred-Pre: 103 %
FEV6-%Change-Post: 1 %
FEV6-%Pred-Post: 103 %
FEV6-%Pred-Pre: 101 %
FEV6-Post: 3.84 L
FEV6-Pre: 3.79 L
FEV6FVC-%Change-Post: 0 %
FEV6FVC-%Pred-Post: 104 %
FEV6FVC-%Pred-Pre: 104 %
FVC-%Change-Post: 1 %
FVC-%Pred-Post: 98 %
FVC-%Pred-Pre: 97 %
FVC-Post: 3.87 L
FVC-Pre: 3.82 L
Post FEV1/FVC ratio: 80 %
Post FEV6/FVC ratio: 99 %
Pre FEV1/FVC ratio: 78 %
Pre FEV6/FVC Ratio: 99 %
RV % pred: 107 %
RV: 2.13 L
TLC % pred: 100 %
TLC: 6.02 L

## 2023-06-09 MED ORDER — ALBUTEROL SULFATE (2.5 MG/3ML) 0.083% IN NEBU
2.5000 mg | INHALATION_SOLUTION | Freq: Once | RESPIRATORY_TRACT | Status: AC
  Administered 2023-06-09: 2.5 mg via RESPIRATORY_TRACT

## 2023-06-11 ENCOUNTER — Encounter: Payer: Self-pay | Admitting: Gastroenterology

## 2023-06-11 ENCOUNTER — Ambulatory Visit: Admitting: Gastroenterology

## 2023-06-11 VITALS — BP 110/65 | HR 70 | Temp 98.5°F | Resp 19 | Ht 65.0 in | Wt 177.0 lb

## 2023-06-11 DIAGNOSIS — R140 Abdominal distension (gaseous): Secondary | ICD-10-CM

## 2023-06-11 DIAGNOSIS — K2289 Other specified disease of esophagus: Secondary | ICD-10-CM

## 2023-06-11 DIAGNOSIS — K2950 Unspecified chronic gastritis without bleeding: Secondary | ICD-10-CM

## 2023-06-11 DIAGNOSIS — K3189 Other diseases of stomach and duodenum: Secondary | ICD-10-CM

## 2023-06-11 DIAGNOSIS — K449 Diaphragmatic hernia without obstruction or gangrene: Secondary | ICD-10-CM

## 2023-06-11 MED ORDER — SODIUM CHLORIDE 0.9 % IV SOLN: 500.0000 mL | Freq: Once | INTRAVENOUS | Status: DC

## 2023-06-11 MED ORDER — PANTOPRAZOLE SODIUM 40 MG PO TBEC
40.0000 mg | DELAYED_RELEASE_TABLET | Freq: Two times a day (BID) | ORAL | 1 refills | Status: DC
Start: 2023-06-11 — End: 2023-07-07

## 2023-06-11 NOTE — Patient Instructions (Addendum)
 Resume previous diet and medications.  Increase pantoprazole to twice daily for 4 weeks.  If you feel that this is not working for you, please call the office - Dr. Tomasa Rand may change the medication.  Avoid NSAIDS.  Education attached on hiatal hernia.    YOU HAD AN ENDOSCOPIC PROCEDURE TODAY AT THE [facility]:   Refer to the procedure report that was given to you for any specific questions about what was found during the examination.  If the procedure report does not answer your questions, please call your gastroenterologist to clarify.  If you requested that your care partner not be given the details of your procedure findings, then the procedure report has been included in a sealed envelope for you to review at your convenience later.  YOU SHOULD EXPECT: Some feelings of bloating in the abdomen. Passage of more gas than usual.  Walking can help get rid of the air that was put into your GI tract during the procedure and reduce the bloating. If you had a lower endoscopy (such as a colonoscopy or flexible sigmoidoscopy) you may notice spotting of blood in your stool or on the toilet paper. If you underwent a bowel prep for your procedure, you may not have a normal bowel movement for a few days.  Please Note:  You might notice some irritation and congestion in your nose or some drainage.  This is from the oxygen used during your procedure.  There is no need for concern and it should clear up in a day or so.  SYMPTOMS TO REPORT IMMEDIATELY:  Following upper endoscopy (EGD)  Vomiting of blood or coffee ground material  New chest pain or pain under the shoulder blades  Painful or persistently difficult swallowing  New shortness of breath  Fever of 100F or higher  Black, tarry-looking stools  For urgent or emergent issues, a gastroenterologist can be reached at any hour by calling [phone]. Do not use MyChart messaging for urgent concerns.    DIET:  We do recommend a small  meal at first, but then you may proceed to your regular diet.  Drink plenty of fluids but you should avoid alcoholic beverages for 24 hours.  ACTIVITY:  You should plan to take it easy for the rest of today and you should NOT DRIVE or use heavy machinery until tomorrow (because of the sedation medicines used during the test).    FOLLOW UP: Our staff will call the number listed on your records the next business day following your procedure.  We will call around 7:15- 8:00 am to check on you and address any questions or concerns that you may have regarding the information given to you following your procedure. If we do not reach you, we will leave a message.     If any biopsies were taken you will be contacted by phone or by letter within the next 1-3 weeks.  Please call us at [phone] if you have not heard about the biopsies in 3 weeks.    SIGNATURES/CONFIDENTIALITY: You and/or your care partner have signed paperwork which will be entered into your electronic medical record.  These signatures attest to the fact that that the information above on your After Visit Summary has been reviewed and is understood.  Full responsibility of the confidentiality of this discharge information lies with you and/or your care-partner.

## 2023-06-11 NOTE — Progress Notes (Signed)
 History and Physical Interval Note:  06/11/2023 11:42 AM  [name]  has presented today for endoscopic procedure(s), with the diagnosis of  Encounter Diagnosis  Name Primary?   Abdominal bloating Yes  .  The various methods of evaluation and treatment have been discussed with the patient and/or family. After consideration of risks, benefits and other options for treatment, the patient has consented to  the endoscopic procedure(s).   The patient's history has been reviewed, patient examined, no change in status, stable for endoscopic procedure(s).  I have reviewed the patient's chart and labs.  Questions were answered to the patient's satisfaction.     Syaire Saber E. Tomasa Rand, MD [facility]

## 2023-06-11 NOTE — Progress Notes (Signed)
Agree with the assessment and plan as outlined by Amanda Collier,  PA-C.  Scott E. Cunningham, MD  

## 2023-06-11 NOTE — Progress Notes (Signed)
 Sedate, gd SR, tolerated procedure well, VSS, report to RN

## 2023-06-11 NOTE — Op Note (Signed)
 [facility] Patient Name: Todd [name] Procedure Date: 06/11/2023 11:38 AM MRN: [id number] Endoscopist: Lorin Picket E. Tomasa Rand , MD, [id number] Age: 63 Referring MD:  Date of Birth: 1961/03/19 Gender: Male Account #: [id number] Procedure:                Upper GI endoscopy Indications:              Abdominal bloating Medicines:                Monitored Anesthesia Care Procedure:                Pre-Anesthesia Assessment:                           - Prior to the procedure, a History and Physical                            was performed, and patient medications and                            allergies were reviewed. The patient's tolerance of                            previous anesthesia was also reviewed. The risks                            and benefits of the procedure and the sedation                            options and risks were discussed with the patient.                            All questions were answered, and informed consent                            was obtained. Prior Anticoagulants: The patient has                            taken no anticoagulant or antiplatelet agents                            except for aspirin. ASA Grade Assessment: III - A                            patient with severe systemic disease. After                            reviewing the risks and benefits, the patient was                            deemed in satisfactory condition to undergo the                            procedure.  After obtaining informed consent, the endoscope was                            passed under direct vision. Throughout the                            procedure, the patient's blood pressure, pulse, and                            oxygen saturations were monitored continuously. The                            GIF [id number] #[id number] was introduced through the                            mouth, and advanced to the second part of duodenum.                             The upper GI endoscopy was accomplished without                            difficulty. The patient tolerated the procedure                            well. Scope In: Scope Out: Findings:                 The examined portions of the nasopharynx,                            oropharynx and larynx were normal.                           The Z-line was irregular.                           The exam of the esophagus was otherwise normal.                           Localized mild inflammation characterized by                            erythema and granularity was found in the gastric                            antrum. Biopsies were taken with a cold forceps for                            Helicobacter pylori testing. Estimated blood loss                            was minimal.                           The exam of the stomach was otherwise normal.  A 3 cm hiatal hernia was present.                           Patchy mildly erythematous mucosa was found in the                            duodenal bulb.                           The exam of the duodenum was otherwise normal. Complications:            No immediate complications. Estimated Blood Loss:     Estimated blood loss was minimal. Impression:               - The examined portions of the nasopharynx,                            oropharynx and larynx were normal.                           - Z-line irregular.                           - Gastritis. Biopsied.                           - 3 cm hiatal hernia.                           - Erythematous duodenopathy. Recommendation:           - Patient has a contact number available for                            emergencies. The signs and symptoms of potential                            delayed complications were discussed with the                            patient. Return to normal activities tomorrow.                            Written discharge instructions were  provided to the                            patient.                           - Resume previous diet.                           - Increase pantoprazole to twice daily for 4 weeks                            to see if this helps symptoms/heals  gastritis/duodenitis                           - Await pathology results. Further recommendations                            will be based on pathology results                           - Avoid NSAIDs Isais Klipfel E. Tomasa Rand, MD 06/11/2023 12:03:52 PM This report has been signed electronically.

## 2023-06-11 NOTE — Progress Notes (Signed)
 Called to room to assist during endoscopic procedure.  Patient ID and intended procedure confirmed with present staff. Received instructions for my participation in the procedure from the performing physician.

## 2023-06-12 ENCOUNTER — Telehealth: Payer: Self-pay | Admitting: Gastroenterology

## 2023-06-12 ENCOUNTER — Telehealth: Payer: Self-pay | Admitting: *Deleted

## 2023-06-12 NOTE — Telephone Encounter (Signed)
  Follow up Call-     06/11/2023   10:33 AM  Call back number  Post procedure Call Back phone  # [phone]  Permission to leave phone message Yes     Patient questions:  Message left to call us if necessary.

## 2023-06-12 NOTE — Telephone Encounter (Addendum)
 Inbound call from patient stating that he had a endoscopy  yesterday and wanted to scheduled an appointment with Quentin Mulling to discuss surgery? I advised patient that the next available was April 25th. He stated stated that he will be going over [place] and wanted to discuss before he left. Patient is requesting a call back to discuss. Please advise.

## 2023-06-15 ENCOUNTER — Telehealth: Payer: Self-pay | Admitting: Adult Health

## 2023-06-15 NOTE — Telephone Encounter (Signed)
 Scheduled pt to see Quentin Mulling PA 06/24/23 @1 :30pm. Please let pt know about the appt.

## 2023-06-15 NOTE — Telephone Encounter (Signed)
 Pending pathology from EGD, will review and discuss with patient at follow up 06/24/23

## 2023-06-15 NOTE — Telephone Encounter (Signed)
 What problem are you experiencing? no  Who is your medical equipment company?   3)    If patient is calling about their sleep study results please route to [facility] Sleep Study Pool. Pt calling to get results   Please route to the sleep study coordinator.

## 2023-06-16 LAB — SURGICAL PATHOLOGY

## 2023-06-17 ENCOUNTER — Encounter

## 2023-06-17 ENCOUNTER — Encounter (HOSPITAL_BASED_OUTPATIENT_CLINIC_OR_DEPARTMENT_OTHER): Payer: Self-pay | Admitting: Cardiovascular Disease

## 2023-06-17 NOTE — Procedures (Signed)
 [facility] [address] [place], [place] [address] Tel: [phone]   Fax: [phone]  Polysomnography Interpretation  Patient Name:  Madex, Seals Date:  06/01/2023 Referring Physician:  Dr. Bettey Mare. Lawrence  Indications for Polysomnography The patient is a 63 year old Male who is 5\' 7"  and weighs 179.0 lbs. His BMI equals 28.1.  A full night polysomnogram was performed to evaluate for OSA.  Medication  No medications were reported taken during the night while at the Sleep Disorder Center.   Polysomnogram Data A full night polysomnogram recorded the standard physiologic parameters including EEG, EOG, EMG, EKG, nasal and oral airflow.  Respiratory parameters of chest and abdominal movements were recorded with Respiratory Inductance Plethysmography belts.  Oxygen saturation was recorded by pulse oximetry.   Sleep Architecture The total recording time of the polysomnogram was 394.5 minutes.  The total sleep time was 300.5 minutes.  The patient spent 13.0% of total sleep time in Stage N1, 77.4% in Stage N2, 0.7% in Stages N3, and 9.0% in REM.  Sleep latency was 8.8 minutes.  REM latency was 86.5 minutes.  Sleep Efficiency was 76.2%.  Wake after Sleep Onset time was 85.0 minutes.  Respiratory Events The polysomnogram revealed a presence of - obstructive, - central, and - mixed apneas resulting in an Apnea index of - events per hour.  There were 35 hypopneas (>=3% desaturation and/or arousal) resulting in an Apnea\Hypopnea Index (AHI >=3% desaturation and/or arousal) of 7.0 events per hour. Supine sleep AHI was 10.8/h. There were 10 hypopneas (>=4% desaturation) resulting in an Apnea\Hypopnea Index (AHI >=4% desaturation) of 2.0 events per hour.  There were 38 Respiratory Effort Related Arousals resulting in a RERA index of 7.6 events per hour. The Respiratory Disturbance Index is 14.6 events per hour.  The snore index was 81.9 events per  hour.  Mean oxygen saturation was 93.1%.  The lowest oxygen saturation during sleep was 90.0%.  Time spent <=88% oxygen saturation was - minutes (-).  End Tidal CO2 during sleep ranged from - to - mmHg. End Tidal CO2 was greater than 50 mmHg for - minutes and greater than 55 mmHg for - minutes.  Limb Activity There were - total limb movements recorded, of this total, - were classified as PLMs.  PLM index was - per hour and PLM associated with Arousals index was - per hour.  Cardiac Summary The average pulse rate was 57.6 bpm.  The minimum pulse rate was 49.0 bpm while the maximum pulse rate was 88.0 bpm.  Cardiac rhythm was normal/abnormal.  Diagnosis:  Mild OSA with AHI 7.0/h overall, and during supine sleep AHI 10.8/h. Loud snoring   Recommendations: Recommend a trial of Auto-PAP with EPR of 3 at 6 - 15 cm of water with heated humidification. Effort should be made to optimize nasal and oropharyngeal patency. Avoidance of alcohol and sedatives which may exacerbate sleep disordered breathing. Weight loss and exercise.  Recommend a download in 4 - 6 weeks and sleep clinic evaluation.   This study was personally reviewed and electronically signed by: Dr. Nicki Guadalajara Accredited Board Certified in Sleep Medicine Date/Time:         Diagnostic PSG Report  Patient Name: Burnell, Hurta Date: 06/01/2023  Date of Birth: 1960-07-20 Study Type: NPSG  Age: 25 year MRN #: [id number]  Sex: Male Interpreting Physician: Nicki Guadalajara [id number]  Height: 5\' 7"  Referring Physician: Dr. Bettey Mare. Lawrence  Weight: 179.0 lbs Recording Tech: Ulyess Mort RRT  RPSGT RST  BMI: 28.1 Scoring Tech: Ulyess Mort RRT RPSGT RST  ESS: 2 Neck Size: 14.5   Study Overview  Lights Off: 10:34:42 PM  Count Index  Lights On: 05:09:13 AM Awakenings: 31 6.2  Time in Bed: 394.5 min. Arousals: 150 30.0  Total Sleep Time: 300.5 min. AHI (>=3% Desat and/or Ar.): 35 7.0   Sleep Efficiency: 76.2% AHI  (>=4% Desat): 10 2.0   Sleep Latency: 8.8 min. Limb Movements: - -  Wake After Sleep Onset: 85.0 min. Snore: 410 81.9  REM Latency from Sleep Onset: 86.5 min. Desaturations: 35 7.0     Minimum SpO2 TST: 90.0%    Sleep Architecture  % of Time in Bed Stages Time (mins) % Sleep Time  Wake 94.5   Stage N1 39.0 13.0%  Stage N2 232.5 77.4%  Stage N3 2.0 0.7%  REM 27.0 9.0%   Arousal Summary   NREM REM Sleep Index  Respiratory Arousals 53 - 53 10.6  PLM Arousals - - - -  Isolated Limb Movement Arousals - - - -  Snore Arousals 11 - 11 2.2  Spontaneous Arousals 82 4 86 17.2  Total 146 4 150 30.0   Limb Movement Summary   Count Index  Isolated Limb Movements - -  Periodic Limb Movements (PLMs) - -  Total Limb Movements - -  Respiratory Summary   By Sleep Stage By Body Position Total   NREM REM Supine Non-Supine   Time (min) 273.5 27.0 189.0 111.5 300.5         Obstructive Apnea - - - - -  Mixed Apnea - - - - -  Central Apnea - - - - -  Total Apneas - - - - -  Total Apnea Index - - - - -         Hypopneas (>=3% Desat and/or Ar.) 35 - 34 1 35  AHI (>=3% Desat and/or Ar.) 7.7 - 10.8 0.5 7.0         Hypopneas (>=4% Desat) 10 - 10 - 10  AHI (>=4% Desat) 2.2 - 3.2 - 2.0          RERAs 38 - 32 6 38  RERA Index 8.3 - 10.2 3.2 7.6         RDI 16.0 - 21.0 3.8 14.6    Respiratory Event Type Index  Central Apneas -  Obstructive Apneas -  Mixed Apneas -  Central Hypopneas -  Obstructive Hypopneas 7.0  Central Apnea + Hypopnea (CAHI) -  Obstructive Apnea + Hypopnea (OAHI) 7.0   Respiratory Event Durations   Apnea Hypopnea   NREM REM NREM REM  Average (seconds) - - 26.4 -  Maximum (seconds) - - 46.7 -    Oxygen Saturation Summary   Wake NREM REM TST TIB  Average SpO2 (%) 94.0% 92.9% 92.6% 92.9% 93.1%  Minimum SpO2 (%) 91.0% 90.0% 90.0% 90.0% 90.0%  Maximum SpO2 (%) 98.0% 96.0% 94.0% 96.0% 98.0%   Oxygen Saturation Distribution  Range (%) Time in range (min) Time  in range (%)  90.0 - 100.0 388.6 99.4%  80.0 - 90.0 2.3 0.6%  70.0 - 80.0 - -  60.0 - 70.0 - -  50.0 - 60.0 - -  0.0 - 50.0 - -  Time Spent <=88% SpO2  Range (%) Time in range (min) Time in range (%)  0.0 - 88.0 - -      Count Index  Desaturations 35 7.0    Cardiac Summary  Wake NREM REM Sleep Total  Average Pulse Rate (BPM) 61.2 56.2 59.1 56.5 57.6  Minimum Pulse Rate (BPM) 50.0 49.0 53.0 49.0 49.0  Maximum Pulse Rate (BPM) 88.0 84.0 78.0 84.0 88.0   Pulse Rate Distribution:  Range (bpm) Time in range (min) Time in range (%)  0.0 - 40.0 - -  40.0 - 60.0 322.0 82.3%  60.0 - 80.0 67.7 17.3%  80.0 - 100.0 1.3 0.3%  100.0 - 120.0 - -  120.0 - 140.0 - -  140.0 - 200.0 - -   EtCO2 Summary  Stage Min (mmHg) Average (mmHg) Max (mmHg)  Wake - - -  NREM(1+2+3) - - -  REM - - -   EtCO2 Distribution:  Range (mmHg) Time in range (min) Time in range (%)  20.0 - 40.0 - -  40.0 - 50.0 - -  50.0 - 100.0 - -  55.0 - 100.0 - -  Excluded data <20.0 & >65.0 395.0 100.0%     Hypnograms                         Comments  Patient was ordered as a split night study. Patient is a 63 year old male who was sent to the sleep center for snoring and OSA. Patient did not meet split night criteria per standing protocol. Patient did not have sufficient respiratory events to warrant CPAP trial per standing protocol. No oxygen was applied. No medications were reported taken. No PLMA's/PLMA's were noted. No obvious cardiac arrhythmias were noted. No restroom visits were noted. Patient was fitted with a Resmed Airfit F-30 nasal/oral full-face mask medium.

## 2023-06-18 ENCOUNTER — Telehealth: Payer: Self-pay

## 2023-06-18 DIAGNOSIS — E7800 Pure hypercholesterolemia, unspecified: Secondary | ICD-10-CM

## 2023-06-18 DIAGNOSIS — I2510 Atherosclerotic heart disease of native coronary artery without angina pectoris: Secondary | ICD-10-CM

## 2023-06-18 DIAGNOSIS — G4733 Obstructive sleep apnea (adult) (pediatric): Secondary | ICD-10-CM

## 2023-06-18 DIAGNOSIS — R002 Palpitations: Secondary | ICD-10-CM

## 2023-06-18 DIAGNOSIS — R0602 Shortness of breath: Secondary | ICD-10-CM

## 2023-06-18 DIAGNOSIS — I214 Non-ST elevation (NSTEMI) myocardial infarction: Secondary | ICD-10-CM

## 2023-06-18 DIAGNOSIS — I25118 Atherosclerotic heart disease of native coronary artery with other forms of angina pectoris: Secondary | ICD-10-CM

## 2023-06-18 DIAGNOSIS — R0683 Snoring: Secondary | ICD-10-CM

## 2023-06-18 NOTE — Telephone Encounter (Signed)
-----   Message from Nicki Guadalajara sent at 06/17/2023  6:49 PM EDT ----- Eliseo Gum, please notify pt of results and initiate Auto-PAP

## 2023-06-18 NOTE — Telephone Encounter (Signed)
 Notified patient of sleep study results and recommendations via VM, per DPR. Left callback number for any questions or concerns. Order for trial of Auto-PAP with EPR of 3 at 6 - 15 cm of water with heated humidification sent to [facility] today.

## 2023-06-19 NOTE — Telephone Encounter (Signed)
 See 3/27 encounter note.

## 2023-06-20 ENCOUNTER — Ambulatory Visit (HOSPITAL_COMMUNITY)
Admission: EM | Admit: 2023-06-20 | Discharge: 2023-06-20 | Disposition: A | Discharge status: Home / Self Care | Attending: Emergency Medicine | Admitting: Emergency Medicine

## 2023-06-20 ENCOUNTER — Other Ambulatory Visit: Payer: Self-pay

## 2023-06-20 ENCOUNTER — Encounter (HOSPITAL_COMMUNITY): Payer: Self-pay | Admitting: *Deleted

## 2023-06-20 DIAGNOSIS — J3489 Other specified disorders of nose and nasal sinuses: Secondary | ICD-10-CM | POA: Diagnosis not present

## 2023-06-20 DIAGNOSIS — J069 Acute upper respiratory infection, unspecified: Secondary | ICD-10-CM | POA: Diagnosis not present

## 2023-06-20 LAB — POC COVID19/FLU A&B COMBO
Covid Antigen, POC: NEGATIVE
Influenza A Antigen, POC: NEGATIVE
Influenza B Antigen, POC: NEGATIVE

## 2023-06-20 MED ORDER — CETIRIZINE HCL 10 MG PO TABS: 10.0000 mg | ORAL_TABLET | Freq: Every day | ORAL | 0 refills | Status: DC

## 2023-06-20 MED ORDER — FLUTICASONE PROPIONATE 50 MCG/ACT NA SUSP: 1.0000 | Freq: Every day | NASAL | 2 refills | Status: DC

## 2023-06-20 NOTE — ED Triage Notes (Signed)
 PT reports for 2 day she has had a sore throat and runny nose.

## 2023-06-20 NOTE — ED Provider Notes (Signed)
 [facility]    CSN: [id number] Arrival date & time: 06/20/23  1506      History   Chief Complaint Chief Complaint  Patient presents with   Sore Throat   Cough    HPI Todd [name] is a 63 y.o. male.   Patient presents to clinic over concerns of sore throat, rhinorrhea, generalized bodyaches and fatigue that started yesterday.  Into today the fatigue has gotten worse but the runny nose and sore throat have improved.  Denies any fevers.  Denies recent sick contacts.  Mild cough, denies wheezing or shortness of breath.  Without vomiting or diarrhea.  The history is provided by the patient and medical records.  Sore Throat  Cough   Past Medical History:  Diagnosis Date   CAD (coronary artery disease)    Complicated migraine    Deafness in left ear    Diverticulosis    External hemorrhoids    Headache    Hypertension    Internal hemorrhoids    Palpitations    Stroke [facility])    TIA (transient ischemic attack)    Tubular adenoma of colon    Vision abnormalities     Patient Active Problem List   Diagnosis Date Noted   Tonsillar abscess 02/24/2023   Sepsis (HCC) 02/24/2023   Hyperbilirubinemia 02/24/2023   Essential hypertension 02/24/2023   Hearing loss of left ear 06/02/2022   Chronic anticoagulation 08/26/2021   Internal hemorrhoid, bleeding 08/26/2021   Prolapsed internal hemorrhoids, grade 3 08/26/2021   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 coronary artery disease 08/26/2021   NSTEMI (non-ST elevated myocardial infarction) (HCC) 07/20/2021   CAD (coronary artery disease) 07/18/2021   Hyperlipidemia 07/18/2021   Unstable angina (HCC)    Non-recurrent acute serous otitis media of right ear 10/14/2018   Shortness of breath 11/12/2016   Chest pain 11/12/2016   Heart palpitations 10/20/2013   Situational anxiety 10/20/2013   Migraine with visual aura 08/24/2013   TIA (transient ischemic attack) 08/23/2013    Blurred vision 08/23/2013   Anomia 08/23/2013    Past Surgical History:  Procedure Laterality Date   APPENDECTOMY  12/01/11   lap appy   CORONARY STENT INTERVENTION N/A 07/17/2021   Procedure: CORONARY STENT INTERVENTION;  Surgeon: Kathleene Hazel, MD;  Location: [facility];  Service: Cardiovascular;  Laterality: N/A;   LAPAROSCOPIC APPENDECTOMY  12/02/2011   Procedure: APPENDECTOMY LAPAROSCOPIC;  Surgeon: Lodema Pilot, DO;  Location: [facility];  Service: General;  Laterality: N/A;   LEFT HEART CATH AND CORONARY ANGIOGRAPHY N/A 07/17/2021   Procedure: LEFT HEART CATH AND CORONARY ANGIOGRAPHY;  Surgeon: Kathleene Hazel, MD;  Location: [facility];  Service: Cardiovascular;  Laterality: N/A;       Home Medications    Prior to Admission medications   Medication Sig Start Date End Date Taking? Authorizing Provider  cetirizine (ZYRTEC ALLERGY) 10 MG tablet Take 1 tablet (10 mg total) by mouth daily. 06/20/23  Yes Rinaldo Ratel, [place] N, FNP  fluticasone [facility]) 50 MCG/ACT nasal spray Place 1 spray into both nostrils daily. 06/20/23  Yes Rinaldo Ratel, [place] N, FNP  ALPRAZolam Prudy Feeler) 0.25 MG tablet Take 1-2 tablets (0.25-0.5 mg total) by mouth 2 (two) times daily as needed for anxiety. 06/02/23   Shade Flood, MD  aspirin EC 81 MG tablet Take 81 mg by mouth daily. Swallow whole.    [provider]  metoprolol succinate (TOPROL XL) 25 MG 24 hr  tablet Take 1 tablet (25 mg total) by mouth daily. 07/14/22   Hilty, Lisette Abu, MD  omeprazole (PRILOSEC) 20 MG capsule Take 1 capsule (20 mg total) by mouth daily. Patient not taking: Reported on 06/11/2023 05/21/23   Shade Flood, MD  pantoprazole (PROTONIX) 40 MG tablet Take 1 tablet (40 mg total) by mouth 2 (two) times daily. 06/11/23 06/10/24  Jenel Lucks, MD  rosuvastatin (CRESTOR) 20 MG tablet TAKE 1 TABLET BY MOUTH EVERY DAY 01/05/23   Hilty, Lisette Abu, MD    Family History Family History  Problem  Relation Age of Onset   Hypertension Mother    Hypertension Father    Anemia Neg Hx    Arrhythmia Neg Hx    Asthma Neg Hx    Clotting disorder Neg Hx    Fainting Neg Hx    Heart attack Neg Hx    Heart disease Neg Hx    Heart failure Neg Hx    Hyperlipidemia Neg Hx    Colon cancer Neg Hx    Esophageal cancer Neg Hx    Stomach cancer Neg Hx    Rectal cancer Neg Hx    Colon polyps Neg Hx     Social History Social History   Tobacco Use   Smoking status: Never   Smokeless tobacco: Never  Vaping Use   Vaping status: Never Used  Substance Use Topics   Alcohol use: No   Drug use: No     Allergies   Wellbutrin [bupropion hcl]   Review of Systems Review of Systems  Per HPI  Physical Exam Triage Vital Signs ED Triage Vitals  Encounter Vitals Group     BP 06/20/23 1558 121/81     Systolic BP Percentile --      Diastolic BP Percentile --      Pulse Rate 06/20/23 1558 76     Resp 06/20/23 1558 18     Temp 06/20/23 1558 98.5 F (36.9 C)     Temp src --      SpO2 06/20/23 1558 95 %     Weight --      Height --      Head Circumference --      Peak Flow --      Pain Score 06/20/23 1556 3     Pain Loc --      Pain Education --      Exclude from Growth Chart --    No data found.  Updated Vital Signs BP 121/81   Pulse 76   Temp 98.5 F (36.9 C)   Resp 18   SpO2 95%   Visual Acuity Right Eye Distance:   Left Eye Distance:   Bilateral Distance:    Right Eye Near:   Left Eye Near:    Bilateral Near:     Physical Exam Vitals and nursing note reviewed.  Constitutional:      Appearance: Normal appearance.  HENT:     Head: Normocephalic and atraumatic.     Right Ear: External ear normal.     Left Ear: External ear normal.     Nose: Congestion and rhinorrhea present.     Mouth/Throat:     Mouth: Mucous membranes are moist.     Pharynx: Posterior oropharyngeal erythema present.     Tonsils: No tonsillar exudate.  Eyes:     Conjunctiva/sclera:  Conjunctivae normal.  Cardiovascular:     Rate and Rhythm: Normal rate and regular rhythm.  Heart sounds: Normal heart sounds. No murmur heard. Pulmonary:     Effort: Pulmonary effort is normal. No respiratory distress.     Breath sounds: Normal breath sounds. No wheezing.  Neurological:     General: No focal deficit present.     Mental Status: He is alert.  Psychiatric:        Mood and Affect: Mood normal.      UC Treatments / Results  Labs (all labs ordered are listed, but only abnormal results are displayed) Labs Reviewed  POC COVID19/FLU A&B COMBO    EKG   Radiology No results found.  Procedures Procedures (including critical care time)  Medications Ordered in UC Medications - No data to display  Initial Impression / Assessment and Plan / UC Course  I have reviewed the triage vital signs and the nursing notes.  Pertinent labs & imaging results that were available during my care of the patient were reviewed by me and considered in my medical decision making (see chart for details).  Vitals and triage reviewed, patient is hemodynamically stable.  Lungs are vesicular, heart with regular rate and rhythm.  Congestion, rhinorrhea and postnasal drip present on physical exam.  Symptoms consistent with viral URI, COVID and flu testing negative.  Symptomatic management for viral URI discussed.    Patient concerned over allergies, advised daily antihistamine and nasal spray.  Plan of care, follow-up care return precautions given, no questions at this time.     Final Clinical Impressions(s) / UC Diagnoses   Final diagnoses:  Viral URI with cough  Rhinorrhea     Discharge Instructions      COVID and flu testing are negative, you most likely have a different viral illness.  Alternate between 600 mg of ibuprofen and 500 mg of Tylenol every 4-6 hours for any fever, body aches or chills.  Warm saline gargles, tea with honey and sleeping with a Bufford Buttner fire may help with  your sore throat.  Flonase can help with the runny nose is related to allergies.  Zyrtec can help for systemic allergy symptoms.  With your viral illness symptoms should improve over the next 5 days or so.  If no improvement or any changes follow-up with your primary care provider for reevaluation.      ED Prescriptions     Medication Sig Dispense Auth. Provider   fluticasone (FLONASE) 50 MCG/ACT nasal spray Place 1 spray into both nostrils daily. 9.9 mL Phillipa Morden, [place] N, FNP   cetirizine (ZYRTEC ALLERGY) 10 MG tablet Take 1 tablet (10 mg total) by mouth daily. 30 tablet Harvin Konicek, [place] N, [place]      PDMP not reviewed this encounter.   Omunique Pederson, [place] N, [place] 06/20/23 1739

## 2023-06-20 NOTE — Discharge Instructions (Addendum)
 COVID and flu testing are negative, you most likely have a different viral illness.  Alternate between 600 mg of ibuprofen and 500 mg of Tylenol every 4-6 hours for any fever, body aches or chills.  Warm saline gargles, tea with honey and sleeping with a Bufford Buttner fire may help with your sore throat.  Flonase can help with the runny nose is related to allergies.  Zyrtec can help for systemic allergy symptoms.  With your viral illness symptoms should improve over the next 5 days or so.  If no improvement or any changes follow-up with your primary care provider for reevaluation.

## 2023-06-22 ENCOUNTER — Encounter: Payer: Self-pay | Admitting: Internal Medicine

## 2023-06-22 ENCOUNTER — Encounter: Payer: Self-pay | Admitting: Gastroenterology

## 2023-06-22 ENCOUNTER — Ambulatory Visit: Payer: Commercial Managed Care - HMO | Attending: Internal Medicine | Admitting: Internal Medicine

## 2023-06-22 VITALS — BP 108/66 | HR 69 | Ht 66.0 in | Wt 172.2 lb

## 2023-06-22 DIAGNOSIS — G4733 Obstructive sleep apnea (adult) (pediatric): Secondary | ICD-10-CM | POA: Diagnosis not present

## 2023-06-22 DIAGNOSIS — R002 Palpitations: Secondary | ICD-10-CM

## 2023-06-22 DIAGNOSIS — I2510 Atherosclerotic heart disease of native coronary artery without angina pectoris: Secondary | ICD-10-CM

## 2023-06-22 DIAGNOSIS — E785 Hyperlipidemia, unspecified: Secondary | ICD-10-CM | POA: Diagnosis not present

## 2023-06-22 NOTE — Patient Instructions (Signed)
 Medication Instructions:  NO CHANGES  *If you need a refill on your cardiac medications before your next appointment, please call your pharmacy*  Lab Work: NMR lipoprofile and LPa today -- cholesterol testing   If you have labs (blood work) drawn today and your tests are completely normal, you will receive your results only by: MyChart Message (if you have MyChart) OR A paper copy in the mail If you have any lab test that is abnormal or we need to change your treatment, we will call you to review the results.   Follow-Up: At [facility], you and your health needs are our priority.  As part of our continuing mission to provide you with exceptional heart care, our providers are all part of one team.  This team includes your primary Cardiologist (physician) and Advanced Practice Providers or APPs (Physician Assistants and Nurse Practitioners) who all work together to provide you with the care you need, when you need it.  Your next appointment:   12 months with Dr. Rennis Golden or NP/PA  We recommend signing up for the patient portal called "MyChart".  Sign up information is provided on this After Visit Summary.  MyChart is used to connect with patients for Virtual Visits (Telemedicine).  Patients are able to view lab/test results, encounter notes, upcoming appointments, etc.  Non-urgent messages can be sent to your provider as well.   To learn more about what you can do with MyChart, go to [email or web address].     1st Floor: - Lobby - Registration  - Pharmacy  - Lab - Cafe  2nd Floor: - PV Lab - Diagnostic Testing (echo, CT, nuclear med)  3rd Floor: - Vacant  4th Floor: - TCTS (cardiothoracic surgery) - AFib Clinic - Structural Heart Clinic - Vascular Surgery  - Vascular Ultrasound  5th Floor: - HeartCare Cardiology (general and EP) - Clinical Pharmacy for coumadin, hypertension, lipid, weight-loss medications, and med management appointments    Valet parking  services will be available as well.

## 2023-06-22 NOTE — Progress Notes (Signed)
 OFFICE NOTE  Chief Complaint:  Follow-up DOE, palpitations  Primary Care Physician: Shade Flood, MD  HPI:  Todd [name] is a 63 year old male who was previously seen at [facility] heart and vascular by Dr. Lynnea Ferrier. He underwent a stress test, echocardiogram and monitor which were unrevealing. He was told he was well and did not need to follow back up with [place]. He recently had an episode where he had some visual abnormalities including seeing white spots. He presented to the emergency room and there was concern for TIA or stroke. Workup was generally unremarkable except for some cerebral atherosclerosis. It was also considered that he might have had a migraine although he had no headache and has no history of headaches. He currently denies any chest pain although does get some mild shortness of breath with exertion. 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 He does have a family history of heart disease in his father who apparently had an enlarged heart and fluid around the heart. He also has a history of anxiety and was briefly a medication for anxiety and depression in the past. He takes Lipitor for elevated LDL cholesterol recently which was 130.  I saw Todd [name] back in the office today. He denies any worsening palpitations. He was in the emergency room apparently this morning with blurred vision and seeing spots. He also had a headache on the right side of his head. He left apparently before being fully evaluated. He's had episodes like this before. There is question is whether this could be complicated migraine or TIA. He denies any chest pain or worsening shortness of breath.  11/12/2016  Todd [name] was seen today in follow-up. He was recently seen in the ER for chest discomfort. He says he occasionally gets some palpitations which are short-lived and chest discomfort. He's also been having visual changes. That is  typically followed by headache. He underwent neurology evaluation in the past for this as previously described in my notes. MRI showed some cerebral atherosclerosis but was felt to have no evidence of stroke. It was thought that his symptoms are related to migraine. He reports chronic stress and running his own business. She's not currently on medications. He was prescribed some medications for migraines ever since his symptoms improved he stopped taking them and did not follow-up with the neurologist. With regard to the chest pain it is mostly sharp, not necessarily associated with exertion or relieved by rest. Sometimes associated with palpitations.   01/04/2021  Todd [name] not is seen today in follow-up.  He is considered a new patient today since he has not been seen in the past 3 years.  He is again having some palpitations.  They occur fairly sporadically but sometimes can be more intense.  We talked a lot about his diet.  He said he does not drink often but he drinks green tea.  There is between 30 to 50 mg of caffeine and every cup of green tea and therefore if he could cut that back or switch to decaffeinated it may be helpful.  He may ultimately need low-dose beta-blocker.  He is also been experiencing shortness of breath per tickly walking upstairs.  This is worsened recently.  He has no history of coronary disease although lipids in August showed significant elevation.  He says his diet is pretty unregulated, meaning he eats what ever he can.  He is not very active  is he drives an Pharmac[employer], community and does not exercise much.  He recently started on 5 mg rosuvastatin for this.  Total cholesterol was 238, HDL 46, triglycerides 123 and LDL 167.  07/31/2021  Todd [name] returns today for follow-up.  He was recently seen a number of times by Gillian Shields, NP for chest pressure.  Ultimately he underwent coronary CT angiography which showed a calcium score of 370, 45th percentile for age and sex matched control,  however he was noted to have moderate stenosis in the mid LAD.  The study was sent for [facility] which suggested flow-limiting stenosis in the mid to distal LAD.  Subsequently he was sent for cardiac catheterization and was found to have an 80% focal stenosis of the mid LAD.  He also had 30% proximal RCA and 30% first marginal stenoses.  He underwent a drug-eluting stent to the LAD which reduced the stenosis to 0.  He was advised to stay on aspirin and Plavix for minimum of 6 months and his rosuvastatin was increased to 20 mg daily.  Subsequently he has had numerous emergency department visits.  He reports his chest pressure was resolved and although he had a little bit of shortness of breath when leaving the hospital that improved as well after a few days.  He has subsequently had numerous episodes of sharp chest pain.  This is described as stabbing and along the left sternal wall.  He also has some discomfort in over the left trapezius area.  These episodes occur on a daily basis, sometimes wake him up at night or in the morning but occurred for example today in the afternoon just prior to his visit.  He is noted occasional low blood pressure with systolic blood pressures in the 90s however was seen this morning by pulmonary and blood pressure was normal.  Blood pressure today is 110/65.  He is on a low-dose of Toprol but no other medications for blood pressure.  11/04/2021  Todd [name] returns today for follow-up.  He reports he is actually doing quite a bit better.  He gets very infrequent palpitations that only last for a few seconds.  He shortness of breath is improved and his chest pain is gone.  He has had some hemorrhoidal bleeding.  He is change his diet notes better more regular bowel movements with less constipation.  He has discussed with GI about possible surgery however they are waiting on that.  From a cardiac standpoint he would be able to stop the clopidogrel for 5 days as of October 2023.  Preferably,  would like to restart it and have him continue the clopidogrel and aspirin until 1 year or April 2024.  He did have lipids in May which showed an elevated LDL of 135, however he had taken a holiday off the statin.  He reported that that did not actually change his symptoms so he started taking the medicine again.  He will need repeat lipid testing in a few months.  07/14/2022  Todd [name] is seen today in follow-up.  He denies any recurrent angina.  He has had some palpitations but he says these have been going on for years.  Cholesterol is climbing somewhat with LDL recently at 90.  He understands that his target is less than 70 and he will need to continue to work on that.  He is now a year out from stenting and from my standpoint could stop his clopidogrel.  06/22/2023  Todd [name] is seen today in  follow-up.  He reports still some dyspnea which he says is actually little better since he stopped exercising.  He did have pulmonary function testing.  His initial FEV1 was 42% however apparently he said he did not make a good respiratory effort.  He had repeat testing recently which showed an FEV1 of 104%.  In fact most of his repeat lung function test look normal.  He does have an upper respiratory infection today but is getting better.  He reports his palpitations have resolved.  An echo in 2023 which showed EF 65 to 70%.  He does have coronary disease both by CT coronary angiography and cardiac catheterization in 2023 at which time he was found to have mid LAD stenosis which was treated with a drug-eluting stent.  He has not had any anginal symptoms since then.  PMHx:  Past Medical History:  Diagnosis Date   CAD (coronary artery disease)    Complicated migraine    Deafness in left ear    Diverticulosis    External hemorrhoids    Headache    Hypertension    Internal hemorrhoids    Palpitations    Stroke [facility])    TIA (transient ischemic attack)    Tubular adenoma of colon    Vision abnormalities      Past Surgical History:  Procedure Laterality Date   APPENDECTOMY  12/01/11   lap appy   CORONARY STENT INTERVENTION N/A 07/17/2021   Procedure: CORONARY STENT INTERVENTION;  Surgeon: Kathleene Hazel, MD;  Location: [facility] INVASIVE CV LAB;  Service: Cardiovascular;  Laterality: N/A;   LAPAROSCOPIC APPENDECTOMY  12/02/2011   Procedure: APPENDECTOMY LAPAROSCOPIC;  Surgeon: Lodema Pilot, DO;  Location: [facility] ORS;  Service: General;  Laterality: N/A;   LEFT HEART CATH AND CORONARY ANGIOGRAPHY N/A 07/17/2021   Procedure: LEFT HEART CATH AND CORONARY ANGIOGRAPHY;  Surgeon: Kathleene Hazel, MD;  Location: [facility] INVASIVE CV LAB;  Service: Cardiovascular;  Laterality: N/A;    FAMHx:  Family History  Problem Relation Age of Onset   Hypertension Mother    Hypertension Father    Anemia Neg Hx    Arrhythmia Neg Hx    Asthma Neg Hx    Clotting disorder Neg Hx    Fainting Neg Hx    Heart attack Neg Hx    Heart disease Neg Hx    Heart failure Neg Hx    Hyperlipidemia Neg Hx    Colon cancer Neg Hx    Esophageal cancer Neg Hx    Stomach cancer Neg Hx    Rectal cancer Neg Hx    Colon polyps Neg Hx     SOCHx:   reports that he has never smoked. He has never used smokeless tobacco. He reports that he does not drink alcohol and does not use drugs.  ALLERGIES:  Allergies  Allergen Reactions   Wellbutrin [Bupropion Hcl] Itching and Rash    ROS: Pertinent items noted in HPI and remainder of comprehensive ROS otherwise negative.  HOME MEDS: Current Outpatient Medications  Medication Sig Dispense Refill   aspirin EC 81 MG tablet Take 81 mg by mouth daily. Swallow whole.     metoprolol succinate (TOPROL XL) 25 MG 24 hr tablet Take 1 tablet (25 mg total) by mouth daily. 90 tablet 3   pantoprazole (PROTONIX) 40 MG tablet Take 1 tablet (40 mg total) by mouth 2 (two) times daily. 60 tablet 1   rosuvastatin (CRESTOR) 20 MG tablet TAKE 1 TABLET BY MOUTH EVERY DAY 30  tablet 5   ALPRAZolam  (XANAX) 0.25 MG tablet Take 1-2 tablets (0.25-0.5 mg total) by mouth 2 (two) times daily as needed for anxiety. (Patient not taking: Reported on 06/22/2023) 20 tablet 0   No current facility-administered medications for this visit.    LABS/IMAGING: Results for orders placed or performed during the hospital encounter of 06/20/23 (from the past 48 hours)  POC Covid19/Flu A&B Antigen     Status: None   Collection Time: 06/20/23  4:28 PM  Result Value Ref Range   Influenza A Antigen, POC Negative Negative   Influenza B Antigen, POC Negative Negative   Covid Antigen, POC Negative Negative   No results found.  VITALS: BP 108/66 (BP Location: Left Arm, Patient Position: Sitting, Cuff Size: Normal)   Pulse 69   Ht 5\' 6"  (1.676 m)   Wt 172 lb 3.2 oz (78.1 kg)   SpO2 99%   BMI 27.79 kg/m   EXAM: General appearance: alert and no distress Neck: no carotid bruit and no JVD Lungs: clear to auscultation bilaterally Heart: regular rate and rhythm, S1, S2 normal, no murmur, click, rub or gallop Abdomen: soft, non-tender; bowel sounds normal; no masses,  no organomegaly Extremities: extremities normal, atraumatic, no cyanosis or edema Pulses: 2+ and symmetric Skin: Skin color, texture, turgor normal. No rashes or lesions Neurologic: Grossly normal Psych: Mildly anxious  EKG: N/A  ASSESSMENT: Coronary artery disease status post PCI to the mid LAD with DES (06/2021) Abnormal coronary CT with CAC score of 370, 45th percentile for age and sex matched control DOE Palpitations Visual changes with headache - suspect migraine Mild dyspnea with exertion Atherosclerosis/dyslipidemia Anxiety OSA   PLAN: 1.   Mr. Anglin has had some recent shortness of breath.  No chest pain.  He did have a stent in 2023.  He is on appropriate medical therapy including aspirin and statin.  His LDL was not at target last year at 90.  He is due for repeat lipids.  Will check an NMR and LP(a) today.  I may need to  further titrate his medicine or add additional therapy.  Blood pressure is normal.  Recent PFTs were abnormal however repeat appears to be normal.  He also had a CT scan of the chest which was unremarkable.  He reports his palpitations have largely resolved.  He was recently diagnosed with obstructive sleep apnea.  I have encouraged use of CPAP although it was just mild.  Plan follow-up with [place] annually or sooner as necessary.  Chrystie Nose, MD, [facility], FACP  [place]  Medical Director of the [facility] Lip[facility]  Cardiovascular Risk Reduction Clinic Diplomate of the American Board of Clinical Lipidology Attending Cardiologist  Direct Dial: [phone]  Fax: [phone]  Website:  [email or web address] 06/22/2023, 8:52 AM

## 2023-06-22 NOTE — Progress Notes (Signed)
 Addendum: Reviewed and agree with assessment and management plan. Asha Grumbine, Carie Caddy, MD

## 2023-06-22 NOTE — Progress Notes (Signed)
 Mr. Sherrin,  The biopsies taken from your stomach were notable for mild reactive gastropathy which is a common finding and often related to use of certain medications (usually NSAIDs), but there was no evidence of Helicobacter pylori infection. This common finding is not felt to necessarily be a cause of any particular symptom and there is no specific treatment or further evaluation recommended. Please take the pantoprazole twice daily for a month and follow up with [place] in the office to discuss further management of your chronic bloating.

## 2023-06-23 LAB — NMR, LIPOPROFILE
Cholesterol, Total: 148 mg/dL (ref 100–199)
HDL Particle Number: 35.7 umol/L (ref >=30.5)
HDL-C: 45 mg/dL (ref >39)
LDL Particle Number: 1054 nmol/L — ABNORMAL HIGH (ref <1000)
LDL Size: 20.4 nm — ABNORMAL LOW (ref >20.5)
LDL-C (NIH Calc): 78 mg/dL (ref 0–99)
LP-IR Score: 54 — ABNORMAL HIGH (ref <=45)
Small LDL Particle Number: 586 nmol/L — ABNORMAL HIGH (ref <=527)
Triglycerides: 146 mg/dL (ref 0–149)

## 2023-06-23 LAB — LIPOPROTEIN A (LPA): Lipoprotein (a): 193.9 nmol/L — ABNORMAL HIGH (ref <75.0)

## 2023-06-23 NOTE — Progress Notes (Unsigned)
 06/24/2023 Todd [name] [id number] Aug 15, 1960  Referring provider: Shade Flood, MD Primary GI doctor: Dr. Rhea Belton  ASSESSMENT AND PLAN:   Bloating with associated SOB x 4 years with GERD, no dysphagia, no N/V, no untentional weight loss CT of the abdomen and pelvis  February 2024 done with contrast for complaints of abdominal pain and this was negative. 10/01/2022 for abdominal ultrasound for bloating was unremarkable 06/11/23 EGD negative H pylori gastritis -will proceed with SIBO testing -Will get GES study with weight loss, bloating, early satiety, diet given -Diaphramatic breathing information given to patient, possible more air hunger - continue follow up with pulmonary  Constipation - Increase fiber/ water intake, decrease caffeine, increase activity level. -Will add on Miralax daily, consider linzess  SOB Echo EF 65-70 2024 Not exertional, no chest pain Has seen several pulmonary doctors, has mild sleep apnea, going to get on CPAP Able to complete walk test without issues  Screening colonoscopy colonoscopy in 06/2019 he did not have any recurrent polyps, was found to have grade 3 internal hemorrhoids, and also external hemorrhoids as well as a few diverticuli in the entire colon.  CAD s/p DES 2023 Echo EF 65-70 2024  Gilbert's syndrome Indirect elevated reassured    Patient Care Team: Shade Flood, MD as PCP - General (Family Medicine) Rennis Golden Lisette Abu, MD as PCP - Cardiology (Cardiology)  HISTORY OF PRESENT ILLNESS: 63 y.o. male with a past medical history of personal history of adenomatous polyps, internal/external hemorrhoids, coronary artery disease he is status post MI and had a drug-eluting stent in 2023, also prior history of probable TIA, Most recent echo in 2023 EF 65 to 70%, constipation and others listed below presents for evaluation of bloating/GERD.   colonoscopy in 06/2019 he did not have any recurrent polyps, was found to have  grade 3 internal hemorrhoids, and also external hemorrhoids as well as a few diverticuli in the entire colon. CT of the abdomen and pelvis  February 2024 done with contrast for complaints of abdominal pain and this was negative. 10/01/2022 for abdominal ultrasound for bloating was unremarkable 06/11/23 EGD- The examined portions of the nasopharynx, oropharynx and larynx were normal. - Z- line irregular. - Gastritis. Biopsied. - 3 cm hiatal hernia. - Erythematous duodenopathy. H pylori neg gastritis, negative dysplasia  Discussed the use of AI scribe software for clinical note transcription with the patient, who gave verbal consent to proceed.  History of Present Illness   Todd [name] is a 63 year old male who presents with shortness of breath and bloating.  He experiences intermittent shortness of breath and bloating, which sometimes occur while eating, necessitating deep breaths even with food in his mouth. No trouble swallowing, nausea, vomiting, or weight loss. These symptoms can also occur at other times.  An endoscopy performed three weeks ago revealed general inflammation consistent with gastritis, negative for H. pylori and cancer cells, and identified a 3 cm hiatal hernia. He has been taking Prilosec, initially increased to 40 mg twice daily, but he has been taking it once a week instead. Some improvement in symptoms, particularly a reduction in 'clotting', but still experiences bloating and discomfort. He also experiences a sensation of fullness quickly after eating, despite eating less.  A sleep study revealed mild sleep apnea, and a machine is being ordered. He wakes up with difficulty breathing, attributing it to anxiety occurring after the breathing difficulty, not before. No heart-related issues as a cause of his symptoms.  He  has experienced weight loss from 181 lbs to 172 lbs, attributed to intentional dietary changes. He feels full quickly and has reduced his food intake. No  significant changes in bowel habits, although he mentions going to the bathroom every three days.     He  reports that he has never smoked. He has never used smokeless tobacco. He reports that he does not drink alcohol and does not use drugs.  RELEVANT GI HISTORY, IMAGING AND LABS: Results   DIAGNOSTIC Endoscopy: General inflammation, gastritis, negative for H. pylori, negative for malignant cells, 3 cm hiatal hernia (06/03/2023) Sleep study: Mild obstructive sleep apnea      CBC    Component Value Date/Time   WBC 8.0 05/29/2023 2146   RBC 5.09 05/29/2023 2146   HGB 15.4 05/29/2023 2146   HGB 15.4 09/19/2019 0814   HCT 44.3 05/29/2023 2146   HCT 44.7 09/19/2019 0814   PLT 237 05/29/2023 2146   PLT 253 09/19/2019 0814   MCV 87.0 05/29/2023 2146   MCV 90.1 10/03/2019 1741   MCV 89 09/19/2019 0814   MCH 30.3 05/29/2023 2146   MCHC 34.8 05/29/2023 2146   RDW 12.4 05/29/2023 2146   RDW 12.6 09/19/2019 0814   LYMPHSABS 2.0 05/29/2023 2146   MONOABS 0.6 05/29/2023 2146   EOSABS 0.1 05/29/2023 2146   BASOSABS 0.1 05/29/2023 2146   Recent Labs    07/22/22 0156 10/01/22 0016 02/24/23 0028 02/25/23 0451 03/14/23 1255 05/21/23 1412 05/29/23 2146  HGB 14.5 14.7 15.2 13.3 15.7 15.5 15.4    CMP     Component Value Date/Time   NA 140 05/29/2023 2146   NA 140 10/23/2022 1050   K 4.1 05/29/2023 2146   CL 107 05/29/2023 2146   CO2 22 05/29/2023 2146   GLUCOSE 112 (H) 05/29/2023 2146   BUN 18 05/29/2023 2146   BUN 13 10/23/2022 1050   CREATININE 1.41 (H) 05/29/2023 2146   CALCIUM 9.1 05/29/2023 2146   PROT 6.6 05/29/2023 2146   PROT 7.0 09/19/2019 0814   ALBUMIN 3.9 05/29/2023 2146   ALBUMIN 4.2 09/19/2019 0814   AST 31 05/29/2023 2146   ALT 28 05/29/2023 2146   ALKPHOS 44 05/29/2023 2146   BILITOT 2.6 (H) 05/29/2023 2146   BILITOT 1.1 09/19/2019 0814   GFRNONAA 57 (L) 05/29/2023 2146   GFRAA 101 09/19/2019 0814      Latest Ref Rng & Units 05/29/2023    9:46 PM  03/23/2023    1:30 PM 03/14/2023   12:55 PM  Hepatic Function  Total Protein 6.5 - 8.1 g/dL 6.6   6.8   Albumin 3.5 - 5.0 g/dL 3.9   3.8   AST 15 - 41 U/L 31   26   ALT 0 - 44 U/L 28   32   Alk Phosphatase 38 - 126 U/L 44   51   Total Bilirubin 0.0 - 1.2 mg/dL 2.6  1.5  2.1   Bilirubin, Direct 0.0 - 0.2 mg/dL  0.2        Current Medications:    Current Outpatient Medications (Cardiovascular):    metoprolol succinate (TOPROL XL) 25 MG 24 hr tablet, Take 1 tablet (25 mg total) by mouth daily.   rosuvastatin (CRESTOR) 20 MG tablet, TAKE 1 TABLET BY MOUTH EVERY DAY   Current Outpatient Medications (Analgesics):    aspirin EC 81 MG tablet, Take 81 mg by mouth daily. Swallow whole.   Current Outpatient Medications (Other):    pantoprazole (PROTONIX) 40 MG  tablet, Take 1 tablet (40 mg total) by mouth 2 (two) times daily.   ALPRAZolam (XANAX) 0.25 MG tablet, Take 1-2 tablets (0.25-0.5 mg total) by mouth 2 (two) times daily as needed for anxiety. (Patient not taking: Reported on 06/24/2023)  Medical History:  Past Medical History:  Diagnosis Date   CAD (coronary artery disease)    Complicated migraine    Deafness in left ear    Diverticulosis    External hemorrhoids    Headache    Hypertension    Internal hemorrhoids    Palpitations    Stroke (HCC)    TIA (transient ischemic attack)    Tubular adenoma of colon    Vision abnormalities    Allergies:  Allergies  Allergen Reactions   Wellbutrin [Bupropion Hcl] Itching and Rash     Surgical History:  He  has a past surgical history that includes laparoscopic appendectomy (12/02/2011); Appendectomy (12/01/11); LEFT HEART CATH AND CORONARY ANGIOGRAPHY (N/A, 07/17/2021); and CORONARY STENT INTERVENTION (N/A, 07/17/2021). Family History:  His family history includes Hypertension in his father and mother.  REVIEW OF SYSTEMS  : All other systems reviewed and negative except where noted in the History of Present Illness.  PHYSICAL  EXAM: BP 130/82 (BP Location: Right Arm, Patient Position: Sitting, Cuff Size: Normal)   Pulse 65   Ht 5\' 6"  (1.676 m)   Wt 174 lb 9.6 oz (79.2 kg)   BMI 28.18 kg/m  GENERAL APPEARANCE: Well nourished, in no apparent distress. HEENT: No cervical lymphadenopathy, thyroid normal, sclerae anicteric, conjunctiva pink. RESPIRATORY: Respiratory effort normal, breath sounds clear bilaterally without rales, rhonchi, or wheezing. CARDIO: Regular rate and rhythm with no murmurs, rubs, or gallops, peripheral pulses intact. ABDOMEN: Soft, non-distended, active bowel sounds in all four quadrants, mild epigastric and left lower quadrant discomfort on palpation, no rebound, no mass appreciated. RECTAL: Declines. MUSCULOSKELETAL: Full range of motion, normal gait, without edema. SKIN: Dry, intact without rashes or lesions. No jaundice. NEURO: Alert, oriented, no focal deficits. PSYCH: Cooperative, normal mood and affect. NECK: Neck supple, no lymphadenopathy, thyroid normal. EXTREMITIES: No edema in lower extremities.  Doree Albee, PA-C 2:20 PM

## 2023-06-24 ENCOUNTER — Telehealth: Payer: Self-pay | Admitting: Internal Medicine

## 2023-06-24 ENCOUNTER — Ambulatory Visit: Admitting: Physician Assistant

## 2023-06-24 ENCOUNTER — Encounter: Payer: Self-pay | Admitting: Physician Assistant

## 2023-06-24 VITALS — BP 130/82 | HR 65 | Ht 66.0 in | Wt 174.6 lb

## 2023-06-24 DIAGNOSIS — R0602 Shortness of breath: Secondary | ICD-10-CM | POA: Diagnosis not present

## 2023-06-24 DIAGNOSIS — K219 Gastro-esophageal reflux disease without esophagitis: Secondary | ICD-10-CM | POA: Diagnosis not present

## 2023-06-24 DIAGNOSIS — E785 Hyperlipidemia, unspecified: Secondary | ICD-10-CM

## 2023-06-24 DIAGNOSIS — R6881 Early satiety: Secondary | ICD-10-CM

## 2023-06-24 DIAGNOSIS — R140 Abdominal distension (gaseous): Secondary | ICD-10-CM

## 2023-06-24 DIAGNOSIS — R634 Abnormal weight loss: Secondary | ICD-10-CM

## 2023-06-24 DIAGNOSIS — K5900 Constipation, unspecified: Secondary | ICD-10-CM

## 2023-06-24 DIAGNOSIS — R143 Flatulence: Secondary | ICD-10-CM

## 2023-06-24 NOTE — Patient Instructions (Signed)
 You will be contacted by [facility] in the next 2 days to arrange a gastric emptying scan.  The number on your caller ID will be [phone], please answer when they call.  If you have not heard from them in 2 days please call [phone] to schedule.    You have been given a testing kit to check for small intestine bacterial overgrowth (SIBO) which is completed by a company named [employer]. Make sure to return your test in the mail using the return mailing label given to you along with the kit. The test order, your demographic and insurance information have all already been sent to the company. [facility] will collect an upfront charge of $99.74 for commercial insurance plans and $209.74 if you are paying cash. Make sure to discuss with [facility] PRIOR to having the test to see if they have gotten information from your insurance company as to how much your testing will cost out of pocket, if any. Please contact [facility] at phone number [phone] to get instructions regarding how to perform the test as our office is unable to give specific testing instructions.  TRY DIAPHRAGMATIC BREATHING  Small intestinal bacterial overgrowth (SIBO) occurs when there is an abnormal increase in the overall bacterial population in the small intestine -- particularly types of bacteria not commonly found in that part of the digestive tract. Small intestinal bacterial overgrowth (SIBO) commonly results when a circumstance -- such as surgery or disease -- slows the passage of food and waste products in the digestive tract, creating a breeding ground for bacteria.  Signs and symptoms of SIBO often include: Loss of appetite Abdominal pain Nausea Bloating An uncomfortable feeling of fullness after eating Diarrhea or constipation, depending on the type of gas produced  What foods trigger SIBO? While foods aren't the original cause of SIBO, certain foods do encourage the  overgrowth of the wrong bacteria in your small intestine. If you're feeding them their favorite foods, they're going to grow more, and that will trigger more of your SIBO symptoms. By the same token, you can help reduce the overgrowth by starving the problematic bacteria of their favorite foods. This strategy has led to a number of proposed SIBO eating plans. The plans vary, and so do individual results. But in general, they tend to recommend limiting carbohydrates.  These include: Sugars and sweeteners. Fruits and starchy vegetables. Dairy products. Grains.  There is a test for this we can do called a breath test, if you are positive we will treat you with an antibiotic to see if it helps.  Your symptoms are very suspicious for this condition, as discussed, we will start you on an antibiotic to see if this helps.   Gastroparesis Please do small frequent meals like 4-6 meals a day.  Eat and drink liquids at separate times.  Avoid high fiber foods, cook your vegetables, avoid high fat food.  Suggest spreading protein throughout the day (greek yogurt, glucerna, soft meat, milk, eggs) Choose soft foods that you can mash with a fork When you are more symptomatic, change to pureed foods foods and liquids.  Consider reading "Living well with Gastroparesis" by Reuel Derby Gastroparesis is a condition in which food takes longer than normal to empty from the stomach. This condition is also known as delayed gastric emptying. It is usually a long-term (chronic) condition. There is no cure, but there are treatments and things that you can do at home to help relieve symptoms. Treating the underlying condition  that causes gastroparesis can also help relieve symptoms What are the causes? In many cases, the cause of this condition is not known. Possible causes include: A hormone (endocrine) disorder, such as hypothyroidism or diabetes. A nervous system disease, such as Parkinson's disease or multiple  sclerosis. Cancer, infection, or surgery that affects the stomach or vagus nerve. The vagus nerve runs from your chest, through your neck, and to the lower part of your brain. A connective tissue disorder, such as scleroderma. Certain medicines. What increases the risk? You are more likely to develop this condition if: You have certain disorders or diseases. These may include: An endocrine disorder. An eating disorder. Amyloidosis. Scleroderma. Parkinson's disease. Multiple sclerosis. Cancer or infection of the stomach or the vagus nerve. You have had surgery on your stomach or vagus nerve. You take certain medicines. You are male. What are the signs or symptoms? Symptoms of this condition include: Feeling full after eating very little or a loss of appetite. Nausea, vomiting, or heartburn. Bloating of your abdomen. Inconsistent blood sugar (glucose) levels on blood tests. Unexplained weight loss. Acid from the stomach coming up into the esophagus (gastroesophageal reflux). Sudden tightening (spasm) of the stomach, which can be painful. Symptoms may come and go. Some people may not notice any symptoms. How is this diagnosed? This condition is diagnosed with tests, such as: Tests that check how long it takes food to move through the stomach and intestines. These tests include: Upper gastrointestinal (GI) series. For this test, you drink a liquid that shows up well on X-rays, and then X-rays are taken of your intestines. Gastric emptying scintigraphy. For this test, you eat food that contains a small amount of radioactive material, and then scans are taken. Wireless capsule GI monitoring system. For this test, you swallow a pill (capsule) that records information about how foods and fluid move through your stomach. Gastric manometry. For this test, a tube is passed down your throat and into your stomach to measure electrical and muscular activity. Endoscopy. For this test, a long,  thin tube with a camera and light on the end is passed down your throat and into your stomach to check for problems in your stomach lining. Ultrasound. This test uses sound waves to create images of the inside of your body. This can help rule out gallbladder disease or pancreatitis as a cause of your symptoms. How is this treated? There is no cure for this condition, but treatment and home care may relieve symptoms. Treatment may include: Treating the underlying cause. Managing your symptoms by making changes to your diet and exercise habits. Taking medicines to control nausea and vomiting and to stimulate stomach muscles. Getting food through a feeding tube in the hospital. This may be done in severe cases. Having surgery to insert a device called a gastric electrical stimulator into your body. This device helps improve stomach emptying and control nausea and vomiting. Follow these instructions at home: Take over-the-counter and prescription medicines only as told by your health care provider. Follow instructions from your health care provider about eating or drinking restrictions. Your health care provider may recommend that you: Eat smaller meals more often. Eat low-fat foods. Eat low-fiber forms of high-fiber foods. For example, eat cooked vegetables instead of raw vegetables. Have only liquid foods instead of solid foods. Liquid foods are easier to digest. Drink enough fluid to keep your urine pale yellow. Exercise as often as told by your health care provider. Keep all follow-up visits. This is important.  Contact a health care provider if you: Notice that your symptoms do not improve with treatment. Have new symptoms. Get help right away if you: Have severe pain in your abdomen that does not improve with treatment. Have nausea that is severe or does not go away. Vomit every time you drink fluids. Summary Gastroparesis is a long-term (chronic) condition in which food takes longer than  normal to empty from the stomach. Symptoms include nausea, vomiting, heartburn, bloating of your abdomen, and loss of appetite. Eating smaller portions, low-fat foods, and low-fiber forms of high-fiber foods may help you manage your symptoms. Get help right away if you have severe pain in your abdomen. This information is not intended to replace advice given to you by your health care provider. Make sure you discuss any questions you have with your health care provider. Document Revised: 07/18/2019 Document Reviewed: 07/18/2019 Elsevier Patient Education  2021 [employer].  Miralax is an osmotic laxative.  It only brings more water into the stool.  This is safe to take daily.  Can take up to 17 gram of miralax twice a day.  Mix with juice or coffee.  Start 1 capful at night for 3-4 days and reassess your response in 3-4 days.  You can increase and decrease the dose based on your response.  Remember, it can take up to 3-4 days to take effect OR for the effects to wear off.   I often pair this with benefiber in the morning to help assure the stool is not too loose.   Hiatal Hernia  A hiatal hernia occurs when part of the stomach slides above the muscle that separates the abdomen from the chest (diaphragm). A person can be born with a hiatal hernia (congenital), or it may develop over time. In almost all cases of hiatal hernia, only the top part of the stomach pushes through the diaphragm. Many people have a hiatal hernia with no symptoms. The larger the hernia, the more likely it is that you will have symptoms. In some cases, a hiatal hernia allows stomach acid to flow back into the tube that carries food from your mouth to your stomach (esophagus). This may cause heartburn symptoms. The development of heartburn symptoms may mean that you have a condition called gastroesophageal reflux disease (GERD). What are the causes? This condition is caused by a weakness in the opening (hiatus) where  the esophagus passes through the diaphragm to attach to the upper part of the stomach. A person may be born with a weakness in the hiatus, or a weakness can develop over time. What increases the risk? This condition is more likely to develop in: Older people. Age is a major risk factor for a hiatal hernia, especially if you are over the age of 65. Pregnant women. People who are overweight. People who have frequent constipation. What are the signs or symptoms? Symptoms of this condition usually develop in the form of GERD symptoms. Symptoms include: Heartburn. Upset stomach (indigestion). Trouble swallowing. Coughing or wheezing. Wheezing is making high-pitched whistling sounds when you breathe. Sore throat. Chest pain. Nausea and vomiting. How is this diagnosed? This condition may be diagnosed during testing for GERD. Tests that may be done include: X-rays of your stomach or chest. An upper gastrointestinal (GI) series. This is an X-ray exam of your GI tract that is taken after you swallow a chalky liquid that shows up clearly on the X-ray. Endoscopy. This is a procedure to look into your stomach using a  thin, flexible tube that has a tiny camera and light on the end of it. How is this treated? This condition may be treated by: Dietary and lifestyle changes to help reduce GERD symptoms. Medicines. These may include: Over-the-counter antacids. Medicines that make your stomach empty more quickly. Medicines that block the production of stomach acid (H2 blockers). Stronger medicines to reduce stomach acid (proton pump inhibitors). Surgery to repair the hernia, if other treatments are not helping. If you have no symptoms, you may not need treatment. Follow these instructions at home: Lifestyle and activity Do not use any products that contain nicotine or tobacco. These products include cigarettes, chewing tobacco, and vaping devices, such as e-cigarettes. If you need help quitting, ask  your health care provider. Try to achieve and maintain a healthy body weight. Avoid putting pressure on your abdomen. Anything that puts pressure on your abdomen increases the amount of acid that may be pushed up into your esophagus. Avoid bending over, especially after eating. Raise the head of your bed by putting blocks under the legs. This keeps your head and esophagus higher than your stomach. Do not wear tight clothing around your chest or stomach. Try not to strain when having a bowel movement, when urinating, or when lifting heavy objects. Eating and drinking Avoid foods that can worsen GERD symptoms. These may include: Fatty foods, like fried foods. Citrus fruits, like oranges or lemon. Other foods and drinks that contain acid, like orange juice or tomatoes. Spicy food. Chocolate. Eat frequent small meals instead of three large meals a day. This helps prevent your stomach from getting too full. Eat slowly. Do not lie down right after eating. Do not eat 1-2 hours before bed. Do not drink beverages with caffeine. These include cola, coffee, cocoa, and tea. Do not drink alcohol. General instructions Take over-the-counter and prescription medicines only as told by your health care provider. Keep all follow-up visits. Your health care provider will want to check that any new prescribed medicines are helping your symptoms. Contact a health care provider if: Your symptoms are not controlled with medicines or lifestyle changes. You are having trouble swallowing. You have coughing or wheezing that will not go away. Your pain is getting worse. Your pain spreads to your arms, neck, jaw, teeth, or back. You feel nauseous or you vomit. Get help right away if: You have shortness of breath. You vomit blood. You have bright red blood in your stools. You have black, tarry stools. These symptoms may be an emergency. Get help right away. Call 911. Do not wait to see if the symptoms will go  away. Do not drive yourself to the hospital. Summary A hiatal hernia occurs when part of the stomach slides above the muscle that separates the abdomen from the chest. A person may be born with a weakness in the hiatus, or a weakness can develop over time. Symptoms of a hiatal hernia may include heartburn, trouble swallowing, or sore throat. Management of a hiatal hernia includes eating frequent small meals instead of three large meals a day. Get help right away if you vomit blood, have bright red blood in your stools, or have black, tarry stools. This information is not intended to replace advice given to you by your health care provider. Make sure you discuss any questions you have with your health care provider. Document Revised: 05/07/2021 Document Reviewed: 05/07/2021 Elsevier Patient Education  2024 [employer].

## 2023-06-24 NOTE — Telephone Encounter (Signed)
 Patient would like to know when will he receive his CPAP machine. Could you give the patient a call.

## 2023-06-25 ENCOUNTER — Other Ambulatory Visit: Payer: Self-pay

## 2023-06-25 ENCOUNTER — Emergency Department (HOSPITAL_BASED_OUTPATIENT_CLINIC_OR_DEPARTMENT_OTHER): Admitting: Radiology

## 2023-06-25 ENCOUNTER — Encounter (HOSPITAL_BASED_OUTPATIENT_CLINIC_OR_DEPARTMENT_OTHER): Payer: Self-pay

## 2023-06-25 ENCOUNTER — Emergency Department (HOSPITAL_BASED_OUTPATIENT_CLINIC_OR_DEPARTMENT_OTHER)
Admission: EM | Admit: 2023-06-25 | Discharge: 2023-06-25 | Disposition: A | Discharge status: Home / Self Care | Attending: Emergency Medicine | Admitting: Emergency Medicine

## 2023-06-25 DIAGNOSIS — G4730 Sleep apnea, unspecified: Secondary | ICD-10-CM | POA: Insufficient documentation

## 2023-06-25 DIAGNOSIS — Z8673 Personal history of transient ischemic attack (TIA), and cerebral infarction without residual deficits: Secondary | ICD-10-CM | POA: Diagnosis not present

## 2023-06-25 DIAGNOSIS — Z79899 Other long term (current) drug therapy: Secondary | ICD-10-CM | POA: Insufficient documentation

## 2023-06-25 DIAGNOSIS — R0602 Shortness of breath: Secondary | ICD-10-CM | POA: Diagnosis present

## 2023-06-25 DIAGNOSIS — Z7982 Long term (current) use of aspirin: Secondary | ICD-10-CM | POA: Diagnosis not present

## 2023-06-25 DIAGNOSIS — I10 Essential (primary) hypertension: Secondary | ICD-10-CM | POA: Diagnosis not present

## 2023-06-25 DIAGNOSIS — I2510 Atherosclerotic heart disease of native coronary artery without angina pectoris: Secondary | ICD-10-CM | POA: Diagnosis not present

## 2023-06-25 DIAGNOSIS — R0600 Dyspnea, unspecified: Secondary | ICD-10-CM

## 2023-06-25 LAB — CBC
HCT: 42 % (ref 39.0–52.0)
Hemoglobin: 14.6 g/dL (ref 13.0–17.0)
MCH: 29.7 pg (ref 26.0–34.0)
MCHC: 34.8 g/dL (ref 30.0–36.0)
MCV: 85.4 fL (ref 80.0–100.0)
Platelets: 195 10*3/uL (ref 150–400)
RBC: 4.92 MIL/uL (ref 4.22–5.81)
RDW: 12.1 % (ref 11.5–15.5)
WBC: 7.6 10*3/uL (ref 4.0–10.5)
nRBC: 0 % (ref 0.0–0.2)

## 2023-06-25 LAB — BRAIN NATRIURETIC PEPTIDE: B Natriuretic Peptide: 33.5 pg/mL (ref 0.0–100.0)

## 2023-06-25 LAB — BASIC METABOLIC PANEL WITH GFR
Anion gap: 6 (ref 5–15)
BUN: 13 mg/dL (ref 8–23)
CO2: 26 mmol/L (ref 22–32)
Calcium: 8.8 mg/dL — ABNORMAL LOW (ref 8.9–10.3)
Chloride: 107 mmol/L (ref 98–111)
Creatinine, Ser: 0.94 mg/dL (ref 0.61–1.24)
GFR, Estimated: >60 mL/min (ref >60)
Glucose, Bld: 118 mg/dL — ABNORMAL HIGH (ref 70–99)
Potassium: 4.6 mmol/L (ref 3.5–5.1)
Sodium: 139 mmol/L (ref 135–145)

## 2023-06-25 LAB — TROPONIN I (HIGH SENSITIVITY): Troponin I (High Sensitivity): 2 ng/L (ref <18)

## 2023-06-25 LAB — RESP PANEL BY RT-PCR (RSV, FLU A&B, COVID)  RVPGX2
Influenza A by PCR: NEGATIVE
Influenza B by PCR: NEGATIVE
Resp Syncytial Virus by PCR: NEGATIVE
SARS Coronavirus 2 by RT PCR: NEGATIVE

## 2023-06-25 NOTE — ED Provider Notes (Signed)
 [place] EM[facility]ENCY DEPARTMENT AT [facility] Provider Note   CSN: [id number] Arrival date & time: 06/25/23  0505     History  Chief Complaint  Patient presents with   Shortness of Breath    Todd [name] is a 63 y.o. male.   Shortness of Breath    Patient has history of TIA migraines anxiety chest pain coronary artery disease hyperlipidemia NSTEMI hypertension who presents to the ED for evaluation of shortness of breath.  Patient states he has been having episodes of difficulty breathing.  He has been waking up at night feeling like he cannot catch his breath.  Patient states it is because he cannot take a deep breath.  Patient states is not painful to do so it just stops.  He has not noticed any leg swelling.  He has not had any chest pain.  He does not have any fevers chills or coughing.  Patient states he has been having this issue and has seen his cardiologist as well as his pulmonary doctor.  Records do show evaluation for shortness of breath over the last few months.  Patient has been evaluated by his pulmonary doctor as well as his cardiac doctor.  Patient states they have not figured out the source.  Review of notes from his pulmonary visit in March of this Inder indicate trouble with sleeping ongoing for years.  There is mention of very loud snoring at night as well as struggling to breathe at night and gasps for air.  Patient has not had normal pulmonary function testing  Home Medications Prior to Admission medications   Medication Sig Start Date End Date Taking? Authorizing Provider  ALPRAZolam (XANAX) 0.25 MG tablet Take 1-2 tablets (0.25-0.5 mg total) by mouth 2 (two) times daily as needed for anxiety. Patient not taking: Reported on 06/24/2023 06/02/23   Shade Flood, MD  aspirin EC 81 MG tablet Take 81 mg by mouth daily. Swallow whole.    [provider]  metoprolol succinate (TOPROL XL) 25 MG 24 hr tablet Take 1 tablet (25 mg total) by mouth  daily. 07/14/22   Hilty, Lisette Abu, MD  pantoprazole (PROTONIX) 40 MG tablet Take 1 tablet (40 mg total) by mouth 2 (two) times daily. 06/11/23 06/10/24  Jenel Lucks, MD  rosuvastatin (CRESTOR) 20 MG tablet TAKE 1 TABLET BY MOUTH EVERY DAY 01/05/23   Hilty, Lisette Abu, MD      Allergies    Wellbutrin [bupropion hcl]    Review of Systems   Review of Systems  Respiratory:  Positive for shortness of breath.     Physical Exam Updated Vital Signs BP 111/73   Pulse (!) 53   Temp 98.2 F (36.8 C) (Oral)   Resp 18   SpO2 95%  Physical Exam Vitals and nursing note reviewed.  Constitutional:      General: He is not in acute distress.    Appearance: He is well-developed.  HENT:     Head: Normocephalic and atraumatic.     Right Ear: External ear normal.     Left Ear: External ear normal.  Eyes:     General: No scleral icterus.       Right eye: No discharge.        Left eye: No discharge.     Conjunctiva/sclera: Conjunctivae normal.  Neck:     Trachea: No tracheal deviation.  Cardiovascular:     Rate and Rhythm: Normal rate and regular rhythm.  Pulmonary:  Effort: Pulmonary effort is normal. No respiratory distress.     Breath sounds: Normal breath sounds. No stridor. No wheezing or rales.  Abdominal:     General: Bowel sounds are normal. There is no distension.     Palpations: Abdomen is soft.     Tenderness: There is no abdominal tenderness. There is no guarding or rebound.  Musculoskeletal:        General: No tenderness or deformity.     Cervical back: Neck supple.  Skin:    General: Skin is warm and dry.     Findings: No rash.  Neurological:     General: No focal deficit present.     Mental Status: He is alert.     Cranial Nerves: No cranial nerve deficit, dysarthria or facial asymmetry.     Sensory: No sensory deficit.     Motor: No abnormal muscle tone or seizure activity.     Coordination: Coordination normal.  Psychiatric:        Mood and Affect: Mood  normal.     ED Results / Procedures / Treatments   Labs (all labs ordered are listed, but only abnormal results are displayed) Labs Reviewed  BASIC METABOLIC PANEL WITH GFR - Abnormal; Notable for the following components:      Result Value   Glucose, Bld 118 (*)    Calcium 8.8 (*)    All other components within normal limits  RESP PANEL BY RT-PCR (RSV, FLU A&B, COVID)  RVPGX2  CBC  BRAIN NATRIURETIC PEPTIDE  TROPONIN I (HIGH SENSITIVITY)    EKG EKG Interpretation Date/Time:  Thursday June 25 2023 05:22:19 EDT Ventricular Rate:  75 PR Interval:  152 QRS Duration:  84 QT Interval:  400 QTC Calculation: 446 R Axis:   51  Text Interpretation: Normal sinus rhythm Normal ECG When compared with ECG of 29-May-2023 21:10, Criteria for Inferior infarct are no longer Present Confirmed by Dione Booze ([id number]) on 06/25/2023 5:40:53 AM  Radiology DG Chest 2 View Result Date: 06/25/2023 CLINICAL DATA:  Shortness of breath. EXAM: CHEST - 2 VIEW COMPARISON:  05/29/2023 FINDINGS: The lungs are clear without focal pneumonia, edema, pneumothorax or pleural effusion. Nodular density/densities projecting over the lungs are compatible with pads for telemetry leads. The cardiopericardial silhouette is within normal limits for size. No acute bony abnormality. IMPRESSION: No active cardiopulmonary disease. Electronically Signed   By: Kennith Center M.D.   On: 06/25/2023 05:43    Procedures Procedures    Medications Ordered in ED Medications - No data to display  ED Course/ Medical Decision Making/ A&P Clinical Course as of 06/25/23 1048  Thu Jun 25, 2023  0933 Resp panel by RT-PCR (RSV, Flu A&B, Covid) Anterior Nasal Swab Covid flu RSV negative.  CBC and metabolic panel normal.  Troponin normal [JK]  0934 Chest x-ray without acute findings. [JK]    Clinical Course User Index [JK] Linwood Dibbles, MD                                 Medical Decision Making Problems Addressed: Dyspnea,  unspecified type: chronic illness or injury with exacerbation, progression, or side effects of treatment Sleep apnea, unspecified type: chronic illness or injury with exacerbation, progression, or side effects of treatment  Amount and/or Complexity of Data Reviewed Labs: ordered. Decision-making details documented in ED Course. Radiology: ordered and independent interpretation performed.   Patient presented to the ED with complaints of  shortness of breath.  Patient states he has been having recurrent issues night.  He has been seeing cardiology as well as pulmonology.  His ED workup is reassuring.  No signs of pneumonia.  No evidence of pulmonary edema or pneumothorax on x-ray.  His cardiac enzymes are normal.  Doubt ACS.  Patient concerned about the recurrent episodes.  I reviewed his previous workup and sounds like there was consideration of sleep apnea.  There may be anxiety component contributing to his symptoms as well.  No signs of any emergent etiology in the ED.  Patient has plans to start using CPAP.  Will have him follow-up with PCP and pulmonary to be rechecked  Evaluation and diagnostic testing in the emergency department does not suggest an emergent condition requiring admission or immediate intervention beyond what has been performed at this time.  The patient is safe for discharge and has been instructed to return immediately for worsening symptoms, change in symptoms or any other concerns.         Final Clinical Impression(s) / ED Diagnoses Final diagnoses:  Dyspnea, unspecified type  Sleep apnea, unspecified type    Rx / DC Orders ED Discharge Orders     None         Linwood Dibbles, MD 06/25/23 1048

## 2023-06-25 NOTE — ED Triage Notes (Signed)
 Pt presents via POV c/o SOB x1 hr. Ambulatory to triage. Speaking in complete sentences. A&O x4

## 2023-06-25 NOTE — Discharge Instructions (Signed)
 The test today in the ED did not show any signs of pneumonia heart attack or fluid on your lungs.  Possible your symptoms are related to your sleep apnea.  Follow-up with your pulmonary and primary care doctor for further treatment and evaluation

## 2023-06-25 NOTE — Telephone Encounter (Signed)
 Pt would like an update in regards to his CPAP machine. Please advise

## 2023-06-26 ENCOUNTER — Encounter: Payer: Self-pay | Admitting: Internal Medicine

## 2023-06-26 ENCOUNTER — Ambulatory Visit: Admitting: Internal Medicine

## 2023-06-26 VITALS — BP 110/72 | HR 69 | Ht 66.0 in | Wt 173.0 lb

## 2023-06-26 DIAGNOSIS — G4733 Obstructive sleep apnea (adult) (pediatric): Secondary | ICD-10-CM

## 2023-06-26 DIAGNOSIS — R0602 Shortness of breath: Secondary | ICD-10-CM | POA: Diagnosis not present

## 2023-06-26 DIAGNOSIS — K449 Diaphragmatic hernia without obstruction or gangrene: Secondary | ICD-10-CM | POA: Diagnosis not present

## 2023-06-26 NOTE — Patient Instructions (Addendum)
 We will refill Protonix We will make referral to General Surgery-hernia assessment [place]-Dr Pabon  Avoid Allergens and Irritants Avoid secondhand smoke Avoid SICK contacts Recommend  Masking  when appropriate Recommend Keep up-to-date with vaccinations

## 2023-06-26 NOTE — Progress Notes (Signed)
 [facility] [place] [facility] Consultation      Date: 06/26/2023,   MRN# [id number] Todd [name] [birth year]    CHIEF COMPLAINT:   Follow-up assessment for shortness of breath Follow-up assessment for OSA   HISTORY OF PRESENT ILLNESS   Follow-up assessment for shortness of breath No significant respiratory findings at this time No exacerbation at this time No evidence of heart failure at this time No evidence or signs of infection at this time No respiratory distress No fevers, chills, nausea, vomiting, diarrhea No evidence of lower extremity edema No evidence hemoptysis   Pulmonary function test April 17, 2023 Findings reported to patient in detail Postbronchodilator FEV1 FVC ratio was 41% predicted FEV1 is 51% predicted No significant bronchodilator response Total lung capacity 93% predicted Elevated RV and RV/TLC ratio consistent with hyperinflation and air trapping DLCO within normal limits flow-volume loops show restrictive pattern Expiratory limb shows blunting and scooping Overall findings suggest severe obstructive lung disease  Pulmonary function test June 09, 2023 Findings reported to patient in detail Postbronchodilator FEV1 FVC ratio is 80% predicted FEV1 is 104% predicted FVC is 98% predicted Total lung capacity is 100% predicted RV and RV/TLC ratio is 107% predicted DLCO within normal limits Flow-volume loops show mild obstructive scooping of the expiratory limb Overall findings normal pulmonary function testing No significant abnormality seen  There is a significant difference in pulmonary function test from January to March of this year  Patient has excellent exercise capacity work walks 1 mile a day without any issues No significant shortness of breath or wheezing noted Non-smoker nonalcoholic Patient does cook often  As I assessed several months ago there is no correlation between symptoms and the pulmonary function testing There is  a significant discrepancy in his clinical exam and physical findings and pulmonary function testing Therefore a repeat pulmonary function test was obtained which showed no significant obstructive or restrictive disease Current pulmonary function test in March 2025 indicates normal pulmonary function testing   Assessment OSA Split-night sleep study performed in March 2025 AHI 3% showed 7.7 AHI in the supine position 10.8 Based on review of data patient does have mild OSA Patient to be started on CPAP-already ordered  +GERD and Bloating-uncontrolled gas Recommend PPI and GI consult for EGD    Patient's main complaint is that he has intermittent reflux and severe bloating He says that belching and burping relieves his abdominal pressure which then relieves his shortness of breath Patient has had colonoscopy to assess for bleeding and cancer   upper endoscopy for further evaluation +hiatel hernia I will continue to prescribe Protonix    PAST MEDICAL HISTORY   Past Medical History:  Diagnosis Date   CAD (coronary artery disease)    Complicated migraine    Deafness in left ear    Diverticulosis    External hemorrhoids    Headache    Hypertension    Internal hemorrhoids    Palpitations    Stroke (HCC)    TIA (transient ischemic attack)    Tubular adenoma of colon    Vision abnormalities      SURGICAL HISTORY   Past Surgical History:  Procedure Laterality Date   APPENDECTOMY  12/01/11   lap appy   CORONARY STENT INTERVENTION N/A 07/17/2021   Procedure: CORONARY STENT INTERVENTION;  Surgeon: Kathleene Hazel, MD;  [facility]: [facility];  Service: Cardiovascular;  Laterality: N/A;   LAPAROSCOPIC APPENDECTOMY  12/02/2011   Procedure: APPENDECTOMY LAPAROSCOPIC;  Surgeon: Lodema Pilot, DO;  Location: [facility];  Service: General;  Laterality: N/A;   LEFT HEART CATH AND CORONARY ANGIOGRAPHY N/A 07/17/2021   Procedure: LEFT HEART CATH AND CORONARY ANGIOGRAPHY;  Surgeon:  Kathleene Hazel, MD;  Location: [facility];  Service: Cardiovascular;  Laterality: N/A;     FAMILY HISTORY   Family History  Problem Relation Age of Onset   Hypertension Mother    Hypertension Father    Anemia Neg Hx    Arrhythmia Neg Hx    Asthma Neg Hx    Clotting disorder Neg Hx    Fainting Neg Hx    Heart attack Neg Hx    Heart disease Neg Hx    Heart failure Neg Hx    Hyperlipidemia Neg Hx    Colon cancer Neg Hx    Esophageal cancer Neg Hx    Stomach cancer Neg Hx    Rectal cancer Neg Hx    Colon polyps Neg Hx      SOCIAL HISTORY   Social History   Tobacco Use   Smoking status: Never   Smokeless tobacco: Never  Vaping Use   Vaping status: Never Used  Substance Use Topics   Alcohol use: No   Drug use: No     MEDICATIONS    Home Medication:  Current Outpatient Rx   Order #: [id number] Class: Normal   Order #: [id number] Class: Historical Med   Order #: [id number] Class: Normal   Order #: [id number] Class: Normal   Order #: [id number] Class: Normal    Current Medication:  Current Outpatient Medications:    ALPRAZolam (XANAX) 0.25 MG tablet, Take 1-2 tablets (0.25-0.5 mg total) by mouth 2 (two) times daily as needed for anxiety. (Patient not taking: Reported on 06/24/2023), Disp: 20 tablet, Rfl: 0   aspirin EC 81 MG tablet, Take 81 mg by mouth daily. Swallow whole., Disp: , Rfl:    metoprolol succinate (TOPROL XL) 25 MG 24 hr tablet, Take 1 tablet (25 mg total) by mouth daily., Disp: 90 tablet, Rfl: 3   pantoprazole (PROTONIX) 40 MG tablet, Take 1 tablet (40 mg total) by mouth 2 (two) times daily., Disp: 60 tablet, Rfl: 1   rosuvastatin (CRESTOR) 20 MG tablet, TAKE 1 TABLET BY MOUTH EVERY DAY, Disp: 30 tablet, Rfl: 5    ALLERGIES   Wellbutrin [bupropion hcl]  BP 110/72   Pulse 69   Ht 5\' 6"  (1.676 m)   Wt 173 lb (78.5 kg)   SpO2 95%   BMI 27.92 kg/m     Review of Systems: Gen:  Denies  fever, sweats, chills weight loss  HEENT:  Denies blurred vision, double vision, ear pain, eye pain, hearing loss, nose bleeds, sore throat Cardiac:  No dizziness, chest pain or heaviness, chest tightness,edema, No JVD Resp:   No cough, -sputum production, -shortness of breath,-wheezing, -hemoptysis,  Other:  All other systems negative   Physical Examination:   General Appearance: No distress  EYES PERRLA, EOM intact.   NECK Supple, No JVD Pulmonary: normal breath sounds, No wheezing.  CardiovascularNormal S1,S2.  No m/r/g.   Abdomen: Benign, Soft, non-tender. Neurology UE/LE 5/5 strength, no focal deficits Ext pulses intact, cap refill intact ALL OTHER ROS ARE NEGATIVE     IMAGING    DG Chest 2 View Result Date: 06/25/2023 CLINICAL DATA:  Shortness of breath. EXAM: CHEST - 2 VIEW COMPARISON:  05/29/2023 FINDINGS: The lungs are clear without focal pneumonia, edema, pneumothorax or pleural effusion. Nodular density/densities projecting over the lungs are compatible with  pads for telemetry leads. The cardiopericardial silhouette is within normal limits for size. No acute bony abnormality. IMPRESSION: No active cardiopulmonary disease. Electronically Signed   By: Kennith Center M.D.   On: 06/25/2023 05:43   CT CHEST WO CONTRAST Result Date: 06/16/2023 CLINICAL DATA:  Abnormal pulmonary function, COPD. EXAM: CT CHEST WITHOUT CONTRAST TECHNIQUE: Multidetector CT imaging of the chest was performed following the standard protocol without IV contrast. RADIATION DOSE REDUCTION: This exam was performed according to the departmental dose-optimization program which includes automated exposure control, adjustment of the mA and/or kV according to patient size and/or use of iterative reconstruction technique. COMPARISON:  Chest x-ray May 29, 2023, May 22, 2023 October 01, 2022 FINDINGS: Cardiovascular: No significant vascular findings. Normal heart size. No pericardial effusion. Mild coronary artery calcifications LAD coronary stent. No  pericardial effusions. Ascending aorta unremarkable Mediastinum/Nodes: No enlarged mediastinal or axillary lymph nodes. Thyroid gland, trachea, and esophagus demonstrate no significant findings. Lungs/Pleura: Lungs are clear. No pleural effusion or pneumothorax. Upper Abdomen: No acute abnormality. Musculoskeletal: IMPRESSION: *No acute findings. *Mild coronary artery calcifications. LAD coronary stent. Electronically Signed   By: Shaaron Adler M.D.   On: 06/16/2023 09:23   Sleep Study Documents Result Date: 06/05/2023 Ordered by an unspecified provider.  DG Chest Portable 1 View Result Date: 05/29/2023 CLINICAL DATA:  Shortness of breath EXAM: PORTABLE CHEST 1 VIEW COMPARISON:  05/22/2023 FINDINGS: The heart size and mediastinal contours are within normal limits. Both lungs are clear. The visualized skeletal structures are unremarkable. IMPRESSION: No active disease. Electronically Signed   By: Jasmine Pang M.D.   On: 05/29/2023 23:59      ASSESSMENT/PLAN   63 year old pleasant male seen today for signs symptoms of obstructive sleep apnea with HST showing mild OSA with AHI between 7 and 10 based on position with intermittent shortness of breath likely related to abdominal symptoms of belching and burping with bloating His repeat pulmonary function tests are within normal limits no significant findings for obstructive or restrictive lung disease which was noted on previous pulmonary function test Patient has very good exercise capacity without any issues  Previously abnormal pulmonary function test Likely related to abdominal symptoms Current pulmonary function test are within normal limits At this time no significant respiratory issues with obstructive or restrictive lung disease Patient with good exercise capacity  Assessment of sleep apnea Mild OSA AHI between 7 and 10 based on position Patient to be started on CPAP  Hiatal hernia and reflux Refill Protonix Recommend [facility] to [facility] to assess for hernia repair       MEDICATION ADJUSTMENTS/LABS AND TESTS ORDERED: We will refill Protonix We will make referral to General Surgery-hernia assessment [facility]-Dr Pabon Avoid Allergens and Irritants Avoid secondhand smoke Avoid SICK contacts Recommend  Masking  when appropriate Recommend Keep up-to-date with vaccinations   CURRENT MEDICATIONS REVIEWED AT LENGTH WITH PATIENT TODAY   Patient  satisfied with Plan of action and management. All questions answered   Follow up 6 months   I spent a total of 42  minutes reviewing chart data, face-to-face evaluation with the patient, counseling and coordination of care as detailed above.      Lucie Leather, M.D.  [facility]  Medical Director [facility] Medical Director [facility] Cardio-Pulmonary Department

## 2023-06-29 ENCOUNTER — Other Ambulatory Visit (HOSPITAL_COMMUNITY): Payer: Self-pay

## 2023-06-29 ENCOUNTER — Ambulatory Visit (INDEPENDENT_AMBULATORY_CARE_PROVIDER_SITE_OTHER): Admitting: Family Medicine

## 2023-06-29 ENCOUNTER — Telehealth: Payer: Self-pay

## 2023-06-29 VITALS — BP 122/70 | HR 66 | Temp 98.3°F | Ht 66.0 in | Wt 171.6 lb

## 2023-06-29 DIAGNOSIS — R069 Unspecified abnormalities of breathing: Secondary | ICD-10-CM

## 2023-06-29 DIAGNOSIS — F418 Other specified anxiety disorders: Secondary | ICD-10-CM

## 2023-06-29 DIAGNOSIS — R140 Abdominal distension (gaseous): Secondary | ICD-10-CM | POA: Diagnosis not present

## 2023-06-29 DIAGNOSIS — R0600 Dyspnea, unspecified: Secondary | ICD-10-CM | POA: Diagnosis not present

## 2023-06-29 DIAGNOSIS — G4733 Obstructive sleep apnea (adult) (pediatric): Secondary | ICD-10-CM

## 2023-06-29 MED ORDER — SERTRALINE HCL 25 MG PO TABS
25.0000 mg | ORAL_TABLET | Freq: Every day | ORAL | 3 refills | Status: DC
Start: 2023-06-29 — End: 2023-10-27

## 2023-06-29 NOTE — Telephone Encounter (Signed)
-----   Message from Nurse Eileen Stanford E sent at 06/29/2023  8:04 AM EDT ----- Regarding: PA for repatha sureclick needed Hi Team! This patient needs a PA for [employer].   Thanks!

## 2023-06-29 NOTE — Progress Notes (Unsigned)
 Subjective:  Patient ID: Todd [name], male    DOB: 1960-12-23  Age: 63 y.o. MRN: [id number]  CC:  Chief Complaint  Patient presents with   Breathing Problem    Pt notes had PFT scan and results are conflicting with what was previously reported   Follow-up    Pt notes had his sleep study done and has also been informed he has a hernia    Anxiety    Pt notes some tiredness when taking the med but it does work when he has needed it     HPI [facility] presents for follow up - initial chart review.   Dyspnea, anxiety, bloating Last discussed in February.  PFTs January 24.  Study indicated severe obstruction, narrowing the airway did not improve with albuterol.  Did recommend restarting omeprazole at that time as that appeared to have improved his symptoms previously, plan for following up with pulmonary.  We also discussed avoidance of carbonated beverages to minimize bloating.  Visit with Dr. Belia Heman with pulmonary on March 6.  Plan for repeat PFTs in 3 months.  There was a discrepancy in his symptoms without cough or wheeze given prior severe obstructive lung disease.  CT chest was completed March 17.  No acute findings, mild coronary artery calcifications and LAD coronary stent. Repeat PFTs performed March 18.  Follow-up with Dr. Belia Heman 3 days ago.  Overall findings were normal at that time without significant abnormality.  No significant obstructive or restrictive disease on repeat testing.  Plan for continued follow-up with gastroenterology, continue PPI.  ER visit March 7 for dyspnea.Negative respiratory panel, normal BNP, lipase, troponin x 2 and no active disease on chest x-ray.  Since last visit did have a sleep study, March 10, AutoPap was ordered.  Mild OSA with AHI of 7.0, 10.8 during supine sleep and loud snoring.  O2 nadir 90%.  Gastroenterology visit March 14.  Continued on pantoprazole 40 mg daily.  Add MiraLAX for possible constipation contributing, and EGD  scheduled. Endoscopy March 20.  Gastritis noted, biopsied, 3 cm hiatal hernia and erythematous duodenopathy.  Recommended increasing pantoprazole to twice per day for 4 weeks to help with gastritis/duodenitis.  Avoidance of NSAIDs discussed.  Pathology with reactive gastropathy, mild chronic gastritis, H. pylori stain negative.  Negative for intestinal metaplasia dysplasia or carcinoma.  Repeat visit with gastroenterology April 2.  Plan for SIBO testing, and gastric emptying study.  Diaphragmatic breathing information provided. Reassuring evaluation in the ER at that time. ER visit April 3 for dyspnea.  Reassuring eval in the ER.  Unlikely ACS with normal cardiac enzymes, no evidence of edema or pneumothorax on x-ray.  Possible anxiety component.  Planned on starting CPAP.  Office visit with cardiology, Dr. Rennis Golden on March 31.  NMR, lipoprotein a obtained. Lipoprotein A 193.9, LDL and particle number and LDL above target. Plan for Repatha - has been ordered, waiting on prior auth.   Low-dose alprazolam if needed for anxiety symptoms, 0.25 mg.  Controlled substance database reviewed.  #20 filled on 06/02/2023, previously #60 on 08/05/2021.  On PPI BID now past week.  plan for follow up with GI as above.  Has not started CPAP yet - waiting on machine  Has taken alprazolam twice since last visit. Not feeling anxious recently.  No advil/alleve.  Still feels short of breath with exercise. Question of whether the hiatal hernia. Still feels like can't take a deep breath.     History Patient Active Problem  List   Diagnosis Date Noted   Tonsillar abscess 02/24/2023   Sepsis (HCC) 02/24/2023   Hyperbilirubinemia 02/24/2023   Essential hypertension 02/24/2023   Hearing loss of left ear 06/02/2022   Chronic anticoagulation 08/26/2021   Internal hemorrhoid, bleeding 08/26/2021   Prolapsed internal hemorrhoids, grade 3 08/26/2021   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  coronary artery disease 08/26/2021   NSTEMI (non-ST elevated myocardial infarction) (HCC) 07/20/2021   CAD (coronary artery disease) 07/18/2021   Hyperlipidemia 07/18/2021   Unstable angina (HCC)    Non-recurrent acute serous otitis media of right ear 10/14/2018   Shortness of breath 11/12/2016   Chest pain 11/12/2016   Heart palpitations 10/20/2013   Situational anxiety 10/20/2013   Migraine with visual aura 08/24/2013   TIA (transient ischemic attack) 08/23/2013   Blurred vision 08/23/2013   Anomia 08/23/2013   Past Medical History:  Diagnosis Date   CAD (coronary artery disease)    Complicated migraine    Deafness in left ear    Diverticulosis    External hemorrhoids    Headache    Hypertension    Internal hemorrhoids    Palpitations    Stroke (HCC)    TIA (transient ischemic attack)    Tubular adenoma of colon    Vision abnormalities    Past Surgical History:  Procedure Laterality Date   APPENDECTOMY  12/01/11   lap appy   CORONARY STENT INTERVENTION N/A 07/17/2021   Procedure: CORONARY STENT INTERVENTION;  Surgeon: Kathleene Hazel, MD;  Location: [facility];  Service: Cardiovascular;  Laterality: N/A;   LAPAROSCOPIC APPENDECTOMY  12/02/2011   Procedure: APPENDECTOMY LAPAROSCOPIC;  Surgeon: Lodema Pilot, DO;  Location: [facility];  Service: General;  Laterality: N/A;   LEFT HEART CATH AND CORONARY ANGIOGRAPHY N/A 07/17/2021   Procedure: LEFT HEART CATH AND CORONARY ANGIOGRAPHY;  Surgeon: Kathleene Hazel, MD;  Location: [facility];  Service: Cardiovascular;  Laterality: N/A;   Allergies  Allergen Reactions   Wellbutrin [Bupropion Hcl] Itching and Rash   Prior to Admission medications   Medication Sig Start Date End Date Taking? Authorizing Provider  aspirin EC 81 MG tablet Take 81 mg by mouth daily. Swallow whole.   Yes [provider]  metoprolol succinate (TOPROL XL) 25 MG 24 hr tablet Take 1 tablet (25 mg total) by mouth daily.  07/14/22  Yes Hilty, Lisette Abu, MD  pantoprazole (PROTONIX) 40 MG tablet Take 1 tablet (40 mg total) by mouth 2 (two) times daily. 06/11/23 06/10/24 Yes Jenel Lucks, MD  rosuvastatin (CRESTOR) 20 MG tablet TAKE 1 TABLET BY MOUTH EVERY DAY 01/05/23  Yes Hilty, Lisette Abu, MD   Social History   Socioeconomic History   Marital status: Divorced    Spouse name: Not on file   Number of children: Not on file   Years of education: Not on file   Highest education level: Not on file  Occupational History   Occupation: part time  Tobacco Use   Smoking status: Never   Smokeless tobacco: Never  Vaping Use   Vaping status: Never Used  Substance and Sexual Activity   Alcohol use: No   Drug use: No   Sexual activity: Not Currently  Other Topics Concern   Not on file  Social History Narrative   Lives alone   Right Handed   Drinks very little caffeine   Social Drivers of Health   Financial Resource Strain: Not on file  Food Insecurity: No Food Insecurity (02/26/2023)   Hunger Vital Sign    Worried About Running Out of Food in the Last Year: Never true    Ran Out of Food in the Last Year: Never true  Transportation Needs: No Transportation Needs (02/26/2023)   PRAPARE - Administrator, Civil Service (Medical): No    Lack of Transportation (Non-Medical): No  Physical Activity: Not on file  Stress: Not on file  Social Connections: Not on file  Intimate Partner Violence: Not At Risk (03/11/2023)   Received from [facility]   HITS    Over the last 12 months how often did your partner physically hurt you?: Never    Over the last 12 months how often did your partner insult you or talk down to you?: Never    Over the last 12 months how often did your partner threaten you with physical harm?: Never    Over the last 12 months how often did your partner scream or curse at you?: Never    Review of Systems   Objective:   Vitals:   06/29/23 1013  BP: 122/70  Pulse: 66   Temp: 98.3 F (36.8 C)  TempSrc: Temporal  SpO2: 98%  Weight: 171 lb 9.6 oz (77.8 kg)  Height: 5\' 6"  (1.676 m)     Physical Exam Vitals reviewed.  Constitutional:      Appearance: Normal appearance. He is well-developed.  HENT:     Head: Normocephalic and atraumatic.  Neck:     Vascular: No carotid bruit or JVD.  Cardiovascular:     Rate and Rhythm: Normal rate and regular rhythm.     Heart sounds: Normal heart sounds. No murmur heard. Pulmonary:     Effort: Pulmonary effort is normal.     Breath sounds: Normal breath sounds. No rales.  Musculoskeletal:     Right lower leg: No edema.     Left lower leg: No edema.  Skin:    General: Skin is warm and dry.  Neurological:     Mental Status: He is alert and oriented to person, place, and time.  Psychiatric:        Mood and Affect: Mood normal.    44 minutes spent during visit, including chart review, study review including PFTs, recent ER visit review, GI eval, counseling and assimilation of information, exam, discussion of plan, and chart completion.   Assessment & Plan:  Deaaron Fulghum is a 63 y.o. male . Situational anxiety  Bloating  Abnormality of breathing  Dyspnea, unspecified type  OSA (obstructive sleep apnea)  Encouraging repeat pulmonary function testing.  Still intermittent episodes of feeling like he cannot take a complete breath.  Has been evaluated multiple times through ER with normal troponins, less likely cardiac source.  Reassuring recent pulmonary function testing, less likely pulmonary cause.  Hiatal hernia but would not expect significant symptoms at that size.  Plan for sleep apnea treatment, they could contribute to nighttime symptoms but should not contribute to daytime symptoms.  Differential still includes some component of anxiety, and I think it is worth trying a low-dose of SSRI, initially to make sure he tolerates it then recheck within a month for possible higher dosing if minimal  change.  Keep follow-up with specialists  as planned with RTC precautions given.  Meds ordered this encounter  Medications   sertraline (ZOLOFT) 25 MG tablet    Sig: Take 1 tablet (25 mg total) by mouth daily.  Dispense:  30 tablet    Refill:  3   Patient Instructions  Thanks for coming today.  Hopefully starting CPAP will help with some of your nighttime symptoms.  Stay on twice per day proton pump inhibitor to help with heartburn and keep follow-up with gastroenterology as planned.  Gastrointestinal issues could be contributing to some of your symptoms as we discussed as well.  However I think it would be reasonable to try low-dose sertraline to see if some component of anxiety may also be present.  Still okay to use alprazolam if you do have a breakthrough of anxiety symptoms but sertraline should lessen that.  That can take a few weeks for that to start working, so I would like to follow-up with you in 1 month and see how you are doing and we can discuss options of higher dosing at that time if needed.  Take care!  Return to the clinic or go to the nearest emergency room if any of your symptoms worsen or new symptoms occur.     Signed,   Meredith Staggers, MD [facility], [facility] 06/29/23 11:12 AM

## 2023-06-29 NOTE — Patient Instructions (Signed)
 Thanks for coming today.  Hopefully starting CPAP will help with some of your nighttime symptoms.  Stay on twice per day proton pump inhibitor to help with heartburn and keep follow-up with gastroenterology as planned.  Gastrointestinal issues could be contributing to some of your symptoms as we discussed as well.  However I think it would be reasonable to try low-dose sertraline to see if some component of anxiety may also be present.  Still okay to use alprazolam if you do have a breakthrough of anxiety symptoms but sertraline should lessen that.  That can take a few weeks for that to start working, so I would like to follow-up with you in 1 month and see how you are doing and we can discuss options of higher dosing at that time if needed.  Take care!  Return to the clinic or go to the nearest emergency room if any of your symptoms worsen or new symptoms occur.

## 2023-06-29 NOTE — Telephone Encounter (Signed)
 Pharmacy Patient Advocate Encounter   Received notification from Physician's Office that prior authorization for REPATHA is required/requested.   Insurance verification completed.   The patient is insured through [facility] .   Per test claim: PA required; PA submitted to above mentioned insurance via CoverMyMeds Key/confirmation #/EOC [id number] Status is pending

## 2023-06-30 ENCOUNTER — Encounter: Payer: Self-pay | Admitting: Family Medicine

## 2023-06-30 NOTE — Telephone Encounter (Signed)
 Pharmacy Patient Advocate Encounter  Received notification from [facility] that Prior Authorization for REPATHA has been DENIED.  Full denial letter will be uploaded to the media tab. See denial reason below. PLAN REQUIRES TRIAL AND FAILURE WITH ZETIA

## 2023-07-01 ENCOUNTER — Encounter: Payer: Self-pay | Admitting: Surgery

## 2023-07-01 ENCOUNTER — Ambulatory Visit: Payer: Self-pay | Admitting: Surgery

## 2023-07-01 VITALS — BP 135/87 | HR 70 | Ht 66.0 in | Wt 175.0 lb

## 2023-07-01 DIAGNOSIS — K219 Gastro-esophageal reflux disease without esophagitis: Secondary | ICD-10-CM

## 2023-07-01 DIAGNOSIS — K449 Diaphragmatic hernia without obstruction or gangrene: Secondary | ICD-10-CM

## 2023-07-01 NOTE — Patient Instructions (Addendum)
 We will get you scheduled for a Barium swallow to look at your swallowing function.  Do have your Gastric Emptying study completed. This will also help [place].   Follow up here in 3-4 weeks after we get the results from these studies.  You are scheduled for a Barium Swallow study at [facility] on 07/14/23. You will need to arrive at the main entrance and check in at 8:45 am. You may not have anything to eat or drink after 6 am that morning.   Advised to pursue a goal of 25 to 30 g of fiber daily.  The majority of this may be through natural sources, advised to ensure a minimal daily fiber supplementation.  Various forms of supplements discussed.  Recommend Psyllium husk(Metamucil), with options of mixing with beverage or applesauce to make more tolerable. Strongly advised to consume more fluids(especially in proximity to fiber intake) and to ensure adequate hydration.   Watch color of urine to determine adequacy of hydration.  Clarity is pursued in urine output, and bowel activity that responds and corresponds to significant meal intake.   We need to avoid deferring having bowel movements, advised to take the time at the first sign of sensation, typically following meals, and in the morning.  The need to avoid more frequently, and the presence of flatus may indicate the need for bowel movement.  Do not defer for later.  Addition of MiraLAX (or its generic equivalent) may be needed ensure at least twice daily bowel movements.  If multiple doses of MiraLAX are necessary utilize them. Never skip a day... Do not tolerate a day without a bowel movement unless you are fasting.  To be regular, we must do the above EVERY day.   Soluble Fiber Dissolves in Water: Soluble fiber dissolves in water to form a gel-like substance. Slows Digestion: This type of fiber slows down digestion, which can help control blood sugar levels and lower cholesterol. Sources: Common sources include oats, beans, apples,  citrus fruits, and psyllium. Benefits: Helps manage cholesterol levels. Aids in blood sugar control. Increases healthy gut bacteria, which can lower inflammation and improve digestion.  Insoluble Fiber Does Not Dissolve in Water: Insoluble fiber does not dissolve in water and remains mostly intact as it passes through the digestive system. Adds Bulk to Stool: It adds bulk to stool, which helps promote regular bowel movements and prevent constipation. Sources: Common sources include whole grains, nuts, beans, and vegetables like cauliflower and potatoes. Benefits: Improves bowel health and regularity. Reduces the risk of colorectal conditions like hemorrhoids and diverticulitis. Supports insulin sensitivity in people with diabetes.  Both types of fiber are essential for overall health, and it's beneficial to include a 50/50 mix of both in your diet.

## 2023-07-01 NOTE — Telephone Encounter (Signed)
Routed to [facility] MD

## 2023-07-03 ENCOUNTER — Telehealth: Payer: Self-pay | Admitting: Cardiovascular Disease

## 2023-07-03 NOTE — Progress Notes (Signed)
 Patient ID: Todd [name], male   DOB: 24-Oct-1960, 63 y.o.   MRN: [id number]  HPI Todd [name] is a 63 y.o. male seen in consultation at the request of Dr. Belia Heman for hiatal hernia and significant pulmonary symptoms.  Over the last few months had significant issues with catching his breath.  Seems to have some dyspnea that is intermittent.  He has undergone significant pulmonary workup that were repeated because the results were inconsistent.  He was evaluated by pulmonary medicine and there was no evidence of obstructive or restrictive lung disease.  He did show a decent exercise capacity.  He does have mild sleep apnea and they recommended CPAP. He does endorse some intermittent reflux that has improved with PPI.  He denies any dysphagia.  He did have upper endoscopy that I have personally reviewed showing a 3 cm hiatal hernia he did have gastritis w antral erythema CAD w NON STEMI w LAD Stent DES 06/2021 on asa, recently saw cards w/o acute coronary issues CT of the chest that I have also personally reviewed showing may be a small sliding hiatal hernia  He is originally from [place].  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    HPI  Past Medical History:  Diagnosis Date   CAD (coronary artery disease)    Complicated migraine    Deafness in left ear    Diverticulosis    External hemorrhoids    Headache    Hypertension    Internal hemorrhoids    Palpitations    Stroke [facility])    TIA (transient ischemic attack)    Tubular adenoma of colon    Vision abnormalities     Past Surgical History:  Procedure Laterality Date   APPENDECTOMY  12/01/11   lap appy   CORONARY STENT INTERVENTION N/A 07/17/2021   Procedure: CORONARY STENT INTERVENTION;  Surgeon: Kathleene Hazel, MD;  Location: [facility] INVASIVE CV LAB;  Service: Cardiovascular;  Laterality: N/A;   LAPAROSCOPIC APPENDECTOMY  12/02/2011   Procedure: APPENDECTOMY LAPAROSCOPIC;  Surgeon: Lodema Pilot, DO;  Location: [facility] ORS;  Service:  General;  Laterality: N/A;   LEFT HEART CATH AND CORONARY ANGIOGRAPHY N/A 07/17/2021   Procedure: LEFT HEART CATH AND CORONARY ANGIOGRAPHY;  Surgeon: Kathleene Hazel, MD;  Location: [facility] INVASIVE CV LAB;  Service: Cardiovascular;  Laterality: N/A;    Family History  Problem Relation Age of Onset   Hypertension Mother    Hypertension Father    Anemia Neg Hx    Arrhythmia Neg Hx    Asthma Neg Hx    Clotting disorder Neg Hx    Fainting Neg Hx    Heart attack Neg Hx    Heart disease Neg Hx    Heart failure Neg Hx    Hyperlipidemia Neg Hx    Colon cancer Neg Hx    Esophageal cancer Neg Hx    Stomach cancer Neg Hx    Rectal cancer Neg Hx    Colon polyps Neg Hx     Social History Social History   Tobacco Use   Smoking status: Never    Passive exposure: Never   Smokeless tobacco: Never  Vaping Use   Vaping status: Never Used  Substance Use Topics   Alcohol use: No   Drug use: No    Allergies  Allergen Reactions   Wellbutrin [Bupropion Hcl] Itching and Rash    Current Outpatient Medications  Medication Sig Dispense Refill   aspirin EC 81 MG tablet Take 81 mg by mouth  daily. Swallow whole.     metoprolol succinate (TOPROL XL) 25 MG 24 hr tablet Take 1 tablet (25 mg total) by mouth daily. 90 tablet 3   pantoprazole (PROTONIX) 40 MG tablet Take 1 tablet (40 mg total) by mouth 2 (two) times daily. 60 tablet 1   rosuvastatin (CRESTOR) 20 MG tablet TAKE 1 TABLET BY MOUTH EVERY DAY 30 tablet 5   sertraline (ZOLOFT) 25 MG tablet Take 1 tablet (25 mg total) by mouth daily. (Patient not taking: Reported on 07/01/2023) 30 tablet 3   No current facility-administered medications for this visit.     Review of Systems Full ROS  was asked and was negative except for the information on the HPI  Physical Exam Blood pressure 135/87, pulse 70, height 5\' 6"  (1.676 m), weight 175 lb (79.4 kg), SpO2 97%. CONSTITUTIONAL: NAD EYES: Pupils are equal, round,  Sclera are  non-icteric. EARS, NOSE, MOUTH AND THROAT: The oropharynx is clear. The oral mucosa is pink and moist. Hearing is intact to voice. LYMPH NODES:  Lymph nodes in the neck are normal. RESPIRATORY:  Lungs are clear. There is normal respiratory effort, with equal breath sounds bilaterally, and without pathologic use of accessory muscles. CARDIOVASCULAR: Heart is regular without murmurs, gallops, or rubs. GI: The abdomen is  soft, nontender, and nondistended. There are no palpable masses. There is no hepatosplenomegaly. There are normal bowel sounds in all quadrants. GU: Rectal deferred.   MUSCULOSKELETAL: Normal muscle strength and tone. No cyanosis or edema.   SKIN: Turgor is good and there are no pathologic skin lesions or ulcers. NEUROLOGIC: Motor and sensation is grossly normal. Cranial nerves are grossly intact. PSYCH:  Oriented to person, place and time. Affect is normal.  Data Reviewed  I have personally reviewed the patient's imaging, laboratory findings and medical records.    Assessment/Plan 63 year old male with significant pulmonary issues and hiatal hernia with atypical reflux symptoms.  I had a good discussion with the patient regarding his disease process.  He certainly has a hiatal hernia with some changes and atypical reflux symptoms.  I do think will be worth pursuing barium swallow to correct arise the esophageal anatomy.  I was very candid with the patient regarding surgical intervention for correction of hiatal hernia.  Although this is an excellent procedure for reflux control I could not guarantee that his pulmonary symptoms are 100% related to reflux and microaspiration.  I did tell her mom that certainly some people can experience this atypical pulmonary symptoms that will improve with fundoplication and correction of the hiatal hernia.  He seems to understand.  He wishes to continue further workup.   We briefly discussed what repair of paraesophageal hernia will entail and the  typical robotic approach. Please note that I spent 60 minutes in this encounter including extensive review of medical records, reviewing personally imaging studies, coordinating his care and performing appropriate documentation. A copy of the report was sent to the referring provider  Sterling Big, MD FACS General Surgeon 07/03/2023, 10:09 AM

## 2023-07-03 NOTE — Telephone Encounter (Signed)
 Order for CPAP was sent to [facility] on 03/27 per phone note on 03/27. Patient has still not heard from [facility] and does not have his machine. He leaves to go out of the country for 2 months in 10 days. Please advise.

## 2023-07-03 NOTE — Telephone Encounter (Signed)
 Patient following up on medication that was to be sent in, please advise. He leaves to go out of the country in 10 days and will be gone for 2 months.

## 2023-07-03 NOTE — Telephone Encounter (Signed)
 Left message for patient to return the call.

## 2023-07-06 ENCOUNTER — Telehealth: Payer: Self-pay | Admitting: Internal Medicine

## 2023-07-06 ENCOUNTER — Other Ambulatory Visit (HOSPITAL_COMMUNITY): Payer: Self-pay

## 2023-07-06 ENCOUNTER — Telehealth: Payer: Self-pay | Admitting: Pharmacy Technician

## 2023-07-06 DIAGNOSIS — I2510 Atherosclerotic heart disease of native coronary artery without angina pectoris: Secondary | ICD-10-CM

## 2023-07-06 DIAGNOSIS — E785 Hyperlipidemia, unspecified: Secondary | ICD-10-CM

## 2023-07-06 MED ORDER — EZETIMIBE 10 MG PO TABS: 10.0000 mg | ORAL_TABLET | Freq: Every day | ORAL | 3 refills | Status: DC

## 2023-07-06 MED ORDER — REPATHA SURECLICK 140 MG/ML ~~LOC~~ SOAJ: 1.0000 mL | SUBCUTANEOUS | Status: DC

## 2023-07-06 NOTE — Telephone Encounter (Signed)
 Spoke with patient about recent labs and meds. Explained that LDL is 78 on crestor 20 and adding zetia will get him to target. Explained that Hilty MD hoped for Repatha as this can potentially lower LPa but his insurance has specific criteria for Repatha approved and trying zetia first is one of them. Explained that Repatha does not have an FDA indication to lower LPa so unable to really use that has justification. He voiced understanding. [facility]. No further assistance needed.

## 2023-07-06 NOTE — Telephone Encounter (Signed)
 Gave patient 1 month of Repatha samples and recommended he pick up the Zetia. Can resubmit PA after he picks up

## 2023-07-06 NOTE — Telephone Encounter (Signed)
 This pt is here wanting to speak to a nurse regarding his medication and alternative for cpap machine. I have sent a message to out NL triage teams chat for someone to come speak with patient.

## 2023-07-06 NOTE — Telephone Encounter (Signed)
 Hazle Lites, MD  You4 days ago    Zetia is so much less helpful in this situation, however, since Repatha won't be covered, would add zetio 10 mg daily - repeat lipid in 3 months  Dr Charise Companion MyChart message to patient with this update. Rx(s) sent to pharmacy electronically. Lab work ordered - reminder will be sent.

## 2023-07-06 NOTE — Telephone Encounter (Signed)
 Notified patient that CPAP order was denied by 2 DME's and is currently being processed by [facility]. Number to [facility] given to patient. All questions were answered about CPAP issues and patient verbalized understanding. Staff explained to patient that his lab results means he is at risk for another blockage without therapy. Provider advises Zetia for Therapy and message sent to covering nurse [facility] [address]. Patient wishes to start therapy with Zetia before his 30-day trip out of the country.

## 2023-07-06 NOTE — Telephone Encounter (Signed)
 can you give me a price quote cash price on 30 days of repatha   MRN:  [id number]   repatha - 643.85 cash

## 2023-07-06 NOTE — Addendum Note (Signed)
 Addended by: Sunny English on: 07/06/2023 03:57 PM   Modules accepted: Orders

## 2023-07-06 NOTE — Telephone Encounter (Signed)
 Routed to PA team to see if appeal can be submitted See 07/06/23 telephone note

## 2023-07-06 NOTE — Telephone Encounter (Signed)
 Patient walked in again and is willing to pay cash price for Repatha because he is scared he will have a stroke due to his LPA. Advised I could give him samples of Repatha. Recommended he pick up the ezetimbe that was sent earlier and we can attempt a PA again.

## 2023-07-07 ENCOUNTER — Other Ambulatory Visit: Payer: Self-pay | Admitting: Internal Medicine

## 2023-07-07 DIAGNOSIS — R140 Abdominal distension (gaseous): Secondary | ICD-10-CM

## 2023-07-07 MED ORDER — PANTOPRAZOLE SODIUM 40 MG PO TBEC: 40.0000 mg | DELAYED_RELEASE_TABLET | Freq: Two times a day (BID) | ORAL | 11 refills | Status: DC

## 2023-07-07 NOTE — Telephone Encounter (Signed)
 Copied from CRM [id number]. Topic: Clinical - Medication Refill >> Jul 07, 2023  2:00 PM Hilton Lucky wrote: Most Recent Primary Care Visit:  Provider: Caro Christmas R  Department: [facility]  Visit Type: OFFICE VISIT  Date: 06/29/2023  Medication: pantoprazole (PROTONIX) 40 MG tablet  Has the patient contacted their pharmacy? Yes - Advised that our system lists his GI provider as the one authorizing the Rx but patient insists Dr. Auston Left is the only one who has and will ever fill it.   Is this the correct pharmacy for this prescription? Yes If no, delete pharmacy and type the correct one. This is the patient's preferred pharmacy:  [id number] [place], [place] - [address] [place] [address] Phone: [phone] Fax: [phone]   Has the prescription been filled recently? Yes - 06/11/23  Is the patient out of the medication? Yes  Has the patient been seen for an appointment in the last year OR does the patient have an upcoming appointment? Yes  Can we respond through MyChart? Yes  Agent: Please be advised that Rx refills may take up to 3 business days. We ask that you follow-up with your pharmacy.

## 2023-07-07 NOTE — Telephone Encounter (Signed)
 Rx refill request has been approved per providers most recent note on the pt.

## 2023-07-08 ENCOUNTER — Ambulatory Visit: Admitting: Family Medicine

## 2023-07-08 ENCOUNTER — Encounter (HOSPITAL_COMMUNITY)
Admission: RE | Admit: 2023-07-08 | Discharge: 2023-07-08 | Disposition: A | Discharge status: Home / Self Care | Source: Ambulatory Visit | Attending: Physician Assistant | Admitting: Physician Assistant

## 2023-07-08 DIAGNOSIS — R140 Abdominal distension (gaseous): Secondary | ICD-10-CM | POA: Diagnosis present

## 2023-07-08 MED ORDER — TECHNETIUM TC 99M SULFUR COLLOID
2.2000 | Freq: Once | INTRAVENOUS | Status: AC | PRN
  Administered 2023-07-08: 2.2 via ORAL

## 2023-07-11 ENCOUNTER — Emergency Department (HOSPITAL_COMMUNITY)

## 2023-07-11 ENCOUNTER — Emergency Department (HOSPITAL_COMMUNITY)
Admission: EM | Admit: 2023-07-11 | Discharge: 2023-07-11 | Disposition: A | Discharge status: Home / Self Care | Attending: Emergency Medicine | Admitting: Emergency Medicine

## 2023-07-11 ENCOUNTER — Other Ambulatory Visit: Payer: Self-pay

## 2023-07-11 ENCOUNTER — Encounter (HOSPITAL_COMMUNITY): Payer: Self-pay

## 2023-07-11 DIAGNOSIS — R0789 Other chest pain: Secondary | ICD-10-CM | POA: Diagnosis present

## 2023-07-11 DIAGNOSIS — R0602 Shortness of breath: Secondary | ICD-10-CM | POA: Insufficient documentation

## 2023-07-11 DIAGNOSIS — I10 Essential (primary) hypertension: Secondary | ICD-10-CM | POA: Insufficient documentation

## 2023-07-11 DIAGNOSIS — K449 Diaphragmatic hernia without obstruction or gangrene: Secondary | ICD-10-CM | POA: Insufficient documentation

## 2023-07-11 DIAGNOSIS — I2510 Atherosclerotic heart disease of native coronary artery without angina pectoris: Secondary | ICD-10-CM | POA: Diagnosis not present

## 2023-07-11 DIAGNOSIS — Z8673 Personal history of transient ischemic attack (TIA), and cerebral infarction without residual deficits: Secondary | ICD-10-CM | POA: Insufficient documentation

## 2023-07-11 DIAGNOSIS — Z7982 Long term (current) use of aspirin: Secondary | ICD-10-CM | POA: Insufficient documentation

## 2023-07-11 LAB — RESP PANEL BY RT-PCR (RSV, FLU A&B, COVID)  RVPGX2
Influenza A by PCR: NEGATIVE
Influenza B by PCR: NEGATIVE
Resp Syncytial Virus by PCR: NEGATIVE
SARS Coronavirus 2 by RT PCR: NEGATIVE

## 2023-07-11 LAB — CBC WITH DIFFERENTIAL/PLATELET
Abs Immature Granulocytes: 0.05 10*3/uL (ref 0.00–0.07)
Basophils Absolute: 0.1 10*3/uL (ref 0.0–0.1)
Basophils Relative: 1 %
Eosinophils Absolute: 0.2 10*3/uL (ref 0.0–0.5)
Eosinophils Relative: 2 %
HCT: 45.1 % (ref 39.0–52.0)
Hemoglobin: 15.8 g/dL (ref 13.0–17.0)
Immature Granulocytes: 1 %
Lymphocytes Relative: 26 %
Lymphs Abs: 2.2 10*3/uL (ref 0.7–4.0)
MCH: 30.4 pg (ref 26.0–34.0)
MCHC: 35 g/dL (ref 30.0–36.0)
MCV: 86.9 fL (ref 80.0–100.0)
Monocytes Absolute: 0.9 10*3/uL (ref 0.1–1.0)
Monocytes Relative: 10 %
Neutro Abs: 5.1 10*3/uL (ref 1.7–7.7)
Neutrophils Relative %: 60 %
Platelets: 219 10*3/uL (ref 150–400)
RBC: 5.19 MIL/uL (ref 4.22–5.81)
RDW: 12 % (ref 11.5–15.5)
WBC: 8.5 10*3/uL (ref 4.0–10.5)
nRBC: 0 % (ref 0.0–0.2)

## 2023-07-11 LAB — COMPREHENSIVE METABOLIC PANEL WITH GFR
ALT: 26 U/L (ref 0–44)
AST: 30 U/L (ref 15–41)
Albumin: 4.4 g/dL (ref 3.5–5.0)
Alkaline Phosphatase: 58 U/L (ref 38–126)
Anion gap: 8 (ref 5–15)
BUN: 17 mg/dL (ref 8–23)
CO2: 23 mmol/L (ref 22–32)
Calcium: 9.2 mg/dL (ref 8.9–10.3)
Chloride: 107 mmol/L (ref 98–111)
Creatinine, Ser: 0.96 mg/dL (ref 0.61–1.24)
GFR, Estimated: >60 mL/min (ref >60)
Glucose, Bld: 98 mg/dL (ref 70–99)
Potassium: 3.4 mmol/L — ABNORMAL LOW (ref 3.5–5.1)
Sodium: 138 mmol/L (ref 135–145)
Total Bilirubin: 1.9 mg/dL — ABNORMAL HIGH (ref 0.0–1.2)
Total Protein: 7.6 g/dL (ref 6.5–8.1)

## 2023-07-11 LAB — TROPONIN I (HIGH SENSITIVITY)
Troponin I (High Sensitivity): 3 ng/L (ref <18)
Troponin I (High Sensitivity): 3 ng/L (ref <18)

## 2023-07-11 LAB — BRAIN NATRIURETIC PEPTIDE: B Natriuretic Peptide: 32.9 pg/mL (ref 0.0–100.0)

## 2023-07-11 MED ORDER — ALUM & MAG HYDROXIDE-SIMETH 200-200-20 MG/5ML PO SUSP
30.0000 mL | Freq: Once | ORAL | Status: AC
Start: 2023-07-11 — End: 2023-07-11
  Administered 2023-07-11: 30 mL via ORAL
  Filled 2023-07-11: qty 30

## 2023-07-11 MED ORDER — LIDOCAINE VISCOUS HCL 2 % MT SOLN
15.0000 mL | Freq: Once | OROMUCOSAL | Status: AC
  Administered 2023-07-11: 15 mL via ORAL
  Filled 2023-07-11: qty 15

## 2023-07-11 MED ORDER — FAMOTIDINE 20 MG PO TABS: 20.0000 mg | ORAL_TABLET | Freq: Every day | ORAL | 0 refills | Status: DC

## 2023-07-11 MED ORDER — IOHEXOL 350 MG/ML SOLN
100.0000 mL | Freq: Once | INTRAVENOUS | Status: AC | PRN
  Administered 2023-07-11: 100 mL via INTRAVENOUS

## 2023-07-11 NOTE — Discharge Instructions (Signed)
 Your EKG, chest x-ray, CT imaging and laboratory evaluation was overall reassuring.  Suspect likely symptoms due to your known hiatal hernia, continue to take Protonix , Pepcid  has been prescribed for short course, follow-up with your primary care provider to ensure resolution, return for any severe worsening symptoms.

## 2023-07-11 NOTE — ED Triage Notes (Signed)
 Pt reports SOB and difficulty taking a deep breath starting an hour ago. Feeling fine prior to this. Pain in left side of chest (tightness) radiating to left shoulder/arm starting about 40 minutes ago. Pt reports hx of cardiac stent placed 2 years ago. Associated headache and fatigue. Denies fever/congestion/cough/weakness.

## 2023-07-11 NOTE — ED Provider Notes (Signed)
[place] EMERGENCY DEPARTMENT AT [facility] Provider Note   CSN: [id number] Arrival date & time: 07/11/23  0102     History  Chief Complaint  Patient presents with   Chest Pain   Shortness of Breath    Todd [name] is a 63 y.o. male.   Chest Pain Associated symptoms: cough and shortness of breath   Associated symptoms: no fever   Shortness of Breath Associated symptoms: chest pain and cough   Associated symptoms: no fever      63 year old male with medical history significant for CVA/TIA, diverticulosis, CAD status post stent placement, hypertension presenting to the emergency department with a chief complaint of sudden onset shortness of breath, chest pain on the left side.  The patient states that he had sudden onset difficulty taking a deep breath, denies pleuritic pain, endorses left-sided chest tightness with radiation to the left shoulder and down the arm.  Symptoms started 40 minutes prior to arrival and have since resolved.  He did have a stent placed 2 years ago.  He denies any fever or chills.  He does endorse a dry cough.  His last echocardiogram in April 2023 revealed a normal EF.  A left heart catheterization in April 2023 revealed 80% stenosis in the mid LAD with successful stent placement.  Home Medications Prior to Admission medications   Medication Sig Start Date End Date Taking? Authorizing Provider  aspirin  EC 81 MG tablet Take 81 mg by mouth daily. Swallow whole.   Yes [provider]  Evolocumab  (REPATHA  SURECLICK) 140 MG/ML SOAJ Inject 140 mg into the skin every 14 (fourteen) days. 07/06/23  Yes Hilty, Aviva Lemmings, MD  ezetimibe  (ZETIA ) 10 MG tablet Take 1 tablet (10 mg total) by mouth daily. 07/06/23 10/04/23 Yes Hilty, Aviva Lemmings, MD  famotidine  (PEPCID ) 20 MG tablet Take 1 tablet (20 mg total) by mouth daily. 07/11/23  Yes Rosealee Concha, MD  metoprolol  succinate (TOPROL  XL) 25 MG 24 hr tablet Take 1 tablet (25 mg total) by mouth  daily. 07/14/22  Yes Hilty, Aviva Lemmings, MD  pantoprazole  (PROTONIX ) 40 MG tablet Take 1 tablet (40 mg total) by mouth 2 (two) times daily. 07/07/23 07/06/24 Yes Kasa, Kurian, MD  rosuvastatin  (CRESTOR ) 20 MG tablet TAKE 1 TABLET BY MOUTH EVERY DAY 01/05/23  Yes Hilty, Aviva Lemmings, MD  sertraline  (ZOLOFT ) 25 MG tablet Take 1 tablet (25 mg total) by mouth daily. Patient not taking: Reported on 07/01/2023 06/29/23   Benjiman Bras, MD      Allergies    Wellbutrin [bupropion hcl]    Review of Systems   Review of Systems  Constitutional:  Negative for fever.  Respiratory:  Positive for cough and shortness of breath.   Cardiovascular:  Positive for chest pain.  All other systems reviewed and are negative.   Physical Exam Updated Vital Signs BP 123/74   Pulse 67   Temp 97.8 F (36.6 C) (Oral)   Resp 19   Ht 5\' 6"  (1.676 m)   Wt 77.6 kg   SpO2 96%   BMI 27.60 kg/m  Physical Exam Vitals and nursing note reviewed.  Constitutional:      General: He is not in acute distress.    Appearance: He is well-developed.  HENT:     Head: Normocephalic and atraumatic.  Eyes:     Conjunctiva/sclera: Conjunctivae normal.  Cardiovascular:     Rate and Rhythm: Normal rate and regular rhythm.     Heart sounds: No murmur  heard. Pulmonary:     Effort: Pulmonary effort is normal. No respiratory distress.     Breath sounds: Normal breath sounds.  Abdominal:     Palpations: Abdomen is soft.     Tenderness: There is no abdominal tenderness.  Musculoskeletal:        General: No swelling.     Cervical back: Neck supple.  Skin:    General: Skin is warm and dry.     Capillary Refill: Capillary refill takes less than 2 seconds.  Neurological:     Mental Status: He is alert.  Psychiatric:        Mood and Affect: Mood normal.     ED Results / Procedures / Treatments   Labs (all labs ordered are listed, but only abnormal results are displayed) Labs Reviewed  COMPREHENSIVE METABOLIC PANEL WITH GFR -  Abnormal; Notable for the following components:      Result Value   Potassium 3.4 (*)    Total Bilirubin 1.9 (*)    All other components within normal limits  RESP PANEL BY RT-PCR (RSV, FLU A&B, COVID)  [id number]  CBC WITH DIFFERENTIAL/PLATELET  BRAIN NATRIURETIC PEPTIDE  TROPONIN I (HIGH SENSITIVITY)  TROPONIN I (HIGH SENSITIVITY)    EKG EKG Interpretation Date/Time:  Saturday July 11 2023 01:13:45 EDT Ventricular Rate:  60 PR Interval:  148 QRS Duration:  102 QT Interval:  425 QTC Calculation: 425 R Axis:   68  Text Interpretation: Sinus rhythm Borderline low voltage, extremity leads Confirmed by Rosealee Concha ([id number]) on 07/11/2023 2:09:03 AM  Radiology CT Angio Chest PE W and/or Wo Contrast Result Date: 07/11/2023 CLINICAL DATA:  Suspected pulmonary embolism. EXAM: CT ANGIOGRAPHY CHEST WITH CONTRAST TECHNIQUE: Multidetector CT imaging of the chest was performed using the standard protocol during bolus administration of intravenous contrast. Multiplanar CT image reconstructions and MIPs were obtained to evaluate the vascular anatomy. RADIATION DOSE REDUCTION: This exam was performed according to the departmental dose-optimization program which includes automated exposure control, adjustment of the mA and/or kV according to patient size and/or use of iterative reconstruction technique. CONTRAST:  OMNIPAQUE  IOHEXOL  350 MG/ML SOLN COMPARISON:  June 08, 2023 FINDINGS: Cardiovascular: There is mild calcification of the aortic arch, without evidence of aortic aneurysm. Satisfactory opacification of the pulmonary arteries to the segmental level. No evidence of pulmonary embolism. Normal heart size with mild coronary artery calcification. A coronary artery stent is noted within the LAD. No pericardial effusion. Mediastinum/Nodes: No enlarged mediastinal, hilar, or axillary lymph nodes. Thyroid  gland, trachea, and esophagus demonstrate no significant findings. Lungs/Pleura: Lungs are clear. No  pleural effusion or pneumothorax. Upper Abdomen: No acute abnormality. Musculoskeletal: No chest wall abnormality. No acute or significant osseous findings. Review of the MIP images confirms the above findings. IMPRESSION: 1. No evidence of acute pulmonary embolism. 2. Coronary artery stent, as described above. 3. No acute cardiopulmonary disease. Electronically Signed   By: Virgle Grime M.D.   On: 07/11/2023 03:40   DG Chest Port 1 View Result Date: 07/11/2023 CLINICAL DATA:  Shortness of breath with difficulty taking a deep breath. EXAM: PORTABLE CHEST 1 VIEW COMPARISON:  June 25, 2023 FINDINGS: The heart size and mediastinal contours are within normal limits. Both lungs are clear. The visualized skeletal structures are unremarkable. IMPRESSION: No active disease. Electronically Signed   By: Virgle Grime M.D.   On: 07/11/2023 02:03    Procedures Procedures    Medications Ordered in ED Medications  iohexol  (OMNIPAQUE ) 350 MG/ML injection 100 mL (100  mLs Intravenous Contrast Given 07/11/23 0316)  alum & mag hydroxide-simeth (MAALOX/MYLANTA) 200-200-20 MG/5ML suspension 30 mL (30 mLs Oral Given 07/11/23 0510)    And  lidocaine  (XYLOCAINE ) 2 % viscous mouth solution 15 mL (15 mLs Oral Given 07/11/23 0510)    ED Course/ Medical Decision Making/ A&P                                 Medical Decision Making Amount and/or Complexity of Data Reviewed Labs: ordered. Radiology: ordered.  Risk OTC drugs. Prescription drug management.     63 year old male with medical history significant for CVA/TIA, diverticulosis, CAD status post stent placement, hypertension presenting to the emergency department with a chief complaint of sudden onset shortness of breath, chest pain on the left side.  The patient states that he had sudden onset difficulty taking a deep breath, denies pleuritic pain, endorses left-sided chest tightness with radiation to the left shoulder and down the arm.  Symptoms  started 40 minutes prior to arrival and have since resolved.  He did have a stent placed 2 years ago.  He denies any fever or chills.  He does endorse a dry cough.  His last echocardiogram in April 2023 revealed a normal EF.  A left heart catheterization in April 2023 revealed 80% stenosis in the mid LAD with successful stent placement.    Medical Decision Making: Wayburn Shaler is a 63 y.o. male who presented to the ED today with chest pain, detailed above.   Complete initial physical exam performed, notably the patient was CTAB, no LE edema.   Reviewed and confirmed nursing documentation for past medical history, family history, social history.    Initial Assessment: With the patient's presentation of left-sided chest pain, most likely diagnosis is musculoskeletal chest pain versus GERD, although ACS remains on the differential. Other diagnoses were considered including (but not limited to) pulmonary embolism, community-acquired pneumonia, aortic dissection, pneumothorax, underlying bony abnormality, anemia. These are considered less likely due to history of present illness and physical exam findings.    In particular, concerning pulmonary embolism: will obtain CTA PE study Aortic Dissection also considered but seems less likely based on the location, quality, onset, and severity of symptoms in this case.   Initial Plan: Evaluate for ACS with delta troponin and EKG evaluated as below  Evaluate for dissection, bony abnormality, or pneumonia with chest x-ray and screening laboratory evaluation including CBC, BMP  Further evaluation for pulmonary embolism indicated at this time based on patient's PERC and Wells score.  Further evaluation for Thoracic Aortic Dissection not indicated at this time based on patient's clinical history and PE findings.   Initial Study Results: EKG was reviewed independently. Rate, rhythm, axis, intervals all examined and without medically relevant abnormality. ST  segments without concerns for elevations.    Laboratory  Delta troponin demonstrated normal values BNP normal  Covid/Flu negative CBC and CMP without obvious metabolic or inflammatory abnormalities requiring further evaluation   Radiology  CT Angio Chest PE W and/or Wo Contrast Result Date: 07/11/2023 CLINICAL DATA:  Suspected pulmonary embolism. EXAM: CT ANGIOGRAPHY CHEST WITH CONTRAST TECHNIQUE: Multidetector CT imaging of the chest was performed using the standard protocol during bolus administration of intravenous contrast. Multiplanar CT image reconstructions and MIPs were obtained to evaluate the vascular anatomy. RADIATION DOSE REDUCTION: This exam was performed according to the departmental dose-optimization program which includes automated exposure control, adjustment of the mA and/or  kV according to patient size and/or use of iterative reconstruction technique. CONTRAST:  OMNIPAQUE  IOHEXOL  350 MG/ML SOLN COMPARISON:  June 08, 2023 FINDINGS: Cardiovascular: There is mild calcification of the aortic arch, without evidence of aortic aneurysm. Satisfactory opacification of the pulmonary arteries to the segmental level. No evidence of pulmonary embolism. Normal heart size with mild coronary artery calcification. A coronary artery stent is noted within the LAD. No pericardial effusion. Mediastinum/Nodes: No enlarged mediastinal, hilar, or axillary lymph nodes. Thyroid  gland, trachea, and esophagus demonstrate no significant findings. Lungs/Pleura: Lungs are clear. No pleural effusion or pneumothorax. Upper Abdomen: No acute abnormality. Musculoskeletal: No chest wall abnormality. No acute or significant osseous findings. Review of the MIP images confirms the above findings. IMPRESSION: 1. No evidence of acute pulmonary embolism. 2. Coronary artery stent, as described above. 3. No acute cardiopulmonary disease. Electronically Signed   By: Virgle Grime M.D.   On: 07/11/2023 03:40   DG Chest  Port 1 View Result Date: 07/11/2023 CLINICAL DATA:  Shortness of breath with difficulty taking a deep breath. EXAM: PORTABLE CHEST 1 VIEW COMPARISON:  June 25, 2023 FINDINGS: The heart size and mediastinal contours are within normal limits. Both lungs are clear. The visualized skeletal structures are unremarkable. IMPRESSION: No active disease. Electronically Signed   By: Virgle Grime M.D.   On: 07/11/2023 02:03   NM GASTRIC EMPTYING Result Date: 07/09/2023 CLINICAL DATA:  Abdominal bloating EXAM: NUCLEAR MEDICINE GASTRIC EMPTYING SCAN TECHNIQUE: After oral ingestion of radiolabeled meal, sequential abdominal images were obtained for 120 minutes. Residual percentage of activity remaining within the stomach was calculated at 60 and 120 minutes. RADIOPHARMACEUTICALS:  2.2 mCi Tc-43m sulfur  colloid in standardized meal COMPARISON:  None Available. FINDINGS: Expected location of the stomach in the left upper quadrant. Ingested meal empties the stomach gradually over the course of the study with 22% retention at 60 min and 5% retention at 120 min (normal retention less than 30% at a 120 min). IMPRESSION: Normal gastric emptying study. Electronically Signed   By: Worthy Heads M.D.   On: 07/09/2023 23:49   DG Chest 2 View Result Date: 06/25/2023 CLINICAL DATA:  Shortness of breath. EXAM: CHEST - 2 VIEW COMPARISON:  05/29/2023 FINDINGS: The lungs are clear without focal pneumonia, edema, pneumothorax or pleural effusion. Nodular density/densities projecting over the lungs are compatible with pads for telemetry leads. The cardiopericardial silhouette is within normal limits for size. No acute bony abnormality. IMPRESSION: No active cardiopulmonary disease. Electronically Signed   By: Donnal Fusi M.D.   On: 06/25/2023 05:43    Final Assessment and Plan: Patient with overall reassuring EKG, chest x-ray, CT evaluation for PE.  Symptoms could be due to hiatal hernia.  Patient was tolerating oral intake,  administered lidocaine  and Maalox.  CT imaging negative for PE, no other acute intrathoracic abnormality.  Delta troponins negative, labs overall reassuring.  Symptoms could be due to the patient's known hiatal hernia.  Feeling symptomatically improved after lidocaine  and Maalox.  Overall stable for discharge at this time, plan for outpatient follow-up, return precautions provided.   Final Clinical Impression(s) / ED Diagnoses Final diagnoses:  Atypical chest pain  Hiatal hernia    Rx / DC Orders ED Discharge Orders          Ordered    famotidine  (PEPCID ) 20 MG tablet  Daily        07/11/23 0546              Rosealee Concha, MD  07/11/23 0546  

## 2023-07-14 ENCOUNTER — Other Ambulatory Visit (HOSPITAL_COMMUNITY)

## 2023-07-15 ENCOUNTER — Other Ambulatory Visit: Payer: Self-pay | Admitting: Internal Medicine

## 2023-07-15 ENCOUNTER — Telehealth: Payer: Self-pay | Admitting: Cardiovascular Disease

## 2023-07-15 DIAGNOSIS — R0683 Snoring: Secondary | ICD-10-CM

## 2023-07-15 DIAGNOSIS — I2510 Atherosclerotic heart disease of native coronary artery without angina pectoris: Secondary | ICD-10-CM

## 2023-07-15 DIAGNOSIS — G4733 Obstructive sleep apnea (adult) (pediatric): Secondary | ICD-10-CM

## 2023-07-15 NOTE — Telephone Encounter (Signed)
 order cpap around a month ago didnt get it called number provided not in network with insurance

## 2023-07-16 ENCOUNTER — Other Ambulatory Visit (HOSPITAL_COMMUNITY): Payer: Self-pay

## 2023-07-16 ENCOUNTER — Telehealth: Payer: Self-pay

## 2023-07-16 DIAGNOSIS — E785 Hyperlipidemia, unspecified: Secondary | ICD-10-CM

## 2023-07-16 DIAGNOSIS — I2510 Atherosclerotic heart disease of native coronary artery without angina pectoris: Secondary | ICD-10-CM

## 2023-07-16 MED ORDER — REPATHA SURECLICK 140 MG/ML ~~LOC~~ SOAJ
140.0000 mg | SUBCUTANEOUS | 5 refills | Status: DC
  Filled 2023-07-16 – 2023-07-22 (×2): qty 2, 28d supply, fill #0
  Filled 2023-07-24 – 2023-08-05 (×2): qty 2, 28d supply, fill #1
  Filled 2023-09-11: qty 2, 28d supply, fill #2
  Filled 2023-10-08: qty 2, 28d supply, fill #3
  Filled 2023-11-24 – 2024-01-13 (×3): qty 2, 28d supply, fill #4
  Filled 2024-02-16: qty 2, 28d supply, fill #5

## 2023-07-16 NOTE — Telephone Encounter (Signed)
 Pharmacy Patient Advocate Encounter  Received notification from [facility] that Prior Authorization for REPATHA  has been APPROVED from 07/16/23 to 10/15/23. Ran test claim, Copay is $15. This test claim was processed through [facility]- copay amounts may vary at other pharmacies due to pharmacy/plan contracts, or as the patient moves through the different stages of their insurance plan.

## 2023-07-16 NOTE — Telephone Encounter (Signed)
 Pt is calling back to speak to the Sleep dept

## 2023-07-16 NOTE — Telephone Encounter (Signed)
 Pharmacy Patient Advocate Encounter   Received notification from Physician's Office that prior authorization for REPATHA  is required/requested.   Insurance verification completed.   The patient is insured through [facility] .   Per test claim: PA required; PA submitted to above mentioned insurance via CoverMyMeds Key/confirmation #/EOC [id number] Status is pending

## 2023-07-16 NOTE — Telephone Encounter (Signed)
 Per Pt Please call [employer] at [phone]. He is sure they take his insurance

## 2023-07-16 NOTE — Addendum Note (Signed)
 Addended by: Sunny English on: 07/16/2023 04:52 PM   Modules accepted: Orders

## 2023-07-20 NOTE — Telephone Encounter (Signed)
 Notified patient that order has been sent to [facility] for processing his CPAP order. Notified patient that Appeal for Repatha  has been approved and prescription is waiting for him at our pharmacy.

## 2023-07-21 ENCOUNTER — Other Ambulatory Visit (HOSPITAL_COMMUNITY): Payer: Self-pay

## 2023-07-22 ENCOUNTER — Other Ambulatory Visit (HOSPITAL_COMMUNITY): Payer: Self-pay

## 2023-07-23 NOTE — Telephone Encounter (Signed)
 Patient stopped by the office yesterday saying DME company dint have order for CPAP

## 2023-07-24 ENCOUNTER — Other Ambulatory Visit (HOSPITAL_COMMUNITY): Payer: Self-pay

## 2023-07-24 NOTE — Addendum Note (Signed)
 Addended by: Joslyn Nim on: 07/24/2023 04:01 PM   Modules accepted: Orders

## 2023-07-24 NOTE — Progress Notes (Signed)
 Addendum: Reviewed and agree with assessment and management plan. Asha Grumbine, Carie Caddy, MD

## 2023-07-27 ENCOUNTER — Encounter: Payer: Self-pay | Admitting: Family Medicine

## 2023-07-27 ENCOUNTER — Ambulatory Visit (INDEPENDENT_AMBULATORY_CARE_PROVIDER_SITE_OTHER): Admitting: Family Medicine

## 2023-07-27 VITALS — BP 102/68 | HR 54 | Temp 98.2°F | Ht 66.0 in | Wt 173.0 lb

## 2023-07-27 DIAGNOSIS — F418 Other specified anxiety disorders: Secondary | ICD-10-CM | POA: Diagnosis not present

## 2023-07-27 DIAGNOSIS — K449 Diaphragmatic hernia without obstruction or gangrene: Secondary | ICD-10-CM | POA: Diagnosis not present

## 2023-07-27 DIAGNOSIS — R0600 Dyspnea, unspecified: Secondary | ICD-10-CM | POA: Diagnosis not present

## 2023-07-27 DIAGNOSIS — R069 Unspecified abnormalities of breathing: Secondary | ICD-10-CM

## 2023-07-27 MED ORDER — ALPRAZOLAM 0.25 MG PO TABS
0.2500 mg | ORAL_TABLET | Freq: Two times a day (BID) | ORAL | 0 refills | Status: AC | PRN
Start: 2023-07-27 — End: ?

## 2023-07-27 NOTE — Progress Notes (Signed)
 Subjective:  Patient ID: Todd [name], male    DOB: 16-Jan-1961  Age: 63 y.o. MRN: [id number]  CC:  Chief Complaint  Patient presents with   Follow-up    Pt is here today for F/U Pt reports SOB today for 1 hr in am     HPI [facility] presents for   Dyspnea Recurrent issue, see prior notes and workup.  Previous PFTs had indicated severe obstruction and narrowing of airway, but then followed up with pulmonary on March 6, with repeat PFTs March 18, reportedly overall normal at that time without significant abnormality, and specifically no significant obstructive or restrictive disease.  CT chest March 17 without acute findings, mild coronary artery calcifications and LAD coronary stent.  Mild OSA on previous sleep study with AHI of 7.0.  Gastroenterology has also been following and he continues on pantoprazole  40 mg daily, MiraLAX  added for possible constipation and EGD March 20 with gastritis noted, hiatal hernia and erythematous duodenopathy.  Pantoprazole  was increased to twice daily for 4 weeks and avoidance of NSAIDs.  Reactive gastropathy, mild chronic gastritis on pathology, negative H. pylori stain.  Repeat GI visit April 2 with plan for SIBO testing and gastric emptying study.  He has undergone multiple ER visits for dyspnea with reassuring evaluations including normal cardiac enzymes, no evidence of edema or pneumothorax on imaging, unlikely ACS.  Possible anxiety component.  He has been followed by cardiology with plan for Repatha  and previously had alprazolam  prescribed if needed for anxiety symptoms.  Last visit with me 1 month ago, and we decided to try sertraline  for possible anxiety component, started on 25 mg daily.  Also plan for starting CPAP at that time.  He was again seen in ER on April 19 with atypical chest pain on the left side with sudden onset of shortness of breath.  Negative respiratory panel, normal troponin x 2 as well as BNP and CBC.  Borderline  potassium of 3.4 and bilirubin of 1.9.  CT angio chest with no evidence of acute pulmonary embolism.  No acute cardiopulmonary disease.  No active disease on chest x-ray.  Symptoms thought to be possibly due to his hiatal hernia.  Treated with lidocaine , Maalox, which per ER note did give some improvement..  Delta troponin was negative, labs were overall reassuring. He notes symptoms at that visit were same as prior episodes.   Of note there was a plan for Repatha  but initially had to be started on Zetia  plus Crestor  due to insurance criteria for Repatha  being approved. Also noted that Repatha  has been approved from April 24 through July 24 of this year - 1st dose few weeks ago - initially sample.   Note of normal gastric emptying study on April 21.  May 2 note from cardiology, difficulty with getting CPAP approved.  Order sent to [facility].   Another episode of dyspnea this am - feels like needs to take a deep breath - tried a few tiems and on 3rd try able to take a deep breath. Similar as prior flares, but milder, feels like 100% better right now.  Taking alprazolam  0.25mg  once per day every day  since ER visit. feels like making things calm. Feels like it helped today.  7 or 8 pills left.  Did not take sertraline  - thought it was same as xanax .  Taking PPI BID. Not sure if taking pepcid . No heartburn. Feels like protonix  BID working well.  Saw surgeon on 4/9 - plan for  barium swallow to evaluate esophageal anatomy. Some trouble swallowing pills. Discussed possible repair of hiatal hernia, but plan for testing above first. Some trouble with swallowing pills at times. Taking all meds - crushing meds at times.  Will be travelling to [place] in about 2 weeks, will be there 1 month, then plan for barium swallow when he returns.      History Patient Active Problem List   Diagnosis Date Noted   Tonsillar abscess 02/24/2023   Sepsis (HCC) 02/24/2023   Hyperbilirubinemia 02/24/2023   Essential  hypertension 02/24/2023   Hearing loss of left ear 06/02/2022   Chronic anticoagulation 08/26/2021   Internal hemorrhoid, bleeding 08/26/2021   Prolapsed internal hemorrhoids, grade 3 08/26/2021   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 coronary artery disease 08/26/2021   NSTEMI (non-ST elevated myocardial infarction) (HCC) 07/20/2021   CAD (coronary artery disease) 07/18/2021   Hyperlipidemia 07/18/2021   Unstable angina (HCC)    Non-recurrent acute serous otitis media of right ear 10/14/2018   Shortness of breath 11/12/2016   Chest pain 11/12/2016   Heart palpitations 10/20/2013   Situational anxiety 10/20/2013   Migraine with visual aura 08/24/2013   TIA (transient ischemic attack) 08/23/2013   Blurred vision 08/23/2013   Anomia 08/23/2013   Past Medical History:  Diagnosis Date   CAD (coronary artery disease)    Complicated migraine    Deafness in left ear    Diverticulosis    External hemorrhoids    Headache    Hypertension    Internal hemorrhoids    Palpitations    Stroke (HCC)    TIA (transient ischemic attack)    Tubular adenoma of colon    Vision abnormalities    Past Surgical History:  Procedure Laterality Date   APPENDECTOMY  12/01/11   lap appy   CORONARY STENT INTERVENTION N/A 07/17/2021   Procedure: CORONARY STENT INTERVENTION;  Surgeon: Odie Benne, MD;  Location: [facility] IN[facility];  Service: Cardiovascular;  Laterality: N/A;   LAPAROSCOPIC APPENDECTOMY  12/02/2011   Procedure: APPENDECTOMY LAPAROSCOPIC;  Surgeon: Evander Hills, DO;  Location: [facility] OR[facility];  Service: General;  Laterality: N/A;   LEFT HEART CATH AND CORONARY ANGIOGRAPHY N/A 07/17/2021   Procedure: LEFT HEART CATH AND CORONARY ANGIOGRAPHY;  Surgeon: Odie Benne, MD;  Location: [facility] CV [facility];  Service: Cardiovascular;  Laterality: N/A;   Allergies  Allergen Reactions   Wellbutrin [Bupropion Hcl] Itching and Rash   Prior to Admission  medications   Medication Sig Start Date End Date Taking? Authorizing Provider  aspirin  EC 81 MG tablet Take 81 mg by mouth daily. Swallow whole.   Yes [provider]  Evolocumab  (REPATHA  SURECLICK) 140 MG/ML SOAJ Inject 140 mg into the skin every 14 (fourteen) days. 07/16/23  Yes Hilty, Aviva Lemmings, MD  ezetimibe  (ZETIA ) 10 MG tablet Take 1 tablet (10 mg total) by mouth daily. 07/06/23 10/04/23 Yes Hilty, Aviva Lemmings, MD  famotidine  (PEPCID ) 20 MG tablet Take 1 tablet (20 mg total) by mouth daily. 07/11/23  Yes Rosealee Concha, MD  metoprolol  succinate (TOPROL  XL) 25 MG 24 hr tablet Take 1 tablet (25 mg total) by mouth daily. 07/14/22  Yes Hilty, Aviva Lemmings, MD  pantoprazole  (PROTONIX ) 40 MG tablet Take 1 tablet (40 mg total) by mouth 2 (two) times daily. 07/07/23 07/06/24 Yes Kasa, Kurian, MD  rosuvastatin  (CRESTOR ) 20 MG tablet TAKE 1 TABLET BY MOUTH EVERY DAY 07/16/23  Yes Hilty, Aviva Lemmings, MD  sertraline  (  ZOLOFT ) 25 MG tablet Take 1 tablet (25 mg total) by mouth daily. 06/29/23  Yes Benjiman Bras, MD   Social History   Socioeconomic History   Marital status: Divorced    Spouse name: Not on file   Number of children: Not on file   Years of education: Not on file   Highest education level: Not on file  Occupational History   Occupation: part time  Tobacco Use   Smoking status: Never    Passive exposure: Never   Smokeless tobacco: Never  Vaping Use   Vaping status: Never Used  Substance and Sexual Activity   Alcohol use: No   Drug use: No   Sexual activity: Not Currently  Other Topics Concern   Not on file  Social History Narrative   Lives alone   Right Handed   Drinks very little caffeine   Social Drivers of Health   Financial Resource Strain: Not on file  Food Insecurity: No Food Insecurity (02/26/2023)   Hunger Vital Sign    Worried About Running Out of Food in the Last Year: Never true    Ran Out of Food in the Last Year: Never true  Transportation Needs: No  Transportation Needs (02/26/2023)   PRAPARE - Administrator, Civil Service (Medical): No    Lack of Transportation (Non-Medical): No  Physical Activity: Not on file  Stress: Not on file  Social Connections: Not on file  Intimate Partner Violence: Not At Risk (03/11/2023)   Received from [facility]   HITS    Over the last 12 months how often did your partner physically hurt you?: Never    Over the last 12 months how often did your partner insult you or talk down to you?: Never    Over the last 12 months how often did your partner threaten you with physical harm?: Never    Over the last 12 months how often did your partner scream or curse at you?: Never    Review of Systems Per HPI.   Objective:   Vitals:   07/27/23 1253  BP: 102/68  Pulse: (!) 54  Temp: 98.2 F (36.8 C)  SpO2: 100%  Weight: 173 lb (78.5 kg)  Height: 5\' 6"  (1.676 m)     Physical Exam Vitals reviewed.  Constitutional:      Appearance: He is well-developed.  HENT:     Head: Normocephalic and atraumatic.  Neck:     Vascular: No carotid bruit or JVD.  Cardiovascular:     Rate and Rhythm: Normal rate and regular rhythm.     Heart sounds: Normal heart sounds. No murmur heard. Pulmonary:     Effort: Pulmonary effort is normal.     Breath sounds: Normal breath sounds. No rales.  Abdominal:     General: Abdomen is flat. There is no distension.     Tenderness: There is no abdominal tenderness.  Musculoskeletal:     Right lower leg: No edema.     Left lower leg: No edema.  Skin:    General: Skin is warm and dry.  Neurological:     Mental Status: He is alert and oriented to person, place, and time.  Psychiatric:        Mood and Affect: Mood normal.    Controlled substance database (PDMP) reviewed. No concerns appreciated. Xanax  last filled in March.   Assessment & Plan:  Camp Kreger is a 63 y.o. male . Situational anxiety - Plan: ALPRAZolam  (XANAX )  0.25 MG tablet  Dyspnea,  unspecified type - Plan: ALPRAZolam  (XANAX ) 0.25 MG tablet  Abnormality of breathing - Plan: ALPRAZolam  (XANAX ) 0.25 MG tablet  Hiatal hernia - Plan: ALPRAZolam  (XANAX ) 0.25 MG tablet  Recurrent episodes of dyspnea with sensation of feeling like he cannot take a deep breath.  Multiple episodes of ER evaluation with reassuring testing including evaluation for cardiac or pulmonary cause, specifically has had normal delta troponins and no sign of PE on CT imaging.  Suspect there may be a component of secondary anxiety after initial symptoms from his hiatal hernia.  Workup planned including barium swallow with general surgery then consideration of repair of hiatal hernia which may be helpful but as the surgeon has mentioned this may not completely resolve his symptoms.  He does have some improvement with use of alprazolam .  Unfortunately there was some misunderstanding with use of sertraline  versus alprazolam  he plans to start sertraline  daily with as needed alprazolam  for breakthrough symptoms in the interim.  If he is doing well with the 25 mg dose as far as tolerability but minimal change in symptoms after 1 month he can increase to 50 mg on his own and then follow-up with me in 6 weeks as he will be out of the country for a month.  ER/RTC precautions given in the interim.  Continue follow-up with cardiology including for monitoring of Repatha  that is now covered and plan for start of CPAP.   42 minutes spent during visit, including chart review, ER note and lab/study review, multiple notes from specialists as above, counseling and assimilation of information, exam, discussion of plan, and chart completion.    Meds ordered this encounter  Medications   ALPRAZolam  (XANAX ) 0.25 MG tablet    Sig: Take 1-2 tablets (0.25-0.5 mg total) by mouth 2 (two) times daily as needed for anxiety.    Dispense:  30 tablet    Refill:  0   Patient Instructions  I am encouraged about the testing through the ER,  especially with the similar symptoms you have experienced previously.  As we discussed it still could be related in part due to your hiatal hernia and then possible anxiety component.  Try starting sertraline  once per day.  Over time that may help prevent anxiety, or manage it better than just taking alprazolam .  You can still take alprazolam  as needed temporarily, but the goal is to wean off the alprazolam  eventually or only as needed rarely. We will start low dose, but if you tolerating that med and feel minimal benefit after 1 month, then take 2 per day (50mg  total dose).  We will follow-up in 6 weeks.  But I am happy to talk with you sooner if any questions regarding this plan or new symptoms.  Keep follow up with general surgery regarding the other testing and treatment for hiatal hernia.   Hang in there!     Signed,   Caro Christmas, MD [facility], [facility] 07/27/23 1:39 PM

## 2023-07-27 NOTE — Patient Instructions (Addendum)
 I am encouraged about the testing through the ER, especially with the similar symptoms you have experienced previously.  As we discussed it still could be related in part due to your hiatal hernia and then possible anxiety component.  Try starting sertraline  once per day.  Over time that may help prevent anxiety, or manage it better than just taking alprazolam .  You can still take alprazolam  as needed temporarily, but the goal is to wean off the alprazolam  eventually or only as needed rarely. We will start low dose, but if you tolerating that med and feel minimal benefit after 1 month, then take 2 per day (50mg  total dose).  We will follow-up in 6 weeks.  But I am happy to talk with you sooner if any questions regarding this plan or new symptoms.  Keep follow up with general surgery regarding the other testing and treatment for hiatal hernia.   Hang in there!

## 2023-07-28 ENCOUNTER — Encounter: Admitting: Internal Medicine

## 2023-07-29 ENCOUNTER — Ambulatory Visit: Admitting: Surgery

## 2023-07-29 ENCOUNTER — Other Ambulatory Visit: Payer: Self-pay | Admitting: Internal Medicine

## 2023-08-05 ENCOUNTER — Telehealth: Payer: Self-pay | Admitting: Internal Medicine

## 2023-08-05 ENCOUNTER — Other Ambulatory Visit (HOSPITAL_COMMUNITY): Payer: Self-pay

## 2023-08-05 NOTE — Telephone Encounter (Signed)
 Reason for walk-in: Walk-in Reasons: having symptoms (such as chest pain, palpitations, etc.) - experiencing shortness of breath  If patient is requesting to be seen today, or if patient is having symptoms:  What symptoms are being reported (if any)?  Shortness of Breath - patient had a stint implanted at a previous time. Shelah Derry to the Emergency room multiple times and is being discharged each time saying nothing is wrong.  Route to triage pool and ensure Teams message has been sent to the Triage Walk-In chat.  3.   For medication samples, medication refills, HIM requests, appointment requests, lab-related requests, or form/record drop-off, please route to the appropriate pool.

## 2023-08-05 NOTE — Telephone Encounter (Signed)
 Spoke with pt in lobby and he had stated that he has been having shortness of breath spells for the last month or so consistently. He stated they first started when he got his stent placed 2+ years ago but then it had gone away for about a 6 month period and now intermittently comes back. Pt stated that he last saw Dr. Maximo Spar about a month ago and has seen a pulmonologist as well as an gastroenterologist and has had numerous tests completed along with ED visits that all have not shown any acute findings. Explained to pt that I would contact scheduling and send this to both Dr. Maximo Spar and his nurse as well to see about getting an appointment. Pt is supposed to travel but is scared to do so with the shortness of breath that has not been linked to any dx.

## 2023-08-06 NOTE — Telephone Encounter (Signed)
 Scheduled for 08/07/23 @ 3:20pm

## 2023-08-07 ENCOUNTER — Ambulatory Visit: Attending: Internal Medicine | Admitting: Internal Medicine

## 2023-08-07 VITALS — BP 122/82 | HR 59 | Ht 66.0 in | Wt 170.8 lb

## 2023-08-07 DIAGNOSIS — I2510 Atherosclerotic heart disease of native coronary artery without angina pectoris: Secondary | ICD-10-CM | POA: Diagnosis not present

## 2023-08-07 DIAGNOSIS — R0602 Shortness of breath: Secondary | ICD-10-CM | POA: Diagnosis not present

## 2023-08-07 DIAGNOSIS — E785 Hyperlipidemia, unspecified: Secondary | ICD-10-CM

## 2023-08-07 NOTE — Progress Notes (Signed)
 OFFICE NOTE  Chief Complaint:  Follow-up DOE  Primary Care Physician: Benjiman Bras, MD  HPI:  Todd [name] is a 63 year old male who was previously seen at [facility] heart and vascular by Dr. Johnney Nam. He underwent a stress test, echocardiogram and monitor which were unrevealing. He was told he was well and did not need to follow back up with us . He recently had an episode where he had some visual abnormalities including seeing white spots. He presented to the emergency room and there was concern for TIA or stroke. Workup was generally unremarkable except for some cerebral atherosclerosis. It was also considered that he might have had a migraine although he had no headache and has no history of headaches. He currently denies any chest pain although does get some mild shortness of breath with exertion. 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 He does have a family history of heart disease in his father who apparently had an enlarged heart and fluid around the heart. He also has a history of anxiety and was briefly a medication for anxiety and depression in the past. He takes Lipitor for elevated LDL cholesterol recently which was 130.  I saw Todd [name] back in the office today. He denies any worsening palpitations. He was in the emergency room apparently this morning with blurred vision and seeing spots. He also had a headache on the right side of his head. He left apparently before being fully evaluated. He's had episodes like this before. There is question is whether this could be complicated migraine or TIA. He denies any chest pain or worsening shortness of breath.  11/12/2016  Todd [name] was seen today in follow-up. He was recently seen in the ER for chest discomfort. He says he occasionally gets some palpitations which are short-lived and chest discomfort. He's also been having visual changes. That is typically  followed by headache. He underwent neurology evaluation in the past for this as previously described in my notes. MRI showed some cerebral atherosclerosis but was felt to have no evidence of stroke. It was thought that his symptoms are related to migraine. He reports chronic stress and running his own business. She's not currently on medications. He was prescribed some medications for migraines ever since his symptoms improved he stopped taking them and did not follow-up with the neurologist. With regard to the chest pain it is mostly sharp, not necessarily associated with exertion or relieved by rest. Sometimes associated with palpitations.   01/04/2021  Todd [name] not is seen today in follow-up.  He is considered a new patient today since he has not been seen in the past 3 years.  He is again having some palpitations.  They occur fairly sporadically but sometimes can be more intense.  We talked a lot about his diet.  He said he does not drink often but he drinks green tea.  There is between 30 to 50 mg of caffeine and every cup of green tea and therefore if he could cut that back or switch to decaffeinated it may be helpful.  He may ultimately need low-dose beta-blocker.  He is also been experiencing shortness of breath per tickly walking upstairs.  This is worsened recently.  He has no history of coronary disease although lipids in August showed significant elevation.  He says his diet is pretty unregulated, meaning he eats what ever he can.  He is not very active is  he drives an Pharmac[employer], community and does not exercise much.  He recently started on 5 mg rosuvastatin  for this.  Total cholesterol was 238, HDL 46, triglycerides 123 and LDL 167.  07/31/2021  Todd [name] returns today for follow-up.  He was recently seen a number of times by Neomi Banks, NP for chest pressure.  Ultimately he underwent coronary CT angiography which showed a calcium  score of 370, 45th percentile for age and sex matched control, however he  was noted to have moderate stenosis in the mid LAD.  The study was sent for [facility] which suggested flow-limiting stenosis in the mid to distal LAD.  Subsequently he was sent for cardiac catheterization and was found to have an 80% focal stenosis of the mid LAD.  He also had 30% proximal RCA and 30% first marginal stenoses.  He underwent a drug-eluting stent to the LAD which reduced the stenosis to 0.  He was advised to stay on aspirin  and Plavix  for minimum of 6 months and his rosuvastatin  was increased to 20 mg daily.  Subsequently he has had numerous emergency department visits.  He reports his chest pressure was resolved and although he had a little bit of shortness of breath when leaving the hospital that improved as well after a few days.  He has subsequently had numerous episodes of sharp chest pain.  This is described as stabbing and along the left sternal wall.  He also has some discomfort in over the left trapezius area.  These episodes occur on a daily basis, sometimes wake him up at night or in the morning but occurred for example today in the afternoon just prior to his visit.  He is noted occasional low blood pressure with systolic blood pressures in the 90s however was seen this morning by pulmonary and blood pressure was normal.  Blood pressure today is 110/65.  He is on a low-dose of Toprol  but no other medications for blood pressure.  11/04/2021  Todd [name] returns today for follow-up.  He reports he is actually doing quite a bit better.  He gets very infrequent palpitations that only last for a few seconds.  He shortness of breath is improved and his chest pain is gone.  He has had some hemorrhoidal bleeding.  He is change his diet notes better more regular bowel movements with less constipation.  He has discussed with GI about possible surgery however they are waiting on that.  From a cardiac standpoint he would be able to stop the clopidogrel  for 5 days as of October 2023.  Preferably, would like  to restart it and have him continue the clopidogrel  and aspirin  until 1 year or April 2024.  He did have lipids in May which showed an elevated LDL of 135, however he had taken a holiday off the statin.  He reported that that did not actually change his symptoms so he started taking the medicine again.  He will need repeat lipid testing in a few months.  07/14/2022  Mr. Larner is seen today in follow-up.  He denies any recurrent angina.  He has had some palpitations but he says these have been going on for years.  Cholesterol is climbing somewhat with LDL recently at 90.  He understands that his target is less than 70 and he will need to continue to work on that.  He is now a year out from stenting and from my standpoint could stop his clopidogrel .  06/22/2023  Mr. Bergin is seen today in follow-up.  He reports still some dyspnea which he says is actually little better since he stopped exercising.  He did have pulmonary function testing.  His initial FEV1 was 42% however apparently he said he did not make a good respiratory effort.  He had repeat testing recently which showed an FEV1 of 104%.  In fact most of his repeat lung function test look normal.  He does have an upper respiratory infection today but is getting better.  He reports his palpitations have resolved.  An echo in 2023 which showed EF 65 to 70%.  He does have coronary disease both by CT coronary angiography and cardiac catheterization in 2023 at which time he was found to have mid LAD stenosis which was treated with a drug-eluting stent.  He has not had any anginal symptoms since then.  08/07/2023  Mr. Hosang is seen today in follow-up.  I saw him just about a month ago.  Since that has had continued shortness of breath.  He says it feels like it is difficult to get a deep breath.  It is hard for him to breathe and he has to take multiple breaths before he is able to get a satisfying breath.  He says that he did have pulmonary function  testing recently which was normal suggesting that his respiratory mechanics are good.  He does have a history of coronary artery disease although it sounds unlikely that this is cardiac.  He feels that it might be GI related.  He does feel sometimes that belching improves some of the symptoms.  He did have an EGD as well about a month and a half ago.  He was seen in the hospital with abdominal bloating.  He is scheduled to have a possible capsule endoscopy.  He was noted to have a small hiatal hernia and has follow-up with surgery although they do not feel that this should be causing the symptoms.  PMHx:  Past Medical History:  Diagnosis Date   CAD (coronary artery disease)    Complicated migraine    Deafness in left ear    Diverticulosis    External hemorrhoids    Headache    Hypertension    Internal hemorrhoids    Palpitations    Stroke [facility])    TIA (transient ischemic attack)    Tubular adenoma of colon    Vision abnormalities     Past Surgical History:  Procedure Laterality Date   APPENDECTOMY  12/01/11   lap appy   CORONARY STENT INTERVENTION N/A 07/17/2021   Procedure: CORONARY STENT INTERVENTION;  Surgeon: Odie Benne, MD;  Location: [facility] INVASIVE CV LAB;  Service: Cardiovascular;  Laterality: N/A;   LAPAROSCOPIC APPENDECTOMY  12/02/2011   Procedure: APPENDECTOMY LAPAROSCOPIC;  Surgeon: Evander Hills, DO;  Location: [facility] ORS;  Service: General;  Laterality: N/A;   LEFT HEART CATH AND CORONARY ANGIOGRAPHY N/A 07/17/2021   Procedure: LEFT HEART CATH AND CORONARY ANGIOGRAPHY;  Surgeon: Odie Benne, MD;  Location: [facility] INVASIVE CV LAB;  Service: Cardiovascular;  Laterality: N/A;    FAMHx:  Family History  Problem Relation Age of Onset   Hypertension Mother    Hypertension Father    Anemia Neg Hx    Arrhythmia Neg Hx    Asthma Neg Hx    Clotting disorder Neg Hx    Fainting Neg Hx    Heart attack Neg Hx    Heart disease Neg Hx    Heart failure Neg Hx     Hyperlipidemia Neg  Hx    Colon cancer Neg Hx    Esophageal cancer Neg Hx    Stomach cancer Neg Hx    Rectal cancer Neg Hx    Colon polyps Neg Hx     SOCHx:   reports that he has never smoked. He has never been exposed to tobacco smoke. He has never used smokeless tobacco. He reports that he does not drink alcohol and does not use drugs.  ALLERGIES:  Allergies  Allergen Reactions   Wellbutrin [Bupropion Hcl] Itching and Rash    ROS: Pertinent items noted in HPI and remainder of comprehensive ROS otherwise negative.  HOME MEDS: Current Outpatient Medications  Medication Sig Dispense Refill   ALPRAZolam  (XANAX ) 0.25 MG tablet Take 1-2 tablets (0.25-0.5 mg total) by mouth 2 (two) times daily as needed for anxiety. 30 tablet 0   aspirin  EC 81 MG tablet Take 81 mg by mouth daily. Swallow whole.     Evolocumab  (REPATHA  SURECLICK) 140 MG/ML SOAJ Inject 140 mg into the skin every 14 (fourteen) days. 2 mL 5   famotidine  (PEPCID ) 20 MG tablet Take 1 tablet (20 mg total) by mouth daily. 30 tablet 0   metoprolol  succinate (TOPROL -XL) 25 MG 24 hr tablet TAKE 1 TABLET (25 MG TOTAL) BY MOUTH DAILY. 90 tablet 3   pantoprazole  (PROTONIX ) 40 MG tablet Take 1 tablet (40 mg total) by mouth 2 (two) times daily. 60 tablet 11   rosuvastatin  (CRESTOR ) 20 MG tablet TAKE 1 TABLET BY MOUTH EVERY DAY 90 tablet 3   sertraline  (ZOLOFT ) 25 MG tablet Take 1 tablet (25 mg total) by mouth daily. 30 tablet 3   No current facility-administered medications for this visit.    LABS/IMAGING: No results found for this or any previous visit (from the past 48 hours).  No results found.  VITALS: BP 122/82 (BP Location: Right Arm, Patient Position: Sitting, Cuff Size: Normal)   Pulse (!) 59   Ht 5\' 6"  (1.676 m)   Wt 170 lb 12.8 oz (77.5 kg)   SpO2 97%   BMI 27.57 kg/m   EXAM: General appearance: alert and no distress Neck: no carotid bruit and no JVD Lungs: clear to auscultation bilaterally Heart: regular  rate and rhythm, S1, S2 normal, no murmur, click, rub or gallop Abdomen: soft, non-tender; bowel sounds normal; no masses,  no organomegaly Extremities: extremities normal, atraumatic, no cyanosis or edema Pulses: 2+ and symmetric Skin: Skin color, texture, turgor normal. No rashes or lesions Neurologic: Grossly normal Psych: Mildly anxious  EKG: EKG Interpretation Date/Time:  Friday Aug 07 2023 15:36:29 EDT Ventricular Rate:  59 PR Interval:  148 QRS Duration:  88 QT Interval:  430 QTC Calculation: 425 R Axis:   51  Text Interpretation: Sinus bradycardia When compared with ECG of 11-Jul-2023 01:13, No significant change since last tracing Confirmed by Dinah Franco [id number]) on 08/07/2023 3:49:09 PM    ASSESSMENT: Coronary artery disease status post PCI to the mid LAD with DES (06/2021) Abnormal coronary CT with CAC score of 370, 45th percentile for age and sex matched control DOE Palpitations Visual changes with headache - suspect migraine Mild dyspnea with exertion Atherosclerosis/dyslipidemia Anxiety OSA   PLAN: 1.   Mr. Winnett is again quite concerned about dyspnea meaning that he has trouble taking a deep breath.  It does not sound like pressure or heaviness that he had previously with his coronary disease although he has had a prior stent placed in 2023.  I like for him to undergo  a Myoview stress test.  I will also update his echocardiogram.  Hopefully this testing is negative and then he would be able to be cleared for surgery if this is necessary to help with his shortness of breath and/or hiatal hernia if this is an issue.  He has also been treated for some anxiety but he notes no improvement in the symptoms with these treatments.  Plan follow-up afterward.  Hazle Lites, MD, [facility], FNLA, FACP  [place]  [facility]  Medical Director of the Advanced Lipid [facility] &  Cardiovascular Risk Reduction Clinic Diplomate of the American Board of Clinical  Lipidology Attending Cardiologist  Direct Dial: [phone]  Fax: [phone]  Website:  [email or web address] 08/07/2023, 3:49 PM

## 2023-08-07 NOTE — Patient Instructions (Signed)
 Medication Instructions:  Your physician recommends that you continue on your current medications as directed. Please refer to the Current Medication list given to you today.  *If you need a refill on your cardiac medications before your next appointment, please call your pharmacy*  Testing/Procedures: Your physician has requested that you have an echocardiogram. Echocardiography is a painless test that uses sound waves to create images of your heart. It provides your doctor with information about the size and shape of your heart and how well your heart's chambers and valves are working. This procedure takes approximately one hour. There are no restrictions for this procedure. Please do NOT wear cologne, perfume, aftershave, or lotions (deodorant is allowed). Please arrive 15 minutes prior to your appointment time.  How to Prepare for Your Myoview Test (stress test):  Please do not take these medications before your test: Metoprolol  succinate 25 mg (please note if this is an exercise test pt should hold beta blocker prior) Your remaining medications may be taken with water. Nothing to eat or drink, except water, 4 hours prior to arrival time.  NO caffeine/decaffeinated products, or chocolate 12 hours prior to arrival. [place], please do not wear dresses.  Skirts or pants are approprate, please wear a short sleeve shirt. NO perfume, cologne or lotion Wear comfortable walking shoes.  NO HEELS! Total time is 3 to 4 hours; you may want to bring reading material for the waiting time. Please report to [facility] for your test  What to expect after you arrive:  Once you arrive and check in for your appointment an IV will be started in your arm.  Then the Technoligist will inject a small amount of radioactive tracer.  There will be a 1 hour waiting period after this injection.  A series of pictures will be taken of your heart following this waiting period.  You will be prepped for the stress portion  of the test.  During the stress portion of your test you will either walk on a treadmill or receive a small, safe amount of radioactive tracer injected in your IV.  After the stress portion, there is a short rest period during which time your heart and blood pressure will be monitored.  After the short rest period the Technologist will begin your second set of pictures.  Your doctor will inform you of your test results within 7-10 business days.  In preparation for your appointment, medication and supplies will be purchased.  Appointment availability is limited, so if you need to cancel or reschedule please call the office at [phone] 24 hours in advance to avoid a cancellation fee of $100.00  IF YOU THINK YOU MAY BE PREGNANT, PLEASE INFORM THE TECHNOLOGIST.   Follow-Up: At [facility], you and your health needs are our priority.  As part of our continuing mission to provide you with exceptional heart care, our providers are all part of one team.  This team includes your primary Cardiologist (physician) and Advanced Practice Providers or APPs (Physician Assistants and Nurse Practitioners) who all work together to provide you with the care you need, when you need it.  Your next appointment:   Pending following tests with Dr. Alline Areas

## 2023-08-10 ENCOUNTER — Ambulatory Visit (HOSPITAL_COMMUNITY)
Admission: RE | Admit: 2023-08-10 | Discharge: 2023-08-10 | Disposition: A | Discharge status: Home / Self Care | Source: Ambulatory Visit | Attending: Internal Medicine | Admitting: Internal Medicine

## 2023-08-10 ENCOUNTER — Other Ambulatory Visit: Payer: Self-pay | Admitting: Internal Medicine

## 2023-08-10 DIAGNOSIS — I2510 Atherosclerotic heart disease of native coronary artery without angina pectoris: Secondary | ICD-10-CM | POA: Diagnosis present

## 2023-08-10 DIAGNOSIS — R0602 Shortness of breath: Secondary | ICD-10-CM | POA: Diagnosis not present

## 2023-08-10 LAB — ECHOCARDIOGRAM COMPLETE
Area-P 1/2: 5.27 cm2
P 1/2 time: 592 ms
S' Lateral: 2.3 cm

## 2023-08-11 ENCOUNTER — Ambulatory Visit: Payer: Self-pay | Admitting: Internal Medicine

## 2023-08-14 ENCOUNTER — Ambulatory Visit (HOSPITAL_COMMUNITY)
Admission: RE | Admit: 2023-08-14 | Discharge: 2023-08-14 | Disposition: A | Discharge status: Home / Self Care | Source: Ambulatory Visit | Attending: Surgery | Admitting: Surgery

## 2023-08-14 DIAGNOSIS — K449 Diaphragmatic hernia without obstruction or gangrene: Secondary | ICD-10-CM | POA: Diagnosis present

## 2023-08-15 ENCOUNTER — Other Ambulatory Visit: Payer: Self-pay | Admitting: Family Medicine

## 2023-08-15 DIAGNOSIS — R140 Abdominal distension (gaseous): Secondary | ICD-10-CM

## 2023-08-18 ENCOUNTER — Ambulatory Visit (HOSPITAL_COMMUNITY)
Admission: RE | Admit: 2023-08-18 | Discharge: 2023-08-18 | Disposition: A | Discharge status: Home / Self Care | Source: Ambulatory Visit | Attending: Cardiology | Admitting: Cardiology

## 2023-08-18 ENCOUNTER — Other Ambulatory Visit: Payer: Self-pay

## 2023-08-18 DIAGNOSIS — I2510 Atherosclerotic heart disease of native coronary artery without angina pectoris: Secondary | ICD-10-CM | POA: Diagnosis present

## 2023-08-18 DIAGNOSIS — R0602 Shortness of breath: Secondary | ICD-10-CM | POA: Diagnosis present

## 2023-08-18 LAB — MYOCARDIAL PERFUSION IMAGING
LV dias vol: 46 mL (ref 62–150)
LV sys vol: 10 mL
Nuc Stress EF: 79 %
Peak HR: 107 {beats}/min
Rest HR: 62 {beats}/min
Rest Nuclear Isotope Dose: 10.4 mCi
SDS: 0
SRS: 0
SSS: 0
ST Depression (mm): 0 mm
Stress Nuclear Isotope Dose: 31.9 mCi
TID: 1.04

## 2023-08-18 MED ORDER — TECHNETIUM TC 99M TETROFOSMIN IV KIT
10.4000 | PACK | Freq: Once | INTRAVENOUS | Status: AC | PRN
  Administered 2023-08-18: 10.4 via INTRAVENOUS

## 2023-08-18 MED ORDER — TECHNETIUM TC 99M TETROFOSMIN IV KIT
31.9000 | PACK | Freq: Once | INTRAVENOUS | Status: AC | PRN
  Administered 2023-08-18: 31.9 via INTRAVENOUS

## 2023-08-18 MED ORDER — REGADENOSON 0.4 MG/5ML IV SOLN
INTRAVENOUS | Status: AC
  Filled 2023-08-18: qty 5

## 2023-08-18 MED ORDER — REGADENOSON 0.4 MG/5ML IV SOLN
0.4000 mg | Freq: Once | INTRAVENOUS | Status: AC
Start: 2023-08-18 — End: 2023-08-18
  Administered 2023-08-18: 0.4 mg via INTRAVENOUS

## 2023-08-19 ENCOUNTER — Ambulatory Visit: Admitting: Internal Medicine

## 2023-08-19 ENCOUNTER — Encounter: Payer: Self-pay | Admitting: Internal Medicine

## 2023-08-19 VITALS — BP 120/80 | HR 56 | Temp 98.2°F | Ht 66.0 in | Wt 171.4 lb

## 2023-08-19 DIAGNOSIS — G4733 Obstructive sleep apnea (adult) (pediatric): Secondary | ICD-10-CM | POA: Diagnosis not present

## 2023-08-19 DIAGNOSIS — K219 Gastro-esophageal reflux disease without esophagitis: Secondary | ICD-10-CM

## 2023-08-19 NOTE — Patient Instructions (Signed)
 Follow up with SLeep Apnea reports in 1 month Follow up with General Surgery  Continue medications for reflux  Make changes to your diet as tolerated  Avoid Allergens and Irritants Avoid secondhand smoke Avoid SICK contacts Recommend  Masking  when appropriate Recommend Keep up-to-date with vaccinations

## 2023-08-19 NOTE — Progress Notes (Signed)
 [facility] Pulmonary Medicine Consultation      Date: 08/19/2023,   MRN# [id number] Todd [name] 1960/05/20  Synopsis Patient was referred to [facility] pulmonary for abnormal breathing found to have significant obstructive airways disease on initial pulmonary function testing in January 2025 however there was significant discrepancy in his clinical symptoms and pulmonary function testing and a repeat pulmonary function testing March 2025 showed normal findings. Patient was also assessed for sleep apnea during this period and was found to have an AHI of 10 in the supine position  Pulmonary function testing reviewed in detail with the patient Overall, no significant findings on repeat pulmonary function testing Findings reviewed below Pulmonary function test April 17, 2023 Findings reported to patient in detail Postbronchodilator FEV1 FVC ratio was 41% predicted FEV1 is 51% predicted No significant bronchodilator response Total lung capacity 93% predicted Elevated RV and RV/TLC ratio consistent with hyperinflation and air trapping DLCO within normal limits flow-volume loops show restrictive pattern Expiratory limb shows blunting and scooping Overall findings suggest severe obstructive lung disease  Pulmonary function test June 09, 2023 Findings reported to patient in detail Postbronchodilator FEV1 FVC ratio is 80% predicted FEV1 is 104% predicted FVC is 98% predicted Total lung capacity is 100% predicted RV and RV/TLC ratio is 107% predicted DLCO within normal limits Flow-volume loops show mild obstructive scooping of the expiratory limb Overall findings normal pulmonary function testing No significant abnormality seen  There was  no correlation between symptoms and the pulmonary function testing There is a significant discrepancy in his clinical exam and physical findings and pulmonary function testing,Therefore a repeat pulmonary function test was obtained which showed  no significant obstructive or restrictive disease Current pulmonary function test in March 2025 indicates normal pulmonary function testing    CHIEF COMPLAINT:   Follow-up assessment for OSA Follow-up assessment for shortness of breath  HISTORY OF PRESENT ILLNESS   Follow-up assessment for shortness of breath No significant respiratory findings at this time No exacerbation at this time No evidence of heart failure at this time No evidence or signs of infection at this time No respiratory distress No fevers, chills, nausea, vomiting, diarrhea No evidence of lower extremity edema No evidence hemoptysis  Patient has excellent exercise capacity Walks 1 mile per day without any issues No significant respiratory compromise Non-smoker nonalcoholic Patient does cook often  There is a significant difference in pulmonary function test from January to March of this year  +GERD and Bloating-uncontrolled gas Recommend PPI    Patient's main complaint is that he has intermittent reflux and severe bloating He says that belching and burping relieves his abdominal pressure which then relieves his shortness of breath Patient has had colonoscopy to assess for bleeding and cancer   upper endoscopy for further evaluation +hiatel hernia  prescribed Protonix    Assessment OSA Split-night sleep study performed in March 2025 AHI 3% showed 7.7 AHI in the supine position 10.8 Based on review of data patient does have mild OSA Patient to be started on CPAP-already ordered Patient started CPAP to weeks ago Will need several more weeks of data and follow-up     PAST MEDICAL HISTORY   Past Medical History:  Diagnosis Date   CAD (coronary artery disease)    Complicated migraine    Deafness in left ear    Diverticulosis    External hemorrhoids    Headache    Hypertension    Internal hemorrhoids    Palpitations    Stroke (HCC)  TIA (transient ischemic attack)    Tubular adenoma of  colon    Vision abnormalities      SURGICAL HISTORY   Past Surgical History:  Procedure Laterality Date   APPENDECTOMY  12/01/11   lap appy   CORONARY STENT INTERVENTION N/A 07/17/2021   Procedure: CORONARY STENT INTERVENTION;  Surgeon: Odie Benne, MD;  [facility]: [facility];  Service: Cardiovascular;  Laterality: N/A;   LAPAROSCOPIC APPENDECTOMY  12/02/2011   Procedure: APPENDECTOMY LAPAROSCOPIC;  Surgeon: Evander Hills, DO;  Location: [facility];  Service: General;  Laterality: N/A;   LEFT HEART CATH AND CORONARY ANGIOGRAPHY N/A 07/17/2021   Procedure: LEFT HEART CATH AND CORONARY ANGIOGRAPHY;  Surgeon: Odie Benne, MD;  Location: [facility];  Service: Cardiovascular;  Laterality: N/A;     FAMILY HISTORY   Family History  Problem Relation Age of Onset   Hypertension Mother    Hypertension Father    Anemia Neg Hx    Arrhythmia Neg Hx    Asthma Neg Hx    Clotting disorder Neg Hx    Fainting Neg Hx    Heart attack Neg Hx    Heart disease Neg Hx    Heart failure Neg Hx    Hyperlipidemia Neg Hx    Colon cancer Neg Hx    Esophageal cancer Neg Hx    Stomach cancer Neg Hx    Rectal cancer Neg Hx    Colon polyps Neg Hx      SOCIAL HISTORY   Social History   Tobacco Use   Smoking status: Never    Passive exposure: Never   Smokeless tobacco: Never  Vaping Use   Vaping status: Never Used  Substance Use Topics   Alcohol use: No   Drug use: No     MEDICATIONS    Home Medication:  Current Outpatient Rx   Order #: [id number] Class: Normal   Order #: [id number] Class: Historical Med   Order #: [id number] Class: Normal   Order #: [id number] Class: Normal   Order #: [id number] Class: Normal   Order #: [id number] Class: Normal   Order #: [id number] Class: Normal   Order #: [id number] Class: Normal    Current Medication:  Current Outpatient Medications:    ALPRAZolam  (XANAX ) 0.25 MG tablet, Take 1-2 tablets (0.25-0.5 mg total) by mouth 2 (two)  times daily as needed for anxiety., Disp: 30 tablet, Rfl: 0   aspirin  EC 81 MG tablet, Take 81 mg by mouth daily. Swallow whole., Disp: , Rfl:    Evolocumab  (REPATHA  SURECLICK) 140 MG/ML SOAJ, Inject 140 mg into the skin every 14 (fourteen) days., Disp: 2 mL, Rfl: 5   famotidine  (PEPCID ) 20 MG tablet, Take 1 tablet (20 mg total) by mouth daily., Disp: 30 tablet, Rfl: 0   metoprolol  succinate (TOPROL -XL) 25 MG 24 hr tablet, TAKE 1 TABLET (25 MG TOTAL) BY MOUTH DAILY., Disp: 90 tablet, Rfl: 3   pantoprazole  (PROTONIX ) 40 MG tablet, Take 1 tablet (40 mg total) by mouth 2 (two) times daily., Disp: 60 tablet, Rfl: 11   rosuvastatin  (CRESTOR ) 20 MG tablet, TAKE 1 TABLET BY MOUTH EVERY DAY, Disp: 90 tablet, Rfl: 3   sertraline  (ZOLOFT ) 25 MG tablet, Take 1 tablet (25 mg total) by mouth daily., Disp: 30 tablet, Rfl: 3    ALLERGIES   Wellbutrin [bupropion hcl]  BP 120/80 (BP Location: Left Arm, Patient Position: Sitting, Cuff Size: Normal)   Pulse (!) 56   Temp 98.2 F (36.8 C) (Oral)   Ht 5'  6" (1.676 m)   Wt 171 lb 6.4 oz (77.7 kg)   SpO2 100%   BMI 27.66 kg/m     Review of Systems: Gen:  Denies  fever, sweats, chills weight loss  HEENT: Denies blurred vision, double vision, ear pain, eye pain, hearing loss, nose bleeds, sore throat Cardiac:  No dizziness, chest pain or heaviness, chest tightness,edema, No JVD Resp:   No cough, -sputum production, -shortness of breath,-wheezing, -hemoptysis,  Other:  All other systems negative   Physical Examination:   General Appearance: No distress  EYES PERRLA, EOM intact.   NECK Supple, No JVD Pulmonary: normal breath sounds, No wheezing.  CardiovascularNormal S1,S2.  No m/r/g.   Abdomen: Benign, Soft, non-tender. Neurology UE/LE 5/5 strength, no focal deficits Ext pulses intact, cap refill intact ALL OTHER ROS ARE NEGATIVE      IMAGING    MYOCARDIAL PERFUSION IMAGING Addendum Date: 08/18/2023   The study is normal. The study is low  risk.   No ST deviation was noted.   LV perfusion is normal. There is no evidence of ischemia. There is no evidence of infarction.   Left ventricular function is normal. Nuclear stress EF: 79%. The left ventricular ejection fraction is hyperdynamic (>65%). End diastolic cavity size is normal. End systolic cavity size is normal. No evidence of transient ischemic dilation (TID) noted.   CT images were obtained for attenuation correction and were examined for the presence of coronary calcium  when appropriate.   Coronary calcium  assessment not performed due to prior revascularization.   Prior study available for comparison from 07/25/2014. No changes compared to prior study.   Result Date: 08/18/2023   The study is normal. The study is low risk.   No ST deviation was noted.   LV perfusion is normal. There is no evidence of ischemia. There is no evidence of infarction.   Left ventricular function is normal. Nuclear stress EF: 79%. The left ventricular ejection fraction is hyperdynamic (>65%). End diastolic cavity size is normal. End systolic cavity size is normal. No evidence of transient ischemic dilation (TID) noted.   CT images were obtained for attenuation correction and were examined for the presence of coronary calcium  when appropriate.   Coronary calcium  assessment not performed due to prior revascularization.   Prior study available for comparison from 07/25/2014. No changes compared to prior study.   DG ESOPHAGUS W DOUBLE CM (HD) Result Date: 08/14/2023 CLINICAL DATA:  Provided history: Hiatal hernia. Additional history obtained from electronic MEDICAL RECORD NUMBERUpper endoscopy performed 06/11/2023 demonstrating a 3 cm hiatal hernia. EXAM: ESOPHOGRAM/BARIUM SWALLOW TECHNIQUE: A combined double contrast and single contrast examination performed using effervescent crystals, thick barium liquid, and thin barium liquid. Additionally, the patient swallowed a 13 mm barium tablet under fluoroscopy. FLUOROSCOPY:  Radiation Exposure Index (as provided by the fluoroscopic device): 34.40 mGy Kerma COMPARISON:  Chest CT 07/11/2023. FINDINGS: Fluoroscopic evaluation demonstrates normal caliber and smooth contour of the esophagus. No evidence of a fixed stricture, mass or mucosal abnormality. Normal esophageal motility observed. No appreciable hiatal hernia. No gastroesophageal reflux observed. The patient swallowed a 13 mm barium tablet, which freely traversed the esophagus and passed into the stomach. IMPRESSION: 1. The hiatal hernia reported on the prior upper endoscopy of 06/11/2023 is not appreciable on today's examination. 2. Otherwise unremarkable esophagram, as described. Electronically Signed   By: Bascom Lily D.O.   On: 08/14/2023 10:13   ECHOCARDIOGRAM COMPLETE Result Date: 08/10/2023    ECHOCARDIOGRAM REPORT   Patient Name:  Todd [name]   Date of Exam: 08/10/2023 Medical Rec #:  [id number]     Height:       66.0 in Accession #:    [id number]    Weight:       170.8 lb Date of Birth:  04-15-1960      BSA:          1.870 m Patient Age:    63 years      BP:           122/82 mmHg Patient Gender: M             HR:           64 bpm. Exam Location:  [address] Procedure: 2D Echo, 3D Echo, Cardiac Doppler and Color Doppler (Both Spectral            and Color Flow Doppler were utilized during procedure). Indications:    I25.10 CAD  History:        Patient has prior history of Echocardiogram examinations, most                 recent 07/21/2021. CAD, Abnormal ECG, Stroke and TIA,                 Signs/Symptoms:Dyspnea, Shortness of Breath and Chest Pain; Risk                 Factors:Hypertension. Coronary Stenting.  Sonographer:    Ewing Holiday RDCS Referring Phys: Aviva Lemmings HILTY IMPRESSIONS  1. Left ventricular ejection fraction, by estimation, is 70 to 75%. The left ventricle has hyperdynamic function. The left ventricle has no regional wall motion abnormalities. Left ventricular diastolic parameters were normal.  2.  Right ventricular systolic function is normal. The right ventricular size is normal.  3. The mitral valve is normal in structure. Trivial mitral valve regurgitation.  4. The aortic valve is tricuspid. Aortic valve regurgitation is mild. FINDINGS  Left Ventricle: Left ventricular ejection fraction, by estimation, is 70 to 75%. The left ventricle has hyperdynamic function. The left ventricle has no regional wall motion abnormalities. The left ventricular internal cavity size was normal in size. There is no left ventricular hypertrophy. Left ventricular diastolic parameters were normal. Right Ventricle: The right ventricular size is normal. Right vetricular wall thickness was not assessed. Right ventricular systolic function is normal. Left Atrium: Left atrial size was normal in size. Right Atrium: Right atrial size was normal in size. Pericardium: There is no evidence of pericardial effusion. Mitral Valve: The mitral valve is normal in structure. Trivial mitral valve regurgitation. Tricuspid Valve: The tricuspid valve is normal in structure. Tricuspid valve regurgitation is mild. Aortic Valve: The aortic valve is tricuspid. Aortic valve regurgitation is mild. Aortic regurgitation PHT measures 592 msec. Pulmonic Valve: The pulmonic valve was normal in structure. Pulmonic valve regurgitation is not visualized. Aorta: The aortic root and ascending aorta are structurally normal, with no evidence of dilitation. IAS/Shunts: No atrial level shunt detected by color flow Doppler. Additional Comments: 3D was performed not requiring image post processing on an independent workstation and was normal.  LEFT VENTRICLE PLAX 2D LVIDd:         4.40 cm   Diastology LVIDs:         2.30 cm   LV e' medial:    8.70 cm/s LV PW:         0.80 cm   LV E/e' medial:  7.1 LV IVS:  0.80 cm   LV e' lateral:   13.40 cm/s LVOT diam:     2.20 cm   LV E/e' lateral: 4.6 LV SV:         93 LV SV Index:   50 LVOT Area:     3.80 cm                            3D Volume EF:                          3D EF:        67 %                          LV EDV:       112 ml                          LV ESV:       37 ml                          LV SV:        76 ml RIGHT VENTRICLE RV Basal diam:  3.50 cm RV S prime:     13.80 cm/s TAPSE (M-mode): 2.0 cm RVSP:           25.5 mmHg LEFT ATRIUM             Index        RIGHT ATRIUM           Index LA diam:        3.50 cm 1.87 cm/m   RA Pressure: 3.00 mmHg LA Vol (A2C):   50.8 ml 27.16 ml/m  RA Area:     17.90 cm LA Vol (A4C):   62.7 ml 33.53 ml/m  RA Volume:   50.00 ml  26.74 ml/m LA Biplane Vol: 58.1 ml 31.07 ml/m  AORTIC VALVE LVOT Vmax:   99.10 cm/s LVOT Vmean:  66.625 cm/s LVOT VTI:    0.245 m AI PHT:      592 msec  AORTA Ao Root diam: 3.00 cm Ao Asc diam:  3.30 cm MITRAL VALVE               TRICUSPID VALVE MV Area (PHT   cm         TR Peak grad:   22.5 mmHg MV Decel Time: 144 msec    TR Vmax:        237.00 cm/s MV E velocity: 61.75 cm/s  Estimated RAP:  3.00 mmHg MV A velocity: 50.00 cm/s  RVSP:           25.5 mmHg MV E/A ratio:  1.24                            SHUNTS                            Systemic VTI:  0.25 m                            Systemic Diam: 2.20 cm Ola Berger MD Electronically signed by Ola Berger MD Signature Date/Time: 08/10/2023/6:16:53 PM    Final  ASSESSMENT/PLAN   63 year old pleasant male seen today for signs symptoms of obstructive sleep apnea with HST showing mild OSA with AHI between 7 and 10 based on position with intermittent shortness of breath likely related to abdominal symptoms of belching and burping with bloating His repeat pulmonary function tests are within normal limits no significant findings for obstructive or restrictive lung disease which was noted on previous pulmonary function test Patient has very good exercise capacity without any issues  Previous history of abnormal PFTs Definitely related to abdominal symptoms Increased bloating related to diet which causes  his shortness of breath His shortness of breath is definitely reactive to what he eats All of his symptoms are related to his GI tract Most recent pulmonary function test are within normal limits No significant respiratory issues or obstructive or restrictive pattern lung disease Patient with very good exercise capacity  Assessment of OSA Follow-up in 1 month with CPAP compliance report  Hiatal hernia and reflux Refill Protonix  Recommend referral to [facility] to assess for hernia repair Follow-up with general surgery recommendations Highly consider hernia repair    MEDICATION ADJUSTMENTS/LABS AND TESTS ORDERED: Protonix  General Surgery-hernia assessment [facility]-Dr Pabon Avoid Allergens and Irritants Avoid secondhand smoke Avoid SICK contacts Recommend  Masking  when appropriate Recommend Keep up-to-date with vaccinations Dietary changes recommended   CURRENT MEDICATIONS REVIEWED AT LENGTH WITH PATIENT TODAY   Patient  satisfied with Plan of action and management. All questions answered   Follow up 6 months   I spent a total of 47  minutes reviewing chart data, face-to-face evaluation with the patient, counseling and coordination of care as detailed above.      Lady Pier, M.D.  [facility]  Medical Director [facility] Medical Director [facility] Cardio-Pulmonary Department

## 2023-08-26 ENCOUNTER — Ambulatory Visit: Payer: Self-pay | Admitting: *Deleted

## 2023-09-02 ENCOUNTER — Ambulatory Visit (INDEPENDENT_AMBULATORY_CARE_PROVIDER_SITE_OTHER): Admitting: Surgery

## 2023-09-02 ENCOUNTER — Encounter: Payer: Self-pay | Admitting: Surgery

## 2023-09-02 VITALS — BP 111/65 | HR 50 | Ht 66.0 in | Wt 170.0 lb

## 2023-09-02 DIAGNOSIS — K449 Diaphragmatic hernia without obstruction or gangrene: Secondary | ICD-10-CM

## 2023-09-02 NOTE — Patient Instructions (Signed)
 Follow-up with our office in 2 months.   Please call and ask to speak with a nurse if you develop questions or concerns.

## 2023-09-02 NOTE — Progress Notes (Signed)
 Outpatient Surgical Follow Up  09/02/2023  Todd [name] is an 63 y.o. male.   Chief Complaint  Patient presents with   Follow-up    HPI: Todd [name] is a 63 y.o. male seen in F/U for hiatal hernia and significant pulmonary symptoms.  Over the last few months had significant issues with catching his breath.  Seems to have some dyspnea that is intermittent.  He has undergone significant pulmonary workup that were repeated because the results were inconsistent.  He was evaluated by pulmonary medicine and there was no evidence of obstructive or restrictive lung disease.  He did show a decent exercise capacity.  He does have mild sleep apnea and they recommended CPAP. He does endorse some intermittent reflux that has improved with PPI.  He denies any dysphagia.  He did have upper endoscopy that I have personally reviewed showing a 3 cm hiatal hernia he did have gastritis w antral erythema CAD w NON STEMI w LAD Stent DES 06/2021 on asa, recently saw cards w/o acute coronary issues CT of the chest that I have also personally reviewed showing may be a small sliding hiatal hernia, barium swallow did not show hernia although sliding hernia can spontaneously reduce when performing swallow exams. He feels that if he eats smaller meal he avoid pulm sxs. Nml gastric emptying. Myoview  showed no reversible ischemia  Pulmonary medicine feels that his pul sxs are related to GERD . He is originally from [place], planning to go to [place] where his mother lives.  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  Past Medical History:  Diagnosis Date   CAD (coronary artery disease)    Complicated migraine    Deafness in left ear    Diverticulosis    External hemorrhoids    Headache    Hypertension    Internal hemorrhoids    Palpitations    Stroke [facility])    TIA (transient ischemic attack)    Tubular adenoma of colon    Vision abnormalities     Past Surgical History:  Procedure Laterality Date   APPENDECTOMY   12/01/11   lap appy   CORONARY STENT INTERVENTION N/A 07/17/2021   Procedure: CORONARY STENT INTERVENTION;  Surgeon: Odie Benne, MD;  Location: [facility];  Service: Cardiovascular;  Laterality: N/A;   LAPAROSCOPIC APPENDECTOMY  12/02/2011   Procedure: APPENDECTOMY LAPAROSCOPIC;  Surgeon: Evander Hills, DO;  Location: [facility];  Service: General;  Laterality: N/A;   LEFT HEART CATH AND CORONARY ANGIOGRAPHY N/A 07/17/2021   Procedure: LEFT HEART CATH AND CORONARY ANGIOGRAPHY;  Surgeon: Odie Benne, MD;  Location: [facility];  Service: Cardiovascular;  Laterality: N/A;    Family History  Problem Relation Age of Onset   Hypertension Mother    Hypertension Father    Anemia Neg Hx    Arrhythmia Neg Hx    Asthma Neg Hx    Clotting disorder Neg Hx    Fainting Neg Hx    Heart attack Neg Hx    Heart disease Neg Hx    Heart failure Neg Hx    Hyperlipidemia Neg Hx    Colon cancer Neg Hx    Esophageal cancer Neg Hx    Stomach cancer Neg Hx    Rectal cancer Neg Hx    Colon polyps Neg Hx     Social History:  reports that he has never smoked. He has never been exposed to tobacco smoke. He has never used smokeless tobacco. He reports that  he does not drink alcohol and does not use drugs.  Allergies:  Allergies  Allergen Reactions   Wellbutrin [Bupropion Hcl] Itching and Rash    Medications reviewed.    ROS Full ROS performed and is otherwise negative other than what is stated in HPI   BP 111/65   Pulse (!) 50   Ht 5' 6 (1.676 m)   Wt 170 lb (77.1 kg)   SpO2 100%   BMI 27.44 kg/m   Physical Exam Vitals and nursing note reviewed. Exam conducted with a chaperone present.  Constitutional:      General: He is not in acute distress.    Appearance: Normal appearance. He is not ill-appearing.  Cardiovascular:     Rate and Rhythm: Normal rate and regular rhythm.  Pulmonary:     Effort: Pulmonary effort is normal. No respiratory distress.      Breath sounds: Normal breath sounds. No stridor.  Abdominal:     General: Abdomen is flat. There is no distension.     Palpations: Abdomen is soft. There is no mass.     Tenderness: There is no abdominal tenderness. There is no guarding or rebound.     Hernia: No hernia is present.     Comments: Periumbilical scar  Musculoskeletal:     Cervical back: Normal range of motion and neck supple. No rigidity or tenderness.  Lymphadenopathy:     Cervical: No cervical adenopathy.  Skin:    General: Skin is warm and dry.     Capillary Refill: Capillary refill takes less than 2 seconds.  Neurological:     General: No focal deficit present.     Mental Status: He is alert and oriented to person, place, and time.  Psychiatric:        Mood and Affect: Mood normal.        Behavior: Behavior normal.        Thought Content: Thought content normal.        Judgment: Judgment normal.     Assessment/Plan: 63 year old male with a sliding hiatal hernia with significant reflux causing some hyperactivity in his airway.  He does have intermittent shortness of breath that is correlated with reflux episode and when he takes large meals.  I had an extensive discussion with the patient regarding his disease process.  I did discuss at length options of medical versus surgical therapy.  I do think that reflux is contributing to his pulmonary symptoms.  I did talk to him at length about surgical intervention and robotic repair with this will entail potential restrictions to include diet modification and lifting restrictions.  The risk benefits and possible occasions including but not limited to, bleeding, infection, bloating, delayed gastric emptying, bowel or esophageal injuries.  He understands and he wants to get this procedure done.  Right now he wants to go to [place] to visit his mother.  I will see him back in a couple months and at that time we can schedule the repair of the hiatal hernia.  Please note the spent 40  minutes in this encounter including personally reviewing imaging studies, counseling the patient, coordinating his care, placing orders and performing documentation    Evelia Hipp, MD [facility] General Surgeon

## 2023-09-09 ENCOUNTER — Ambulatory Visit (INDEPENDENT_AMBULATORY_CARE_PROVIDER_SITE_OTHER): Admitting: Family Medicine

## 2023-09-09 VITALS — BP 120/82 | HR 68 | Resp 18 | Ht 66.0 in | Wt 169.6 lb

## 2023-09-09 DIAGNOSIS — F418 Other specified anxiety disorders: Secondary | ICD-10-CM

## 2023-09-09 DIAGNOSIS — R208 Other disturbances of skin sensation: Secondary | ICD-10-CM

## 2023-09-09 DIAGNOSIS — E876 Hypokalemia: Secondary | ICD-10-CM | POA: Diagnosis not present

## 2023-09-09 DIAGNOSIS — K449 Diaphragmatic hernia without obstruction or gangrene: Secondary | ICD-10-CM | POA: Diagnosis not present

## 2023-09-09 DIAGNOSIS — R0600 Dyspnea, unspecified: Secondary | ICD-10-CM | POA: Diagnosis not present

## 2023-09-09 DIAGNOSIS — R001 Bradycardia, unspecified: Secondary | ICD-10-CM

## 2023-09-09 NOTE — Progress Notes (Signed)
 Subjective:  Patient ID: Todd [name], male    DOB: 05-05-1960  Age: 63 y.o. MRN: [id number]  CC:  Chief Complaint  Patient presents with   Anxiety    F/u   Shortness of Breath    F/u - patient reports resolved   Hiatal Hernia    F/u - patient reports improvement but will be having surgery in about 2 months    HPI [facility] presents for  Follow-up of concerns above.  Situational anxiety Last discussed May 5.  Recurrent episodes of dyspnea, sensation of not feeling like he can take a deep breath, see previous evaluations, multiple ER evaluations including testing for cardiac or pulmonary causes which have been reassuring including repeat pulmonary evaluation that did not show evidence of obstructive or restrictive lung disease.  He does have a history of hiatal hernia, with possible contribution to his symptoms.  We have discussed intermittent use of alprazolam  during episodes for possible anxiety component which have helped.  There was some confusion previously with use of sertraline  versus alprazolam  and in May recommended starting the sertraline  daily with as needed alprazolam  for breakthrough symptoms.  We also discussed option to increase the low-dose sertraline  to 50 mg if well-tolerated and any persistent anxiety symptoms. Has been taking sertraline  25mg  daily - once per day. More relaxed. Has not needed alprazolam  since last visit. No ER visit since last appt.   In regards to his dyspnea and hiatal hernia, he did have a visit 1 week ago with general surgery, Dr. Jordis.  Prior endoscopy indicating 3 cm hiatal hernia with gastritis, antral erythema.  Sliding hiatal hernia, with reflux, thought to be causing hyperactivity and airway.  Intermittent dyspnea correlated with reflux episodes and when eating large meals per surgery note.  Surgeon did think that the reflux was contributing to the pulmonary symptoms, and patient did want to proceed with surgical treatment, after  visiting [place] to visit his mother with planned repair in the next few months. Trip to [place] delayed with current instability in the region.   Does ok with smaller portions, smaller meals. Less symptoms recently.  Has been using CPAP nightly.   New concern  - hand/foot numbness.  Sometimes in middle of night, wakes up with numbness in one or other hand or either foot -  or feet, but that resolves with movement, not occurring during the day. Noted past month and a half since using CPAP.   Other new concern: Low heart rate  - past few months. HR in 50's at rest - watching TV, no symptoms, no syncope/near-syncope, lightheadedness or dizziness. Noted with checking blood pressure.  On toprol  xl.  Saw Dr. Mona on 5/16 - HR 59. Sinus bradycardia on EKG.  myoview  stress test and echo - no ischemia on stress test. Normal systolic/diastolic function on echo, mild AI -  On Repatha  since April - every 2 weeks. NMR, lipid profile pending to repeat test with cardiology.  Hypokalemia 3.4 on 07/11/23.     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   Chronic anticoagulation 08/26/2021   Internal hemorrhoid, bleeding 08/26/2021   Prolapsed internal hemorrhoids, grade 3 08/26/2021   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 coronary artery disease 08/26/2021   NSTEMI (non-ST elevated myocardial infarction) (HCC) 07/20/2021  CAD (coronary artery disease) 07/18/2021   Hyperlipidemia 07/18/2021   Unstable angina (HCC)    Non-recurrent acute serous otitis media of right ear 10/14/2018   Shortness of breath 11/12/2016   Chest pain 11/12/2016   Heart palpitations 10/20/2013   Situational anxiety 10/20/2013   Migraine with visual aura 08/24/2013   TIA (transient ischemic attack) 08/23/2013    Blurred vision 08/23/2013   Anomia 08/23/2013   Past Medical History:  Diagnosis Date   CAD (coronary artery disease)    Complicated migraine    Deafness in left ear    Diverticulosis    External hemorrhoids    Headache    Hypertension    Internal hemorrhoids    Palpitations    Stroke (HCC)    TIA (transient ischemic attack)    Tubular adenoma of colon    Vision abnormalities    Past Surgical History:  Procedure Laterality Date   APPENDECTOMY  12/01/11   lap appy   CORONARY STENT INTERVENTION N/A 07/17/2021   Procedure: CORONARY STENT INTERVENTION;  Surgeon: Verlin Lonni BIRCH, MD;  Location: [facility];  Service: Cardiovascular;  Laterality: N/A;   LAPAROSCOPIC APPENDECTOMY  12/02/2011   Procedure: APPENDECTOMY LAPAROSCOPIC;  Surgeon: Redell Faith, DO;  Location: [facility];  Service: General;  Laterality: N/A;   LEFT HEART CATH AND CORONARY ANGIOGRAPHY N/A 07/17/2021   Procedure: LEFT HEART CATH AND CORONARY ANGIOGRAPHY;  Surgeon: Verlin Lonni BIRCH, MD;  Location: [facility];  Service: Cardiovascular;  Laterality: N/A;   Allergies  Allergen Reactions   Wellbutrin [Bupropion Hcl] Itching and Rash   Prior to Admission medications   Medication Sig Start Date End Date Taking? Authorizing Provider  ALPRAZolam  (XANAX ) 0.25 MG tablet Take 1-2 tablets (0.25-0.5 mg total) by mouth 2 (two) times daily as needed for anxiety. 07/27/23  Yes Levora Reyes SAUNDERS, MD  aspirin  EC 81 MG tablet Take 81 mg by mouth daily. Swallow whole.   Yes [provider]  Evolocumab  (REPATHA  SURECLICK) 140 MG/ML SOAJ Inject 140 mg into the skin every 14 (fourteen) days. 07/16/23  Yes Hilty, Vinie BROCKS, MD  metoprolol  succinate (TOPROL -XL) 25 MG 24 hr tablet TAKE 1 TABLET (25 MG TOTAL) BY MOUTH DAILY. 07/29/23  Yes Hilty, Vinie BROCKS, MD  pantoprazole  (PROTONIX ) 40 MG tablet Take 1 tablet (40 mg total) by mouth 2 (two) times daily. 07/07/23 07/06/24 Yes Kasa, Kurian, MD  rosuvastatin   (CRESTOR ) 20 MG tablet TAKE 1 TABLET BY MOUTH EVERY DAY 07/16/23  Yes Hilty, Vinie BROCKS, MD  sertraline  (ZOLOFT ) 25 MG tablet Take 1 tablet (25 mg total) by mouth daily. 06/29/23  Yes Levora Reyes SAUNDERS, MD   Social History   Socioeconomic History   Marital status: Divorced    Spouse name: Not on file   Number of children: Not on file   Years of education: Not on file   Highest education level: Not on file  Occupational History   Occupation: part time  Tobacco Use   Smoking status: Never    Passive exposure: Never   Smokeless tobacco: Never  Vaping Use   Vaping status: Never Used  Substance and Sexual Activity   Alcohol use: No   Drug use: No   Sexual activity: Not Currently  Other Topics Concern   Not on file  Social History Narrative   Lives alone   Right Handed   Drinks very little caffeine   Social Drivers of Health   Financial Resource Strain: Not on file  Food Insecurity: No Food Insecurity (02/26/2023)   Hunger Vital Sign    Worried About Running Out of Food in the Last Year: Never true    Ran Out of Food in the Last Year: Never true  Transportation Needs: No Transportation Needs (02/26/2023)   PRAPARE - Administrator, Civil Service (Medical): No    Lack of Transportation (Non-Medical): No  Physical Activity: Not on file  Stress: Not on file  Social Connections: Not on file  Intimate Partner Violence: Not At Risk (03/11/2023)   Received from [facility]   HITS    Over the last 12 months how often did your partner physically hurt you?: Never    Over the last 12 months how often did your partner insult you or talk down to you?: Never    Over the last 12 months how often did your partner threaten you with physical harm?: Never    Over the last 12 months how often did your partner scream or curse at you?: Never    Review of Systems   Objective:   Vitals:   09/09/23 1321 09/09/23 1327  BP: 120/82   Pulse: (!) 48 68  Resp: 18   SpO2: 96%    Weight: 169 lb 9.6 oz (76.9 kg)   Height: 5' 6 (1.676 m)      Physical Exam Vitals reviewed.  Constitutional:      Appearance: He is well-developed.  HENT:     Head: Normocephalic and atraumatic.  Neck:     Vascular: No carotid bruit or JVD.   Cardiovascular:     Rate and Rhythm: Normal rate and regular rhythm.     Heart sounds: Normal heart sounds. No murmur heard. Pulmonary:     Effort: Pulmonary effort is normal.     Breath sounds: Normal breath sounds. No rales.   Musculoskeletal:     Right lower leg: No edema.     Left lower leg: No edema.   Skin:    General: Skin is warm and dry.   Neurological:     General: No focal deficit present.     Mental Status: He is alert and oriented to person, place, and time.     Comments: Equal upper extremity, lower extremity strength, sensation intact to fingertips.  Negative Tinel's at wrist, elbow, lateral neck/brachial plexus.  Unable to reproduce symptoms with neck range of motion but does have slight decreased extension, rotation.  Psychiatric:        Mood and Affect: Mood normal.     Assessment & Plan:  Koben Daman is a 63 y.o. male . Hypokalemia - Plan: Basic metabolic panel with GFR  Situational anxiety  Dyspnea, unspecified type  Hiatal hernia  Sinus bradycardia  Dysesthesia  Anxiety symptoms appear to be improved given less need for alprazolam .  Still gave him the option to increase to 50 mg dosing of sertraline  as well-tolerated. Still appears to be have a hernia as contributor to his respiratory symptoms given decreased symptoms with smaller meals, in association with larger meals, sliding hernia mechanism may be responsible for previous imaging questions.  Surgery recommendations reviewed with repair which she intends to proceed with in the next few months.  Continue to monitor.  Recommended he discuss any changes in his beta-blocker with cardiology given the bradycardia but does not appear to be  symptomatic.  Additionally recommend he discuss the NMR LipoProfile, other testing with his lipid specialist.  Dysesthesias likely related to neck or  back position with sleeping as asymptomatic in office and only noted at nighttime with quick resolution in the morning.  Adjustment of sleep position and pillow initially discussed with RTC precautions, option of neuro eval.  Borderline hyperkalemia, repeat testing.  Adjust plan accordingly.   No orders of the defined types were placed in this encounter.  Patient Instructions  Thanks for coming in today. You have the option of increasing the sertraline  to 50 mg/day or 2 pills/day to see if that helps with anxiety symptoms further but I am encouraged that you have not required alprazolam  recently.  Continue follow-up with general surgeon regarding the hiatal hernia repair.  Continue small meals if that lessens the shortness of breath symptoms.  Tingling/numbness in hands or feet could be related to position of sleep.  I am encouraged that does resolve quickly with movement and you do not have daytime symptoms.  You can try using a different pillow or changing position of sleep to see if that helps.  Follow-up if those symptoms persist.  Potassium was borderline low on your most recent labs, I am checking those again today.  Please contact your cardiologist about the repeat NMR LipoProfile and lipid panel testing as I would want them to make decisions on any changes based on those results.  I would also discussed the low heart rate with cardiology but that is likely due to your metoprolol  and would recommend discussions of changing that medicine or adjusting that dose of medicine with cardiology.  I am encouraged that you are not having symptoms with the lower heart rates, but again discuss that further with cardiology.  Return to the clinic or go to the nearest emergency room if any of your symptoms worsen or new symptoms occur.  Take  care!    Signed,   Reyes Pines, MD [facility], [facility] 09/09/23 2:30 PM

## 2023-09-09 NOTE — Patient Instructions (Addendum)
 Thanks for coming in today. You have the option of increasing the sertraline  to 50 mg/day or 2 pills/day to see if that helps with anxiety symptoms further but I am encouraged that you have not required alprazolam  recently.  Continue follow-up with general surgeon regarding the hiatal hernia repair.  Continue small meals if that lessens the shortness of breath symptoms.  Tingling/numbness in hands or feet could be related to position of sleep.  I am encouraged that does resolve quickly with movement and you do not have daytime symptoms.  You can try using a different pillow or changing position of sleep to see if that helps.  Follow-up if those symptoms persist.  Potassium was borderline low on your most recent labs, I am checking those again today.  Please contact your cardiologist about the repeat NMR LipoProfile and lipid panel testing as I would want them to make decisions on any changes based on those results.  I would also discussed the low heart rate with cardiology but that is likely due to your metoprolol  and would recommend discussions of changing that medicine or adjusting that dose of medicine with cardiology.  I am encouraged that you are not having symptoms with the lower heart rates, but again discuss that further with cardiology.  Return to the clinic or go to the nearest emergency room if any of your symptoms worsen or new symptoms occur.  Take care!

## 2023-09-10 LAB — BASIC METABOLIC PANEL WITH GFR
BUN: 13 mg/dL (ref 6–23)
CO2: 29 meq/L (ref 19–32)
Calcium: 9.2 mg/dL (ref 8.4–10.5)
Chloride: 106 meq/L (ref 96–112)
Creatinine, Ser: 0.94 mg/dL (ref 0.40–1.50)
GFR: 87.57 mL/min (ref >60.00)
Glucose, Bld: 97 mg/dL (ref 70–99)
Potassium: 4.3 meq/L (ref 3.5–5.1)
Sodium: 139 meq/L (ref 135–145)

## 2023-09-11 ENCOUNTER — Other Ambulatory Visit (HOSPITAL_COMMUNITY): Payer: Self-pay

## 2023-09-11 ENCOUNTER — Encounter: Payer: Self-pay | Admitting: Family Medicine

## 2023-09-11 ENCOUNTER — Ambulatory Visit: Payer: Self-pay | Admitting: Family Medicine

## 2023-09-16 ENCOUNTER — Ambulatory Visit: Admitting: Internal Medicine

## 2023-09-16 ENCOUNTER — Other Ambulatory Visit (HOSPITAL_COMMUNITY)

## 2023-09-16 NOTE — Progress Notes (Deleted)
 [facility] Pulmonary Medicine Consultation      Date: 09/16/2023,   MRN# [id number] Todd [name] January 07, 1961  Synopsis Patient was referred to [facility] pulmonary for abnormal breathing found to have significant obstructive airways disease on initial pulmonary function testing in January 2025 however there was significant discrepancy in his clinical symptoms and pulmonary function testing and a repeat pulmonary function testing March 2025 showed normal findings. Patient was also assessed for sleep apnea during this period and was found to have an AHI of 10 in the supine position  Pulmonary function testing reviewed in detail with the patient Overall, no significant findings on repeat pulmonary function testing Findings reviewed below Pulmonary function test April 17, 2023 Findings reported to patient in detail Postbronchodilator FEV1 FVC ratio was 41% predicted FEV1 is 51% predicted No significant bronchodilator response Total lung capacity 93% predicted Elevated RV and RV/TLC ratio consistent with hyperinflation and air trapping DLCO within normal limits flow-volume loops show restrictive pattern Expiratory limb shows blunting and scooping Overall findings suggest severe obstructive lung disease  Pulmonary function test June 09, 2023 Findings reported to patient in detail Postbronchodilator FEV1 FVC ratio is 80% predicted FEV1 is 104% predicted FVC is 98% predicted Total lung capacity is 100% predicted RV and RV/TLC ratio is 107% predicted DLCO within normal limits Flow-volume loops show mild obstructive scooping of the expiratory limb Overall findings normal pulmonary function testing No significant abnormality seen  There was  no correlation between symptoms and the pulmonary function testing There is a significant discrepancy in his clinical exam and physical findings and pulmonary function testing,Therefore a repeat pulmonary function test was obtained which showed  no significant obstructive or restrictive disease Current pulmonary function test in March 2025 indicates normal pulmonary function testing    CHIEF COMPLAINT:   Follow-up assessment for OSA Follow-up assessment for shortness of breath  HISTORY OF PRESENT ILLNESS   Follow-up assessment for shortness of breath No significant respiratory findings at this time No exacerbation at this time No evidence of heart failure at this time No evidence or signs of infection at this time No respiratory distress No fevers, chills, nausea, vomiting, diarrhea No evidence of lower extremity edema No evidence hemoptysis  Patient has excellent exercise capacity Walks 1 mile per day without any issues No significant respiratory compromise Non-smoker nonalcoholic Patient does cook often  There is a significant difference in pulmonary function test from January to March of this year  +GERD and Bloating-uncontrolled gas Recommend PPI    Patient's main complaint is that he has intermittent reflux and severe bloating He says that belching and burping relieves his abdominal pressure which then relieves his shortness of breath Patient has had colonoscopy to assess for bleeding and cancer   upper endoscopy for further evaluation +hiatel hernia  prescribed Protonix    Assessment OSA Split-night sleep study performed in March 2025 AHI 3% showed 7.7 AHI in the supine position 10.8 Based on review of data patient does have mild OSA Patient to be started on CPAP-already ordered Patient started CPAP to weeks ago Will need several more weeks of data and follow-up     PAST MEDICAL HISTORY   Past Medical History:  Diagnosis Date   CAD (coronary artery disease)    Complicated migraine    Deafness in left ear    Diverticulosis    External hemorrhoids    Headache    Hypertension    Internal hemorrhoids    Palpitations    Stroke (HCC)  TIA (transient ischemic attack)    Tubular adenoma of  colon    Vision abnormalities      SURGICAL HISTORY   Past Surgical History:  Procedure Laterality Date   APPENDECTOMY  12/01/11   lap appy   CORONARY STENT INTERVENTION N/A 07/17/2021   Procedure: CORONARY STENT INTERVENTION;  Surgeon: Verlin Lonni BIRCH, MD;  Location: [facility];  Service: Cardiovascular;  Laterality: N/A;   LAPAROSCOPIC APPENDECTOMY  12/02/2011   Procedure: APPENDECTOMY LAPAROSCOPIC;  Surgeon: Redell Faith, DO;  Location: [facility];  Service: General;  Laterality: N/A;   LEFT HEART CATH AND CORONARY ANGIOGRAPHY N/A 07/17/2021   Procedure: LEFT HEART CATH AND CORONARY ANGIOGRAPHY;  Surgeon: Verlin Lonni BIRCH, MD;  Location: [facility];  Service: Cardiovascular;  Laterality: N/A;     FAMILY HISTORY   Family History  Problem Relation Age of Onset   Hypertension Mother    Hypertension Father    Anemia Neg Hx    Arrhythmia Neg Hx    Asthma Neg Hx    Clotting disorder Neg Hx    Fainting Neg Hx    Heart attack Neg Hx    Heart disease Neg Hx    Heart failure Neg Hx    Hyperlipidemia Neg Hx    Colon cancer Neg Hx    Esophageal cancer Neg Hx    Stomach cancer Neg Hx    Rectal cancer Neg Hx    Colon polyps Neg Hx      SOCIAL HISTORY   Social History   Tobacco Use   Smoking status: Never    Passive exposure: Never   Smokeless tobacco: Never  Vaping Use   Vaping status: Never Used  Substance Use Topics   Alcohol use: No   Drug use: No     MEDICATIONS    Home Medication:  Current Outpatient Rx   Order #: [id number] Class: Normal   Order #: [id number] Class: Historical Med   Order #: [id number] Class: Normal   Order #: [id number] Class: Normal   Order #: [id number] Class: Normal   Order #: [id number] Class: Normal   Order #: [id number] Class: Normal    Current Medication:  Current Outpatient Medications:    ALPRAZolam  (XANAX ) 0.25 MG tablet, Take 1-2 tablets (0.25-0.5 mg total) by mouth 2 (two) times daily as needed for anxiety.,  Disp: 30 tablet, Rfl: 0   aspirin  EC 81 MG tablet, Take 81 mg by mouth daily. Swallow whole., Disp: , Rfl:    Evolocumab  (REPATHA  SURECLICK) 140 MG/ML SOAJ, Inject 140 mg into the skin every 14 (fourteen) days., Disp: 2 mL, Rfl: 5   metoprolol  succinate (TOPROL -XL) 25 MG 24 hr tablet, TAKE 1 TABLET (25 MG TOTAL) BY MOUTH DAILY., Disp: 90 tablet, Rfl: 3   pantoprazole  (PROTONIX ) 40 MG tablet, Take 1 tablet (40 mg total) by mouth 2 (two) times daily., Disp: 60 tablet, Rfl: 11   rosuvastatin  (CRESTOR ) 20 MG tablet, TAKE 1 TABLET BY MOUTH EVERY DAY, Disp: 90 tablet, Rfl: 3   sertraline  (ZOLOFT ) 25 MG tablet, Take 1 tablet (25 mg total) by mouth daily., Disp: 30 tablet, Rfl: 3    ALLERGIES   Wellbutrin [bupropion hcl]  There were no vitals taken for this visit.    Review of Systems: Gen:  Denies  fever, sweats, chills weight loss  HEENT: Denies blurred vision, double vision, ear pain, eye pain, hearing loss, nose bleeds, sore throat Cardiac:  No dizziness, chest pain or heaviness, chest tightness,edema, No JVD Resp:   No  cough, -sputum production, -shortness of breath,-wheezing, -hemoptysis,  Other:  All other systems negative   Physical Examination:   General Appearance: No distress  EYES PERRLA, EOM intact.   NECK Supple, No JVD Pulmonary: normal breath sounds, No wheezing.  CardiovascularNormal S1,S2.  No m/r/g.   Abdomen: Benign, Soft, non-tender. Neurology UE/LE 5/5 strength, no focal deficits Ext pulses intact, cap refill intact ALL OTHER ROS ARE NEGATIVE      IMAGING    MYOCARDIAL PERFUSION/CT RAD READ Result Date: 08/21/2023 CLINICAL DATA:  This over-read does not include interpretation of cardiac or coronary anatomy or pathology. The myocardial perfusion interpretation by the cardiologist is attached. COMPARISON:  CT chest 07/11/2023 FINDINGS: Limited view of the lung parenchyma demonstrates no suspicious nodularity. Airways are normal. Limited view of the mediastinum  demonstrates no adenopathy. Esophagus normal. Limited view of the upper abdomen unremarkable. Limited view of the skeleton and chest wall is unremarkable. IMPRESSION: No significant extracardiac findings. Electronically Signed   By: Jackquline Boxer M.D.   On: 08/21/2023 15:09   MYOCARDIAL PERFUSION IMAGING Addendum Date: 08/18/2023   The study is normal. The study is low risk.   No ST deviation was noted.   LV perfusion is normal. There is no evidence of ischemia. There is no evidence of infarction.   Left ventricular function is normal. Nuclear stress EF: 79%. The left ventricular ejection fraction is hyperdynamic (>65%). End diastolic cavity size is normal. End systolic cavity size is normal. No evidence of transient ischemic dilation (TID) noted.   CT images were obtained for attenuation correction and were examined for the presence of coronary calcium  when appropriate.   Coronary calcium  assessment not performed due to prior revascularization.   Prior study available for comparison from 07/25/2014. No changes compared to prior study.   Result Date: 08/18/2023   The study is normal. The study is low risk.   No ST deviation was noted.   LV perfusion is normal. There is no evidence of ischemia. There is no evidence of infarction.   Left ventricular function is normal. Nuclear stress EF: 79%. The left ventricular ejection fraction is hyperdynamic (>65%). End diastolic cavity size is normal. End systolic cavity size is normal. No evidence of transient ischemic dilation (TID) noted.   CT images were obtained for attenuation correction and were examined for the presence of coronary calcium  when appropriate.   Coronary calcium  assessment not performed due to prior revascularization.   Prior study available for comparison from 07/25/2014. No changes compared to prior study.      ASSESSMENT/PLAN   63 year old [place] male seen today for signs symptoms of obstructive sleep apnea with HST showing mild OSA with AHI  between 7 and 10 based on position with intermittent shortness of breath likely related to abdominal symptoms of belching and burping with bloating His repeat pulmonary function tests are within normal limits no significant findings for obstructive or restrictive lung disease which was noted on previous pulmonary function test Patient has very good exercise capacity without any issues  Previous history of abnormal PFTs Definitely related to abdominal symptoms Increased bloating related to diet which causes his shortness of breath His shortness of breath is definitely reactive to what he eats All of his symptoms are related to his GI tract Most recent pulmonary function test are within normal limits No significant respiratory issues or obstructive or restrictive pattern lung disease Patient with very good exercise capacity  Assessment of OSA Follow-up in 1 month with CPAP compliance report  Hiatal hernia and reflux Refill Protonix  Recommend referral to [facility] to assess for hernia repair Follow-up with general surgery recommendations Highly consider hernia repair    MEDICATION ADJUSTMENTS/LABS AND TESTS ORDERED: Protonix  General Surgery-hernia assessment  [facility]-Dr Pabon Avoid Allergens and Irritants Avoid secondhand smoke Avoid SICK contacts Recommend  Masking  when appropriate Recommend Keep up-to-date with vaccinations Dietary changes recommended   CURRENT MEDICATIONS REVIEWED AT LENGTH WITH PATIENT TODAY   Patient  satisfied with Plan of action and management. All questions answered   Follow up 6 months   I spent a total of 47  minutes reviewing chart data, face-to-face evaluation with the patient, counseling and coordination of care as detailed above.      Nickolas Alm Cellar, M.D.  [facility]  Medical Director [facility] Medical Director [facility] Cardio-Pulmonary  Department

## 2023-09-23 ENCOUNTER — Ambulatory Visit: Admitting: Nurse Practitioner

## 2023-09-24 ENCOUNTER — Ambulatory Visit: Payer: Self-pay | Admitting: Internal Medicine

## 2023-09-24 LAB — NMR, LIPOPROFILE
Cholesterol, Total: 119 mg/dL (ref 100–199)
HDL Particle Number: 40.4 umol/L (ref >=30.5)
HDL-C: 54 mg/dL (ref >39)
LDL Particle Number: 501 nmol/L (ref <1000)
LDL Size: 20.1 nm — ABNORMAL LOW (ref >20.5)
LDL-C (NIH Calc): 46 mg/dL (ref 0–99)
LP-IR Score: 51 — ABNORMAL HIGH (ref <=45)
Small LDL Particle Number: 338 nmol/L (ref <=527)
Triglycerides: 103 mg/dL (ref 0–149)

## 2023-10-08 ENCOUNTER — Other Ambulatory Visit (HOSPITAL_COMMUNITY): Payer: Self-pay

## 2023-10-27 ENCOUNTER — Other Ambulatory Visit: Payer: Self-pay | Admitting: Family Medicine

## 2023-10-27 ENCOUNTER — Other Ambulatory Visit: Payer: Self-pay | Admitting: Internal Medicine

## 2023-10-27 DIAGNOSIS — R140 Abdominal distension (gaseous): Secondary | ICD-10-CM

## 2023-10-28 ENCOUNTER — Ambulatory Visit (INDEPENDENT_AMBULATORY_CARE_PROVIDER_SITE_OTHER): Admitting: Family Medicine

## 2023-10-28 ENCOUNTER — Encounter: Payer: Self-pay | Admitting: Family Medicine

## 2023-10-28 VITALS — BP 122/80 | HR 51 | Temp 98.0°F | Ht 65.0 in | Wt 169.0 lb

## 2023-10-28 DIAGNOSIS — Z1159 Encounter for screening for other viral diseases: Secondary | ICD-10-CM

## 2023-10-28 DIAGNOSIS — R42 Dizziness and giddiness: Secondary | ICD-10-CM | POA: Diagnosis not present

## 2023-10-28 DIAGNOSIS — Z8669 Personal history of other diseases of the nervous system and sense organs: Secondary | ICD-10-CM

## 2023-10-28 DIAGNOSIS — Z0000 Encounter for general adult medical examination without abnormal findings: Secondary | ICD-10-CM

## 2023-10-28 DIAGNOSIS — R7303 Prediabetes: Secondary | ICD-10-CM

## 2023-10-28 DIAGNOSIS — E785 Hyperlipidemia, unspecified: Secondary | ICD-10-CM

## 2023-10-28 DIAGNOSIS — F418 Other specified anxiety disorders: Secondary | ICD-10-CM | POA: Diagnosis not present

## 2023-10-28 DIAGNOSIS — G4733 Obstructive sleep apnea (adult) (pediatric): Secondary | ICD-10-CM

## 2023-10-28 DIAGNOSIS — Z125 Encounter for screening for malignant neoplasm of prostate: Secondary | ICD-10-CM

## 2023-10-28 LAB — HEMOGLOBIN A1C: Hgb A1c MFr Bld: 5.9 % (ref 4.6–6.5)

## 2023-10-28 LAB — PSA: PSA: 1.13 ng/mL (ref 0.10–4.00)

## 2023-10-28 MED ORDER — MECLIZINE HCL 25 MG PO TABS: 25.0000 mg | ORAL_TABLET | Freq: Three times a day (TID) | ORAL | 0 refills | Status: AC | PRN

## 2023-10-28 MED ORDER — SERTRALINE HCL 25 MG PO TABS: 25.0000 mg | ORAL_TABLET | Freq: Every day | ORAL | 1 refills | Status: AC

## 2023-10-28 NOTE — Progress Notes (Signed)
 Subjective:  Patient ID: Todd [name], male    DOB: 06/05/1960  Age: 63 y.o. MRN: [id number]  CC:  Chief Complaint  Patient presents with   Annual Exam    Physical    Ear Fullness    Patient states he can not hear out of let ear at all with some dizzy spells. Patient wondering if he can have referral for ENT.     HPI [facility] presents for Annual Exam and additional concern.   PCP, [facility] Cardiology, Dr. Mona, CAD status post PCI to the mid LAD with DES in April 2023.  Office visit in May.  Updated echocardiogram and Myoview  stress test ordered at that time, stress test was negative without ischemia, normal LV function.  Normal systolic, diastolic function on echo.  Mild AI, unchanged from 2023.  Treated with Repatha , NMR LipoProfile July 3 improved with the addition of Repatha , LDL 46.  History of sinus bradycardia. Pulmonary, Dr. Isaiah appointment May 28.  Initial pulmonary testing in January 2025 with concern for obstructive airway disease, repeat testing in March 2025 with normal findings.  Sleep apnea testing with AHI of 10 in the supine position.  CPAP ordered.  Prior testing for recurrent episodes of dyspnea, no significant findings on repeat pulmonary function testing.  Excellent exercise capacity. Using CPAP nightly - rare missed night. Doing well.  General surgery, Dr. Jordis, history of hiatal hernia.  Appointment noted in June.  Dyspnea symptoms have been food associated, possible hyperactivity and irritation from hiatal hernia with reflux possibly contributing to his pulmonary symptoms.  Plan for surgical treatment of sliding hiatal hernia, timing dependent on trip [place]. Appt with surgeon in few days to discuss timing of surgery - only brief mild shortness of breath with eating - less than in past. On protonix  daily.   Anxiety Treated with sertraline  25 mg daily, had not needed alprazolam  recently when discussed at his June visit.  Was feeling better on this  medication.  Option of 50 mg dosing if needed. Feels like sertraline  25mg  has been working well. No new side effects with meds. No recent need for xanax .   New concern of dizziness: Started about a year ago. Similar sx's 35 years ago - improved with meds - diagnosed with meniere's disease. Unknown treatment, but resolved.   Symptoms returned about a year ago - feels like room spinning, noticed with turning from one side to another at times in bed. Noticed with driving or other times when not moving head - sensation of spinning, dizziness. Closes eyes or grabs on to something. Lasts few seconds up to 30 min. No vomiting. No associated HA, focal weakness. Sometimes feels discomfort in lower stomach after episode, lasting about a minute then resolves on own. No current abdominal pain. Called specialist  - needs referral. Chronic tinnitus - varying levels of sound since young age. Prior hearing loss in left ear at 63yo.   Tx: none.   Prediabetes: No meds. Due for repeat testing.  Lab Results  Component Value Date   HGBA1C 5.8 07/10/2022   Wt Readings from Last 3 Encounters:  10/28/23 169 lb (76.7 kg)  09/09/23 169 lb 9.6 oz (76.9 kg)  09/02/23 170 lb (77.1 kg)   Declines STI screening.       10/28/2023    1:00 PM 07/27/2023   12:52 PM 06/29/2023   10:12 AM 07/10/2022    1:27 PM 05/07/2022   10:53 AM  Depression screen PHQ 2/9  Decreased  Interest 0 0 0 0 0  Down, Depressed, Hopeless 0 0 0 0 0  PHQ - 2 Score 0 0 0 0 0  Altered sleeping 0 0 0 0 0  Tired, decreased energy 0 1 0 0 0  Change in appetite 0  0 0 0  Feeling bad or failure about yourself  0 0 0 0 0  Trouble concentrating 0 0 0 0 0  Moving slowly or fidgety/restless 0 0 0 0 0  Suicidal thoughts 0 0 0 0 0  PHQ-9 Score 0 1 0 0 0  Difficult doing work/chores Not difficult at all   Not difficult at all     Health Maintenance  Topic Date Due   Hepatitis C Screening  Never done   DTaP/Tdap/Td (1 - Tdap) Never done   Pneumococcal  Vaccine: 19-49 Years (1 of 2 - PCV) Never done   Pneumococcal Vaccine: 50+ Years (1 of 2 - PCV) Never done   Zoster Vaccines- Shingrix (1 of 2) Never done   COVID-19 Vaccine (3 - 2024-25 season) 11/23/2022   INFLUENZA VACCINE  10/23/2023   Colonoscopy  07/03/2029   HIV Screening  Completed   Hepatitis B Vaccines  Aged Out   HPV VACCINES  Aged Out   Meningococcal B Vaccine  Aged Out  Colonoscopy 07/04/19, Dr. Albertus, repeat 10 years.  Hemorrhoids noted, option to have hemorrhoidectomy with colorectal surgeon. No surgery - uses otc hemorrhoid cream if needed.  Prostate: does not have family history of prostate cancer The natural history of prostate cancer and ongoing controversy regarding screening and potential treatment outcomes of prostate cancer has been discussed with the patient. The meaning of a false positive PSA and a false negative PSA has been discussed. He indicates understanding of the limitations of this screening test and wishes to proceed with screening PSA testing. Lab Results  Component Value Date   PSA1 1.2 09/19/2019   PSA 1.18 07/10/2022   PSA 1.00 08/12/2021   PSA 1.00 06/17/2014  Agrees to Hep C screening.   Immunization History  Administered Date(s) Administered   PFIZER Comirnaty(Gray Top)Covid-19 Tri-Sucrose Vaccine 08/06/2019, 08/27/2019  Flu vaccine and covid booster declined.  Also declines PNA and shingles vaccines.   No results found. Optho 1 year ago - no vision changes.   Dental: 1 year ago.   Alcohol: none  Tobacco: none  Exercise: none.  Concerned about prior breathing problems when exercised prior.    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   Chronic anticoagulation 08/26/2021   Internal hemorrhoid, bleeding 08/26/2021   Prolapsed internal hemorrhoids,  grade 3 08/26/2021   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 coronary artery disease 08/26/2021   NSTEMI (non-ST elevated myocardial infarction) (HCC) 07/20/2021   CAD (coronary artery disease) 07/18/2021   Hyperlipidemia 07/18/2021   Unstable angina (HCC)    Non-recurrent acute serous otitis media of right ear 10/14/2018   Shortness of breath 11/12/2016   Chest pain 11/12/2016   Heart palpitations 10/20/2013   Situational anxiety 10/20/2013   Migraine with visual aura 08/24/2013   TIA (transient ischemic attack) 08/23/2013   Blurred vision 08/23/2013   Anomia 08/23/2013   Past Medical History:  Diagnosis Date   CAD (coronary artery disease)    Complicated migraine    Deafness  in left ear    Diverticulosis    External hemorrhoids    Headache    Hypertension    Internal hemorrhoids    Palpitations    Stroke [facility])    TIA (transient ischemic attack)    Tubular adenoma of colon    Vision abnormalities    Past Surgical History:  Procedure Laterality Date   APPENDECTOMY  12/01/11   lap appy   CORONARY STENT INTERVENTION N/A 07/17/2021   Procedure: CORONARY STENT INTERVENTION;  Surgeon: Verlin Lonni BIRCH, MD;  Location: [facility] INVASIVE CV LAB;  Service: Cardiovascular;  Laterality: N/A;   LAPAROSCOPIC APPENDECTOMY  12/02/2011   Procedure: APPENDECTOMY LAPAROSCOPIC;  Surgeon: Redell Faith, DO;  Location: [facility] ORS;  Service: General;  Laterality: N/A;   LEFT HEART CATH AND CORONARY ANGIOGRAPHY N/A 07/17/2021   Procedure: LEFT HEART CATH AND CORONARY ANGIOGRAPHY;  Surgeon: Verlin Lonni BIRCH, MD;  Location: [facility] INVASIVE CV LAB;  Service: Cardiovascular;  Laterality: N/A;   Allergies  Allergen Reactions   Wellbutrin [Bupropion Hcl] Itching and Rash   Prior to Admission medications   Medication Sig Start Date End Date Taking? Authorizing Provider  ALPRAZolam  (XANAX ) 0.25 MG tablet Take 1-2 tablets (0.25-0.5 mg total) by mouth 2 (two) times daily  as needed for anxiety. 07/27/23   Levora Todd SAUNDERS, MD  aspirin  EC 81 MG tablet Take 81 mg by mouth daily. Swallow whole.    [provider]  Evolocumab  (REPATHA  SURECLICK) 140 MG/ML SOAJ Inject 140 mg into the skin every 14 (fourteen) days. 07/16/23   Hilty, Vinie BROCKS, MD  metoprolol  succinate (TOPROL -XL) 25 MG 24 hr tablet TAKE 1 TABLET (25 MG TOTAL) BY MOUTH DAILY. 07/29/23   Hilty, Vinie BROCKS, MD  pantoprazole  (PROTONIX ) 40 MG tablet TAKE 1 TABLET BY MOUTH EVERY DAY 10/27/23   Kasa, Kurian, MD  rosuvastatin  (CRESTOR ) 20 MG tablet TAKE 1 TABLET BY MOUTH EVERY DAY 07/16/23   Hilty, Vinie BROCKS, MD  sertraline  (ZOLOFT ) 25 MG tablet TAKE 1 TABLET (25 MG TOTAL) BY MOUTH DAILY. 10/27/23   Levora Todd SAUNDERS, MD   Social History   Socioeconomic History   Marital status: Divorced    Spouse name: Not on file   Number of children: Not on file   Years of education: Not on file   Highest education level: Not on file  Occupational History   Occupation: part time  Tobacco Use   Smoking status: Never    Passive exposure: Never   Smokeless tobacco: Never  Vaping Use   Vaping status: Never Used  Substance and Sexual Activity   Alcohol use: No   Drug use: No   Sexual activity: Not Currently  Other Topics Concern   Not on file  Social History Narrative   Lives alone   Right Handed   Drinks very little caffeine   Social Drivers of Health   Financial Resource Strain: Not on file  Food Insecurity: No Food Insecurity (02/26/2023)   Hunger Vital Sign    Worried About Running Out of Food in the Last Year: Never true    Ran Out of Food in the Last Year: Never true  Transportation Needs: No Transportation Needs (02/26/2023)   PRAPARE - Administrator, Civil Service (Medical): No    Lack of Transportation (Non-Medical): No  Physical Activity: Not on file  Stress: Not on file  Social Connections: Not on file  Intimate Partner Violence: Not At Risk (03/11/2023)   Received from [facility]  HITS    Over the last 12 months how often did your partner physically hurt you?: Never    Over the last 12 months how often did your partner insult you or talk down to you?: Never    Over the last 12 months how often did your partner threaten you with physical harm?: Never    Over the last 12 months how often did your partner scream or curse at you?: Never    Review of Systems   Objective:   Vitals:   10/28/23 1256  BP: 122/80  Pulse: (!) 51  Temp: 98 F (36.7 C)  TempSrc: Temporal  SpO2: 99%  Weight: 169 lb (76.7 kg)  Height: 5' 5 (1.651 m)     Physical Exam Vitals reviewed.  Constitutional:      Appearance: He is well-developed.  HENT:     Head: Normocephalic and atraumatic.     Right Ear: Tympanic membrane, ear canal and external ear normal.     Left Ear: Tympanic membrane, ear canal and external ear normal.     Ears:     Comments: Canals clear.  No significant cerumen appreciated.  No appreciable fluid behind TMs or erythema/injection. Eyes:     Conjunctiva/sclera: Conjunctivae normal.     Pupils: Pupils are equal, round, and reactive to light.     Comments: No nystagmus  Neck:     Thyroid : No thyromegaly.  Cardiovascular:     Rate and Rhythm: Normal rate and regular rhythm.     Heart sounds: Normal heart sounds.  Pulmonary:     Effort: Pulmonary effort is normal. No respiratory distress.     Breath sounds: Normal breath sounds. No wheezing.  Abdominal:     General: There is no distension.     Palpations: Abdomen is soft.     Tenderness: There is no abdominal tenderness.  Musculoskeletal:        General: No tenderness. Normal range of motion.     Cervical back: Normal range of motion and neck supple.  Lymphadenopathy:     Cervical: No cervical adenopathy.  Skin:    General: Skin is warm and dry.  Neurological:     General: No focal deficit present.     Mental Status: He is alert and oriented to person, place, and time.     GCS: GCS eye  subscore is 4. GCS verbal subscore is 5. GCS motor subscore is 6.     Cranial Nerves: No dysarthria or facial asymmetry.     Sensory: Sensation is intact.     Motor: Motor function is intact.     Coordination: Coordination is intact. Romberg sign negative. Coordination normal. Finger-Nose-Finger Test and Heel to [facility] Test normal. Rapid alternating movements normal.     Gait: Gait is intact.     Deep Tendon Reflexes: Reflexes are normal and symmetric.     Comments: Nonfocal exam, asymptomatic at present.  Psychiatric:        Behavior: Behavior normal.     Assessment & Plan:  Todd [name] is a 63 y.o. male . Annual physical exam  - -anticipatory guidance as below in AVS, screening labs above. Health maintenance items as above in HPI discussed/recommended as applicable.   Vertigo - Plan: meclizine  (ANTIVERT ) 25 MG tablet, Ambulatory referral to ENT History of Meniere disease - Plan: meclizine  (ANTIVERT ) 25 MG tablet, Ambulatory referral to ENT  - New concern, but symptoms have been intermittent over the past year.  Nonfocal neuroexam, but  asymptomatic in office.  History of Mnire's and based on description of symptoms it appears to be vertigo because of dizziness.  Previous CBC reassuring.  Normal BMP on June 18.  Meclizine  prescribed temporarily if needed, refer to ENT for further evaluation and treatment options.  RTC/ER precautions given.  Situational anxiety - Plan: sertraline  (ZOLOFT ) 25 MG tablet  - Stable with low-dose sertraline , refilled.  OSA (obstructive sleep apnea)  - Tolerating CPAP.  Continue follow-up with specialist  Hyperlipidemia, unspecified hyperlipidemia type  - Improved LDL on Repatha , followed by cardiology, specialist as above.  No changes  Prediabetes - Plan: Hemoglobin A1c  - Check updated A1c and adjust plan accordingly.  Screening for prostate cancer - Plan: PSA  Need for hepatitis C screening test - Plan: Hepatitis C antibody   Meds ordered  this encounter  Medications   meclizine  (ANTIVERT ) 25 MG tablet    Sig: Take 1 tablet (25 mg total) by mouth 3 (three) times daily as needed for dizziness.    Dispense:  30 tablet    Refill:  0   sertraline  (ZOLOFT ) 25 MG tablet    Sig: Take 1 tablet (25 mg total) by mouth daily.    Dispense:  90 tablet    Refill:  1   Patient Instructions  Thank you for coming in today.  I will check a few lab tests as we discussed and let you know if there are any concerns.  I did refer you to ear nose and throat specialist for follow-up of the suspected vertigo and history of Mnire's disease.  Meclizine  was prescribed if needed at times of room spinning or dizziness to see if that helps.  Be seen if any new or worsening symptoms. Follow-up with me in 6 months to check in on meds, labs, but happy to see you sooner if needed.  Take care!  Preventive Care 86-25 Years Old, Male Preventive care refers to lifestyle choices and visits with your health care provider that can promote health and wellness. Preventive care visits are also called wellness exams. What can I expect for my preventive care visit? Counseling During your preventive care visit, your health care provider may ask about your: Medical history, including: Past medical problems. Family medical history. Current health, including: Emotional well-being. Home life and relationship well-being. Sexual activity. Lifestyle, including: Alcohol, nicotine or tobacco, and drug use. Access to firearms. Diet, exercise, and sleep habits. Safety issues such as seatbelt and bike helmet use. Sunscreen use. Work and work Astronomer. Physical exam Your health care provider will check your: Height and weight. These may be used to calculate your BMI (body mass index). BMI is a measurement that tells if you are at a healthy weight. Waist circumference. This measures the distance around your waistline. This measurement also tells if you are at a healthy  weight and may help predict your risk of certain diseases, such as type 2 diabetes and high blood pressure. Heart rate and blood pressure. Body temperature. Skin for abnormal spots. What immunizations do I need?  Vaccines are usually given at various ages, according to a schedule. Your health care provider will recommend vaccines for you based on your age, medical history, and lifestyle or other factors, such as travel or where you work. What tests do I need? Screening Your health care provider may recommend screening tests for certain conditions. This may include: Lipid and cholesterol levels. Diabetes screening. This is done by checking your blood sugar (glucose) after you have not  eaten for a while (fasting). Hepatitis B test. Hepatitis C test. HIV (human immunodeficiency virus) test. STI (sexually transmitted infection) testing, if you are at risk. Lung cancer screening. Prostate cancer screening. Colorectal cancer screening. Talk with your health care provider about your test results, treatment options, and if necessary, the need for more tests. Follow these instructions at home: Eating and drinking  Eat a diet that includes fresh fruits and vegetables, whole grains, lean protein, and low-fat dairy products. Take vitamin and mineral supplements as recommended by your health care provider. Do not drink alcohol if your health care provider tells you not to drink. If you drink alcohol: Limit how much you have to 0-2 drinks a day. Know how much alcohol is in your drink. In the [place] one drink equals one 12 oz bottle of beer (355 mL), one 5 oz glass of wine (148 mL), or one 1 oz glass of hard liquor (44 mL). Lifestyle Brush your teeth every morning and night with fluoride toothpaste. Floss one time each day. Exercise for at least 30 minutes 5 or more days each week. Do not use any products that contain nicotine or tobacco. These products include cigarettes, chewing tobacco, and  vaping devices, such as e-cigarettes. If you need help quitting, ask your health care provider. Do not use drugs. If you are sexually active, practice safe sex. Use a condom or other form of protection to prevent STIs. Take aspirin  only as told by your health care provider. Make sure that you understand how much to take and what form to take. Work with your health care provider to find out whether it is safe and beneficial for you to take aspirin  daily. Find healthy ways to manage stress, such as: Meditation, yoga, or listening to music. Journaling. Talking to a trusted person. Spending time with friends and family. Minimize exposure to UV radiation to reduce your risk of skin cancer. Safety Always wear your seat belt while driving or riding in a vehicle. Do not drive: If you have been drinking alcohol. Do not ride with someone who has been drinking. When you are tired or distracted. While texting. If you have been using any mind-altering substances or drugs. Wear a helmet and other protective equipment during sports activities. If you have firearms in your house, make sure you follow all gun safety procedures. What's next? Go to your health care provider once a year for an annual wellness visit. Ask your health care provider how often you should have your eyes and teeth checked. Stay up to date on all vaccines. This information is not intended to replace advice given to you by your health care provider. Make sure you discuss any questions you have with your health care provider. Document Revised: 09/05/2020 Document Reviewed: 09/05/2020 Elsevier Patient Education  2024 [employer]    Signed,   Todd Pines, MD [facility], [facility] 10/28/23 1:45 PM

## 2023-10-28 NOTE — Patient Instructions (Addendum)
 Thank you for coming in today.  I will check a few lab tests as we discussed and let you know if there are any concerns.  I did refer you to ear nose and throat specialist for follow-up of the suspected vertigo and history of Mnire's disease.  Meclizine  was prescribed if needed at times of room spinning or dizziness to see if that helps.  Be seen if any new or worsening symptoms. Follow-up with me in 6 months to check in on meds, labs, but happy to see you sooner if needed.  Take care!  Preventive Care 59-31 Years Old, Male Preventive care refers to lifestyle choices and visits with your health care provider that can promote health and wellness. Preventive care visits are also called wellness exams. What can I expect for my preventive care visit? Counseling During your preventive care visit, your health care provider may ask about your: Medical history, including: Past medical problems. Family medical history. Current health, including: Emotional well-being. Home life and relationship well-being. Sexual activity. Lifestyle, including: Alcohol, nicotine or tobacco, and drug use. Access to firearms. Diet, exercise, and sleep habits. Safety issues such as seatbelt and bike helmet use. Sunscreen use. Work and work Astronomer. Physical exam Your health care provider will check your: Height and weight. These may be used to calculate your BMI (body mass index). BMI is a measurement that tells if you are at a healthy weight. Waist circumference. This measures the distance around your waistline. This measurement also tells if you are at a healthy weight and may help predict your risk of certain diseases, such as type 2 diabetes and high blood pressure. Heart rate and blood pressure. Body temperature. Skin for abnormal spots. What immunizations do I need?  Vaccines are usually given at various ages, according to a schedule. Your health care provider will recommend vaccines for you based on  your age, medical history, and lifestyle or other factors, such as travel or where you work. What tests do I need? Screening Your health care provider may recommend screening tests for certain conditions. This may include: Lipid and cholesterol levels. Diabetes screening. This is done by checking your blood sugar (glucose) after you have not eaten for a while (fasting). Hepatitis B test. Hepatitis C test. HIV (human immunodeficiency virus) test. STI (sexually transmitted infection) testing, if you are at risk. Lung cancer screening. Prostate cancer screening. Colorectal cancer screening. Talk with your health care provider about your test results, treatment options, and if necessary, the need for more tests. Follow these instructions at home: Eating and drinking  Eat a diet that includes fresh fruits and vegetables, whole grains, lean protein, and low-fat dairy products. Take vitamin and mineral supplements as recommended by your health care provider. Do not drink alcohol if your health care provider tells you not to drink. If you drink alcohol: Limit how much you have to 0-2 drinks a day. Know how much alcohol is in your drink. In the [place] one drink equals one 12 oz bottle of beer (355 mL), one 5 oz glass of wine (148 mL), or one 1 oz glass of hard liquor (44 mL). Lifestyle Brush your teeth every morning and night with fluoride toothpaste. Floss one time each day. Exercise for at least 30 minutes 5 or more days each week. Do not use any products that contain nicotine or tobacco. These products include cigarettes, chewing tobacco, and vaping devices, such as e-cigarettes. If you need help quitting, ask your health care provider. Do not  use drugs. If you are sexually active, practice safe sex. Use a condom or other form of protection to prevent STIs. Take aspirin  only as told by your health care provider. Make sure that you understand how much to take and what form to take. Work with  your health care provider to find out whether it is safe and beneficial for you to take aspirin  daily. Find healthy ways to manage stress, such as: Meditation, yoga, or listening to music. Journaling. Talking to a trusted person. Spending time with friends and family. Minimize exposure to UV radiation to reduce your risk of skin cancer. Safety Always wear your seat belt while driving or riding in a vehicle. Do not drive: If you have been drinking alcohol. Do not ride with someone who has been drinking. When you are tired or distracted. While texting. If you have been using any mind-altering substances or drugs. Wear a helmet and other protective equipment during sports activities. If you have firearms in your house, make sure you follow all gun safety procedures. What's next? Go to your health care provider once a year for an annual wellness visit. Ask your health care provider how often you should have your eyes and teeth checked. Stay up to date on all vaccines. This information is not intended to replace advice given to you by your health care provider. Make sure you discuss any questions you have with your health care provider. Document Revised: 09/05/2020 Document Reviewed: 09/05/2020 Elsevier Patient Education  2024 [employer].

## 2023-10-29 ENCOUNTER — Ambulatory Visit: Payer: Self-pay | Admitting: Family Medicine

## 2023-10-29 LAB — HEPATITIS C ANTIBODY: Hepatitis C Ab: NONREACTIVE

## 2023-11-04 ENCOUNTER — Ambulatory Visit: Admitting: Surgery

## 2023-11-05 ENCOUNTER — Ambulatory Visit
Admission: EM | Admit: 2023-11-05 | Discharge: 2023-11-05 | Disposition: A | Discharge status: Home / Self Care | Attending: Family Medicine | Admitting: Family Medicine

## 2023-11-05 ENCOUNTER — Other Ambulatory Visit: Payer: Self-pay

## 2023-11-05 DIAGNOSIS — B349 Viral infection, unspecified: Secondary | ICD-10-CM

## 2023-11-05 MED ORDER — PROMETHAZINE-DM 6.25-15 MG/5ML PO SYRP
5.0000 mL | ORAL_SOLUTION | Freq: Three times a day (TID) | ORAL | 0 refills | Status: AC | PRN
Start: 2023-11-05 — End: ?

## 2023-11-05 MED ORDER — IPRATROPIUM BROMIDE 0.03 % NA SOLN: 2.0000 | Freq: Two times a day (BID) | NASAL | 0 refills | Status: AC

## 2023-11-05 NOTE — ED Provider Notes (Signed)
 [facility]    CSN: [id number] Arrival date & time: 11/05/23  1245      History   Chief Complaint No chief complaint on file.   HPI Todd [name] is a 63 y.o. male  presents for evaluation of URI symptoms for 5 days. Patient reports associated symptoms of cough, congestion, fatigue, intermittent sore throat. Denies N/V/D, fevers, ear pain, body aches, shortness of breath. Patient does not have a hx of asthma. Patient is not an active smoker.   Reports no sick contacts.  Pt has taken cough medicine OTC for symptoms. Pt has no other concerns at this time.   HPI  Past Medical History:  Diagnosis Date   CAD (coronary artery disease)    Complicated migraine    Deafness in left ear    Diverticulosis    External hemorrhoids    Headache    Hypertension    Internal hemorrhoids    Palpitations    Stroke [facility])    TIA (transient ischemic attack)    Tubular adenoma of colon    Vision abnormalities     Patient Active Problem List   Diagnosis Date Noted   Tonsillar abscess 02/24/2023   Sepsis (HCC) 02/24/2023   Hyperbilirubinemia 02/24/2023   Essential hypertension 02/24/2023   Hearing loss of left ear 06/02/2022   Chronic anticoagulation 08/26/2021   Internal hemorrhoid, bleeding 08/26/2021   Prolapsed internal hemorrhoids, grade 3 08/26/2021   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 coronary artery disease 08/26/2021   NSTEMI (non-ST elevated myocardial infarction) (HCC) 07/20/2021   CAD (coronary artery disease) 07/18/2021   Hyperlipidemia 07/18/2021   Unstable angina (HCC)    Non-recurrent acute serous otitis media of right ear 10/14/2018   Shortness of breath 11/12/2016   Chest pain 11/12/2016   Heart palpitations 10/20/2013   Situational anxiety 10/20/2013   Migraine with visual aura 08/24/2013   TIA (transient ischemic attack) 08/23/2013   Blurred vision 08/23/2013   Anomia 08/23/2013    Past Surgical History:   Procedure Laterality Date   APPENDECTOMY  12/01/11   lap appy   CORONARY STENT INTERVENTION N/A 07/17/2021   Procedure: CORONARY STENT INTERVENTION;  Surgeon: Verlin Lonni BIRCH, MD;  Location: [facility];  Service: Cardiovascular;  Laterality: N/A;   LAPAROSCOPIC APPENDECTOMY  12/02/2011   Procedure: APPENDECTOMY LAPAROSCOPIC;  Surgeon: Redell Faith, DO;  Location: [facility];  Service: General;  Laterality: N/A;   LEFT HEART CATH AND CORONARY ANGIOGRAPHY N/A 07/17/2021   Procedure: LEFT HEART CATH AND CORONARY ANGIOGRAPHY;  Surgeon: Verlin Lonni BIRCH, MD;  Location: [facility];  Service: Cardiovascular;  Laterality: N/A;       Home Medications    Prior to Admission medications   Medication Sig Start Date End Date Taking? Authorizing Provider  ipratropium (ATROVENT ) 0.03 % nasal spray Place 2 sprays into both nostrils every 12 (twelve) hours. 11/05/23  Yes Denaja Verhoeven, Jodi R, NP  promethazine -dextromethorphan (PROMETHAZINE -DM) 6.25-15 MG/5ML syrup Take 5 mLs by mouth 3 (three) times daily as needed for cough. 11/05/23  Yes Bailen Geffre, Jodi R, NP  ALPRAZolam  (XANAX ) 0.25 MG tablet Take 1-2 tablets (0.25-0.5 mg total) by mouth 2 (two) times daily as needed for anxiety. 07/27/23   Levora Reyes SAUNDERS, MD  aspirin  EC 81 MG tablet Take 81 mg by mouth daily. Swallow whole.    [provider]  Evolocumab  (REPATHA  SURECLICK) 140 MG/ML SOAJ Inject 140 mg into the skin every 14 (fourteen) days.  07/16/23   Hilty, Vinie BROCKS, MD  meclizine  (ANTIVERT ) 25 MG tablet Take 1 tablet (25 mg total) by mouth 3 (three) times daily as needed for dizziness. 10/28/23   Levora Reyes SAUNDERS, MD  metoprolol  succinate (TOPROL -XL) 25 MG 24 hr tablet TAKE 1 TABLET (25 MG TOTAL) BY MOUTH DAILY. 07/29/23   Hilty, Vinie BROCKS, MD  pantoprazole  (PROTONIX ) 40 MG tablet TAKE 1 TABLET BY MOUTH EVERY DAY 10/27/23   Kasa, Kurian, MD  rosuvastatin  (CRESTOR ) 20 MG tablet TAKE 1 TABLET BY MOUTH EVERY DAY 07/16/23   Hilty, Vinie BROCKS, MD  sertraline  (ZOLOFT ) 25 MG tablet Take 1 tablet (25 mg total) by mouth daily. 10/28/23   Levora Reyes SAUNDERS, MD    Family History Family History  Problem Relation Age of Onset   Hypertension Mother    Hypertension Father    Anemia Neg Hx    Arrhythmia Neg Hx    Asthma Neg Hx    Clotting disorder Neg Hx    Fainting Neg Hx    Heart attack Neg Hx    Heart disease Neg Hx    Heart failure Neg Hx    Hyperlipidemia Neg Hx    Colon cancer Neg Hx    Esophageal cancer Neg Hx    Stomach cancer Neg Hx    Rectal cancer Neg Hx    Colon polyps Neg Hx     Social History Social History   Tobacco Use   Smoking status: Never    Passive exposure: Never   Smokeless tobacco: Never  Vaping Use   Vaping status: Never Used  Substance Use Topics   Alcohol use: No   Drug use: No     Allergies   Wellbutrin [bupropion hcl]   Review of Systems Review of Systems  Constitutional:  Positive for fatigue.  HENT:  Positive for congestion and sore throat.   Respiratory:  Positive for cough.      Physical Exam Triage Vital Signs ED Triage Vitals  Encounter Vitals Group     BP 11/05/23 1259 125/78     Girls Systolic BP Percentile --      Girls Diastolic BP Percentile --      Boys Systolic BP Percentile --      Boys Diastolic BP Percentile --      Pulse Rate 11/05/23 1259 75     Resp 11/05/23 1259 16     Temp 11/05/23 1259 98.6 F (37 C)     Temp Source 11/05/23 1259 Oral     SpO2 11/05/23 1259 95 %     Weight --      Height --      Head Circumference --      Peak Flow --      Pain Score 11/05/23 1258 5     Pain Loc --      Pain Education --      Exclude from Growth Chart --    No data found.  Updated Vital Signs BP 125/78   Pulse 75   Temp 98.6 F (37 C) (Oral)   Resp 16   SpO2 95%   Visual Acuity Right Eye Distance:   Left Eye Distance:   Bilateral Distance:    Right Eye Near:   Left Eye Near:    Bilateral Near:     Physical Exam Vitals and nursing note  reviewed.  Constitutional:      General: He is not in acute distress.    Appearance: Normal appearance.  He is not ill-appearing or toxic-appearing.  HENT:     Head: Normocephalic and atraumatic.     Right Ear: Tympanic membrane and ear canal normal.     Left Ear: Tympanic membrane and ear canal normal.     Nose: Congestion present.     Mouth/Throat:     Mouth: Mucous membranes are moist.     Pharynx: No oropharyngeal exudate or posterior oropharyngeal erythema.  Eyes:     Pupils: Pupils are equal, round, and reactive to light.  Cardiovascular:     Rate and Rhythm: Normal rate and regular rhythm.     Heart sounds: Normal heart sounds.  Pulmonary:     Effort: Pulmonary effort is normal.     Breath sounds: Normal breath sounds. No wheezing, rhonchi or rales.  Musculoskeletal:     Cervical back: Normal range of motion and neck supple.  Lymphadenopathy:     Cervical: No cervical adenopathy.  Skin:    General: Skin is warm and dry.  Neurological:     General: No focal deficit present.     Mental Status: He is alert and oriented to person, place, and time.  Psychiatric:        Mood and Affect: Mood normal.        Behavior: Behavior normal.      UC Treatments / Results  Labs (all labs ordered are listed, but only abnormal results are displayed) Labs Reviewed - No data to display  EKG   Radiology No results found.  Procedures Procedures (including critical care time)  Medications Ordered in UC Medications - No data to display  Initial Impression / Assessment and Plan / UC Course  I have reviewed the triage vital signs and the nursing notes.  Pertinent labs & imaging results that were available during my care of the patient were reviewed by me and considered in my medical decision making (see chart for details).     Reviewed exam and symptoms with patient.  No red flags.  Patient declined COVID testing.  Discussed viral illness and symptomatic treatment.   Promethazine  DM as needed for cough, side effect profile reviewed.  Atrovent  nasal spray twice daily.  Advise rest fluids and PCP follow-up if symptoms do not improve.  ER precautions reviewed Final Clinical Impressions(s) / UC Diagnoses   Final diagnoses:  Viral illness     Discharge Instructions       Please treat your symptoms with over the counter  tylenol  or ibuprofen , humidifier, and rest.  You may take Promethazine  DM as needed for your cough.  Please note this medication will make you drowsy.  Do not drink alcohol or drive on this medication.  Atrovent  nasal spray twice daily for congestion.  Viral illnesses can last 7-14 days. Please follow up with your PCP if your symptoms are not improving. Please go to the ER for any worsening symptoms. This includes but is not limited to fever you can not control with tylenol  or ibuprofen , you are not able to stay hydrated, you have shortness of breath or chest pain.  Thank you for choosing [facility] for your healthcare needs. I hope you feel better soon!     ED Prescriptions     Medication Sig Dispense Auth. Provider   ipratropium (ATROVENT ) 0.03 % nasal spray Place 2 sprays into both nostrils every 12 (twelve) hours. 30 mL Bryelle Spiewak, Jodi R, NP   promethazine -dextromethorphan (PROMETHAZINE -DM) 6.25-15 MG/5ML syrup Take 5 mLs by mouth 3 (three) times daily as  needed for cough. 118 mL Raeleen Winstanley, Jodi R, NP      PDMP not reviewed this encounter.   Loreda Myla SAUNDERS, NP 11/05/23 1310

## 2023-11-05 NOTE — Discharge Instructions (Addendum)
 Please treat your symptoms with over the counter  tylenol  or ibuprofen , humidifier, and rest.  You may take Promethazine  DM as needed for your cough.  Please note this medication will make you drowsy.  Do not drink alcohol or drive on this medication.  Atrovent  nasal spray twice daily for congestion.  Viral illnesses can last 7-14 days. Please follow up with your PCP if your symptoms are not improving. Please go to the ER for any worsening symptoms. This includes but is not limited to fever you can not control with tylenol  or ibuprofen , you are not able to stay hydrated, you have shortness of breath or chest pain.  Thank you for choosing [facility] for your healthcare needs. I hope you feel better soon!

## 2023-11-05 NOTE — ED Triage Notes (Signed)
 Pt c/o sore throat, productive cough w/yellow mucous in morning, nasal drainage, fatigue and generalized weaknessx5d. Pt states has been taking cough medicine and throat spray, but neither has helped much

## 2023-11-24 ENCOUNTER — Other Ambulatory Visit (HOSPITAL_COMMUNITY): Payer: Self-pay

## 2023-11-25 ENCOUNTER — Telehealth: Payer: Self-pay | Admitting: Pharmacy Technician

## 2023-11-25 NOTE — Telephone Encounter (Signed)
   Pharmacy Patient Advocate Encounter   Received notification from CoverMyMeds that prior authorization for repatha  is required/requested.   Insurance verification completed.   The patient is insured through [facility] MEDICAID .   Per test claim: PA required; PA submitted to above mentioned insurance via Latent Key/confirmation #/EOC [id number] Status is pending

## 2023-11-26 NOTE — Telephone Encounter (Signed)
 Faxed more info to insurance

## 2023-11-30 ENCOUNTER — Other Ambulatory Visit (HOSPITAL_COMMUNITY): Payer: Self-pay

## 2023-11-30 NOTE — Telephone Encounter (Signed)
 Insurance called and they said he has to sign the form stating we can appeal this. They called him and sent him the form that he has to sign

## 2023-12-01 NOTE — Telephone Encounter (Signed)
 I called and left a message on the patients machine to get signature for insurance. Insurance had originally denied due to the labs but I sent updated labs from July.

## 2023-12-04 IMAGING — CR DG CHEST 2V
2 series · 2 of 2 positions shown · non-contrast
Comparison: 06/12/2021

CLINICAL DATA: Chest pain

EXAM:
CHEST - 2 VIEW

[w chest pa]
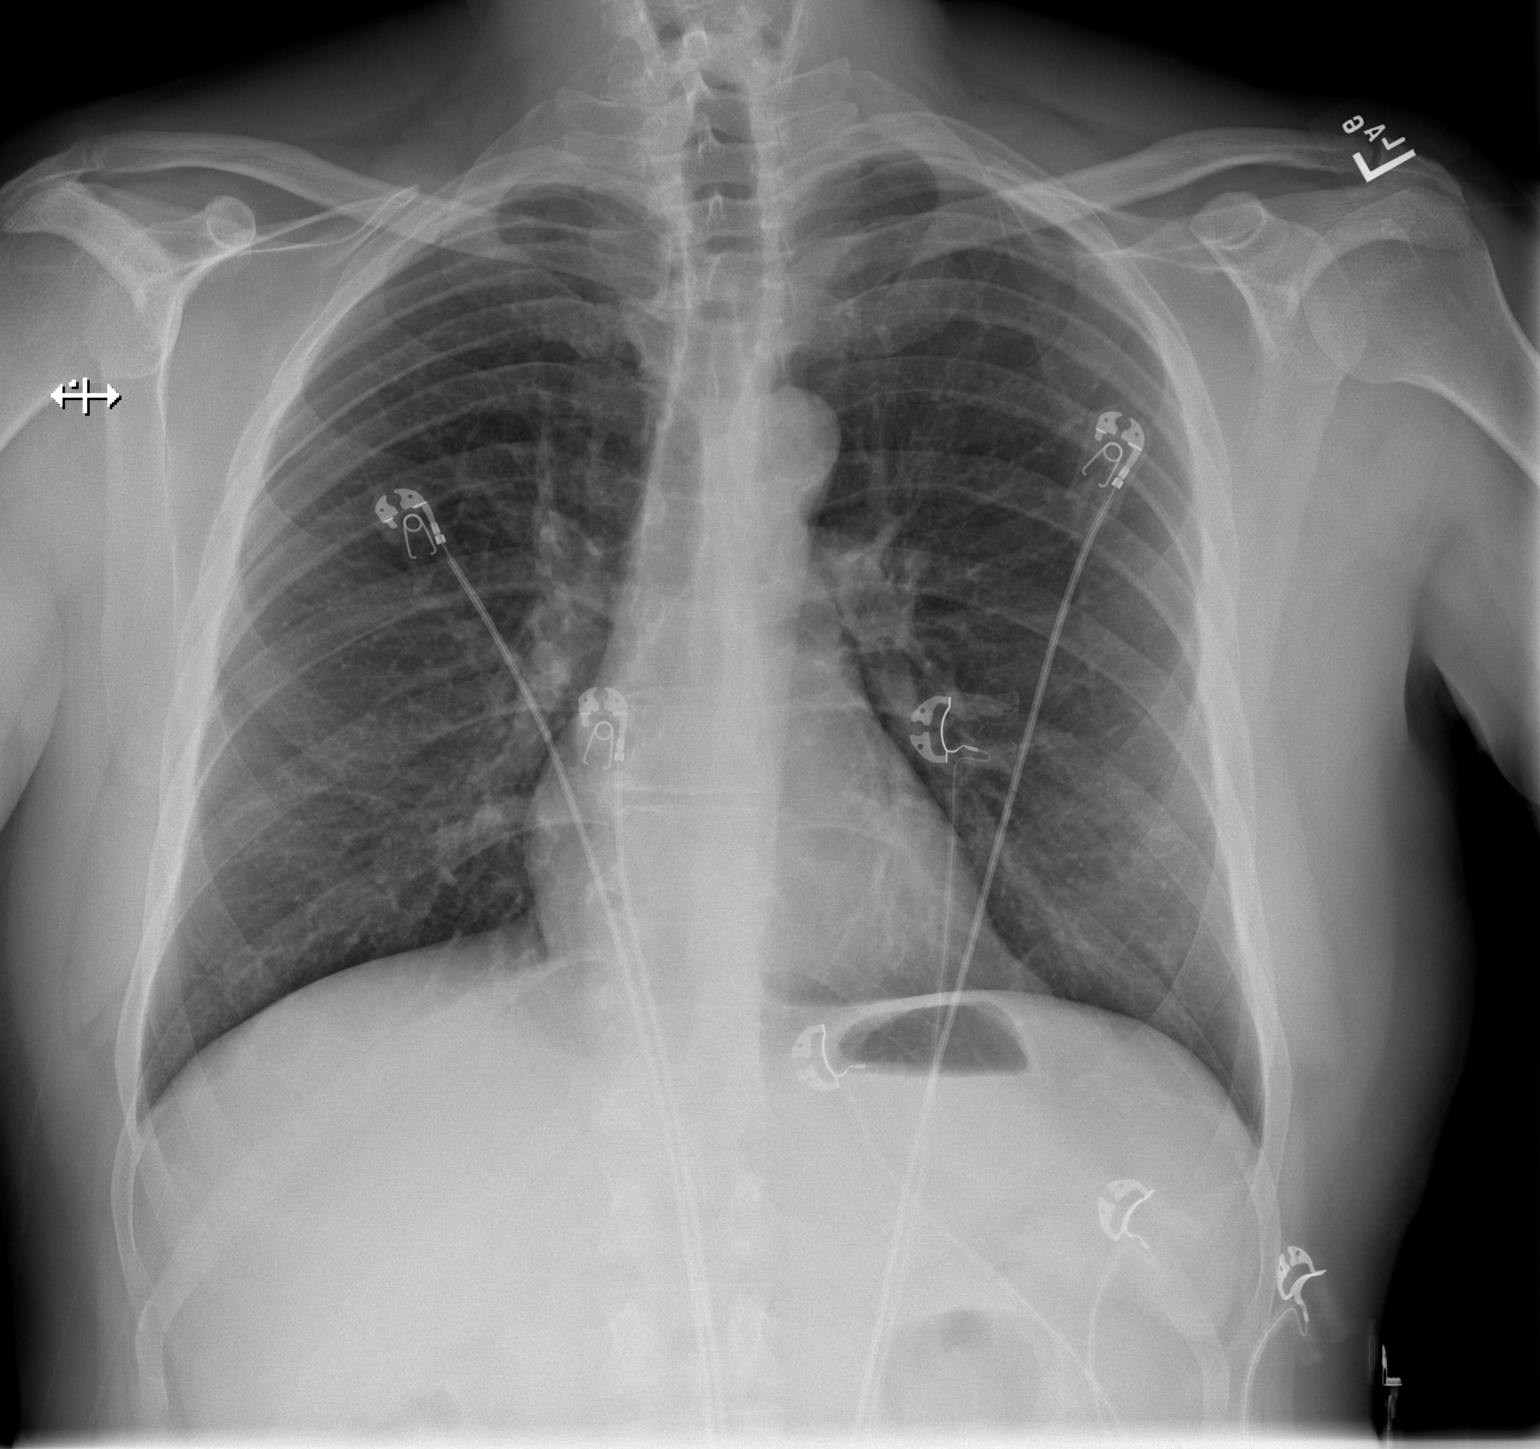

[w chest lat]
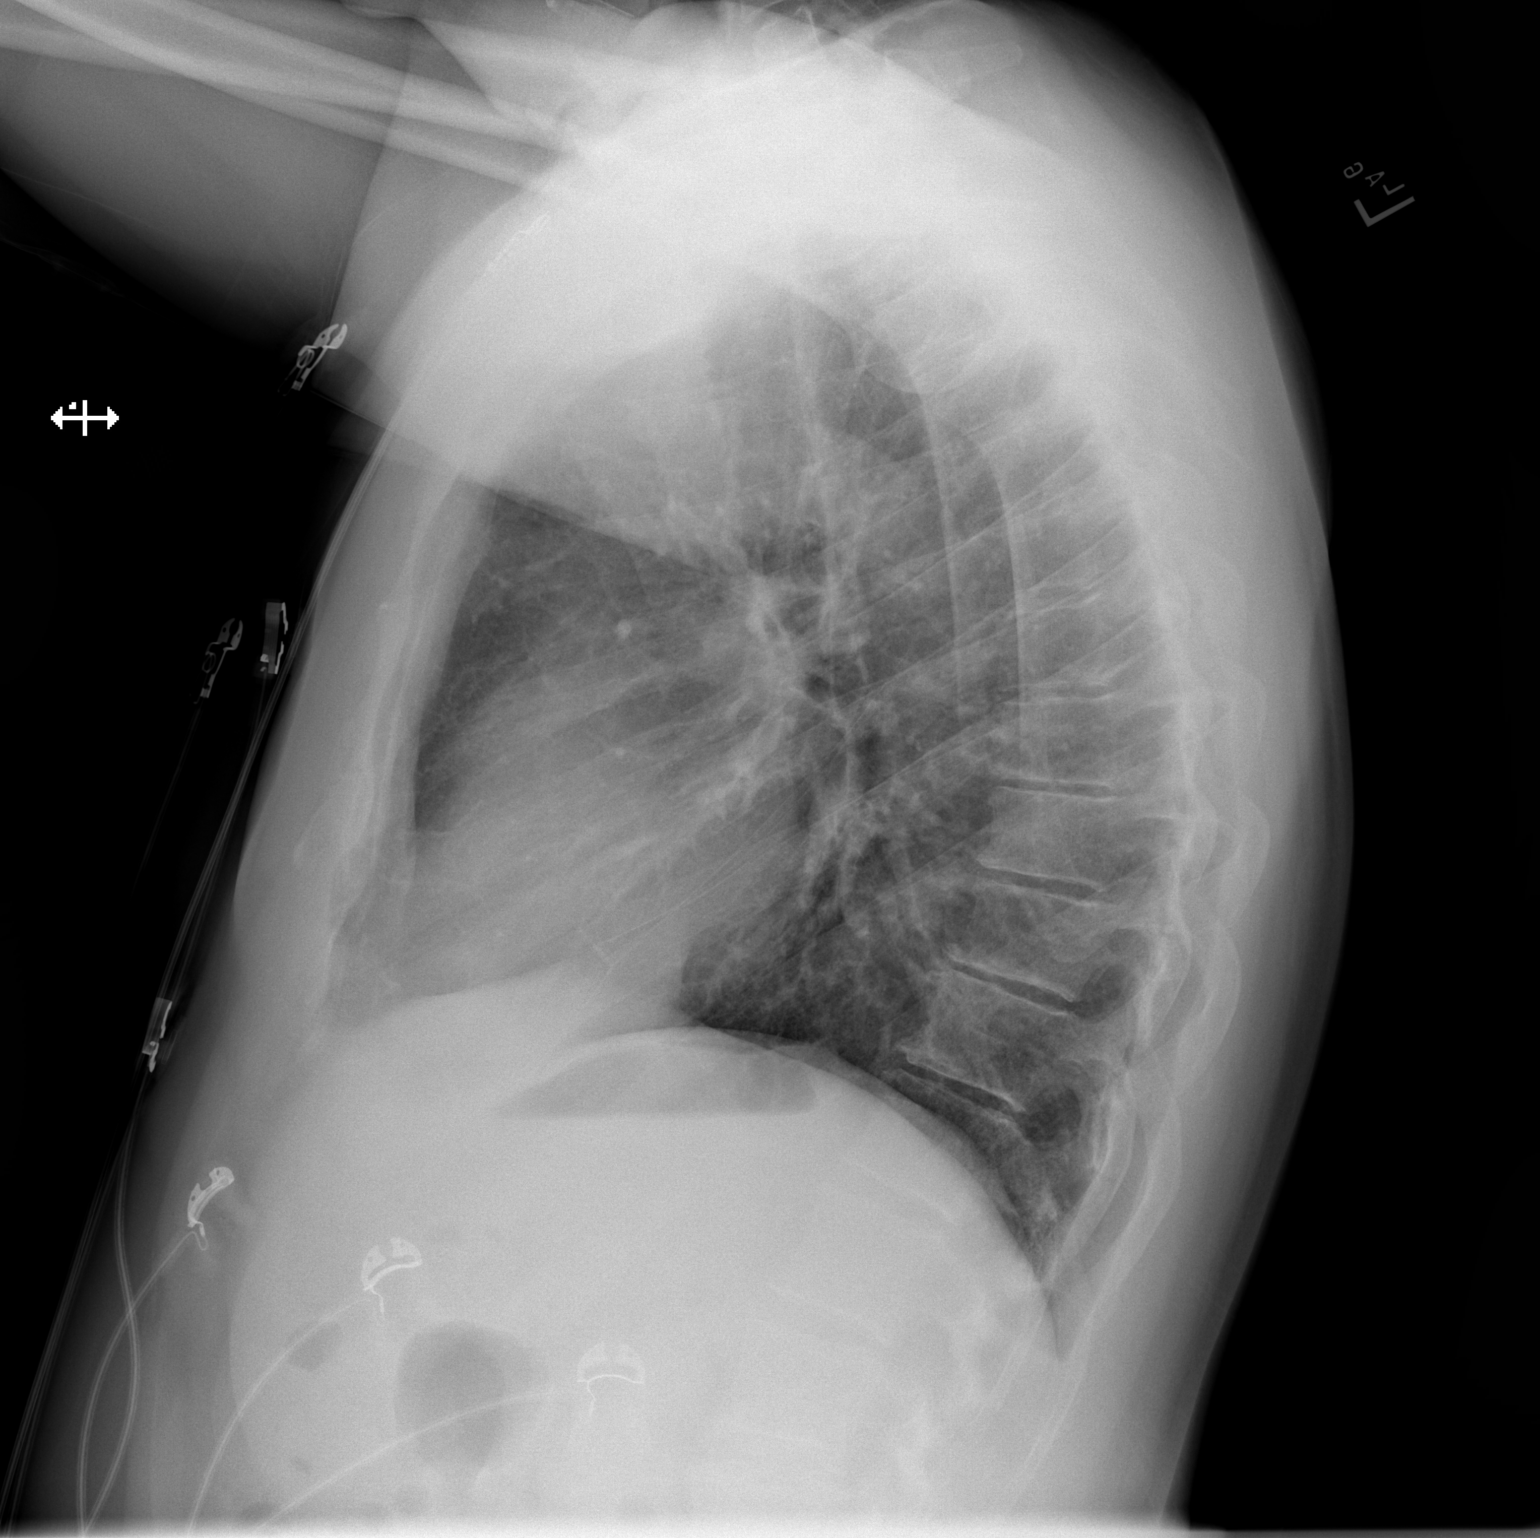

[2 of 2 positions shown; findings below may reference images not displayed]

FINDINGS: The heart size and mediastinal contours are within normal limits.
Both lungs are clear. The visualized skeletal structures are
unremarkable.
IMPRESSION: No active cardiopulmonary disease.

## 2023-12-07 ENCOUNTER — Other Ambulatory Visit (HOSPITAL_COMMUNITY): Payer: Self-pay

## 2023-12-07 NOTE — Telephone Encounter (Signed)
.  I called the patient and left another message.

## 2023-12-09 NOTE — Telephone Encounter (Signed)
 Pt called and said he will be back in town thurs and let us  know

## 2023-12-15 ENCOUNTER — Other Ambulatory Visit (HOSPITAL_COMMUNITY): Payer: Self-pay

## 2023-12-15 NOTE — Telephone Encounter (Signed)
 Lmom for pt to call back.

## 2023-12-18 NOTE — Telephone Encounter (Signed)
 Emailed patient denial so he can sign and send back

## 2023-12-22 ENCOUNTER — Telehealth: Payer: Self-pay | Admitting: Internal Medicine

## 2023-12-22 NOTE — Telephone Encounter (Signed)
 Patient brought in denial documentation to discuss his Repathatha medication.  Patient is here in [place].

## 2023-12-23 NOTE — Telephone Encounter (Signed)
 Patient dropped off the denial but did not sign it. I called the patient and left a message on both numbers

## 2023-12-25 ENCOUNTER — Telehealth: Payer: Self-pay | Admitting: Pharmacy Technician

## 2023-12-25 NOTE — Telephone Encounter (Signed)
 Patient said his pharmacy would not fill the metoprolol  succ 25mg . I called [facility], and they have the rx and was able to fill no issues

## 2023-12-25 NOTE — Telephone Encounter (Signed)
 Faxed appeal with patient's signature to insurance

## 2023-12-28 NOTE — Telephone Encounter (Signed)
 Insurance called and said they received the appeal

## 2023-12-30 ENCOUNTER — Other Ambulatory Visit (HOSPITAL_COMMUNITY): Payer: Self-pay

## 2023-12-30 NOTE — Telephone Encounter (Signed)
 Ins called pt and said it was approved.   Now 0.00 asked pharmacy to fill

## 2024-01-07 ENCOUNTER — Ambulatory Visit (INDEPENDENT_AMBULATORY_CARE_PROVIDER_SITE_OTHER): Admitting: Physician Assistant

## 2024-01-07 ENCOUNTER — Encounter (INDEPENDENT_AMBULATORY_CARE_PROVIDER_SITE_OTHER): Payer: Self-pay | Admitting: Physician Assistant

## 2024-01-07 ENCOUNTER — Ambulatory Visit (INDEPENDENT_AMBULATORY_CARE_PROVIDER_SITE_OTHER): Admitting: Audiology

## 2024-01-07 VITALS — BP 107/68 | HR 58 | Temp 97.9°F | Ht 66.0 in | Wt 169.0 lb

## 2024-01-07 DIAGNOSIS — H903 Sensorineural hearing loss, bilateral: Secondary | ICD-10-CM | POA: Diagnosis not present

## 2024-01-07 DIAGNOSIS — H8102 Meniere's disease, left ear: Secondary | ICD-10-CM | POA: Diagnosis not present

## 2024-01-07 NOTE — Progress Notes (Unsigned)
 Dear Dr. Levora, Here is my assessment for our mutual patient, Todd [name]. Thank you for allowing me the opportunity to care for your patient. Please do not hesitate to contact me should you have any other questions. Sincerely, Chyrl Cohen PA-C  Otolaryngology Clinic Note Referring provider: Dr. Levora HPI:  Todd [name] is a 63 y.o. male kindly referred by Dr. Levora   The patient is a 63 year old gentleman seen in our office for evaluation of vertigo.  The patient notes at the age of 38 he was diagnosed with mumps.  He had abrupt complete loss of hearing in his left ear.  He notes that 2 to 3 years later he started to develop episodes of dizziness, he describes this as room spinning the last 2 to 3 hours.  He was seen by ENT specialist and diagnosed with Mnire's, he was given an unknown medication which seem to improve his frequency and symptoms.  He [place] the [place]  in 1992, he was given given medications and offered surgery for dizziness but told that he would lose hearing in both ears so he did not proceed.  He notes for the last several years he has been getting more reoccurrence of the dizziness.  He also notes that he gets a dizziness with some movements including going from sitting to standing, he feels that when he drives long distances he feels like he is floating after he gets out of his truck.  He notes that occasionally when sleeping on his left side he develops room spinning dizziness, when he turns to the right the spinning goes away.  He does have some ringing in the left ear, sometimes in the right, this increases with stress and in quiet environments.  He denies any neurologic deficits.   Independent Review of Additional Tests or Records:  Audiological evaluation 01/07/2024    Otoscopy: Right ear: Clear external ear canal and notable landmarks visualized on the tympanic membrane. Left ear:  Clear external ear canal and notable landmarks visualized on the tympanic  membrane.   Tympanometry: Right ear: Type A- Normal external ear canal volume with normal middle ear pressure and tympanic membrane compliance. Left ear: Type A- Normal external ear canal volume with normal middle ear pressure and tympanic membrane compliance.   Pure tone Audiometry: Right ear- Normal to moderate sensorineural hearing loss from 125 Hz - 8000 Hz. Left ear-  Profound hearing loss from [phone], Obtained responses were reported to be vibrotactile.      Speech Audiometry: Right ear- Speech Reception Threshold (SRT) was obtained at 20 dBHL. Left ear-Speech Awareness Threshold (SAT) was observed at 105 dBHL.   Word Recognition Score Tested using NU-6 (recorded) Right ear: 96% was obtained at a presentation level of 65 dBHL with contralateral masking which is deemed as  excellent. Left ear: Could not test due to degree of hearing loss.   The hearing test results were completed under headphones and results are deemed to be of good to fair reliability. Test technique:  conventional     Impression: There is a significant difference in pure-tone thresholds between ears, worse in the left.   Recommendations: Follow up with ENT as scheduled for today. Return for a hearing evaluation if concerns with hearing changes arise or per MD recommendation. Consider a communication needs assessment after medical clearance for hearing aids is obtained.   PMH/Meds/All/SocHx/FamHx/ROS:   Past Medical History:  Diagnosis Date   CAD (coronary artery disease)    Complicated migraine  Deafness in left ear    Diverticulosis    External hemorrhoids    Headache    Hypertension    Internal hemorrhoids    Palpitations    Stroke [facility])    TIA (transient ischemic attack)    Tubular adenoma of colon    Vision abnormalities      Past Surgical History:  Procedure Laterality Date   APPENDECTOMY  12/01/11   lap appy   CORONARY STENT INTERVENTION N/A 07/17/2021   Procedure: CORONARY STENT  INTERVENTION;  Surgeon: Verlin Lonni BIRCH, MD;  [facility]: [facility];  Service: Cardiovascular;  Laterality: N/A;   LAPAROSCOPIC APPENDECTOMY  12/02/2011   Procedure: APPENDECTOMY LAPAROSCOPIC;  Surgeon: Redell Faith, DO;  Location: [facility];  Service: General;  Laterality: N/A;   LEFT HEART CATH AND CORONARY ANGIOGRAPHY N/A 07/17/2021   Procedure: LEFT HEART CATH AND CORONARY ANGIOGRAPHY;  Surgeon: Verlin Lonni BIRCH, MD;  Location: [facility];  Service: Cardiovascular;  Laterality: N/A;    Family History  Problem Relation Age of Onset   Hypertension Mother    Hypertension Father    Anemia Neg Hx    Arrhythmia Neg Hx    Asthma Neg Hx    Clotting disorder Neg Hx    Fainting Neg Hx    Heart attack Neg Hx    Heart disease Neg Hx    Heart failure Neg Hx    Hyperlipidemia Neg Hx    Colon cancer Neg Hx    Esophageal cancer Neg Hx    Stomach cancer Neg Hx    Rectal cancer Neg Hx    Colon polyps Neg Hx      Social Connections: Not on file      Current Outpatient Medications:    ALPRAZolam  (XANAX ) 0.25 MG tablet, Take 1-2 tablets (0.25-0.5 mg total) by mouth 2 (two) times daily as needed for anxiety., Disp: 30 tablet, Rfl: 0   aspirin  EC 81 MG tablet, Take 81 mg by mouth daily. Swallow whole., Disp: , Rfl:    Evolocumab  (REPATHA  SURECLICK) 140 MG/ML SOAJ, Inject 140 mg into the skin every 14 (fourteen) days., Disp: 2 mL, Rfl: 5   ipratropium (ATROVENT ) 0.03 % nasal spray, Place 2 sprays into both nostrils every 12 (twelve) hours., Disp: 30 mL, Rfl: 0   meclizine  (ANTIVERT ) 25 MG tablet, Take 1 tablet (25 mg total) by mouth 3 (three) times daily as needed for dizziness., Disp: 30 tablet, Rfl: 0   metoprolol  succinate (TOPROL -XL) 25 MG 24 hr tablet, TAKE 1 TABLET (25 MG TOTAL) BY MOUTH DAILY., Disp: 90 tablet, Rfl: 3   pantoprazole  (PROTONIX ) 40 MG tablet, TAKE 1 TABLET BY MOUTH EVERY DAY, Disp: 30 tablet, Rfl: 1   promethazine -dextromethorphan (PROMETHAZINE -DM)  6.25-15 MG/5ML syrup, Take 5 mLs by mouth 3 (three) times daily as needed for cough., Disp: 118 mL, Rfl: 0   rosuvastatin  (CRESTOR ) 20 MG tablet, TAKE 1 TABLET BY MOUTH EVERY DAY, Disp: 90 tablet, Rfl: 3   sertraline  (ZOLOFT ) 25 MG tablet, Take 1 tablet (25 mg total) by mouth daily., Disp: 90 tablet, Rfl: 1   Physical Exam:   BP 107/68 (BP Location: Right Arm, Patient Position: Sitting)   Pulse (!) 58   Temp 97.9 F (36.6 C) (Oral)   Ht 5' 6 (1.676 m)   Wt 169 lb (76.7 kg)   SpO2 96%   BMI 27.28 kg/m   Pertinent Findings  CN II-XII intact Bilateral EAC clear and TM intact with well pneumatized  middle ear spaces Anterior rhinoscopy: Septum midline; bilateral inferior turbinates with no hypertrophy No lesions of oral cavity/oropharynx; dentition WNL No obviously palpable neck masses/lymphadenopathy/thyromegaly No respiratory distress or stridor  Seprately Identifiable Procedures:  None  Impression & Plans:  Todd [name] is a 63 y.o. male with the following   The patient is a 64 year old gentleman seen in our office for evaluation of dizziness.  He has a diagnosed history of Mnire's.  He has profound hearing loss in his left ear.  He would like to discuss surgical options and ongoing management of Mnire's.  He had previously been treated medically.  I do think he would be best served by seeing a otologist, he will be referred to [facility] for this.  I am happy to see him in the future for any questions or concerns he may have.   - f/u PRN   Thank you for allowing me the opportunity to care for your patient. Please do not hesitate to contact me should you have any other questions.  Sincerely, Chyrl Cohen PA-C [facility] Phone: [phone] Fax: [phone]  01/07/2024, 10:38 AM

## 2024-01-07 NOTE — Progress Notes (Signed)
  [address] [place], [place] [address] [phone]  Audiological Evaluation    Name: Todd [name]     DOB:   08/30/1960      MRN:   [id number]                                                                                     Service Date: 01/07/2024     Accompanied by: unaccompanied   Patient comes today after Reyes Cohen, PA-C sent a referral for a hearing evaluation due to concerns with dizziness.   Symptoms Yes Details  Hearing loss  [x]  Left ear profound loss since he was 63 years old when he recalls being told he had mumps.   Tinnitus  [x]  Left era tinnitus  Ear pain/ infections/pressure  []    Balance problems  [x]  Spinning sensation and was told to have Menire's and with medications he managed it. I it seems he is getting the vertigo again.  Noise exposure history  []    Previous ear surgeries  []    Family history of hearing loss  []    Amplification  []    Other  []      Otoscopy: Right ear: Clear external ear canal and notable landmarks visualized on the tympanic membrane. Left ear:  Clear external ear canal and notable landmarks visualized on the tympanic membrane.  Tympanometry: Right ear: Type A- Normal external ear canal volume with normal middle ear pressure and tympanic membrane compliance. Left ear: Type A- Normal external ear canal volume with normal middle ear pressure and tympanic membrane compliance.  Pure tone Audiometry: Right ear- Normal to moderate sensorineural hearing loss from 125 Hz - 8000 Hz. Left ear-  Profound hearing loss from [phone], Obtained responses were reported to be vibrotactile.     Speech Audiometry: Right ear- Speech Reception Threshold (SRT) was obtained at 20 dBHL. Left ear-Speech Awareness Threshold (SAT) was observed at 105 dBHL.   Word Recognition Score Tested using NU-6 (recorded) Right ear: 96% was obtained at a presentation level of 65 dBHL with contralateral masking which is deemed as   excellent. Left ear: Could not test due to degree of hearing loss.   The hearing test results were completed under headphones and results are deemed to be of good to fair reliability. Test technique:  conventional    Impression: There is a significant difference in pure-tone thresholds between ears, worse in the left.   Recommendations: Follow up with ENT as scheduled for today. Return for a hearing evaluation if concerns with hearing changes arise or per MD recommendation. Consider a communication needs assessment after medical clearance for hearing aids is obtained.   [name], AUD

## 2024-01-07 NOTE — Patient Instructions (Addendum)
 [facility] ENT/Head and Neck Surgery - [facility] [place]  [phone]

## 2024-01-12 ENCOUNTER — Other Ambulatory Visit (HOSPITAL_COMMUNITY): Payer: Self-pay

## 2024-01-13 ENCOUNTER — Other Ambulatory Visit (HOSPITAL_COMMUNITY): Payer: Self-pay

## 2024-01-13 ENCOUNTER — Telehealth (INDEPENDENT_AMBULATORY_CARE_PROVIDER_SITE_OTHER): Payer: Self-pay | Admitting: Physician Assistant

## 2024-01-13 NOTE — Telephone Encounter (Signed)
 The patient called in and attempted to get scheduled and the ENT/ Head/Neck surgery in [place] and the office told him they do not accept electronic referrals and require a phone call

## 2024-01-15 ENCOUNTER — Encounter (INDEPENDENT_AMBULATORY_CARE_PROVIDER_SITE_OTHER): Payer: Self-pay

## 2024-01-18 ENCOUNTER — Ambulatory Visit
Admission: EM | Admit: 2024-01-18 | Discharge: 2024-01-18 | Disposition: A | Discharge status: Home / Self Care | Attending: Family Medicine | Admitting: Family Medicine

## 2024-01-18 DIAGNOSIS — K047 Periapical abscess without sinus: Secondary | ICD-10-CM

## 2024-01-18 MED ORDER — AMOXICILLIN-POT CLAVULANATE 400-57 MG/5ML PO SUSR: 875.0000 mg | Freq: Two times a day (BID) | ORAL | 0 refills | Status: AC

## 2024-01-18 NOTE — Discharge Instructions (Addendum)
 Start Augmentin  twice daily for 7 days.  You may take over-the-counter Tylenol  or ibuprofen  as needed for pain.  Please follow-up with the dentist as soon as possible for further treatment options.  Please go to the ER if you develop any worsening symptoms such as fever, facial swelling, or any new concerns that arise.  Hope you feel better soon!

## 2024-01-18 NOTE — ED Triage Notes (Signed)
 Pt present with c/o possible tooth infection. Pt states he has had dental pain since yesterday. Reports swelling.  Home interventions: Ibuprofen 

## 2024-01-18 NOTE — ED Provider Notes (Addendum)
 [facility]    CSN: [id number] Arrival date & time: 01/18/24  1150      History   Chief Complaint Chief Complaint  Patient presents with   Dental Pain    HPI Todd [name] is a 63 y.o. male presents for dental infection.  Patient reports yesterday he developed pain and swelling above his front tooth.  States he had an injury to the tooth a while ago and has had 1 infection of the area previously.  Does endorse some mild upper lip swelling but denies any fevers, drainage from around the tooth, chills.  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  He has been taking ibuprofen  OTC.  No other concerns at this time.   Dental Pain   Past Medical History:  Diagnosis Date   CAD (coronary artery disease)    Complicated migraine    Deafness in left ear    Diverticulosis    External hemorrhoids    Headache    Hypertension    Internal hemorrhoids    Palpitations    Stroke [facility])    TIA (transient ischemic attack)    Tubular adenoma of colon    Vision abnormalities     Patient Active Problem List   Diagnosis Date Noted   Tonsillar abscess 02/24/2023   Sepsis (HCC) 02/24/2023   Hyperbilirubinemia 02/24/2023   Essential hypertension 02/24/2023   Hearing loss of left ear 06/02/2022   Chronic anticoagulation 08/26/2021   Internal hemorrhoid, bleeding 08/26/2021   Prolapsed internal hemorrhoids, grade 3 08/26/2021   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 coronary artery disease 08/26/2021   NSTEMI (non-ST elevated myocardial infarction) (HCC) 07/20/2021   CAD (coronary artery disease) 07/18/2021   Hyperlipidemia 07/18/2021   Unstable angina (HCC)    Non-recurrent acute serous otitis media of right ear 10/14/2018   Shortness of breath 11/12/2016   Chest pain 11/12/2016   Heart palpitations 10/20/2013   Situational anxiety 10/20/2013   Migraine with visual aura 08/24/2013    TIA (transient ischemic attack) 08/23/2013   Blurred vision 08/23/2013   Anomia 08/23/2013    Past Surgical History:  Procedure Laterality Date   APPENDECTOMY  12/01/11   lap appy   CORONARY STENT INTERVENTION N/A 07/17/2021   Procedure: CORONARY STENT INTERVENTION;  Surgeon: Verlin Lonni BIRCH, MD;  [facility];  Service: Cardiovascular;  Laterality: N/A;   LAPAROSCOPIC APPENDECTOMY  12/02/2011   Procedure: APPENDECTOMY LAPAROSCOPIC;  Surgeon: Redell Faith, DO;  Location: [facility];  Service: General;  Laterality: N/A;   LEFT HEART CATH AND CORONARY ANGIOGRAPHY N/A 07/17/2021   Procedure: LEFT HEART CATH AND CORONARY ANGIOGRAPHY;  Surgeon: Verlin Lonni BIRCH, MD;  Location[facility];  Service: Cardiovascular;  Laterality: N/A;       Home Medications    Prior to Admission medications   Medication Sig Start Date End Date Taking? Authorizing Provider  amoxicillin -clavulanate (AUGMENTIN ) 400-57 MG/5ML suspension Take 10.9 mLs (875 mg total) by mouth 2 (two) times daily for 7 days. 01/18/24 01/25/24 Yes Jaterrius Ricketson, Jodi R, NP  ALPRAZolam  (XANAX ) 0.25 MG tablet Take 1-2 tablets (0.25-0.5 mg total) by mouth 2 (two) times daily as needed for anxiety. 07/27/23   Levora Reyes SAUNDERS, MD  aspirin  EC 81 MG tablet Take 81 mg by mouth daily. Swallow whole.    [provider]  Evolocumab  (REPATHA  SURECLICK) 140 MG/ML SOAJ Inject  140 mg into the skin every 14 (fourteen) days. 07/16/23   Hilty, Vinie BROCKS, MD  ipratropium (ATROVENT ) 0.03 % nasal spray Place 2 sprays into both nostrils every 12 (twelve) hours. 11/05/23   Pradeep Beaubrun, Jodi R, NP  meclizine  (ANTIVERT ) 25 MG tablet Take 1 tablet (25 mg total) by mouth 3 (three) times daily as needed for dizziness. 10/28/23   Levora Reyes SAUNDERS, MD  metoprolol  succinate (TOPROL -XL) 25 MG 24 hr tablet TAKE 1 TABLET (25 MG TOTAL) BY MOUTH DAILY. 07/29/23   Hilty, Vinie BROCKS, MD  pantoprazole  (PROTONIX ) 40 MG tablet TAKE 1 TABLET BY MOUTH EVERY  DAY 10/27/23   Kasa, Kurian, MD  promethazine -dextromethorphan (PROMETHAZINE -DM) 6.25-15 MG/5ML syrup Take 5 mLs by mouth 3 (three) times daily as needed for cough. 11/05/23   Kaiea Esselman, Jodi R, NP  rosuvastatin  (CRESTOR ) 20 MG tablet TAKE 1 TABLET BY MOUTH EVERY DAY 07/16/23   Hilty, Vinie BROCKS, MD  sertraline  (ZOLOFT ) 25 MG tablet Take 1 tablet (25 mg total) by mouth daily. 10/28/23   Levora Reyes SAUNDERS, MD    Family History Family History  Problem Relation Age of Onset   Hypertension Mother    Hypertension Father    Anemia Neg Hx    Arrhythmia Neg Hx    Asthma Neg Hx    Clotting disorder Neg Hx    Fainting Neg Hx    Heart attack Neg Hx    Heart disease Neg Hx    Heart failure Neg Hx    Hyperlipidemia Neg Hx    Colon cancer Neg Hx    Esophageal cancer Neg Hx    Stomach cancer Neg Hx    Rectal cancer Neg Hx    Colon polyps Neg Hx     Social History Social History   Tobacco Use   Smoking status: Never    Passive exposure: Never   Smokeless tobacco: Never  Vaping Use   Vaping status: Never Used  Substance Use Topics   Alcohol use: No   Drug use: No     Allergies   Wellbutrin [bupropion hcl]   Review of Systems Review of Systems  HENT:  Positive for dental problem.      Physical Exam Triage Vital Signs ED Triage Vitals [01/18/24 1201]  Encounter Vitals Group     BP 122/78     Girls Systolic BP Percentile      Girls Diastolic BP Percentile      Boys Systolic BP Percentile      Boys Diastolic BP Percentile      Pulse Rate (!) 57     Resp 16     Temp 97.9 F (36.6 C)     Temp Source Oral     SpO2 95 %     Weight      Height      Head Circumference      Peak Flow      Pain Score 6     Pain Loc      Pain Education      Exclude from Growth Chart    No data found.  Updated Vital Signs BP 122/78 (BP Location: Right Arm)   Pulse (!) 57   Temp 97.9 F (36.6 C) (Oral)   Resp 16   SpO2 95%   Visual Acuity Right Eye Distance:   Left Eye Distance:    Bilateral Distance:    Right Eye Near:   Left Eye Near:    Bilateral Near:  Physical Exam Vitals and nursing note reviewed.  Constitutional:      General: He is not in acute distress.    Appearance: Normal appearance. He is not ill-appearing.  HENT:     Head: Normocephalic and atraumatic.     Mouth/Throat:      Comments: There is mild swelling of the gumline above tooth #9 with some gum recession.  No palpable abscess.  There is trace swelling of the upper lip without any facial swelling Eyes:     Pupils: Pupils are equal, round, and reactive to light.  Cardiovascular:     Rate and Rhythm: Bradycardia present.  Pulmonary:     Effort: Pulmonary effort is normal.  Skin:    General: Skin is warm and dry.  Neurological:     General: No focal deficit present.     Mental Status: He is alert and oriented to person, place, and time.      UC Treatments / Results  Labs (all labs ordered are listed, but only abnormal results are displayed) Labs Reviewed - No data to display   Basic metabolic panel with GFR Order: [id number]  Status: Final result     Next appt: 04/29/2024 at 10:00 AM in Family Medicine Katha JONELLE Pines, MD)     Dx: Hypokalemia   Test Result Released: Yes (seen)     Messages: Seen   1 Result Note     1 Patient Communication     View Follow-Up Encounter          Component Ref Range & Units (hover) 4 mo ago (09/09/23) 6 mo ago (07/11/23) 6 mo ago (06/25/23) 7 mo ago (05/29/23) 10 mo ago (03/14/23) 10 mo ago (02/25/23) 10 mo ago (02/24/23)  Sodium 139 138 R 139 R 140 R 140 R 136 R 137 R  Potassium 4.3 3.4 Low  R 4.6 R 4.1 R 4.3 R 4.5 R 4.3 R  Chloride 106 107 R 107 R 107 R 109 R 107 R 105 R  CO2 29 23 R 26 R 22 R 23 R 21 Low  R 24 R  Glucose, Bld 97 98 CM 118 High  CM 112 High  CM 114 High  CM 148 High  CM 115 High  CM  BUN 13 17 R 13 R 18 R 15 R 15 R 11 R  Creatinine, Ser 0.94 0.96 R 0.94 R 1.41 High  R 1.06 R 0.92 R 0.94 R  GFR 87.57         Comment: Calculated using the CKD-EPI Creatinine Equation (2021)  Calcium  9.2 9.2 R 8.8 Low  R 9.1 R 9.2 R 8.9 R 9.2 R  Resulting Agency  [facility]        Specimen Collected: 09/09/23 14:34 Last Resulted: 09/10/23 14:13   EKG   Radiology No results found.  Procedures Procedures (including critical care time)  Medications Ordered in UC Medications - No data to display  Initial Impression / Assessment and Plan / UC Course  I have reviewed the triage vital signs and the nursing notes.  Pertinent labs & imaging results that were available during my care of the patient were reviewed by me and considered in my medical decision making (see chart for details).     Will start Augmentin  twice daily for 7 days, patient requested liquid.  He will continue OTC analgesics as needed.  He states he  will be following up with his dentist in [place]  when he returns there in 3 weeks.  Strict ER precautions were reviewed and patient verbalized understanding. Final Clinical Impressions(s) / UC Diagnoses   Final diagnoses:  Dental infection     Discharge Instructions      Start Augmentin  twice daily for 7 days.  You may take over-the-counter Tylenol  or ibuprofen  as needed for pain.  Please follow-up with the dentist as soon as possible for further treatment options.  Please go to the ER if you develop any worsening symptoms such as fever, facial swelling, or any new concerns that arise.  Hope you feel better soon!    ED Prescriptions     Medication Sig Dispense Auth. Provider   amoxicillin -clavulanate (AUGMENTIN ) 400-57 MG/5ML suspension Take 10.9 mLs (875 mg total) by mouth 2 (two) times daily for 7 days. 152.6 mL Lavena Loretto, Jodi R, NP      PDMP not reviewed this encounter.   Loreda Myla SAUNDERS, NP 01/18/24 1222    Loreda Myla SAUNDERS, NP 01/18/24 [phone]

## 2024-02-16 ENCOUNTER — Other Ambulatory Visit (HOSPITAL_COMMUNITY): Payer: Self-pay

## 2024-03-14 ENCOUNTER — Other Ambulatory Visit (HOSPITAL_COMMUNITY): Payer: Self-pay

## 2024-03-14 ENCOUNTER — Other Ambulatory Visit: Payer: Self-pay | Admitting: Internal Medicine

## 2024-03-14 DIAGNOSIS — E785 Hyperlipidemia, unspecified: Secondary | ICD-10-CM

## 2024-03-14 DIAGNOSIS — I2510 Atherosclerotic heart disease of native coronary artery without angina pectoris: Secondary | ICD-10-CM

## 2024-03-14 MED ORDER — REPATHA SURECLICK 140 MG/ML ~~LOC~~ SOAJ
140.0000 mg | SUBCUTANEOUS | 5 refills | Status: AC
  Filled 2024-03-14: qty 2, 28d supply, fill #0
  Filled 2024-04-11: qty 2, 28d supply, fill #1

## 2024-04-11 ENCOUNTER — Other Ambulatory Visit (HOSPITAL_COMMUNITY): Payer: Self-pay

## 2024-04-29 ENCOUNTER — Ambulatory Visit: Admitting: Family Medicine

## 2024-07-04 ENCOUNTER — Ambulatory Visit: Admitting: Internal Medicine
